# Patient Record
Sex: Male | Born: 1935 | Race: White | Hispanic: No | Marital: Married | Smoking: Former smoker
Health system: Southern US, Community
[De-identification: ages and names within clinical notes are randomized; demographics above are authoritative.]

## PROBLEM LIST (undated history)

## (undated) DIAGNOSIS — I272 Pulmonary hypertension, unspecified: Secondary | ICD-10-CM

## (undated) DIAGNOSIS — J449 Chronic obstructive pulmonary disease, unspecified: Secondary | ICD-10-CM

## (undated) DIAGNOSIS — K219 Gastro-esophageal reflux disease without esophagitis: Secondary | ICD-10-CM

## (undated) DIAGNOSIS — IMO0002 Reserved for concepts with insufficient information to code with codable children: Secondary | ICD-10-CM

## (undated) DIAGNOSIS — I4891 Unspecified atrial fibrillation: Secondary | ICD-10-CM

## (undated) DIAGNOSIS — I071 Rheumatic tricuspid insufficiency: Secondary | ICD-10-CM

## (undated) DIAGNOSIS — N401 Enlarged prostate with lower urinary tract symptoms: Secondary | ICD-10-CM

## (undated) DIAGNOSIS — I2510 Atherosclerotic heart disease of native coronary artery without angina pectoris: Secondary | ICD-10-CM

## (undated) DIAGNOSIS — N138 Other obstructive and reflux uropathy: Secondary | ICD-10-CM

## (undated) DIAGNOSIS — C439 Malignant melanoma of skin, unspecified: Secondary | ICD-10-CM

## (undated) DIAGNOSIS — I4510 Unspecified right bundle-branch block: Secondary | ICD-10-CM

## (undated) DIAGNOSIS — J189 Pneumonia, unspecified organism: Secondary | ICD-10-CM

## (undated) DIAGNOSIS — M1990 Unspecified osteoarthritis, unspecified site: Secondary | ICD-10-CM

## (undated) DIAGNOSIS — E785 Hyperlipidemia, unspecified: Secondary | ICD-10-CM

## (undated) DIAGNOSIS — R9430 Abnormal result of cardiovascular function study, unspecified: Secondary | ICD-10-CM

## (undated) DIAGNOSIS — R972 Elevated prostate specific antigen [PSA]: Secondary | ICD-10-CM

## (undated) DIAGNOSIS — F5104 Psychophysiologic insomnia: Secondary | ICD-10-CM

## (undated) DIAGNOSIS — I872 Venous insufficiency (chronic) (peripheral): Secondary | ICD-10-CM

## (undated) DIAGNOSIS — J209 Acute bronchitis, unspecified: Secondary | ICD-10-CM

## (undated) DIAGNOSIS — J986 Disorders of diaphragm: Secondary | ICD-10-CM

## (undated) DIAGNOSIS — I050 Rheumatic mitral stenosis: Secondary | ICD-10-CM

## (undated) DIAGNOSIS — I350 Nonrheumatic aortic (valve) stenosis: Secondary | ICD-10-CM

## (undated) DIAGNOSIS — K5730 Diverticulosis of large intestine without perforation or abscess without bleeding: Secondary | ICD-10-CM

## (undated) DIAGNOSIS — I10 Essential (primary) hypertension: Secondary | ICD-10-CM

## (undated) DIAGNOSIS — R609 Edema, unspecified: Secondary | ICD-10-CM

## (undated) DIAGNOSIS — I4892 Unspecified atrial flutter: Secondary | ICD-10-CM

## (undated) HISTORY — DX: Disorders of diaphragm: J98.6

## (undated) HISTORY — DX: Diverticulosis of large intestine without perforation or abscess without bleeding: K57.30

## (undated) HISTORY — DX: Unspecified atrial flutter: I48.92

## (undated) HISTORY — DX: Rheumatic tricuspid insufficiency: I07.1

## (undated) HISTORY — DX: Other obstructive and reflux uropathy: N13.8

## (undated) HISTORY — DX: Benign prostatic hyperplasia with lower urinary tract symptoms: N40.1

## (undated) HISTORY — DX: Venous insufficiency (chronic) (peripheral): I87.2

## (undated) HISTORY — DX: Unspecified osteoarthritis, unspecified site: M19.90

## (undated) HISTORY — DX: Abnormal result of cardiovascular function study, unspecified: R94.30

## (undated) HISTORY — DX: Malignant melanoma of skin, unspecified: C43.9

## (undated) HISTORY — DX: Psychophysiologic insomnia: F51.04

## (undated) HISTORY — DX: Chronic obstructive pulmonary disease, unspecified: J44.9

## (undated) HISTORY — DX: Unspecified right bundle-branch block: I45.10

## (undated) HISTORY — DX: Hyperlipidemia, unspecified: E78.5

## (undated) HISTORY — DX: Rheumatic mitral stenosis: I05.0

## (undated) HISTORY — DX: Acute bronchitis, unspecified: J20.9

## (undated) HISTORY — DX: Reserved for concepts with insufficient information to code with codable children: IMO0002

## (undated) HISTORY — DX: Pulmonary hypertension, unspecified: I27.20

## (undated) HISTORY — DX: Edema, unspecified: R60.9

## (undated) HISTORY — DX: Elevated prostate specific antigen (PSA): R97.20

## (undated) HISTORY — DX: Nonrheumatic aortic (valve) stenosis: I35.0

## (undated) HISTORY — PX: OTHER SURGICAL HISTORY: SHX169

## (undated) HISTORY — PX: UMBILICAL HERNIA REPAIR: SHX196

## (undated) HISTORY — DX: Essential (primary) hypertension: I10

## (undated) HISTORY — DX: Gastro-esophageal reflux disease without esophagitis: K21.9

## (undated) HISTORY — DX: Atherosclerotic heart disease of native coronary artery without angina pectoris: I25.10

---

## 1999-05-02 ENCOUNTER — Other Ambulatory Visit: Admission: RE | Admit: 1999-05-02 | Discharge: 1999-05-02 | Payer: Self-pay | Admitting: Urology

## 2001-07-16 ENCOUNTER — Encounter: Payer: Self-pay | Admitting: Emergency Medicine

## 2001-07-16 ENCOUNTER — Emergency Department (HOSPITAL_COMMUNITY): Admission: EM | Admit: 2001-07-16 | Discharge: 2001-07-16 | Payer: Self-pay | Admitting: Emergency Medicine

## 2001-08-20 ENCOUNTER — Emergency Department (HOSPITAL_COMMUNITY): Admission: EM | Admit: 2001-08-20 | Discharge: 2001-08-20 | Payer: Self-pay | Admitting: Emergency Medicine

## 2001-08-31 ENCOUNTER — Ambulatory Visit (HOSPITAL_BASED_OUTPATIENT_CLINIC_OR_DEPARTMENT_OTHER): Admission: RE | Admit: 2001-08-31 | Discharge: 2001-09-01 | Payer: Self-pay | Admitting: *Deleted

## 2002-01-03 ENCOUNTER — Encounter: Payer: Self-pay | Admitting: Pulmonary Disease

## 2002-01-03 ENCOUNTER — Ambulatory Visit (HOSPITAL_COMMUNITY): Admission: RE | Admit: 2002-01-03 | Discharge: 2002-01-03 | Payer: Self-pay | Admitting: Pulmonary Disease

## 2002-02-08 ENCOUNTER — Ambulatory Visit (HOSPITAL_COMMUNITY): Admission: RE | Admit: 2002-02-08 | Discharge: 2002-02-08 | Payer: Self-pay | Admitting: *Deleted

## 2002-02-08 ENCOUNTER — Encounter: Payer: Self-pay | Admitting: *Deleted

## 2002-03-06 ENCOUNTER — Ambulatory Visit (HOSPITAL_COMMUNITY): Admission: RE | Admit: 2002-03-06 | Discharge: 2002-03-08 | Payer: Self-pay | Admitting: Cardiology

## 2002-03-07 ENCOUNTER — Encounter: Payer: Self-pay | Admitting: Cardiology

## 2002-03-07 ENCOUNTER — Encounter: Payer: Self-pay | Admitting: Internal Medicine

## 2002-04-12 ENCOUNTER — Encounter: Payer: Self-pay | Admitting: Pulmonary Disease

## 2002-04-12 ENCOUNTER — Ambulatory Visit (HOSPITAL_COMMUNITY): Admission: RE | Admit: 2002-04-12 | Discharge: 2002-04-12 | Payer: Self-pay | Admitting: Pulmonary Disease

## 2004-06-09 ENCOUNTER — Ambulatory Visit: Payer: Self-pay | Admitting: Pulmonary Disease

## 2004-06-23 ENCOUNTER — Ambulatory Visit: Payer: Self-pay | Admitting: Pulmonary Disease

## 2004-07-07 ENCOUNTER — Ambulatory Visit: Payer: Self-pay | Admitting: Pulmonary Disease

## 2004-08-18 ENCOUNTER — Ambulatory Visit: Payer: Self-pay | Admitting: Pulmonary Disease

## 2004-09-01 ENCOUNTER — Ambulatory Visit: Payer: Self-pay | Admitting: Cardiology

## 2004-09-23 ENCOUNTER — Ambulatory Visit: Payer: Self-pay | Admitting: Cardiology

## 2004-09-30 ENCOUNTER — Ambulatory Visit: Payer: Self-pay | Admitting: Cardiology

## 2004-10-13 ENCOUNTER — Ambulatory Visit: Payer: Self-pay | Admitting: Pulmonary Disease

## 2005-01-08 ENCOUNTER — Ambulatory Visit: Payer: Self-pay | Admitting: Cardiology

## 2005-01-22 ENCOUNTER — Ambulatory Visit: Payer: Self-pay | Admitting: Pulmonary Disease

## 2005-04-27 ENCOUNTER — Ambulatory Visit: Payer: Self-pay | Admitting: Pulmonary Disease

## 2005-07-21 ENCOUNTER — Ambulatory Visit: Payer: Self-pay | Admitting: Pulmonary Disease

## 2005-07-29 ENCOUNTER — Ambulatory Visit: Payer: Self-pay | Admitting: Pulmonary Disease

## 2006-01-11 ENCOUNTER — Ambulatory Visit: Payer: Self-pay | Admitting: Cardiology

## 2006-01-18 ENCOUNTER — Ambulatory Visit: Payer: Self-pay | Admitting: Pulmonary Disease

## 2006-05-19 ENCOUNTER — Ambulatory Visit: Payer: Self-pay | Admitting: Pulmonary Disease

## 2006-07-16 ENCOUNTER — Ambulatory Visit: Payer: Self-pay | Admitting: Pulmonary Disease

## 2006-07-20 ENCOUNTER — Ambulatory Visit: Payer: Self-pay | Admitting: Pulmonary Disease

## 2006-07-20 LAB — CONVERTED CEMR LAB
ALT: 20 units/L (ref 0–40)
AST: 22 units/L (ref 0–37)
Albumin: 3.4 g/dL — ABNORMAL LOW (ref 3.5–5.2)
Alkaline Phosphatase: 57 units/L (ref 39–117)
BUN: 20 mg/dL (ref 6–23)
Basophils Absolute: 0 10*3/uL (ref 0.0–0.1)
Basophils Relative: 0.1 % (ref 0.0–1.0)
CO2: 25 meq/L (ref 19–32)
Calcium: 8.6 mg/dL (ref 8.4–10.5)
Chloride: 104 meq/L (ref 96–112)
Chol/HDL Ratio, serum: 3.7
Cholesterol: 154 mg/dL (ref 0–200)
Creatinine, Ser: 0.9 mg/dL (ref 0.4–1.5)
Eosinophil percent: 4.9 % (ref 0.0–5.0)
GFR calc non Af Amer: 88 mL/min
Glomerular Filtration Rate, Af Am: 107 mL/min/{1.73_m2}
Glucose, Bld: 91 mg/dL (ref 70–99)
HCT: 46.8 % (ref 39.0–52.0)
HDL: 42.1 mg/dL (ref >39.0)
Hemoglobin: 15.7 g/dL (ref 13.0–17.0)
LDL Cholesterol: 105 mg/dL — ABNORMAL HIGH (ref 0–99)
Lymphocytes Relative: 17.2 % (ref 12.0–46.0)
MCHC: 33.6 g/dL (ref 30.0–36.0)
MCV: 88.9 fL (ref 78.0–100.0)
Monocytes Absolute: 0.7 10*3/uL (ref 0.2–0.7)
Monocytes Relative: 13.1 % — ABNORMAL HIGH (ref 3.0–11.0)
Neutro Abs: 3.2 10*3/uL (ref 1.4–7.7)
Neutrophils Relative %: 64.7 % (ref 43.0–77.0)
PSA: 7.59 ng/mL — ABNORMAL HIGH (ref 0.10–4.00)
Platelets: 189 10*3/uL (ref 150–400)
Potassium: 3.9 meq/L (ref 3.5–5.1)
RBC: 5.27 M/uL (ref 4.22–5.81)
RDW: 12.5 % (ref 11.5–14.6)
Sodium: 136 meq/L (ref 135–145)
TSH: 4.47 microintl units/mL (ref 0.35–5.50)
Total Bilirubin: 0.9 mg/dL (ref 0.3–1.2)
Total Protein: 5.7 g/dL — ABNORMAL LOW (ref 6.0–8.3)
Triglyceride fasting, serum: 36 mg/dL (ref 0–149)
VLDL: 7 mg/dL (ref 0–40)
WBC: 5 10*3/uL (ref 4.5–10.5)

## 2006-09-09 ENCOUNTER — Ambulatory Visit: Payer: Self-pay | Admitting: Pulmonary Disease

## 2006-09-20 ENCOUNTER — Ambulatory Visit: Payer: Self-pay | Admitting: Pulmonary Disease

## 2006-10-05 ENCOUNTER — Ambulatory Visit: Payer: Self-pay | Admitting: Pulmonary Disease

## 2007-01-11 ENCOUNTER — Ambulatory Visit: Payer: Self-pay | Admitting: Cardiology

## 2007-01-17 ENCOUNTER — Ambulatory Visit: Payer: Self-pay | Admitting: Pulmonary Disease

## 2007-01-18 ENCOUNTER — Ambulatory Visit: Payer: Self-pay | Admitting: Pulmonary Disease

## 2007-01-18 LAB — CONVERTED CEMR LAB
ALT: 22 units/L (ref 0–40)
AST: 22 units/L (ref 0–37)
Albumin: 3.6 g/dL (ref 3.5–5.2)
Alkaline Phosphatase: 51 units/L (ref 39–117)
BUN: 14 mg/dL (ref 6–23)
Basophils Absolute: 0 10*3/uL (ref 0.0–0.1)
Basophils Relative: 0.1 % (ref 0.0–1.0)
Bilirubin, Direct: 0.2 mg/dL (ref 0.0–0.3)
CO2: 29 meq/L (ref 19–32)
Calcium: 9 mg/dL (ref 8.4–10.5)
Chloride: 105 meq/L (ref 96–112)
Cholesterol: 180 mg/dL (ref 0–200)
Creatinine, Ser: 0.7 mg/dL (ref 0.4–1.5)
Eosinophils Absolute: 0.2 10*3/uL (ref 0.0–0.6)
Eosinophils Relative: 3.7 % (ref 0.0–5.0)
GFR calc Af Amer: 143 mL/min
GFR calc non Af Amer: 118 mL/min
Glucose, Bld: 93 mg/dL (ref 70–99)
HCT: 43.7 % (ref 39.0–52.0)
HDL: 52 mg/dL (ref >39.0)
Hemoglobin: 15 g/dL (ref 13.0–17.0)
LDL Cholesterol: 121 mg/dL — ABNORMAL HIGH (ref 0–99)
Lymphocytes Relative: 11.2 % — ABNORMAL LOW (ref 12.0–46.0)
MCHC: 34.3 g/dL (ref 30.0–36.0)
MCV: 88 fL (ref 78.0–100.0)
Monocytes Absolute: 0.6 10*3/uL (ref 0.2–0.7)
Monocytes Relative: 9.7 % (ref 3.0–11.0)
Neutro Abs: 5 10*3/uL (ref 1.4–7.7)
Neutrophils Relative %: 75.3 % (ref 43.0–77.0)
Platelets: 191 10*3/uL (ref 150–400)
Potassium: 4.4 meq/L (ref 3.5–5.1)
Pro B Natriuretic peptide (BNP): 114 pg/mL — ABNORMAL HIGH (ref 0.0–100.0)
RBC: 4.97 M/uL (ref 4.22–5.81)
RDW: 12 % (ref 11.5–14.6)
Sodium: 141 meq/L (ref 135–145)
TSH: 2.72 microintl units/mL (ref 0.35–5.50)
Total Bilirubin: 0.9 mg/dL (ref 0.3–1.2)
Total CHOL/HDL Ratio: 3.5
Total Protein: 5.6 g/dL — ABNORMAL LOW (ref 6.0–8.3)
Triglycerides: 37 mg/dL (ref 0–149)
VLDL: 7 mg/dL (ref 0–40)
WBC: 6.5 10*3/uL (ref 4.5–10.5)

## 2007-04-06 ENCOUNTER — Ambulatory Visit: Payer: Self-pay | Admitting: Pulmonary Disease

## 2007-05-12 ENCOUNTER — Ambulatory Visit: Payer: Self-pay | Admitting: Pulmonary Disease

## 2007-06-24 ENCOUNTER — Encounter: Payer: Self-pay | Admitting: Pulmonary Disease

## 2007-07-18 ENCOUNTER — Ambulatory Visit: Payer: Self-pay | Admitting: Pulmonary Disease

## 2007-07-24 DIAGNOSIS — M1990 Unspecified osteoarthritis, unspecified site: Secondary | ICD-10-CM | POA: Insufficient documentation

## 2007-07-24 DIAGNOSIS — J209 Acute bronchitis, unspecified: Secondary | ICD-10-CM | POA: Insufficient documentation

## 2007-07-24 DIAGNOSIS — R972 Elevated prostate specific antigen [PSA]: Secondary | ICD-10-CM | POA: Insufficient documentation

## 2007-07-24 DIAGNOSIS — G4700 Insomnia, unspecified: Secondary | ICD-10-CM | POA: Insufficient documentation

## 2007-07-24 DIAGNOSIS — K5730 Diverticulosis of large intestine without perforation or abscess without bleeding: Secondary | ICD-10-CM | POA: Insufficient documentation

## 2007-07-24 DIAGNOSIS — K219 Gastro-esophageal reflux disease without esophagitis: Secondary | ICD-10-CM

## 2007-07-24 DIAGNOSIS — N401 Enlarged prostate with lower urinary tract symptoms: Secondary | ICD-10-CM

## 2007-07-24 DIAGNOSIS — J309 Allergic rhinitis, unspecified: Secondary | ICD-10-CM | POA: Insufficient documentation

## 2007-07-24 DIAGNOSIS — R609 Edema, unspecified: Secondary | ICD-10-CM | POA: Insufficient documentation

## 2007-11-01 ENCOUNTER — Ambulatory Visit: Payer: Self-pay | Admitting: Pulmonary Disease

## 2008-01-03 ENCOUNTER — Ambulatory Visit: Payer: Self-pay | Admitting: Cardiology

## 2008-01-09 ENCOUNTER — Encounter: Payer: Self-pay | Admitting: Pulmonary Disease

## 2008-01-09 ENCOUNTER — Ambulatory Visit: Payer: Self-pay

## 2008-01-12 ENCOUNTER — Ambulatory Visit: Payer: Self-pay | Admitting: Cardiology

## 2008-01-12 LAB — CONVERTED CEMR LAB
BUN: 21 mg/dL (ref 6–23)
Basophils Absolute: 0 10*3/uL (ref 0.0–0.1)
Basophils Relative: 0.1 % (ref 0.0–1.0)
CO2: 29 meq/L (ref 19–32)
Calcium: 9.2 mg/dL (ref 8.4–10.5)
Chloride: 104 meq/L (ref 96–112)
Creatinine, Ser: 0.9 mg/dL (ref 0.4–1.5)
Eosinophils Absolute: 0.2 10*3/uL (ref 0.0–0.7)
Eosinophils Relative: 2.6 % (ref 0.0–5.0)
GFR calc Af Amer: 106 mL/min
GFR calc non Af Amer: 88 mL/min
Glucose, Bld: 112 mg/dL — ABNORMAL HIGH (ref 70–99)
HCT: 42.1 % (ref 39.0–52.0)
Hemoglobin: 14.7 g/dL (ref 13.0–17.0)
INR: 1 (ref 0.8–1.0)
Lymphocytes Relative: 9.4 % — ABNORMAL LOW (ref 12.0–46.0)
MCHC: 35 g/dL (ref 30.0–36.0)
MCV: 89.6 fL (ref 78.0–100.0)
Monocytes Absolute: 0.5 10*3/uL (ref 0.1–1.0)
Monocytes Relative: 8.5 % (ref 3.0–12.0)
Neutro Abs: 5.1 10*3/uL (ref 1.4–7.7)
Neutrophils Relative %: 79.4 % — ABNORMAL HIGH (ref 43.0–77.0)
Platelets: 157 10*3/uL (ref 150–400)
Potassium: 4.3 meq/L (ref 3.5–5.1)
Prothrombin Time: 11.9 s (ref 10.9–13.3)
RBC: 4.7 M/uL (ref 4.22–5.81)
RDW: 12.2 % (ref 11.5–14.6)
Sodium: 139 meq/L (ref 135–145)
WBC: 6.4 10*3/uL (ref 4.5–10.5)
aPTT: 34.7 s — ABNORMAL HIGH (ref 21.7–29.8)

## 2008-01-17 ENCOUNTER — Ambulatory Visit: Payer: Self-pay

## 2008-01-17 ENCOUNTER — Ambulatory Visit: Payer: Self-pay | Admitting: Pulmonary Disease

## 2008-01-17 ENCOUNTER — Encounter: Payer: Self-pay | Admitting: Cardiology

## 2008-01-17 LAB — CONVERTED CEMR LAB
ALT: 22 units/L (ref 0–53)
Basophils Relative: 0.1 % (ref 0.0–1.0)
CO2: 27 meq/L (ref 19–32)
Calcium: 9.5 mg/dL (ref 8.4–10.5)
Cholesterol: 179 mg/dL (ref 0–200)
Creatinine, Ser: 0.9 mg/dL (ref 0.4–1.5)
Glucose, Bld: 100 mg/dL — ABNORMAL HIGH (ref 70–99)
Hemoglobin: 14.9 g/dL (ref 13.0–17.0)
LDL Cholesterol: 123 mg/dL — ABNORMAL HIGH (ref 0–99)
Lymphocytes Relative: 9.3 % — ABNORMAL LOW (ref 12.0–46.0)
MCHC: 34.1 g/dL (ref 30.0–36.0)
Monocytes Relative: 9.2 % (ref 3.0–12.0)
Neutro Abs: 5.7 10*3/uL (ref 1.4–7.7)
RBC: 4.9 M/uL (ref 4.22–5.81)
Sed Rate: 5 mm/hr (ref 0–16)
TSH: 1.46 microintl units/mL (ref 0.35–5.50)
Total Protein: 6.3 g/dL (ref 6.0–8.3)

## 2008-01-18 ENCOUNTER — Ambulatory Visit: Payer: Self-pay | Admitting: Cardiology

## 2008-01-18 ENCOUNTER — Inpatient Hospital Stay (HOSPITAL_BASED_OUTPATIENT_CLINIC_OR_DEPARTMENT_OTHER): Admission: RE | Admit: 2008-01-18 | Discharge: 2008-01-18 | Payer: Self-pay | Admitting: Cardiology

## 2008-01-30 ENCOUNTER — Ambulatory Visit: Payer: Self-pay | Admitting: Cardiology

## 2008-03-13 ENCOUNTER — Ambulatory Visit: Payer: Self-pay | Admitting: Pulmonary Disease

## 2008-03-13 DIAGNOSIS — J986 Disorders of diaphragm: Secondary | ICD-10-CM | POA: Insufficient documentation

## 2008-04-11 ENCOUNTER — Telehealth: Payer: Self-pay | Admitting: Pulmonary Disease

## 2008-04-11 ENCOUNTER — Ambulatory Visit: Payer: Self-pay | Admitting: Pulmonary Disease

## 2008-04-11 DIAGNOSIS — S8010XA Contusion of unspecified lower leg, initial encounter: Secondary | ICD-10-CM

## 2008-04-17 ENCOUNTER — Ambulatory Visit: Payer: Self-pay | Admitting: Cardiology

## 2008-04-17 ENCOUNTER — Encounter: Payer: Self-pay | Admitting: Pulmonary Disease

## 2008-04-17 ENCOUNTER — Ambulatory Visit: Payer: Self-pay

## 2008-04-25 ENCOUNTER — Ambulatory Visit: Payer: Self-pay | Admitting: Pulmonary Disease

## 2008-07-17 ENCOUNTER — Ambulatory Visit: Payer: Self-pay | Admitting: Internal Medicine

## 2008-07-25 ENCOUNTER — Telehealth (INDEPENDENT_AMBULATORY_CARE_PROVIDER_SITE_OTHER): Payer: Self-pay | Admitting: *Deleted

## 2008-09-04 ENCOUNTER — Ambulatory Visit: Payer: Self-pay | Admitting: Pulmonary Disease

## 2008-11-15 ENCOUNTER — Ambulatory Visit: Payer: Self-pay | Admitting: Pulmonary Disease

## 2008-11-18 LAB — CONVERTED CEMR LAB
AST: 27 units/L (ref 0–37)
Albumin: 3.8 g/dL (ref 3.5–5.2)
Basophils Absolute: 0 10*3/uL (ref 0.0–0.1)
Basophils Relative: 0.4 % (ref 0.0–3.0)
CO2: 29 meq/L (ref 19–32)
GFR calc non Af Amer: 87.53 mL/min (ref >60)
Glucose, Bld: 94 mg/dL (ref 70–99)
HCT: 41.7 % (ref 39.0–52.0)
Hemoglobin: 14.2 g/dL (ref 13.0–17.0)
Lymphs Abs: 0.5 10*3/uL — ABNORMAL LOW (ref 0.7–4.0)
MCHC: 34.1 g/dL (ref 30.0–36.0)
Monocytes Relative: 8 % (ref 3.0–12.0)
Neutro Abs: 5.7 10*3/uL (ref 1.4–7.7)
Potassium: 4 meq/L (ref 3.5–5.1)
RDW: 12.6 % (ref 11.5–14.6)
Sodium: 142 meq/L (ref 135–145)
TSH: 1.96 microintl units/mL (ref 0.35–5.50)
Total Protein: 6 g/dL (ref 6.0–8.3)

## 2009-01-31 ENCOUNTER — Telehealth: Payer: Self-pay | Admitting: Pulmonary Disease

## 2009-01-31 ENCOUNTER — Ambulatory Visit: Payer: Self-pay | Admitting: Pulmonary Disease

## 2009-02-01 ENCOUNTER — Encounter: Payer: Self-pay | Admitting: Adult Health

## 2009-02-01 ENCOUNTER — Encounter: Payer: Self-pay | Admitting: Pulmonary Disease

## 2009-02-01 ENCOUNTER — Inpatient Hospital Stay (HOSPITAL_COMMUNITY): Admission: AD | Admit: 2009-02-01 | Discharge: 2009-02-03 | Payer: Self-pay | Admitting: Orthopedic Surgery

## 2009-02-13 ENCOUNTER — Encounter: Payer: Self-pay | Admitting: Pulmonary Disease

## 2009-03-07 ENCOUNTER — Ambulatory Visit: Payer: Self-pay | Admitting: Pulmonary Disease

## 2009-04-16 ENCOUNTER — Encounter: Payer: Self-pay | Admitting: Cardiology

## 2009-04-17 ENCOUNTER — Ambulatory Visit: Payer: Self-pay | Admitting: Cardiology

## 2009-05-01 ENCOUNTER — Ambulatory Visit: Payer: Self-pay | Admitting: Pulmonary Disease

## 2009-05-09 ENCOUNTER — Telehealth: Payer: Self-pay | Admitting: Pulmonary Disease

## 2009-05-14 ENCOUNTER — Telehealth: Payer: Self-pay | Admitting: Cardiology

## 2009-08-19 ENCOUNTER — Ambulatory Visit: Payer: Self-pay | Admitting: Pulmonary Disease

## 2009-08-25 LAB — CONVERTED CEMR LAB
ALT: 24 units/L (ref 0–53)
AST: 27 units/L (ref 0–37)
Alkaline Phosphatase: 49 units/L (ref 39–117)
Basophils Absolute: 0.1 10*3/uL (ref 0.0–0.1)
Bilirubin, Direct: 0.3 mg/dL (ref 0.0–0.3)
CO2: 26 meq/L (ref 19–32)
Chloride: 107 meq/L (ref 96–112)
Eosinophils Absolute: 0.2 10*3/uL (ref 0.0–0.7)
Lymphocytes Relative: 9.8 % — ABNORMAL LOW (ref 12.0–46.0)
MCHC: 33.8 g/dL (ref 30.0–36.0)
MCV: 91.2 fL (ref 78.0–100.0)
Monocytes Absolute: 0.6 10*3/uL (ref 0.1–1.0)
Neutrophils Relative %: 78 % — ABNORMAL HIGH (ref 43.0–77.0)
Potassium: 4.3 meq/L (ref 3.5–5.1)
RDW: 12.6 % (ref 11.5–14.6)
Sodium: 144 meq/L (ref 135–145)
Total Bilirubin: 1.5 mg/dL — ABNORMAL HIGH (ref 0.3–1.2)
Total CHOL/HDL Ratio: 4
Total Protein: 6.4 g/dL (ref 6.0–8.3)
Triglycerides: 38 mg/dL (ref 0.0–149.0)

## 2009-10-09 ENCOUNTER — Ambulatory Visit: Payer: Self-pay | Admitting: Pulmonary Disease

## 2009-10-09 DIAGNOSIS — I10 Essential (primary) hypertension: Secondary | ICD-10-CM | POA: Insufficient documentation

## 2009-10-14 LAB — CONVERTED CEMR LAB
BUN: 17 mg/dL (ref 6–23)
Calcium: 9.1 mg/dL (ref 8.4–10.5)
GFR calc non Af Amer: 100.03 mL/min (ref >60)
Glucose, Bld: 93 mg/dL (ref 70–99)
Potassium: 4.2 meq/L (ref 3.5–5.1)
Sodium: 143 meq/L (ref 135–145)

## 2009-11-12 ENCOUNTER — Ambulatory Visit: Payer: Self-pay | Admitting: Internal Medicine

## 2009-12-02 ENCOUNTER — Ambulatory Visit: Payer: Self-pay | Admitting: Pulmonary Disease

## 2009-12-02 DIAGNOSIS — K5900 Constipation, unspecified: Secondary | ICD-10-CM | POA: Insufficient documentation

## 2010-01-30 ENCOUNTER — Ambulatory Visit: Payer: Self-pay | Admitting: Pulmonary Disease

## 2010-02-24 ENCOUNTER — Telehealth: Payer: Self-pay | Admitting: Pulmonary Disease

## 2010-04-03 ENCOUNTER — Ambulatory Visit: Payer: Self-pay | Admitting: Cardiology

## 2010-05-12 ENCOUNTER — Ambulatory Visit: Payer: Self-pay | Admitting: Pulmonary Disease

## 2010-05-19 ENCOUNTER — Encounter: Payer: Self-pay | Admitting: Adult Health

## 2010-07-31 ENCOUNTER — Ambulatory Visit: Payer: Self-pay | Admitting: Pulmonary Disease

## 2010-08-04 LAB — CONVERTED CEMR LAB
ALT: 29 units/L (ref 0–53)
Albumin: 3.9 g/dL (ref 3.5–5.2)
Basophils Absolute: 0 10*3/uL (ref 0.0–0.1)
Bilirubin, Direct: 0.3 mg/dL (ref 0.0–0.3)
CO2: 28 meq/L (ref 19–32)
Calcium: 8.9 mg/dL (ref 8.4–10.5)
Chloride: 100 meq/L (ref 96–112)
Creatinine, Ser: 0.8 mg/dL (ref 0.4–1.5)
Eosinophils Absolute: 0.2 10*3/uL (ref 0.0–0.7)
Glucose, Bld: 70 mg/dL (ref 70–99)
HDL: 55.7 mg/dL (ref >39.00)
Hemoglobin: 14.5 g/dL (ref 13.0–17.0)
Lymphocytes Relative: 9.1 % — ABNORMAL LOW (ref 12.0–46.0)
MCHC: 34 g/dL (ref 30.0–36.0)
Monocytes Relative: 8.3 % (ref 3.0–12.0)
Neutrophils Relative %: 79.8 % — ABNORMAL HIGH (ref 43.0–77.0)
RBC: 4.67 M/uL (ref 4.22–5.81)
RDW: 13.4 % (ref 11.5–14.6)
Total Protein: 6.5 g/dL (ref 6.0–8.3)
Triglycerides: 35 mg/dL (ref 0.0–149.0)
VLDL: 7 mg/dL (ref 0.0–40.0)

## 2010-09-02 NOTE — Assessment & Plan Note (Signed)
Summary: ROV/MHH   Primary Care Provider:  Kriste Basque, MD  CC:  5-6 month ROV & review of mult medical problems....  History of Present Illness: 77 y/o WM here for an add-on visit... he has mult medical problems as noted below...    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 he has been eval by [facility] & Myoview showed decreased BP w/ exercise, ? of slight ischemia, EF= 64%... 2DEcho 6/16 showed mod calcif AoV w/ reduced leaflet excursion c/w mild AS, mod mitral annular calcif w/ mild MS, dil LA & RV, mild incr PA sys ~ 40, norm LVF w/ EF=55-60%... cath 6/17 showed 30-40% LAD, 40-50% RCA, & 20% CIRC = non-obstructive CAD... ATENOLOL increased to 50mg /d...   ~  he completed his f/u pulm eval in 2009 (see below)... 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 he has a known elevated left hemidiaphragm  and scar tissue left base w/ PFT's showing combined restrictive & obstructive defects... old CT Chest in 1997 reports "scarring in the left hemithorax and elevated left hemidiaphragm without change from old films back to 1983"...   ~  March 07, 2009:  routine f/u visit doing reasonably well without new complaints or concerns, but he describes an episode of choking several weeks ago while eating fruit salad at [employer]- "coughed up 3 wads of thick phlegm & a tiny piece of appleskin"...  he also had a cat bite w/ cellulitis right hand req 3d in hosp for IV antibiotics by DrSypher...   ~  August 19, 2009:  generally stable- he had a good Christmas- no new complaints or concerns...  saw DrKatz 9/10- known nonobstructive CAD, mild AS & min MS, RBBB, hx AFlutter ablation> no change & f/u 79yr planned...     Current Problem List:  ALLERGIC RHINITIS (ICD-477.9) - uses OTC antihist Prn & Flonase spray...  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 stable on ADVAIR 500 Prn, ALBUTEROL Prn, MUCINEX 2Bid Prn... otherwise doing well, mows yard etc... still says he needs the TUSSIONEX for cough...   ~  SCANS:  9/03= elev L hemidaiph w/ scarring at base, & RLL infiltrate resolved...  ~  baseline CXR 2/08 - s/p median sternotomy (mediastinal melanoma surg 1980's), elev L hemidiaph & chr changes, NAD.Marland Kitchen.  ~  f/u CXR 03/13/08 = similar chr changes on the left...  ~  PFT 1/06 - FVC 2.46 (54%), FEV1= 1.64 (46%), %1sec=67, mid-flows=20%. FEV1 in 1998 was 1.89.  ~  PFT 8/09 showed FVC= 2.61 (58%), FEV1= 1.73 (49%), %1sec=66, mid-flows= 29%...  ~  CXR 1/11 showed stable COPD, NAD...  CORONARY ARTERY DISEASE (ICD-414.00) - takes ATENOLOL 50mg /d, and ASA 81mg /d...   ~   min non-obstructive CAD on cath 7/03 w/ luminal irregs to 30% in Circ...  ~  cath 6/09 showed 30-40% LAD, 40-50% RCA, & 20% CIRC = non-obstructive CAD  ~  last OV DrKatz yearly-  seen 9/10 & stable.  RIGHT BUNDLE BRANCH BLOCK (ICD-426.4) - baseline EKG is NSR, RBBB...  VALVULAR HEART DISEASE (ICD-424.90) & Hx of FLUTTER, ATRIAL (ICD-427.32) - s/p cath ablation in 2003 by DrTaylor...  ~  2DEcho 2/06 showed conc LVH, sclerotic AoV w/o AS, low-norm LVF...  ~  2DEcho 6/09 showed mod calcif AoV  w/ reduced leaflet excursion c/w mild AS, mod mitral annular calcif w/ mild MS, dil LA & RV, mild incr PA sys ~ 40, norm LVF w/ EF=55-60%  VENOUS INSUFFICIENCY (ICD-459.81) w/ EDEMA (ICD-782.3) - treats w/ low sodium diet, not currently on diuretic Rx... BNP= 114 in Jun08... exam shows superfic VV, no signif edema, etc...  HYPERCHOLESTEROLEMIA (ICD-272.0) - on diet + FISH OIL...  ~  FLP 6/08 showed TChol 180, TG 37, HDL 52, LDL 121... he was not interested in Statin therapy.  ~  FLP 4/10 showed TChol 189, TG 34, HDL 53, LDL 130... not at goal- needs meds/ he prefers diet.  ~  FLP 1/11 showed TChol 195, TG 38, HDL 54, LDL 133... needs  better diet.  GERD (ICD-530.81) - on OMEPRAZOLE 20mg /d, and ZANTAC 150mg Qhs...  DIVERTICULOSIS OF COLON (ICD-562.10) -   ~  last colonoscopy 2/05 by DrPatterson showed divertics only...   HYPERTROPHY PROSTATE W/UR OBST & OTH LUTS (ICD-600.01) - treated by DrPeterson w/ AVODART 0.5mg /d, FLOMAX 0.4mg Bid, & SANCTURA 20mg Bid...  PSA, INCREASED (ICD-790.93) - pt states that PSA was as high as 9 and improved to 5.6.Marland KitchenMarland Kitchen pt reports recent eval for hematuria- note pending... his PSA 10/10 from DrPeterson was 5.3 by his hx.  DEGENERATIVE JOINT DISEASE (ICD-715.90) - on MOBIC 7.5mg /d...   INSOMNIA, CHRONIC (ICD-307.42) - uses AMBIEN 10mg  Qhs...  MALIGNANT MELANOMA, HX OF (ICD-V10.82) - s/p surgery of mediastinal melanoma 1980's... no known recurrence...    Allergies: 1)  ! * Ivp Dye  Comments:  Nurse/Medical Assistant: The patient's medications and allergies were reviewed with the patient and were updated in the Medication and Allergy Lists.  Past History:  Past Medical History: ALLERGIC RHINITIS (ICD-477.9) COPD (ICD-496) DISORDERS OF DIAPHRAGM (ICD-519.4) Hx of BRONCHITIS, ACUTE (ICD-466.0) CORONARY ARTERY DISEASE (ICD-414.00).... mild.. by catheterization in June, 2009. .. this catheterization followed a nuclear scan with question of mild lateral ischemia and a hypotensive response to exercise. Ejection fraction 55-60%... echo. june, 2009 Aortic stenosis.. mild.Marland Kitchen echo and catheterization June, 2009 Mitral stenosis...?????very mild by echo related to mitral annular calcification RIGHT BUNDLE BRANCH BLOCK (ICD-426.4) VALVULAR HEART DISEASE (ICD-424.90) Hx of FLUTTER, ATRIAL (ICD-427.32)....ablated in the past with no recurrence VENOUS INSUFFICIENCY (ICD-459.81) EDEMA (ICD-782.3) HYPERCHOLESTEROLEMIA (ICD-272.0)...patient hesitant to use statin. GERD (ICD-530.81) DIVERTICULOSIS OF COLON (ICD-562.10) HYPERTROPHY PROSTATE W/UR OBST & OTH LUTS (ICD-600.01) PSA, INCREASED  (ICD-790.93) DEGENERATIVE JOINT DISEASE (ICD-715.90) INSOMNIA, CHRONIC (ICD-307.42) MALIGNANT MELANOMA, HX OF (ICD-V10.82)..mediastinal removed via thoracotomy td booster January 31, 2009   Past Surgical History: S/P bialt inguinal hernia repairs S/P umbilical hernia repair  Family History: Reviewed history from 03/07/2009 and no changes required. Mother - arthritis Sister - lung cancer Brother - dec'd at age 26 - firefighter  Social History: Reviewed history from 03/07/2009 and no changes required. Married, wife= Evelyn 6 children- 2 biological, 4 adopted ex-smoker, quit 40 yrs social alcohol retired [employer], [employer]...  Review of Systems      See HPI  Vital Signs:  Patient profile:   77 year old male Height:      71 inches Weight:      193 pounds BMI:     27.02 O2 Sat:      92 % on Room air Temp:     97.4 degrees F oral Pulse rate:   85 / minute BP sitting:   152 / 88  (right arm) Cuff size:   regular  Vitals Entered By: Randell Loop CMA (August 19, 2009 8:49 AM)  O2 Sat  at Rest %:  92 O2 Flow:  Room air CC: 5-6 month ROV & review of mult medical problems... Is Patient Diabetic? No Pain Assessment Patient in pain? no      Comments meds updated today---pt brought all meds in today   Physical Exam  Additional Exam:  WD, WN, 77 y/o WM in NAD... GENERAL:  Alert & oriented; pleasant & cooperative... HEENT:  [place]/AT, EOM-wnl, PERRLA, EACs-clear, TMs-wnl, NOSE-clear, THROAT-clear & wnl. NECK:  Supple w/ fairROM; no JVD; normal carotid impulses w/o bruits; no thyromegaly or nodules palpated; no lymphadenopathy. CHEST:  Decr BS in bases , clear without wheezes/ rales/ or rhonchi, s/p median sternotomy. HEART:  regular rhythm; gr 2/6 sys murmur, without rubs of gallops appreciated... ABDOMEN:  Soft & nontender; normal bowel sounds; no organomegaly or masses detected. EXT: without deformities, mild arthritic changes, +ven insuffic w/ superfic VV- no  c/c/edema... right hand wound healing nicely... NEURO:  CN's intact; motor testing normal; sensory testing normal; gait normal & balance OK. DERM:  no rash, no skin lesions noted...     CXR  Procedure date:  08/19/2009  Findings:      CHEST - 2 VIEW   Comparison: 03/13/2008   Findings: Cardiomediastinal silhouette is stable.  Chronic elevation of the left hemidiaphragm left basilar atelectasis or scarring again noted.  Status post median sternotomy.  Stable chronic mild interstitial prominence.  No acute infiltrate or edema.   IMPRESSION: Stable COPD.  No acute disease.  No significant change.   Read By:  Kennieth Francois,  M.D.       MISC. Report  Procedure date:  08/19/2009  Findings:      Lipid Panel (LIPID)   Cholesterol               195 mg/dL                   6-606   Triglycerides             38.0 mg/dL                  3.0-160.1   HDL                       09.32 mg/dL                 >35.57   LDL Cholesterol      [H]  322 mg/dL                   0-25  BMP (METABOL)   Sodium                    144 mEq/L                   135-145   Potassium                 4.3 mEq/L                   3.5-5.1   Chloride                  107 mEq/L                   96-112   Carbon Dioxide            26 mEq/L  19-32   Glucose                   93 mg/dL                    60-45   BUN                       18 mg/dL                    4-09   Creatinine                0.8 mg/dL                   8.1-1.9   Calcium                   9.3 mg/dL                   1.4-78.2   GFR                       100.07 mL/min               >60  Hepatic/Liver Function Panel (HEPATIC)   Total Bilirubin      [H]  1.5 mg/dL                   9.5-6.2   Direct Bilirubin          0.3 mg/dL                   1.3-0.8   Alkaline Phosphatase      49 U/L                      39-117   AST                       27 U/L                      0-37   ALT                       24 U/L                       0-53   Total Protein             6.4 g/dL                    6.5-7.8   Albumin                   4.1 g/dL                    4.6-9.6  Comments:      CBC Platelet w/Diff (CBCD)   White Cell Count          6.7 K/uL                    4.5-10.5   Red Cell Count            4.77 Mil/uL                 4.22-5.81   Hemoglobin                14.7 g/dL  13.0-17.0   Hematocrit                43.5 %                      39.0-52.0   MCV                       91.2 fl                     78.0-100.0   Platelet Count            156.0 K/uL                  150.0-400.0   Neutrophil %         [H]  78.0 %                      43.0-77.0   Lymphocyte %         [L]  9.8 %                       12.0-46.0   Monocyte %                8.9 %                       3.0-12.0   Eosinophils%              2.3 %                       0.0-5.0   Basophils %               1.0 %     TSH (TSH)   FastTSH                   2.22 uIU/mL                 0.35-5.50   Impression & Recommendations:  Problem # 1:  COPD (ICD-496) Stable-  refill meds. His updated medication list for this problem includes:    Advair Diskus 500-50 Mcg/dose Misc (Fluticasone-salmeterol) .Marland Kitchen... Take one puff twice daily    Albuterol 90 Mcg/act Aers (Albuterol) .Marland Kitchen... Take two puffs every four to six hours as needed  Orders: T-2 View CXR (71020TC)  Problem # 2:  CORONARY ARTERY DISEASE (ICD-414.00) Stable-  followed by drKatz... His updated medication list for this problem includes:    Bayer Low Strength 81 Mg Tbec (Aspirin) .Marland Kitchen... Take one pill by mouth once daily    Atenolol 25 Mg Tabs (Atenolol) .Marland Kitchen... Take 1 tablet by mouth two times a day  Problem # 3:  HYPERCHOLESTEROLEMIA (ICD-272.0) His LDL is still elevated... needs better diet OR start low dose statin... Orders: Venipuncture (62694) TLB-Lipid Panel (80061-LIPID) TLB-BMP (Basic Metabolic Panel-BMET) (80048-METABOL) TLB-Hepatic/Liver Function Pnl (80076-HEPATIC) TLB-CBC  Platelet - w/Differential (85025-CBCD) TLB-TSH (Thyroid Stimulating Hormone) (84443-TSH)  Problem # 4:  GERD (ICD-530.81) GI is stable-  same Rx. His updated medication list for this problem includes:    Omeprazole 20 Mg Tbec (Omeprazole) .Marland Kitchen... Take one pill by mouth once daily    Ranitidine Hcl 150 Mg Caps (Ranitidine hcl) .Marland Kitchen... Take 1 tablet by mouth once a day  Problem # 5:  HYPERTROPHY PROSTATE W/UR OBST & OTH LUTS (ICD-600.01) GU per DrPeterson... His updated medication list for this  problem includes:    Avodart 0.5 Mg Caps (Dutasteride) .Marland Kitchen... Take one pill by mouth once daily    Flomax 0.4 Mg Cp24 (Tamsulosin hcl) .Marland Kitchen... Take two pills by mouth once daily  Problem # 6:  OTHER MEDICAL PROBLEMS AS NOTED>>>   Complete Medication List: 1)  Fluticasone Propionate 50 Mcg/act Susp (Fluticasone propionate) .Marland Kitchen.. 1-2 sp in each nostril at bedtime.Marland KitchenMarland Kitchen 2)  Magic Mouthwash  .... 1 tsp gargle and swallow as needed... 3)  Tussionex Pennkinetic Er 8-10 Mg/35ml Lqcr (Chlorpheniramine-hydrocodone) .Marland Kitchen.. 1 tsp two times a day as needed cough 4)  Advair Diskus 500-50 Mcg/dose Misc (Fluticasone-salmeterol) .... Take one puff twice daily 5)  Albuterol 90 Mcg/act Aers (Albuterol) .... Take two puffs every four to six hours as needed 6)  Mucinex 600 Mg Xr12h-tab (Guaifenesin) .... Take 1 tablet by mouth two times a day 7)  Bayer Low Strength 81 Mg Tbec (Aspirin) .... Take one pill by mouth once daily 8)  Atenolol 25 Mg Tabs (Atenolol) .... Take 1 tablet by mouth two times a day 9)  Fish Oil 500 Mg Caps (Omega-3 fatty acids) .... Take 1 capsule by mouth once a day 10)  Omeprazole 20 Mg Tbec (Omeprazole) .... Take one pill by mouth once daily 11)  Ranitidine Hcl 150 Mg Caps (Ranitidine hcl) .... Take 1 tablet by mouth once a day 12)  Avodart 0.5 Mg Caps (Dutasteride) .... Take one pill by mouth once daily 13)  Flomax 0.4 Mg Cp24 (Tamsulosin hcl) .... Take two pills by mouth once daily 14)  Sanctura 20 Mg Tabs  (Trospium chloride) .... Take one pill by mouth twice daily 15)  Meloxicam 7.5 Mg Tabs (Meloxicam) .... Take 1 tablet by mouth every morning 16)  Ambien 10 Mg Tabs (Zolpidem tartrate) .... Take 1 tab by mouth at bedtime....  Other Orders: Prescription Created Electronically [phone]) Tdap => 18yrs IM (23557) Admin 1st Vaccine (32202) Pneumococcal Vaccine (54270) Admin of Any Addtl Vaccine (62376)  Patient Instructions: 1)  Today we updated your med list- see below.... 2)  We refilled your MMW & Flonase as requested... 3)  We gave you the PNEUMONIA vaccine, and the 56yr TDAP vaccine today.Marland Kitchen 4)  Today we did your f/u CXR & Fasting blood work... please call the "phone tree" in a few days for your lab results.Marland KitchenMarland Kitchen 5)  Stay as active as poss, and NO SALT!!! 6)  Call for any problems.Marland KitchenMarland Kitchen 7)  Please schedule a follow-up appointment in 6 months. Prescriptions: FLUTICASONE PROPIONATE 50 MCG/ACT SUSP (FLUTICASONE PROPIONATE) 1-2 sp in each nostril at bedtime...  #1 x prn   Entered and Authorized by:   Michele Mcalpine MD   Signed by:   Michele Mcalpine MD on 08/19/2009   Method used:   Print then Give to Patient   RxID:   [id number] MAGIC MOUTHWASH 1 tsp gargle and swallow as needed...  #8 oz x prn   Entered and Authorized by:   Michele Mcalpine MD   Signed by:   Michele Mcalpine MD on 08/19/2009   Method used:   Print then Give to Patient   RxID:   [id number]    Immunizations Administered:  Tetanus Vaccine:    Vaccine Type: Tdap    Site: left deltoid    Mfr: boostrix    Dose: 0.5 ml    Route: IM    Given by: Randell Loop CMA    Exp. Date: 02/01/2011    Lot #: [id number]  VIS given: 06/21/07 version given August 19, 2009.  Pneumonia Vaccine:    Vaccine Type: Pneumovax    Site: right deltoid    Mfr: Merck    Dose: 0.5 ml    Route: IM    Given by: Randell Loop CMA    Exp. Date: 08/29/2010    Lot #: [id number]    VIS given: 02/29/96 version given August 19, 2009.

## 2010-09-02 NOTE — Assessment & Plan Note (Signed)
Summary: 6 month follow up/rsc 7-12--pt here at 2pm/la   Primary Care Provider:  Kriste Basque, MD  CC:  6 month ROV & review....  History of Present Illness: 77 y/o WM here for an add-on visit... he has mult medical problems as noted below...  Followed for general medical purposes w/ hx COPD, ex-smoker, elev right hemidiaph, CAD & mild AS followed by Delton See, Hx AFlutter s/p ablation in 2003 by DrTaylor, Hypercholesterolemia on diet alone, BPH on 3 meds per DrPeterson, and hx MM in mediastinum 1980's (no recurrence)...   ~  August 19, 2009:  generally stable- he had a good Christmas- no new complaints or concerns...  saw DrKatz 9/10- known nonobstructive CAD, mild AS & min MS, RBBB, hx AFlutter ablation> no change & f/u 61yr planned...    ~  January 30, 2010:  he notes some dizziness but on careful questioning he is describing dizzinees after bending/ straining/ w/ valsalva maneuver> we discussed this physiology & he understands...  breathing has been stable;  no CP/ palpit/ ch in dyspnea/ etc;  GI remians stable on meds;  GU followed by DrPeterson on 3 meds- stable;  no new complaints or concerns...    Current Problem List:  ALLERGIC RHINITIS (ICD-477.9) - uses OTC antihist Prn & Flonase spray...  COPD (ICD-496) & DISORDERS OF DIAPHRAGM (ICD-519.4) - he is an ex-smoker- having smoked <27yrs, and quit >43yrs ago... stable on ADVAIR 500 Prn, PROAIR Prn, MUCINEX 2Bid Prn... otherwise doing well, mows yard etc... still says he needs the TUSSIONEX for cough...   ~  SCANS:  9/03= elev L hemidaiph w/ scarring at base, & RLL infiltrate resolved...  ~  baseline CXR 2/08 - s/p median sternotomy (mediastinal melanoma surg 1980's), elev L hemidiaph & chr changes, NAD.Marland Kitchen.  ~  f/u CXR 03/13/08 = similar chr changes on the left...  ~  PFT 1/06 - FVC 2.46 (54%), FEV1= 1.64 (46%), %1sec=67, mid-flows=20%. FEV1 in 1998 was 1.89.  ~  PFT 8/09 showed FVC= 2.61 (58%), FEV1= 1.73 (49%), %1sec=66, mid-flows= 29%...  ~  CXR  1/11 showed stable COPD, NAD...  ESSENTIAL HYPERTENSION (ICD-401.9) - he's been on ATENOLOL 50mg /d, & BP noted to be mildly elevated 4/11 and started on DIOVAN/ HCT 160-12.5 w/ improvement & he was able to decr to 1/2 tab daily...  ~  6/11:  BP= 110/60 & similar at home... offered to ch Diovan but he prefers to continue 1/2 tab daily.  CORONARY ARTERY DISEASE (ICD-414.00) - takes ATENOLOL 50mg /d, and ASA 81mg /d...   ~   min non-obstructive CAD on cath 7/03 w/ luminal irregs to 30% in Circ...  ~  cath 6/09 showed 30-40% LAD, 40-50% RCA, & 20% CIRC = non-obstructive CAD  ~  last OV DrKatz yearly-  seen 9/10 & stable.  RIGHT BUNDLE BRANCH BLOCK (ICD-426.4) - baseline EKG is NSR, RBBB...  VALVULAR HEART DISEASE (ICD-424.90) & Hx of FLUTTER, ATRIAL (ICD-427.32) - s/p cath ablation in 2003 by DrTaylor...  ~  2DEcho 2/06 showed conc LVH, sclerotic AoV w/o AS, low-norm LVF...  ~  2DEcho 6/09 showed mod calcif AoV w/ reduced leaflet excursion c/w mild AS, mod mitral annular calcif w/ mild MS, dil LA & RV, mild incr PA sys ~ 40, norm LVF w/ EF=55-60%  VENOUS INSUFFICIENCY (ICD-459.81) w/ EDEMA (ICD-782.3) - treats w/ low sodium diet... BNP= 114 in Jun08... exam shows superfic VV, no signif edema, etc...  HYPERCHOLESTEROLEMIA (ICD-272.0) - on diet + FISH OIL...  ~  FLP 6/08 showed TChol  180, TG 37, HDL 52, LDL 121... he was not interested in Statin therapy.  ~  FLP 4/10 showed TChol 189, TG 34, HDL 53, LDL 130... not at goal- needs meds/ he prefers diet.  ~  FLP 1/11 showed TChol 195, TG 38, HDL 54, LDL 133... needs better diet.  GERD (ICD-530.81) - on OMEPRAZOLE 20mg /d, and ZANTAC 150mg Qhs...  DIVERTICULOSIS OF COLON (ICD-562.10) - notes some constipation Rx w/ MIRALAX...  ~  last colonoscopy 2/05 by DrPatterson showed divertics only...   HYPERTROPHY PROSTATE W/UR OBST & OTH LUTS (ICD-600.01) - treated by DrPeterson w/ AVODART 0.5mg /d, FLOMAX 0.4mg Bid, & SANCTURA 20mg Bid...  PSA, INCREASED  (ICD-790.93) - pt states that PSA was as high as 9 and improved to 5.6.Marland KitchenMarland Kitchen pt reports recent eval for hematuria- note pending...   ~  10/10: he reports his PSA 10/10 DrPeterson was 5.3  ~  6/11: he reports that his PSA recently = 5.1  DEGENERATIVE JOINT DISEASE (ICD-715.90) - on MOBIC 7.5mg /d...   INSOMNIA, CHRONIC (ICD-307.42) - uses AMBIEN 10mg  Qhs...  MALIGNANT MELANOMA, HX OF (ICD-V10.82) - s/p surgery of mediastinal melanoma 1980's... no known recurrence...   Allergies: 1)  ! * Ivp Dye  Comments:  Nurse/Medical Assistant: The patient's medications and allergies were reviewed with the patient and were updated in the Medication and Allergy Lists.  Past History:  Past Medical History: ALLERGIC RHINITIS (ICD-477.9) COPD (ICD-496) DISORDERS OF DIAPHRAGM (ICD-519.4) Hx of BRONCHITIS, ACUTE (ICD-466.0) ESSENTIAL HYPERTENSION (ICD-401.9) CORONARY ARTERY DISEASE (ICD-414.00).... mild.. by catheterization in June, 2009. .. this catheterization followed a nuclear scan with question of mild lateral ischemia and a hypotensive response to exercise. Ejection fraction 55-60%... echo. june, 2009 Aortic stenosis.. mild.Marland Kitchen echo and catheterization June, 2009 Mitral stenosis...?????very mild by echo related to mitral annular calcification RIGHT BUNDLE BRANCH BLOCK (ICD-426.4) VALVULAR HEART DISEASE (ICD-424.90) Hx of FLUTTER, ATRIAL (ICD-427.32)....ablated in the past with no recurrence VENOUS INSUFFICIENCY (ICD-459.81) EDEMA (ICD-782.3) HYPERCHOLESTEROLEMIA (ICD-272.0)...patient hesitant to use statin. GERD (ICD-530.81) DIVERTICULOSIS OF COLON (ICD-562.10) HYPERTROPHY PROSTATE W/UR OBST & OTH LUTS (ICD-600.01) PSA, INCREASED (ICD-790.93) DEGENERATIVE JOINT DISEASE (ICD-715.90) INSOMNIA, CHRONIC (ICD-307.42) MALIGNANT MELANOMA, HX OF (ICD-V10.82)..mediastinal removed via thoracotomy td booster January 31, 2009   Past Surgical History: S/P bialt inguinal hernia repairs S/P umbilical hernia  repair  Family History: Reviewed history from 03/07/2009 and no changes required. Mother - arthritis Sister - lung cancer Brother - dec'd at age 103 - firefighter  Social History: Reviewed history from 03/07/2009 and no changes required. Married, wife= Evelyn 6 children- 2 biological, 4 adopted ex-smoker, quit 40 yrs social alcohol retired [employer], [employer]...  Review of Systems      See HPI       The patient complains of dyspnea on exertion.  The patient denies anorexia, fever, weight loss, weight gain, vision loss, decreased hearing, hoarseness, chest pain, syncope, peripheral edema, prolonged cough, headaches, hemoptysis, abdominal pain, melena, hematochezia, severe indigestion/heartburn, hematuria, incontinence, muscle weakness, suspicious skin lesions, transient blindness, difficulty walking, depression, unusual weight change, abnormal bleeding, enlarged lymph nodes, and angioedema.    Vital Signs:  Patient profile:   77 year old male Height:      72 inches Weight:      189 pounds BMI:     25.73 O2 Sat:      95 % on Room air Temp:     99.1 degrees F oral Pulse rate:   93 / minute BP sitting:   110 / 60  (right arm) Cuff size:   regular  Vitals Entered By: Randell Loop CMA (January 30, 2010 2:09 PM)  O2 Sat at Rest %:  95 O2 Flow:  Room air CC: 6 month ROV & review... Is Patient Diabetic? No Pain Assessment Patient in pain? no      Comments meds updated today with pt   Physical Exam  Additional Exam:  WD, WN, 77 y/o WM in NAD... GENERAL:  Alert & oriented; pleasant & cooperative... HEENT:  [place]/AT, EOM-wnl, PERRLA, EACs-clear, TMs-wnl, NOSE-clear, THROAT-clear & wnl. NECK:  Supple w/ fairROM; no JVD; normal carotid impulses w/o bruits; no thyromegaly or nodules palpated; no lymphadenopathy. CHEST:  Decr BS in bases , clear without wheezes/ rales/ or rhonchi, s/p median sternotomy. HEART:  regular rhythm; gr 2/6 sys murmur, without rubs of gallops  appreciated... ABDOMEN:  Soft & nontender; normal bowel sounds; no organomegaly or masses detected. EXT: without deformities, mild arthritic changes, +ven insuffic w/ superfic VV- no c/c/edema... right hand wound healing nicely... NEURO:  CN's intact; motor testing normal; sensory testing normal; gait normal & balance OK. DERM:  no rash, no skin lesions noted...    Impression & Recommendations:  Problem # 1:  COPD (ICD-496) Stable>  continue Rx. His updated medication list for this problem includes:    Advair Diskus 500-50 Mcg/dose Misc (Fluticasone-salmeterol) .Marland Kitchen... Take one puff twice daily    Proair Hfa 108 (90 Base) Mcg/act Aers (Albuterol sulfate) ..... Inhale 2 puffs every four hours as needed  Problem # 2:  ESSENTIAL HYPERTENSION (ICD-401.9) BP controlled>  continue present meds... His updated medication list for this problem includes:    Atenolol 25 Mg Tabs (Atenolol) .Marland Kitchen... Take 1 tablet by mouth two times a day    Diovan Hct 160-12.5 Mg Tabs (Valsartan-hydrochlorothiazide) .Marland Kitchen... 1/2 by  mouth once daily  Problem # 3:  CORONARY ARTERY DISEASE (ICD-414.00) Hx CAD, and mild AS> Followed by Delton See & stable... His updated medication list for this problem includes:    Bayer Low Strength 81 Mg Tbec (Aspirin) .Marland Kitchen... Take one pill by mouth once daily    Atenolol 25 Mg Tabs (Atenolol) .Marland Kitchen... Take 1 tablet by mouth two times a day    Diovan Hct 160-12.5 Mg Tabs (Valsartan-hydrochlorothiazide) .Marland Kitchen... 1/2 by  mouth once daily  Problem # 4:  VENOUS INSUFFICIENCY (ICD-459.81) Stable w/ low sodium + sm dose of HCT in the Diovan/ HCT...  Problem # 5:  HYPERCHOLESTEROLEMIA (ICD-272.0) We discussed diet + exercise...  Problem # 6:  GERD (ICD-530.81) GI is stable>  same meds. His updated medication list for this problem includes:    Omeprazole 20 Mg Tbec (Omeprazole) .Marland Kitchen... Take one pill by mouth once daily    Ranitidine Hcl 150 Mg Caps (Ranitidine hcl) .Marland Kitchen... Take 1 tablet by mouth once a  day  Problem # 7:  HYPERTROPHY PROSTATE W/UR OBST & OTH LUTS (ICD-600.01) GU is stable & followed by 'DrPeterson... His updated medication list for this problem includes:    Avodart 0.5 Mg Caps (Dutasteride) .Marland Kitchen... Take one pill by mouth once daily    Flomax 0.4 Mg Cp24 (Tamsulosin hcl) .Marland Kitchen... Take two pills by mouth once daily  Problem # 8:  DEGENERATIVE JOINT DISEASE (ICD-715.90) Stable... The following medications were removed from the medication list:    Meloxicam 15 Mg Tabs (Meloxicam) .Marland Kitchen... Take 1 tab by mouth at bedtime His updated medication list for this problem includes:    Bayer Low Strength 81 Mg Tbec (Aspirin) .Marland Kitchen... Take one pill by mouth once daily    Meloxicam  7.5 Mg Tabs (Meloxicam) .Marland Kitchen... Take 1 tablet by mouth every morning  Complete Medication List: 1)  Fluticasone Propionate 50 Mcg/act Susp (Fluticasone propionate) .Marland Kitchen.. 1-2 sp in each nostril at bedtime.Marland KitchenMarland Kitchen 2)  Magic Mouthwash  .... 1 tsp gargle and swallow as needed... 3)  Tussionex Pennkinetic Er 8-10 Mg/67ml Lqcr (Chlorpheniramine-hydrocodone) .Marland Kitchen.. 1 tsp two times a day as needed cough 4)  Advair Diskus 500-50 Mcg/dose Misc (Fluticasone-salmeterol) .... Take one puff twice daily 5)  Proair Hfa 108 (90 Base) Mcg/act Aers (Albuterol sulfate) .... Inhale 2 puffs every four hours as needed 6)  Mucinex Dm 30-600 Mg Xr12h-tab (Dextromethorphan-guaifenesin) .... Take 1-2 tablets every 12 hours as needed 7)  Bayer Low Strength 81 Mg Tbec (Aspirin) .... Take one pill by mouth once daily 8)  Atenolol 25 Mg Tabs (Atenolol) .... Take 1 tablet by mouth two times a day 9)  Diovan Hct 160-12.5 Mg Tabs (Valsartan-hydrochlorothiazide) .... 1/2 by  mouth once daily 10)  Fish Oil 1000 Mg Caps (Omega-3 fatty acids) .... Take 1 capsule by mouth once a day 11)  Omeprazole 20 Mg Tbec (Omeprazole) .... Take one pill by mouth once daily 12)  Ranitidine Hcl 150 Mg Caps (Ranitidine hcl) .... Take 1 tablet by mouth once a day 13)  Miralax Powd  (Polyethylene glycol 3350) .... Once daily 14)  Avodart 0.5 Mg Caps (Dutasteride) .... Take one pill by mouth once daily 15)  Flomax 0.4 Mg Cp24 (Tamsulosin hcl) .... Take two pills by mouth once daily 16)  Sanctura 20 Mg Tabs (Trospium chloride) .... Take one pill by mouth twice daily 17)  Meloxicam 7.5 Mg Tabs (Meloxicam) .... Take 1 tablet by mouth every morning 18)  Ambien 10 Mg Tabs (Zolpidem tartrate) .... Take 1 tab by mouth at bedtime.... 19)  Cranberry Concentrate 500 Mg Caps (Cranberry) .... Take 1 capsule by mouth once a day  Patient Instructions: 1)  Today we updated your med list- see below.... 2)  Continue your current meds the same... 3)  Call for any problems.Marland KitchenMarland Kitchen 4)  Please schedule a follow-up appointment in 6 months, with FASTING blood work at that time.Marland KitchenMarland Kitchen

## 2010-09-02 NOTE — Assessment & Plan Note (Signed)
Summary: Acute NP office visit - stomach pain   Primary Provider/Referring Provider:  Kriste Basque, MD  CC:  small, hard BMs x2 with straining x1week, and states took 2 laxatives last night which reuslted in stomach cramping but still had a hard BM.  History of Present Illness: 77  y/o Christian [name] with known hx of COPD,    January 31, 2009 --Presents for an acute office visit. bitten by cat last PM about 9 on right, wrist, now red, swollen, hot to touch, Fever today. Swelling overnight. Last Td booster >5 yrs. Cat got mad at him and bit him when he picked it up. Personal pet, shots up to date.      ~  March 07, 2009:  routine f/u visit doing reasonably well without new complaints or concerns, but he describes an episode of choking several weeks ago while eating fruit salad at [employer]- "coughed up 3 wads of thick phlegm & a tiny piece of appleskin"...  he also had a cat bite w/ cellulitis right hand req 3d in hosp for IV antibiotics by DrSypher...   ~  August 19, 2009:  generally stable- he had a good Christmas- no new complaints or concerns...  saw DrKatz 9/10- known nonobstructive CAD  October 09, 2009--Presents for work in visit. elevated BP x3 days w/ Headache.  pt has brought BP log with him today. b/p running 150-180/90. Feels okay, but had intermittent frontal headache. Denies visual /speech changes, ext. weakness, chest pain, dyspnea. No OTC or new meds. Review shows b/p trending up at last visit 2 months ago.    November 12, 2009 --Presents for follow up of b/p. Last visit, b/p trending up , Diovan 160/12.5mg  added. He has tolerated except b/p has  been on lower end of nml, does feel mild lightheadness when he stands. b/p avg  ~90-100.   Dec 02, 2009--Presents for work in visit. Complains of small, hard BMs x2 with straining x1week, states took 2 laxatives last night which reuslted in stomach cramping but still had a hard BM. Last good BM was 1 week ago, then small hard stools every other day. Over last month,  mild constipation getting worse.  Denies chest pain, dyspnea, orthopnea, hemoptysis, fever, n/v/d, edema, headache, bloody stools, urinary symtpoms.   Medications Prior to Update: 1)  Fluticasone Propionate 50 Mcg/act Susp (Fluticasone Propionate) .Marland Kitchen.. 1-2 Sp in Each Nostril At Bedtime.Marland KitchenMarland Kitchen 2)  Magic Mouthwash .... 1 Tsp Gargle and Swallow As Needed... 3)  Tussionex Pennkinetic Er 8-10 Mg/36ml Lqcr (Chlorpheniramine-Hydrocodone) .Marland Kitchen.. 1 Tsp Two Times A Day As Needed Cough 4)  Advair Diskus 500-50 Mcg/dose  Misc (Fluticasone-Salmeterol) .... Take One Puff Twice Daily 5)  Proair Hfa 108 (90 Base) Mcg/act Aers (Albuterol Sulfate) .... Inhale 2 Puffs Every Four Hours As Needed 6)  Mucinex Dm 30-600 Mg Xr12h-Tab (Dextromethorphan-Guaifenesin) .... Take 1-2 Tablets Every 12 Hours As Needed 7)  Bayer Low Strength 81 Mg  Tbec (Aspirin) .... Take One Pill By Mouth Once Daily 8)  Atenolol 25 Mg Tabs (Atenolol) .... Take 1 Tablet By Mouth Two Times A Day 9)  Fish Oil 1000 Mg Caps (Omega-3 Fatty Acids) .... Take 1 Capsule By Mouth Once A Day 10)  Omeprazole 20 Mg  Tbec (Omeprazole) .... Take One Pill By Mouth Once Daily 11)  Ranitidine Hcl 150 Mg Caps (Ranitidine Hcl) .... Take 1 Tablet By Mouth Once A Day 12)  Avodart 0.5 Mg  Caps (Dutasteride) .... Take One Pill By Mouth Once Daily  13)  Flomax 0.4 Mg  Cp24 (Tamsulosin Hcl) .... Take Two Pills By Mouth Once Daily 14)  Sanctura 20 Mg  Tabs (Trospium Chloride) .... Take One Pill By Mouth Twice Daily 15)  Meloxicam 7.5 Mg  Tabs (Meloxicam) .... Take 1 Tablet By Mouth Every Morning 16)  Ambien 10 Mg  Tabs (Zolpidem Tartrate) .... Take 1 Tab By Mouth At Bedtime.... 17)  Meloxicam 15 Mg Tabs (Meloxicam) .... Take 1 Tab By Mouth At Bedtime 18)  Cranberry Concentrate 500 Mg Caps (Cranberry) .... Take 1 Capsule By Mouth Once A Day 19)  Urea 40 % Gel (Urea) .... Apply To Toenail Two Times A Day 20)  Diovan Hct 160-12.5 Mg Tabs (Valsartan-Hydrochlorothiazide) .... 1/2  By  Mouth Once Daily  Current Medications (verified): 1)  Fluticasone Propionate 50 Mcg/act Susp (Fluticasone Propionate) .Marland Kitchen.. 1-2 Sp in Each Nostril At Bedtime.Marland KitchenMarland Kitchen 2)  Magic Mouthwash .... 1 Tsp Gargle and Swallow As Needed... 3)  Tussionex Pennkinetic Er 8-10 Mg/23ml Lqcr (Chlorpheniramine-Hydrocodone) .Marland Kitchen.. 1 Tsp Two Times A Day As Needed Cough 4)  Advair Diskus 500-50 Mcg/dose  Misc (Fluticasone-Salmeterol) .... Take One Puff Twice Daily 5)  Proair Hfa 108 (90 Base) Mcg/act Aers (Albuterol Sulfate) .... Inhale 2 Puffs Every Four Hours As Needed 6)  Mucinex Dm 30-600 Mg Xr12h-Tab (Dextromethorphan-Guaifenesin) .... Take 1-2 Tablets Every 12 Hours As Needed 7)  Bayer Low Strength 81 Mg  Tbec (Aspirin) .... Take One Pill By Mouth Once Daily 8)  Atenolol 25 Mg Tabs (Atenolol) .... Take 1 Tablet By Mouth Two Times A Day 9)  Fish Oil 1000 Mg Caps (Omega-3 Fatty Acids) .... Take 1 Capsule By Mouth Once A Day 10)  Omeprazole 20 Mg  Tbec (Omeprazole) .... Take One Pill By Mouth Once Daily 11)  Ranitidine Hcl 150 Mg Caps (Ranitidine Hcl) .... Take 1 Tablet By Mouth Once A Day 12)  Avodart 0.5 Mg  Caps (Dutasteride) .... Take One Pill By Mouth Once Daily 13)  Flomax 0.4 Mg  Cp24 (Tamsulosin Hcl) .... Take Two Pills By Mouth Once Daily 14)  Sanctura 20 Mg  Tabs (Trospium Chloride) .... Take One Pill By Mouth Twice Daily 15)  Meloxicam 7.5 Mg  Tabs (Meloxicam) .... Take 1 Tablet By Mouth Every Morning 16)  Ambien 10 Mg  Tabs (Zolpidem Tartrate) .... Take 1 Tab By Mouth At Bedtime.... 17)  Meloxicam 15 Mg Tabs (Meloxicam) .... Take 1 Tab By Mouth At Bedtime 18)  Cranberry Concentrate 500 Mg Caps (Cranberry) .... Take 1 Capsule By Mouth Once A Day 19)  Diovan Hct 160-12.5 Mg Tabs (Valsartan-Hydrochlorothiazide) .... 1/2 By  Mouth Once Daily  Allergies (verified): 1)  ! * Ivp Dye  Past History:  Past Medical History: Last updated: 08/19/2009 ALLERGIC RHINITIS (ICD-477.9) COPD (ICD-496) DISORDERS OF  DIAPHRAGM (ICD-519.4) Hx of BRONCHITIS, ACUTE (ICD-466.0) CORONARY ARTERY DISEASE (ICD-414.00).... mild.. by catheterization in June, 2009. .. this catheterization followed a nuclear scan with question of mild lateral ischemia and a hypotensive response to exercise. Ejection fraction 55-60%... echo. june, 2009 Aortic stenosis.. mild.Marland Kitchen echo and catheterization June, 2009 Mitral stenosis...?????very mild by echo related to mitral annular calcification RIGHT BUNDLE BRANCH BLOCK (ICD-426.4) VALVULAR HEART DISEASE (ICD-424.90) Hx of FLUTTER, ATRIAL (ICD-427.32)....ablated in the past with no recurrence VENOUS INSUFFICIENCY (ICD-459.81) EDEMA (ICD-782.3) HYPERCHOLESTEROLEMIA (ICD-272.0)...patient hesitant to use statin. GERD (ICD-530.81) DIVERTICULOSIS OF COLON (ICD-562.10) HYPERTROPHY PROSTATE W/UR OBST & OTH LUTS (ICD-600.01) PSA, INCREASED (ICD-790.93) DEGENERATIVE JOINT DISEASE (ICD-715.90) INSOMNIA, CHRONIC (ICD-307.42) MALIGNANT MELANOMA, HX OF (ICD-V10.82)..mediastinal removed  via thoracotomy td booster January 31, 2009   Past Surgical History: Last updated: 08/19/2009 S/P bialt inguinal hernia repairs S/P umbilical hernia repair  Family History: Last updated: 03/07/2009 Mother - arthritis Sister - lung cancer Brother - dec'd at age 104 - firefighter  Social History: Last updated: 03/07/2009 Married, wife= Evelyn 6 children- 2 biological, 4 adopted ex-smoker, quit 40 yrs social alcohol retired [employer], [employer]...  Risk Factors: Smoking Status: quit (05/14/2007)  Past Pulmonary History:  Pulmonary History: PULMONARY HX  ALLERGIC RHINITIS (ICD-477.9) - uses OTC antihist Prn & Flonase spray...  COPD (ICD-496) & DISORDERS OF DIAPHRAGM (ICD-519.4) - he is an ex-smoker- having smoked <65yrs, and quit >79yrs ago.Marland KitchenMarland KitchenADVAIR 500 Prn, ALBUTEROL Prn, MUCINEX 2Bid Prn... otherwise doing well, mows yard etc... still says he needs the TUSSIONEX for cough...   ~  SCANS:  9/03= elev  L hemidaiph w/ scarring at base, & RLL infiltrate resolved...  ~  baseline CXR 2/08 - s/p median sternotomy (mediastinal melanoma surg 1980's), elev L hemidiaph & chr changes, NAD.Marland Kitchen.  ~  f/u CXR 03/13/08 = similar chr changes on the left...  ~  PFT 1/06 - FVC 2.46 (54%), FEV1= 1.64 (46%), %1sec=67, mid-flows=20%. FEV1 in 1998 was 1.89.  ~  PFT 8/09 showed FVC= 2.61 (58%), FEV1= 1.73 (49%), %1sec=66, mid-flows= 29%...  ~  CXR 1/11 showed stable COPD, NAD...  CORONARY ARTERY DISEASE (ICD-414.00) - takes ATENOLOL 50mg /d, and ASA 81mg /d...   ~   min non-obstructive CAD on cath 7/03 w/ luminal irregs to 30% in Circ...  ~  cath 6/09 showed 30-40% LAD, 40-50% RCA, & 20% CIRC = non-obstructive CAD  ~  last OV DrKatz yearly-  seen 9/10 & stable.  RIGHT BUNDLE BRANCH BLOCK (ICD-426.4) - baseline EKG is NSR, RBBB...  VALVULAR HEART DISEASE (ICD-424.90) & Hx of FLUTTER, ATRIAL (ICD-427.32) - s/p cath ablation in 2003 by DrTaylor...  ~  2DEcho 2/06 showed conc LVH, sclerotic AoV w/o AS, low-norm LVF...  ~  2DEcho 6/09 showed mod calcif AoV w/ reduced leaflet excursion c/w mild AS, mod mitral annular calcif w/ mild MS, dil LA & RV, mild incr PA sys ~ 40, norm LVF w/ EF=55-60%  VENOUS INSUFFICIENCY (ICD-459.81) w/ EDEMA (ICD-782.3) - treats w/ low sodium diet, not currently on diuretic Rx... BNP= 114 in Jun08... exam shows superfic VV, no signif edema, etc...  HYPERCHOLESTEROLEMIA (ICD-272.0) - on diet + FISH OIL...  ~  FLP 6/08 showed TChol 180, TG 37, HDL 52, LDL 121... he was not interested in Statin therapy.  ~  FLP 4/10 showed TChol 189, TG 34, HDL 53, LDL 130... not at goal- needs meds/ he pre  Review of Systems      See HPI  Vital Signs:  Patient profile:   77 year old male Height:      72 inches Weight:      190.19 pounds BMI:     25.89 O2 Sat:      99 % on Room air Temp:     97.2 degrees F oral Pulse rate:   86 / minute BP sitting:   122 / 84  (right arm) Cuff size:   regular  Vitals  Entered By: Boone Master CNA (Dec 02, 2009 10:36 AM)  O2 Flow:  Room air CC: small, hard BMs x2 with straining x1week, states took 2 laxatives last night which reuslted in stomach cramping but still had a hard BM Is Patient Diabetic? No Comments Medications reviewed with patient Daytime  contact number verified with patient. Boone Master CNA  Dec 02, 2009 10:30 AM    Physical Exam  Additional Exam:  WD, WN, 77y/o Christian [name] in NAD... GENERAL:  Alert & oriented; pleasant & cooperative... HEENT:  [place]/AT, EACs-clear, TMs-wnl, NOSE-clear, THROAT-clear & wnl. NECK:  Supple w/ full ROM; no JVD; normal carotid impulses w/o bruits; no thyromegaly or nodules palpated; no lymphadenopathy. CHEST:  Decr BS in bases , clear without wheezes/ rales/ or rhonchi, s/p median sternotomy. HEART:  regular rhythm; gr 2/6 sys murmur, without rubs of gallops appreciated... ABDOMEN:  Soft & nontender; normal bowel sounds; no organomegaly or masses detected, no guarding or rebound.  EXT: without deformities, mild arthritic changes, +ven insuffic w/ superfic VV- NEURO:  CN's intact; motor testing normal; sensory testing normal; gait normal & balance OK.     Impression & Recommendations:  Problem # 1:  CONSTIPATION (ICD-564.00) Persistent constipation despite OTC meds.  REC:   Begin Stool softner 2 by mouth at bedtime for 1 week then 1 by mouth at bedtime  Begin Miralax 1 capful once daily  Increase fiber in diet, fruits, cereal, metamucil/fiber cond daily, increase water and juice.  Begin Activa yogurt daily  If still no results can use bottle of Mag Citrate for severe constipation  Please contact office for sooner follow up if symptoms do not improve or worsen   Orders: Est. Patient Level III (14782)  Complete Medication List: 1)  Fluticasone Propionate 50 Mcg/act Susp (Fluticasone propionate) .Marland Kitchen.. 1-2 sp in each nostril at bedtime.Marland KitchenMarland Kitchen 2)  Magic Mouthwash  .... 1 tsp gargle and swallow as needed... 3)   Tussionex Pennkinetic Er 8-10 Mg/75ml Lqcr (Chlorpheniramine-hydrocodone) .Marland Kitchen.. 1 tsp two times a day as needed cough 4)  Advair Diskus 500-50 Mcg/dose Misc (Fluticasone-salmeterol) .... Take one puff twice daily 5)  Proair Hfa 108 (90 Base) Mcg/act Aers (Albuterol sulfate) .... Inhale 2 puffs every four hours as needed 6)  Mucinex Dm 30-600 Mg Xr12h-tab (Dextromethorphan-guaifenesin) .... Take 1-2 tablets every 12 hours as needed 7)  Bayer Low Strength 81 Mg Tbec (Aspirin) .... Take one pill by mouth once daily 8)  Atenolol 25 Mg Tabs (Atenolol) .... Take 1 tablet by mouth two times a day 9)  Fish Oil 1000 Mg Caps (Omega-3 fatty acids) .... Take 1 capsule by mouth once a day 10)  Omeprazole 20 Mg Tbec (Omeprazole) .... Take one pill by mouth once daily 11)  Ranitidine Hcl 150 Mg Caps (Ranitidine hcl) .... Take 1 tablet by mouth once a day 12)  Avodart 0.5 Mg Caps (Dutasteride) .... Take one pill by mouth once daily 13)  Flomax 0.4 Mg Cp24 (Tamsulosin hcl) .... Take two pills by mouth once daily 14)  Sanctura 20 Mg Tabs (Trospium chloride) .... Take one pill by mouth twice daily 15)  Meloxicam 7.5 Mg Tabs (Meloxicam) .... Take 1 tablet by mouth every morning 16)  Ambien 10 Mg Tabs (Zolpidem tartrate) .... Take 1 tab by mouth at bedtime.... 17)  Meloxicam 15 Mg Tabs (Meloxicam) .... Take 1 tab by mouth at bedtime 18)  Cranberry Concentrate 500 Mg Caps (Cranberry) .... Take 1 capsule by mouth once a day 19)  Diovan Hct 160-12.5 Mg Tabs (Valsartan-hydrochlorothiazide) .... 1/2 by  mouth once daily  Patient Instructions: 1)  Begin Stool softner 2 by mouth at bedtime for 1 week then 1 by mouth at bedtime  2)  Begin Miralax 1 capful once daily  3)  Increase fiber in diet, fruits, cereal, metamucil/fiber  cond daily, increase water and juice.  4)  Begin Activa yogurt daily  5)  If still no results can use bottle of Mag Citrate for severe constipation  6)  Please contact office for sooner follow up if  symptoms do not improve or worsen

## 2010-09-02 NOTE — Assessment & Plan Note (Signed)
Summary: Acute NP office visit - HTN   Primary Provider/Referring Provider:  Kriste Basque, MD  CC:  elevated BP x3 days w/ HA.  History of Present Illness: 77  y/o WM with known hx of COPD,   ~  today he walked into the office w/ concern for a blood clot in his left leg... he noticed a sm knot on the medial aspect of his lower left leg this AM w/ assoc bruising below & is worried that he has a blood clot... he denies any known trauma to the leg but he mows several yards weekly and may have hit it... he has assoc ven insuffic/ VV, but no signif edema, swelling, pain, erythema, tenderness, etc... exam shows a sm knot c/w a small hematoma w/ bruising under the skin below it...   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 he has been eval by [facility] & Myoview showed decreased BP w/ exercise, ? of slight ischemia, EF= 64%... 2DEcho 6/16 showed mod calcif AoV w/ reduced leaflet excursion c/w mild AS, mod mitral annular calcif w/ mild MS, dil LA & RV, mild incr PA sys ~ 40, norm LVF w/ EF=55-60%... cath 6/17 showed 30-40% LAD, 40-50% RCA, & 20% CIRC = non-obstructive CAD... ATENOLOL increased to 50mg /d...   ~  he completed his f/u pulm eval this summer (see below)... 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 he has a known elevated left hemidiaphragm  and scar tissue left base w/ PFT's showing combined restrictive & obstructive defects... old CT Chest in 1997 reports "scarring in the left hemithorax and elevated left hemidiaphragm without change from old films back to 1983"...  July 17, 2008 -- follow up. Doing well, dyspnea at baseline. Got flu and H1N1 last month.   Active with lawn care work and light exercise.   September 04, 2008--Complains of 2 weeks of cough, congestion, stuffy nose, brown green mucous, drainage. cough keeping up at night.    Wife has similar symptoms, seen in office today. Keeping grandkids.    January 31, 2009 --Presents for an acute office visit. bitten by cat last PM about 9 on right, wrist, now red, swollen, hot to touch, Fever today. Swelling overnight. Last Td booster >5 yrs. Cat got mad at him and bit him when he picked it up. Personal pet, shots up to date. Denies chest pain, dyspnea, orthopnea, hemoptysis, fever, n/v/d, edema, headache,drainage, bleeding. Cleaned w/ soap/water and applied neosporin.     ~  March 07, 2009:  routine f/u visit doing reasonably well without new complaints or concerns, but he describes an episode of choking several weeks ago while eating fruit salad at [employer]- "coughed up 3 wads of thick phlegm & a tiny piece of appleskin"...  he also had a cat bite w/ cellulitis right hand req 3d in hosp for IV antibiotics by DrSypher...   ~  August 19, 2009:  generally stable- he had a good Christmas- no new complaints or concerns...  saw DrKatz 9/10- known nonobstructive CAD  October 09, 2009--Presents for work in visit. elevated BP x3 days w/ Headache.  pt has brought BP log with him today. b/p running 150-180/90. Feels okay, but had intermittent frontal headache. Denies visual /speech changes, ext. weakness, chest pain, dyspnea. No OTC or new meds. Review shows b/p trending up  at last visit 2 months ago. Denies chest pain, dyspnea, orthopnea, hemoptysis, fever, n/v/d, edema, headache,recent travel.    Medications Prior to Update: 1)  Fluticasone Propionate 50 Mcg/act Susp (Fluticasone Propionate) .Marland Kitchen.. 1-2 Sp in Each Nostril At Bedtime.Marland KitchenMarland Kitchen 2)  Magic Mouthwash .... 1 Tsp Gargle and Swallow As Needed... 3)  Tussionex Pennkinetic Er 8-10 Mg/73ml Lqcr (Chlorpheniramine-Hydrocodone) .Marland Kitchen.. 1 Tsp Two Times A Day As Needed Cough 4)  Advair Diskus 500-50 Mcg/dose  Misc (Fluticasone-Salmeterol) .... Take One Puff Twice Daily 5)  Albuterol 90 Mcg/act  Aers (Albuterol) .... Take Two Puffs Every Four To Six Hours As  Needed 6)  Mucinex 600 Mg Xr12h-Tab (Guaifenesin) .... Take 1 Tablet By Mouth Two Times A Day 7)  Bayer Low Strength 81 Mg  Tbec (Aspirin) .... Take One Pill By Mouth Once Daily 8)  Atenolol 25 Mg Tabs (Atenolol) .... Take 1 Tablet By Mouth Two Times A Day 9)  Fish Oil 500 Mg Caps (Omega-3 Fatty Acids) .... Take 1 Capsule By Mouth Once A Day 10)  Omeprazole 20 Mg  Tbec (Omeprazole) .... Take One Pill By Mouth Once Daily 11)  Ranitidine Hcl 150 Mg Caps (Ranitidine Hcl) .... Take 1 Tablet By Mouth Once A Day 12)  Avodart 0.5 Mg  Caps (Dutasteride) .... Take One Pill By Mouth Once Daily 13)  Flomax 0.4 Mg  Cp24 (Tamsulosin Hcl) .... Take Two Pills By Mouth Once Daily 14)  Sanctura 20 Mg  Tabs (Trospium Chloride) .... Take One Pill By Mouth Twice Daily 15)  Meloxicam 7.5 Mg  Tabs (Meloxicam) .... Take 1 Tablet By Mouth Every Morning 16)  Ambien 10 Mg  Tabs (Zolpidem Tartrate) .... Take 1 Tab By Mouth At Bedtime....  Current Medications (verified): 1)  Fluticasone Propionate 50 Mcg/act Susp (Fluticasone Propionate) .Marland Kitchen.. 1-2 Sp in Each Nostril At Bedtime.Marland KitchenMarland Kitchen 2)  Magic Mouthwash .... 1 Tsp Gargle and Swallow As Needed... 3)  Tussionex Pennkinetic Er 8-10 Mg/9ml Lqcr (Chlorpheniramine-Hydrocodone) .Marland Kitchen.. 1 Tsp Two Times A Day As Needed Cough 4)  Advair Diskus 500-50 Mcg/dose  Misc (Fluticasone-Salmeterol) .... Take One Puff Twice Daily 5)  Proair Hfa 108 (90 Base) Mcg/act Aers (Albuterol Sulfate) .... Inhale 2 Puffs Every Four Hours As Needed 6)  Mucinex Dm 30-600 Mg Xr12h-Tab (Dextromethorphan-Guaifenesin) .... Take 1-2 Tablets Every 12 Hours As Needed 7)  Bayer Low Strength 81 Mg  Tbec (Aspirin) .... Take One Pill By Mouth Once Daily 8)  Atenolol 25 Mg Tabs (Atenolol) .... Take 1 Tablet By Mouth Two Times A Day 9)  Fish Oil 1000 Mg Caps (Omega-3 Fatty Acids) .... Take 1 Capsule By Mouth Once A Day 10)  Omeprazole 20 Mg  Tbec (Omeprazole) .... Take One Pill By Mouth Once Daily 11)  Ranitidine Hcl 150  Mg Caps (Ranitidine Hcl) .... Take 1 Tablet By Mouth Once A Day 12)  Avodart 0.5 Mg  Caps (Dutasteride) .... Take One Pill By Mouth Once Daily 13)  Flomax 0.4 Mg  Cp24 (Tamsulosin Hcl) .... Take Two Pills By Mouth Once Daily 14)  Sanctura 20 Mg  Tabs (Trospium Chloride) .... Take One Pill By Mouth Twice Daily 15)  Meloxicam 7.5 Mg  Tabs (Meloxicam) .... Take 1 Tablet By Mouth Every Morning 16)  Ambien 10 Mg  Tabs (Zolpidem Tartrate) .... Take 1 Tab By Mouth At Bedtime.... 17)  Meloxicam 15 Mg Tabs (Meloxicam) .... Take 1 Tab By Mouth At Bedtime 18)  Cranberry Concentrate 500 Mg Caps (Cranberry) .... Take 1 Capsule By Mouth  Once A Day 19)  Urea 40 % Gel (Urea) .... Apply To Toenail Two Times A Day  Allergies (verified): 1)  ! * Ivp Dye  Past History:  Past Medical History: Last updated: 08/19/2009 ALLERGIC RHINITIS (ICD-477.9) COPD (ICD-496) DISORDERS OF DIAPHRAGM (ICD-519.4) Hx of BRONCHITIS, ACUTE (ICD-466.0) CORONARY ARTERY DISEASE (ICD-414.00).... mild.. by catheterization in June, 2009. .. this catheterization followed a nuclear scan with question of mild lateral ischemia and a hypotensive response to exercise. Ejection fraction 55-60%... echo. june, 2009 Aortic stenosis.. mild.Marland Kitchen echo and catheterization June, 2009 Mitral stenosis...?????very mild by echo related to mitral annular calcification RIGHT BUNDLE BRANCH BLOCK (ICD-426.4) VALVULAR HEART DISEASE (ICD-424.90) Hx of FLUTTER, ATRIAL (ICD-427.32)....ablated in the past with no recurrence VENOUS INSUFFICIENCY (ICD-459.81) EDEMA (ICD-782.3) HYPERCHOLESTEROLEMIA (ICD-272.0)...patient hesitant to use statin. GERD (ICD-530.81) DIVERTICULOSIS OF COLON (ICD-562.10) HYPERTROPHY PROSTATE W/UR OBST & OTH LUTS (ICD-600.01) PSA, INCREASED (ICD-790.93) DEGENERATIVE JOINT DISEASE (ICD-715.90) INSOMNIA, CHRONIC (ICD-307.42) MALIGNANT MELANOMA, HX OF (ICD-V10.82)..mediastinal removed via thoracotomy td booster January 31, 2009   Past  Surgical History: Last updated: 08/19/2009 S/P bialt inguinal hernia repairs S/P umbilical hernia repair  Family History: Last updated: 03/07/2009 Mother - arthritis Sister - lung cancer Brother - dec'd at age 61 - firefighter  Social History: Last updated: 03/07/2009 Married, wife= Evelyn 6 children- 2 biological, 4 adopted ex-smoker, quit 40 yrs social alcohol retired [employer], [employer]...  Risk Factors: Smoking Status: quit (05/14/2007)  Past Pulmonary History:  Pulmonary History: PULMONARY HX  ALLERGIC RHINITIS (ICD-477.9) - uses OTC antihist Prn & Flonase spray...  COPD (ICD-496) & DISORDERS OF DIAPHRAGM (ICD-519.4) - he is an ex-smoker- having smoked <78yrs, and quit >10yrs ago.Marland KitchenMarland KitchenADVAIR 500 Prn, ALBUTEROL Prn, MUCINEX 2Bid Prn... otherwise doing well, mows yard etc... still says he needs the TUSSIONEX for cough...   ~  SCANS:  9/03= elev L hemidaiph w/ scarring at base, & RLL infiltrate resolved...  ~  baseline CXR 2/08 - s/p median sternotomy (mediastinal melanoma surg 1980's), elev L hemidiaph & chr changes, NAD.Marland Kitchen.  ~  f/u CXR 03/13/08 = similar chr changes on the left...  ~  PFT 1/06 - FVC 2.46 (54%), FEV1= 1.64 (46%), %1sec=67, mid-flows=20%. FEV1 in 1998 was 1.89.  ~  PFT 8/09 showed FVC= 2.61 (58%), FEV1= 1.73 (49%), %1sec=66, mid-flows= 29%...  ~  CXR 1/11 showed stable COPD, NAD...  CORONARY ARTERY DISEASE (ICD-414.00) - takes ATENOLOL 50mg /d, and ASA 81mg /d...   ~   min non-obstructive CAD on cath 7/03 w/ luminal irregs to 30% in Circ...  ~  cath 6/09 showed 30-40% LAD, 40-50% RCA, & 20% CIRC = non-obstructive CAD  ~  last OV DrKatz yearly-  seen 9/10 & stable.  RIGHT BUNDLE BRANCH BLOCK (ICD-426.4) - baseline EKG is NSR, RBBB...  VALVULAR HEART DISEASE (ICD-424.90) & Hx of FLUTTER, ATRIAL (ICD-427.32) - s/p cath ablation in 2003 by DrTaylor...  ~  2DEcho 2/06 showed conc LVH, sclerotic AoV w/o AS, low-norm LVF...  ~  2DEcho 6/09 showed mod calcif AoV w/  reduced leaflet excursion c/w mild AS, mod mitral annular calcif w/ mild MS, dil LA & RV, mild incr PA sys ~ 40, norm LVF w/ EF=55-60%  VENOUS INSUFFICIENCY (ICD-459.81) w/ EDEMA (ICD-782.3) - treats w/ low sodium diet, not currently on diuretic Rx... BNP= 114 in Jun08... exam shows superfic VV, no signif edema, etc...  HYPERCHOLESTEROLEMIA (ICD-272.0) - on diet + FISH OIL...  ~  FLP 6/08 showed TChol 180, TG 37, HDL 52, LDL 121... he was not interested in  Statin therapy.  ~  FLP 4/10 showed TChol 189, TG 34, HDL 53, LDL 130... not at goal- needs meds/ he pre  Review of Systems      See HPI  Vital Signs:  Patient profile:   77 year old male Height:      71 inches Weight:      193 pounds BMI:     27.02 O2 Sat:      97 % on Room air Temp:     96.9 degrees F oral Pulse rate:   88 / minute BP sitting:   154 / 96  (right arm) Cuff size:   regular  Vitals Entered By: Boone Master CNA (October 09, 2009 3:08 PM)  O2 Flow:  Room air CC: elevated BP x3 days w/ HA Is Patient Diabetic? No Comments Medications reviewed with patient Daytime contact number verified with patient. Boone Master CNA  October 09, 2009 3:09 PM     Impression & Recommendations:  Problem # 1:  ESSENTIAL HYPERTENSION (ICD-401.9)  Not optimally controlled  REC: check labs w/ bmet Begin Diovan HCT 160/12.5mg  1 by mouth once daily  Low salt diet.  Avoid over the counter meds -NSAIDS-motrin/ibuprofen/aleve, etc and decongestants.  Keep blood pressure log and bring to each visit. call if b/p <90 or >160.  Saline nasal rinses as needed  Claritin 10mg  once daily as needed nasal drainage Please contact office for sooner follow up if symptoms do not improve or worsen  follow up 1 month His updated medication list for this problem includes:    Atenolol 25 Mg Tabs (Atenolol) .Marland Kitchen... Take 1 tablet by mouth two times a day    Diovan Hct 160-12.5 Mg Tabs (Valsartan-hydrochlorothiazide) .Marland Kitchen... 1 by mouth once  daily  Orders: TLB-BMP (Basic Metabolic Panel-BMET) (80048-METABOL) Est. Patient Level IV (91478)  Medications Added to Medication List This Visit: 1)  Proair Hfa 108 (90 Base) Mcg/act Aers (Albuterol sulfate) .... Inhale 2 puffs every four hours as needed 2)  Mucinex Dm 30-600 Mg Xr12h-tab (Dextromethorphan-guaifenesin) .... Take 1-2 tablets every 12 hours as needed 3)  Fish Oil 1000 Mg Caps (Omega-3 fatty acids) .... Take 1 capsule by mouth once a day 4)  Meloxicam 7.5 Mg Tabs (Meloxicam) .... Take 1 tablet by mouth every morning 5)  Meloxicam 15 Mg Tabs (Meloxicam) .... Take 1 tab by mouth at bedtime 6)  Cranberry Concentrate 500 Mg Caps (Cranberry) .... Take 1 capsule by mouth once a day 7)  Urea 40 % Gel (Urea) .... Apply to toenail two times a day 8)  Diovan Hct 160-12.5 Mg Tabs (Valsartan-hydrochlorothiazide) .Marland Kitchen.. 1 by mouth once daily  Complete Medication List: 1)  Fluticasone Propionate 50 Mcg/act Susp (Fluticasone propionate) .Marland Kitchen.. 1-2 sp in each nostril at bedtime.Marland KitchenMarland Kitchen 2)  Magic Mouthwash  .... 1 tsp gargle and swallow as needed... 3)  Tussionex Pennkinetic Er 8-10 Mg/63ml Lqcr (Chlorpheniramine-hydrocodone) .Marland Kitchen.. 1 tsp two times a day as needed cough 4)  Advair Diskus 500-50 Mcg/dose Misc (Fluticasone-salmeterol) .... Take one puff twice daily 5)  Proair Hfa 108 (90 Base) Mcg/act Aers (Albuterol sulfate) .... Inhale 2 puffs every four hours as needed 6)  Mucinex Dm 30-600 Mg Xr12h-tab (Dextromethorphan-guaifenesin) .... Take 1-2 tablets every 12 hours as needed 7)  Bayer Low Strength 81 Mg Tbec (Aspirin) .... Take one pill by mouth once daily 8)  Atenolol 25 Mg Tabs (Atenolol) .... Take 1 tablet by mouth two times a day 9)  Fish Oil 1000 Mg Caps (Omega-3  fatty acids) .... Take 1 capsule by mouth once a day 10)  Omeprazole 20 Mg Tbec (Omeprazole) .... Take one pill by mouth once daily 11)  Ranitidine Hcl 150 Mg Caps (Ranitidine hcl) .... Take 1 tablet by mouth once a day 12)   Avodart 0.5 Mg Caps (Dutasteride) .... Take one pill by mouth once daily 13)  Flomax 0.4 Mg Cp24 (Tamsulosin hcl) .... Take two pills by mouth once daily 14)  Sanctura 20 Mg Tabs (Trospium chloride) .... Take one pill by mouth twice daily 15)  Meloxicam 7.5 Mg Tabs (Meloxicam) .... Take 1 tablet by mouth every morning 16)  Ambien 10 Mg Tabs (Zolpidem tartrate) .... Take 1 tab by mouth at bedtime.... 17)  Meloxicam 15 Mg Tabs (Meloxicam) .... Take 1 tab by mouth at bedtime 18)  Cranberry Concentrate 500 Mg Caps (Cranberry) .... Take 1 capsule by mouth once a day 19)  Urea 40 % Gel (Urea) .... Apply to toenail two times a day 20)  Diovan Hct 160-12.5 Mg Tabs (Valsartan-hydrochlorothiazide) .Marland Kitchen.. 1 by mouth once daily  Patient Instructions: 1)  Begin Diovan HCT 160/12.5mg  1 by mouth once daily  2)  Low salt diet.  3)  Avoid over the counter meds -NSAIDS-motrin/ibuprofen/aleve, etc and decongestants.  4)  Keep blood pressure log and bring to each visit. call if b/p <90 or >160.  5)  Saline nasal rinses as needed  6)  Claritin 10mg  once daily as needed nasal drainage 7)  Please contact office for sooner follow up if symptoms do not improve or worsen  8)  follow up 1 month

## 2010-09-02 NOTE — Assessment & Plan Note (Signed)
Summary: rov//mbw   Primary Provider/Referring Provider:  Kriste Basque, MD  CC:  F/U on BP - Denies HA's or dizziness.  History of Present Illness: 77  y/o WM with known hx of COPD,    January 31, 2009 --Presents for an acute office visit. bitten by cat last PM about 9 on right, wrist, now red, swollen, hot to touch, Fever today. Swelling overnight. Last Td booster >5 yrs. Cat got mad at him and bit him when he picked it up. Personal pet, shots up to date. Denies chest pain, dyspnea, orthopnea, hemoptysis, fever, n/v/d, edema, headache,drainage, bleeding. Cleaned w/ soap/water and applied neosporin.     ~  March 07, 2009:  routine f/u visit doing reasonably well without new complaints or concerns, but he describes an episode of choking several weeks ago while eating fruit salad at [employer]- "coughed up 3 wads of thick phlegm & a tiny piece of appleskin"...  he also had a cat bite w/ cellulitis right hand req 3d in hosp for IV antibiotics by DrSypher...   ~  August 19, 2009:  generally stable- he had a good Christmas- no new complaints or concerns...  saw DrKatz 9/10- known nonobstructive CAD  October 09, 2009--Presents for work in visit. elevated BP x3 days w/ Headache.  pt has brought BP log with him today. b/p running 150-180/90. Feels okay, but had intermittent frontal headache. Denies visual /speech changes, ext. weakness, chest pain, dyspnea. No OTC or new meds. Review shows b/p trending up at last visit 2 months ago.    November 12, 2009 --Presents for follow up of b/p. Last visit, b/p trending up , Diovan 160/12.5mg  added. He has tolerated except b/p has  been on lower end of nml, does feel mild lightheadness when he stands. b/p avg  ~90-100. Denies chest pain, dyspnea, orthopnea, hemoptysis, fever, n/v/d, edema, headache.      Current Medications (verified): 1)  Fluticasone Propionate 50 Mcg/act Susp (Fluticasone Propionate) .Marland Kitchen.. 1-2 Sp in Each Nostril At Bedtime.Marland KitchenMarland Kitchen 2)  Magic Mouthwash .... 1 Tsp  Gargle and Swallow As Needed... 3)  Tussionex Pennkinetic Er 8-10 Mg/38ml Lqcr (Chlorpheniramine-Hydrocodone) .Marland Kitchen.. 1 Tsp Two Times A Day As Needed Cough 4)  Advair Diskus 500-50 Mcg/dose  Misc (Fluticasone-Salmeterol) .... Take One Puff Twice Daily 5)  Proair Hfa 108 (90 Base) Mcg/act Aers (Albuterol Sulfate) .... Inhale 2 Puffs Every Four Hours As Needed 6)  Mucinex Dm 30-600 Mg Xr12h-Tab (Dextromethorphan-Guaifenesin) .... Take 1-2 Tablets Every 12 Hours As Needed 7)  Bayer Low Strength 81 Mg  Tbec (Aspirin) .... Take One Pill By Mouth Once Daily 8)  Atenolol 25 Mg Tabs (Atenolol) .... Take 1 Tablet By Mouth Two Times A Day 9)  Fish Oil 1000 Mg Caps (Omega-3 Fatty Acids) .... Take 1 Capsule By Mouth Once A Day 10)  Omeprazole 20 Mg  Tbec (Omeprazole) .... Take One Pill By Mouth Once Daily 11)  Ranitidine Hcl 150 Mg Caps (Ranitidine Hcl) .... Take 1 Tablet By Mouth Once A Day 12)  Avodart 0.5 Mg  Caps (Dutasteride) .... Take One Pill By Mouth Once Daily 13)  Flomax 0.4 Mg  Cp24 (Tamsulosin Hcl) .... Take Two Pills By Mouth Once Daily 14)  Sanctura 20 Mg  Tabs (Trospium Chloride) .... Take One Pill By Mouth Twice Daily 15)  Meloxicam 7.5 Mg  Tabs (Meloxicam) .... Take 1 Tablet By Mouth Every Morning 16)  Ambien 10 Mg  Tabs (Zolpidem Tartrate) .... Take 1 Tab By Mouth At Bedtime.Marland KitchenMarland KitchenMarland Kitchen  17)  Meloxicam 15 Mg Tabs (Meloxicam) .... Take 1 Tab By Mouth At Bedtime 18)  Cranberry Concentrate 500 Mg Caps (Cranberry) .... Take 1 Capsule By Mouth Once A Day 19)  Urea 40 % Gel (Urea) .... Apply To Toenail Two Times A Day 20)  Diovan Hct 160-12.5 Mg Tabs (Valsartan-Hydrochlorothiazide) .Marland Kitchen.. 1 By Mouth Once Daily  Allergies (verified): 1)  ! * Ivp Dye  Past History:  Past Medical History: Last updated: 08/19/2009 ALLERGIC RHINITIS (ICD-477.9) COPD (ICD-496) DISORDERS OF DIAPHRAGM (ICD-519.4) Hx of BRONCHITIS, ACUTE (ICD-466.0) CORONARY ARTERY DISEASE (ICD-414.00).... mild.. by catheterization in June,  2009. .. this catheterization followed a nuclear scan with question of mild lateral ischemia and a hypotensive response to exercise. Ejection fraction 55-60%... echo. june, 2009 Aortic stenosis.. mild.Marland Kitchen echo and catheterization June, 2009 Mitral stenosis...?????very mild by echo related to mitral annular calcification RIGHT BUNDLE BRANCH BLOCK (ICD-426.4) VALVULAR HEART DISEASE (ICD-424.90) Hx of FLUTTER, ATRIAL (ICD-427.32)....ablated in the past with no recurrence VENOUS INSUFFICIENCY (ICD-459.81) EDEMA (ICD-782.3) HYPERCHOLESTEROLEMIA (ICD-272.0)...patient hesitant to use statin. GERD (ICD-530.81) DIVERTICULOSIS OF COLON (ICD-562.10) HYPERTROPHY PROSTATE W/UR OBST & OTH LUTS (ICD-600.01) PSA, INCREASED (ICD-790.93) DEGENERATIVE JOINT DISEASE (ICD-715.90) INSOMNIA, CHRONIC (ICD-307.42) MALIGNANT MELANOMA, HX OF (ICD-V10.82)..mediastinal removed via thoracotomy td booster January 31, 2009   Past Surgical History: Last updated: 08/19/2009 S/P bialt inguinal hernia repairs S/P umbilical hernia repair  Family History: Last updated: 03/07/2009 Mother - arthritis Sister - lung cancer Brother - dec'd at age 82 - firefighter  Social History: Last updated: 03/07/2009 Married, wife= Evelyn 6 children- 2 biological, 4 adopted ex-smoker, quit 40 yrs social alcohol retired [employer], [employer]...  Risk Factors: Smoking Status: quit (05/14/2007)  Past Pulmonary History:  Pulmonary History: PULMONARY HX  ALLERGIC RHINITIS (ICD-477.9) - uses OTC antihist Prn & Flonase spray...  COPD (ICD-496) & DISORDERS OF DIAPHRAGM (ICD-519.4) - he is an ex-smoker- having smoked <27yrs, and quit >27yrs ago.Marland KitchenMarland KitchenADVAIR 500 Prn, ALBUTEROL Prn, MUCINEX 2Bid Prn... otherwise doing well, mows yard etc... still says he needs the TUSSIONEX for cough...   ~  SCANS:  9/03= elev L hemidaiph w/ scarring at base, & RLL infiltrate resolved...  ~  baseline CXR 2/08 - s/p median sternotomy (mediastinal melanoma surg  1980's), elev L hemidiaph & chr changes, NAD.Marland Kitchen.  ~  f/u CXR 03/13/08 = similar chr changes on the left...  ~  PFT 1/06 - FVC 2.46 (54%), FEV1= 1.64 (46%), %1sec=67, mid-flows=20%. FEV1 in 1998 was 1.89.  ~  PFT 8/09 showed FVC= 2.61 (58%), FEV1= 1.73 (49%), %1sec=66, mid-flows= 29%...  ~  CXR 1/11 showed stable COPD, NAD...  CORONARY ARTERY DISEASE (ICD-414.00) - takes ATENOLOL 50mg /d, and ASA 81mg /d...   ~   min non-obstructive CAD on cath 7/03 w/ luminal irregs to 30% in Circ...  ~  cath 6/09 showed 30-40% LAD, 40-50% RCA, & 20% CIRC = non-obstructive CAD  ~  last OV DrKatz yearly-  seen 9/10 & stable.  RIGHT BUNDLE BRANCH BLOCK (ICD-426.4) - baseline EKG is NSR, RBBB...  VALVULAR HEART DISEASE (ICD-424.90) & Hx of FLUTTER, ATRIAL (ICD-427.32) - s/p cath ablation in 2003 by DrTaylor...  ~  2DEcho 2/06 showed conc LVH, sclerotic AoV w/o AS, low-norm LVF...  ~  2DEcho 6/09 showed mod calcif AoV w/ reduced leaflet excursion c/w mild AS, mod mitral annular calcif w/ mild MS, dil LA & RV, mild incr PA sys ~ 40, norm LVF w/ EF=55-60%  VENOUS INSUFFICIENCY (ICD-459.81) w/ EDEMA (ICD-782.3) - treats w/ low sodium diet, not currently  on diuretic Rx... BNP= 114 in Jun08... exam shows superfic VV, no signif edema, etc...  HYPERCHOLESTEROLEMIA (ICD-272.0) - on diet + FISH OIL...  ~  FLP 6/08 showed TChol 180, TG 37, HDL 52, LDL 121... he was not interested in Statin therapy.  ~  FLP 4/10 showed TChol 189, TG 34, HDL 53, LDL 130... not at goal- needs meds/ he pre  Vital Signs:  Patient profile:   77 year old male Height:      72 inches Weight:      190.13 pounds O2 Sat:      95 % on Room air Temp:     97.1 degrees F oral Pulse rate:   99 / minute BP sitting:   108 / 62  (left arm) Cuff size:   regular  Vitals Entered By: Abigail Miyamoto RN (November 12, 2009 9:45 AM)  O2 Flow:  Room air  Physical Exam  Additional Exam:  WD, WN, 77y/o WM in NAD... GENERAL:  Alert & oriented; pleasant &  cooperative... HEENT:  [place]/AT, EACs-clear, TMs-wnl, NOSE-clear, THROAT-clear & wnl. NECK:  Supple w/ full ROM; no JVD; normal carotid impulses w/o bruits; no thyromegaly or nodules palpated; no lymphadenopathy. CHEST:  Decr BS in bases , clear without wheezes/ rales/ or rhonchi, s/p median sternotomy. HEART:  regular rhythm; gr 2/6 sys murmur, without rubs of gallops appreciated... ABDOMEN:  Soft & nontender; normal bowel sounds; no organomegaly or masses detected. EXT: without deformities, mild arthritic changes, +ven insuffic w/ superfic VV- NEURO:  CN's intact; motor testing normal; sensory testing normal; gait normal & balance OK.     Impression & Recommendations:  Problem # 1:  ESSENTIAL HYPERTENSION (ICD-401.9) Improved control on diovan, however may be overcompensated w/ orthostatic changes.  REC:  Decrease Diovan HCT 160/12.5mg  1/2 once daily   Low salt diet.  Avoid over the counter meds -NSAIDS-motrin/ibuprofen/aleve, etc and decongestants.  follow up Dr. Kriste Basque in 3 months and as needed    His updated medication list for this problem includes:    Atenolol 25 Mg Tabs (Atenolol) .Marland Kitchen... Take 1 tablet by mouth two times a day    Diovan Hct 160-12.5 Mg Tabs (Valsartan-hydrochlorothiazide) .Marland Kitchen... 1/2 by  mouth once daily  Orders: Est. Patient Level III (32440) Prescription Created Electronically [phone])  Medications Added to Medication List This Visit: 1)  Diovan Hct 160-12.5 Mg Tabs (Valsartan-hydrochlorothiazide) .... 1/2 by  mouth once daily  Complete Medication List: 1)  Fluticasone Propionate 50 Mcg/act Susp (Fluticasone propionate) .Marland Kitchen.. 1-2 sp in each nostril at bedtime.Marland KitchenMarland Kitchen 2)  Magic Mouthwash  .... 1 tsp gargle and swallow as needed... 3)  Tussionex Pennkinetic Er 8-10 Mg/27ml Lqcr (Chlorpheniramine-hydrocodone) .Marland Kitchen.. 1 tsp two times a day as needed cough 4)  Advair Diskus 500-50 Mcg/dose Misc (Fluticasone-salmeterol) .... Take one puff twice daily 5)  Proair Hfa 108 (90  Base) Mcg/act Aers (Albuterol sulfate) .... Inhale 2 puffs every four hours as needed 6)  Mucinex Dm 30-600 Mg Xr12h-tab (Dextromethorphan-guaifenesin) .... Take 1-2 tablets every 12 hours as needed 7)  Bayer Low Strength 81 Mg Tbec (Aspirin) .... Take one pill by mouth once daily 8)  Atenolol 25 Mg Tabs (Atenolol) .... Take 1 tablet by mouth two times a day 9)  Fish Oil 1000 Mg Caps (Omega-3 fatty acids) .... Take 1 capsule by mouth once a day 10)  Omeprazole 20 Mg Tbec (Omeprazole) .... Take one pill by mouth once daily 11)  Ranitidine Hcl 150 Mg Caps (Ranitidine hcl) .... Take 1  tablet by mouth once a day 12)  Avodart 0.5 Mg Caps (Dutasteride) .... Take one pill by mouth once daily 13)  Flomax 0.4 Mg Cp24 (Tamsulosin hcl) .... Take two pills by mouth once daily 14)  Sanctura 20 Mg Tabs (Trospium chloride) .... Take one pill by mouth twice daily 15)  Meloxicam 7.5 Mg Tabs (Meloxicam) .... Take 1 tablet by mouth every morning 16)  Ambien 10 Mg Tabs (Zolpidem tartrate) .... Take 1 tab by mouth at bedtime.... 17)  Meloxicam 15 Mg Tabs (Meloxicam) .... Take 1 tab by mouth at bedtime 18)  Cranberry Concentrate 500 Mg Caps (Cranberry) .... Take 1 capsule by mouth once a day 19)  Urea 40 % Gel (Urea) .... Apply to toenail two times a day 20)  Diovan Hct 160-12.5 Mg Tabs (Valsartan-hydrochlorothiazide) .... 1/2 by  mouth once daily  Patient Instructions: 1)  Decrease Diovan HCT 160/12.5mg  1/2 once daily  2)   Low salt diet.  3)  Avoid over the counter meds -NSAIDS-motrin/ibuprofen/aleve, etc and decongestants.  4)  follow up Dr. Kriste Basque in 3 months and as needed  5)    Prescriptions: DIOVAN HCT 160-12.5 MG TABS (VALSARTAN-HYDROCHLOROTHIAZIDE) 1/2 by  mouth once daily  #30 x 6   Entered and Authorized by:   Rubye Oaks NP   Signed by:   Donnice Nielsen NP on 11/12/2009   Method used:   Electronically to        [facility]  [address] [id number] (retail)       [address]       [place], [place]  [address]       Ph: [phone] or [phone]       Fax: [phone]   RxID:   [id number]

## 2010-09-02 NOTE — Assessment & Plan Note (Signed)
Summary: flu shot/apc   Nurse Visit    Prior Medications: ADVAIR DISKUS 500-50 MCG/DOSE  MISC (FLUTICASONE-SALMETEROL) take one puff twice daily ALBUTEROL 90 MCG/ACT  AERS (ALBUTEROL) take two puffs every four to six hours as needed MUCINEX D 60-600 MG  TB12 (PSEUDOEPHEDRINE-GUAIFENESIN) Take 1 tablet by mouth once a day TUSSIONEX PENNKINETIC ER 8-10 MG/5ML  LQCR (CHLORPHENIRAMINE-HYDROCODONE) take one tsp every twelve hours as needed BAYER LOW STRENGTH 81 MG  TBEC (ASPIRIN) take one pill by mouth once daily ATENOLOL 50 MG  TABS (ATENOLOL) Take 1/2  tablet by mouth once a day OMEPRAZOLE 20 MG  TBEC (OMEPRAZOLE) take one pill by mouth once daily ZANTAC 150 MG  TABS (RANITIDINE HCL) take one pill by mouth once daily AVODART 0.5 MG  CAPS (DUTASTERIDE) take one pill by mouth once daily FLOMAX 0.4 MG  CP24 (TAMSULOSIN HCL) take two pills by mouth once daily SANCTURA 20 MG  TABS (TROSPIUM CHLORIDE) take one pill by mouth twice daily MELOXICAM 7.5 MG  TABS (MELOXICAM) take one pill by mouth once daily AMBIEN 10 MG  TABS (ZOLPIDEM TARTRATE) take 1 tab by mouth at bedtime.... MAGIC MOUTHWASH () 1 tsp gargle and swallow as needed... Current Allergies: ! * IVP DYE    Orders Added: 1)  Admin 1st Vaccine [90471] 2)  Flu Vaccine 17yrs + [03474]   Flu Vaccine Consent Questions     Do you have a history of severe allergic reactions to this vaccine? no    Any prior history of allergic reactions to egg and/or gelatin? no    Do you have a sensitivity to the preservative Thimersol? no    Do you have a past history of Guillan-Barre Syndrome? no    Do you currently have an acute febrile illness? no    Have you ever had a severe reaction to latex? no    Vaccine information given and explained to patient? yes    Are you currently pregnant? no    Lot Number:[id number]   Site Given  Left Deltoid IM.opcflu Vernie Murders  April 25, 2008 5:05 PM

## 2010-09-02 NOTE — Assessment & Plan Note (Signed)
Summary: Z6X      Allergies Added:   Visit Type:  Follow-up Primary Provider:  Kriste Basque, MD  CC:  CAD.  History of Present Illness: The patient is seen for followup of coronary artery disease.  Stable.  He is not having chest pain or shortness of breath.  He had an abnormal nuclear study in 2009 followed by cardiac catheterization very this showed only mild disease.  He had a mild hypotensive response to exercise in the past but he has been quite stable.  Ejection fraction is 60%.  Current Medications (verified): 1)  Fluticasone Propionate 50 Mcg/act Susp (Fluticasone Propionate) .Marland Kitchen.. 1-2 Sp in Each Nostril At Bedtime.Marland KitchenMarland Kitchen 2)  Magic Mouthwash .... 1 Tsp Gargle and Swallow As Needed... 3)  Tussionex Pennkinetic Er 8-10 Mg/28ml Lqcr (Chlorpheniramine-Hydrocodone) .Marland Kitchen.. 1 Tsp Two Times A Day As Needed Cough 4)  Advair Diskus 500-50 Mcg/dose  Misc (Fluticasone-Salmeterol) .... Take One Puff Twice Daily 5)  Proair Hfa 108 (90 Base) Mcg/act Aers (Albuterol Sulfate) .... Inhale 2 Puffs Every Four Hours As Needed 6)  Mucinex Dm 30-600 Mg Xr12h-Tab (Dextromethorphan-Guaifenesin) .... Take 1-2 Tablets Every 12 Hours As Needed 7)  Bayer Low Strength 81 Mg  Tbec (Aspirin) .... Take One Pill By Mouth Once Daily 8)  Atenolol 25 Mg Tabs (Atenolol) .... Take 1 Tablet By Mouth Two Times A Day 9)  Diovan Hct 160-12.5 Mg Tabs (Valsartan-Hydrochlorothiazide) .... 1/2 By  Mouth Once Daily 10)  Fish Oil 1000 Mg Caps (Omega-3 Fatty Acids) .... Take 1 Capsule By Mouth Once A Day 11)  Omeprazole 20 Mg  Tbec (Omeprazole) .... Take One Pill By Mouth Once Daily 12)  Ranitidine Hcl 150 Mg Caps (Ranitidine Hcl) .... Take 1 Tablet By Mouth Once A Day 13)  Miralax  Powd (Polyethylene Glycol 3350) .... Once Daily 14)  Avodart 0.5 Mg  Caps (Dutasteride) .... Take One Pill By Mouth Once Daily 15)  Flomax 0.4 Mg  Cp24 (Tamsulosin Hcl) .... Take Two Pills By Mouth Once Daily 16)  Sanctura 20 Mg  Tabs (Trospium Chloride) .... Take  One Pill By Mouth Twice Daily 17)  Meloxicam 7.5 Mg  Tabs (Meloxicam) .... Take 1 Tablet By Mouth Every Morning 18)  Ambien 10 Mg  Tabs (Zolpidem Tartrate) .... Take 1 Tab By Mouth At Bedtime.... 19)  Cranberry Concentrate 500 Mg Caps (Cranberry) .... Take 1 Capsule By Mouth Once A Day  Allergies (verified): 1)  ! * Ivp Dye  Past History:  Past Medical History: ALLERGIC RHINITIS (ICD-477.9) COPD (ICD-496) DISORDERS OF DIAPHRAGM (ICD-519.4) Hx of BRONCHITIS, ACUTE (ICD-466.0) ESSENTIAL HYPERTENSION (ICD-401.9) CORONARY ARTERY DISEASE (ICD-414.00).... mild.. by catheterization in June, 2009. .. this catheterization followed a nuclear scan with question of mild lateral ischemia and a hypotensive response to exercise. Ejection fraction 55-60%... echo. june, 2009 Aortic stenosis.. mild.Marland Kitchen echo and catheterization June, 2009 Mitral stenosis...?????very mild by echo related to mitral annular calcification RIGHT BUNDLE BRANCH BLOCK (ICD-426.4) VALVULAR HEART DISEASE (ICD-424.90) Hx of FLUTTER, ATRIAL (ICD-427.32)....ablated in the past with no recurrence VENOUS INSUFFICIENCY (ICD-459.81) EDEMA (ICD-782.3) HYPERCHOLESTEROLEMIA (ICD-272.0)...patient hesitant to use statin. GERD (ICD-530.81) DIVERTICULOSIS OF COLON (ICD-562.10) HYPERTROPHY PROSTATE W/UR OBST & OTH LUTS (ICD-600.01) PSA, INCREASED (ICD-790.93) DEGENERATIVE JOINT DISEASE (ICD-715.90) INSOMNIA, CHRONIC (ICD-307.42) MALIGNANT MELANOMA, HX OF (ICD-V10.82)..mediastinal removed via thoracotomy td booster January 31, 2009   Review of Systems       The patient denies fever, chills, headache, sweats, rash, change in vision, change in hearing, chest pain, cough, nausea vomiting, urinary symptoms.all  other systems are reviewed and are negative.  Vital Signs:  Patient profile:   77 year old male Height:      72 inches Weight:      186 pounds BMI:     25.32 Pulse rate:   92 / minute BP sitting:   114 / 68  (left arm) Cuff size:    regular  Vitals Entered By: Hardin Negus, RMA (April 03, 2010 8:55 AM)  Physical Exam  General:  patient looks good. Eyes:  no xanthelasma. Neck:  no jugular venous distention. Lungs:  lungs are clear. respiratory effort is not labored. Heart:  cardiac exam reveals S1 and S2.  There is a soft systolic murmur. Abdomen:  abdomen is soft. Extremities:  no peripheral edema. Psych:  patient is oriented to person time and place.  Affect is normal.   Impression & Recommendations:  Problem # 1:  ESSENTIAL HYPERTENSION (ICD-401.9)  His updated medication list for this problem includes:    Bayer Low Strength 81 Mg Tbec (Aspirin) .Marland Kitchen... Take one pill by mouth once daily    Atenolol 25 Mg Tabs (Atenolol) .Marland Kitchen... Take 1 tablet by mouth two times a day    Diovan Hct 160-12.5 Mg Tabs (Valsartan-hydrochlorothiazide) .Marland Kitchen... 1/2 by  mouth once daily Blood pressure control.  No change in therapy.  Problem # 2:  MITRAL STENOSIS (ICD-394.0) The patient has mild valvular disease by echo.  He does not need a followup echo at this time.  Most likely we will want one in one year.  Problem # 3:  CORONARY ARTERY DISEASE (ICD-414.00)  His updated medication list for this problem includes:    Bayer Low Strength 81 Mg Tbec (Aspirin) .Marland Kitchen... Take one pill by mouth once daily    Atenolol 25 Mg Tabs (Atenolol) .Marland Kitchen... Take 1 tablet by mouth two times a day   Coronary disease is mild and stable.  EKG is done today and reviewed by me.  There is no right bundle branch block and no change.  Followup in one year.  Orders: EKG w/ Interpretation (93000)  Patient Instructions: 1)  Your physician recommends that you continue on your current medications as directed. Please refer to the Current Medication list given to you today. 2)  Your physician wants you to follow-up in: 1 YEAR.  You will receive a reminder letter in the mail two months in advance. If you don't receive a letter, please call our office to schedule  the follow-up appointment.

## 2010-09-02 NOTE — Assessment & Plan Note (Signed)
Summary: flu shot/jd   Nurse Visit   Allergies: 1)  ! * Ivp Dye  Orders Added: 1)  Flu Vaccine 29yrs + MEDICARE PATIENTS [Q2039] 2)  Administration Flu vaccine - MCR [G0008] Flu Vaccine Consent Questions     Do you have a history of severe allergic reactions to this vaccine? no    Any prior history of allergic reactions to egg and/or gelatin? no    Do you have a sensitivity to the preservative Thimersol? no    Do you have a past history of Guillan-Barre Syndrome? no    Do you currently have an acute febrile illness? no    Have you ever had a severe reaction to latex? no    Vaccine information given and explained to patient? yes    Are you currently pregnant? no    Lot Number:[id number]   Exp Date:01/31/2011   Site Given  Right Deltoid IMmedflu   Tammy Scott  May 12, 2010 10:12 AM

## 2010-09-02 NOTE — Progress Notes (Signed)
Summary: CMN  Phone Note Call from Patient   Caller: Son Call For: Delyla Sandeen Summary of Call: pt's son dropped off CMN for lumbar orthosis. call son mark Ripberger when he can pick this up (also left one for his mom-pt's spouse evelyn as well- msg in emr). i have given this to leigh. mark Shibuya [phone] Initial call taken by: Tivis Ringer, CNA,  February 24, 2010 1:00 PM  Follow-up for Phone Call        forwading msg to [facility]. Boone Master CNA/MA  February 24, 2010 1:02 PM    called and spoke with pts son mark and he is aware that form is up front and ready to be picked up. Randell Loop CMA  March 03, 2010 9:54 AM

## 2010-09-04 NOTE — Assessment & Plan Note (Signed)
Summary: 6 months/fasting/apc   Primary Care Kesley Mullens:  Kriste Basque, MD  CC:  49mo ROV & review of mult medical problems....  History of Present Illness: 77 y/o WM here for an add-on visit... he has mult medical problems as noted below...  Followed for general medical purposes w/ hx COPD, ex-smoker, elev right hemidiaph, CAD & mild AS followed by Delton See, Hx AFlutter s/p ablation in 2003 by DrTaylor, Hypercholesterolemia on diet alone, BPH on 3 meds per DrPeterson, and hx MM in mediastinum 1980's (no recurrence)...   ~  August 19, 2009:  generally stable- he had a good Christmas- no new complaints or concerns...  saw DrKatz 9/10- known nonobstructive CAD, mild AS & min MS, RBBB, hx AFlutter ablation> no change & f/u 19yr planned...    ~  January 30, 2010:  he notes some dizziness but on careful questioning he is describing dizzinees after bending/ straining/ w/ valsalva maneuver> we discussed this physiology & he understands...  breathing has been stable;  no CP/ palpit/ ch in dyspnea/ etc;  GI remians stable on meds;  GU followed by DrPeterson on 3 meds- stable;  no new complaints or concerns...   ~  July 31, 2010:  49mo ROV- he continues to do well w/o new complaints or concerns...he saw DrKatz 9/11- HBP, CAD, RBBB, MS> all stable & no changes made... he had f/u DrPeterson Urology 10/11 w/ PSA reported at 5.28 (19% free) & they are continuing to follow... wants ZPak & Etodolac refilled today.    Current Problem List:  ALLERGIC RHINITIS (ICD-477.9) - uses OTC antihist Prn & Flonase spray...  COPD (ICD-496) & DISORDERS OF DIAPHRAGM (ICD-519.4) - he is an ex-smoker- having smoked <83yrs, and quit >67yrs ago... stable on ADVAIR 500 Prn, PROAIR Prn, MUCINEX 2Bid Prn... otherwise doing well, mows yard etc... still says he needs the TUSSIONEX for cough...   ~  SCANS:  9/03= elev L hemidaiph w/ scarring at base, & RLL infiltrate resolved...  ~  baseline CXR 2/08 - s/p median sternotomy (mediastinal  melanoma surg 1980's), elev L hemidiaph & chr changes, NAD.Marland Kitchen.  ~  f/u CXR 03/13/08 = similar chr changes on the left...  ~  PFT 1/06 - FVC 2.46 (54%), FEV1= 1.64 (46%), %1sec=67, mid-flows=20%. FEV1 in 1998 was 1.89.  ~  PFT 8/09 showed FVC= 2.61 (58%), FEV1= 1.73 (49%), %1sec=66, mid-flows= 29%...  ~  CXR 1/11 showed stable COPD, NAD...  ESSENTIAL HYPERTENSION (ICD-401.9) - he's been on ATENOLOL 50mg /d, & BP noted to be mildly elevated 4/11 and started on DIOVAN/ HCT 160-12.5 (taking 1/2 daily) w/ improvement.  ~  6/11:  BP= 110/60 & similar at home... offered to ch Diovan but he prefers to continue 1/2 tab daily.  CORONARY ARTERY DISEASE (ICD-414.00) - takes ATENOLOL 50mg /d, & ASA 81mg /d... followed by Delton See & his notes are reviewed.  ~   min non-obstructive CAD on cath 7/03 w/ luminal irregs to 30% in Circ...  ~  cath 6/09 showed 30-40% LAD, 40-50% RCA, & 20% CIRC = non-obstructive CAD  RIGHT BUNDLE BRANCH BLOCK (ICD-426.4) - baseline EKG is NSR, RBBB...  VALVULAR HEART DISEASE (ICD-424.90) & Hx of FLUTTER, ATRIAL (ICD-427.32) - s/p cath ablation in 2003 by DrTaylor...  ~  2DEcho 2/06 showed conc LVH, sclerotic AoV w/o AS, low-norm LVF...  ~  2DEcho 6/09 showed mod calcif AoV w/ reduced leaflet excursion c/w mild AS, mod mitral annular calcif w/ mild MS, dil LA & RV, mild incr PA sys ~ 40, norm LVF  w/ EF=55-60%  VENOUS INSUFFICIENCY (ICD-459.81) w/ EDEMA (ICD-782.3) - treats w/ low sodium diet... BNP= 114 in Jun08... exam shows superfic VV, no signif edema, etc...  HYPERCHOLESTEROLEMIA (ICD-272.0) - on diet + FISH OIL...  ~  FLP 6/08 showed TChol 180, TG 37, HDL 52, LDL 121... he was not interested in Statin therapy.  ~  FLP 4/10 showed TChol 189, TG 34, HDL 53, LDL 130... not at goal- needs meds/ he prefers diet.  ~  FLP 1/11 showed TChol 195, TG 38, HDL 54, LDL 133... needs better diet.  ~  FLP 12/11 showed TChol 187, TG 35, HDL 56, LDL 124  GERD (ICD-530.81) - on OMEPRAZOLE 20mg /d,  and ZANTAC 150mg Qhs...  DIVERTICULOSIS OF COLON (ICD-562.10) - notes some constipation Rx w/ MIRALAX...  ~  last colonoscopy 2/05 by DrPatterson showed divertics only...   HYPERTROPHY PROSTATE W/UR OBST & OTH LUTS (ICD-600.01) - treated by DrPeterson w/ AVODART 0.5mg /d, FLOMAX 0.4mg Bid, & SANCTURA 20mg Bid... Urology follows his PSAs.   PSA, INCREASED (ICD-790.93) - pt states that PSA was as high as 9 and improved to 5.6.Marland KitchenMarland Kitchen pt reports recent eval for hematuria- note pending...   ~  10/10: he reports his PSA 10/10 DrPeterson was 5.3  ~  6/11: he reports that his PSA recently = 5.1  ~  12/11: PSA per DrPeterson recently = 5.28 & they continue to follow.  DEGENERATIVE JOINT DISEASE (ICD-715.90) - on Mobic but he prefers ETODOLAC 400mg  Bid Prn.  INSOMNIA, CHRONIC (ICD-307.42) - uses AMBIEN 10mg  Qhs...  MALIGNANT MELANOMA, HX OF (ICD-V10.82) - s/p surgery of mediastinal melanoma 1980's... no known recurrence...   Current Medications (verified): 1)  Fluticasone Propionate 50 Mcg/act Susp (Fluticasone Propionate) .Marland Kitchen.. 1-2 Sp in Each Nostril At Bedtime.Marland KitchenMarland Kitchen 2)  Magic Mouthwash .... 1 Tsp Gargle and Swallow As Needed... 3)  Tussionex Pennkinetic Er 8-10 Mg/50ml Lqcr (Chlorpheniramine-Hydrocodone) .Marland Kitchen.. 1 Tsp Two Times A Day As Needed Cough 4)  Advair Diskus 500-50 Mcg/dose  Misc (Fluticasone-Salmeterol) .... Take One Puff Twice Daily 5)  Proair Hfa 108 (90 Base) Mcg/act Aers (Albuterol Sulfate) .... Inhale 2 Puffs Every Four Hours As Needed 6)  Mucinex Dm 30-600 Mg Xr12h-Tab (Dextromethorphan-Guaifenesin) .... Take 1-2 Tablets Every 12 Hours As Needed 7)  Bayer Low Strength 81 Mg  Tbec (Aspirin) .... Take One Pill By Mouth Once Daily 8)  Atenolol 25 Mg Tabs (Atenolol) .... Take 1 Tablet By Mouth Two Times A Day 9)  Diovan Hct 160-12.5 Mg Tabs (Valsartan-Hydrochlorothiazide) .... 1/2 By  Mouth Once Daily 10)  Fish Oil 1000 Mg Caps (Omega-3 Fatty Acids) .... Take 1 Capsule By Mouth Once A Day 11)   Omeprazole 20 Mg  Tbec (Omeprazole) .... Take One Pill By Mouth Once Daily 12)  Ranitidine Hcl 150 Mg Caps (Ranitidine Hcl) .... Take 1 Tablet By Mouth Once A Day 13)  Miralax  Powd (Polyethylene Glycol 3350) .... Once Daily 14)  Avodart 0.5 Mg  Caps (Dutasteride) .... Take One Pill By Mouth Once Daily 15)  Flomax 0.4 Mg  Cp24 (Tamsulosin Hcl) .... Take Two Pills By Mouth Once Daily 16)  Sanctura 20 Mg  Tabs (Trospium Chloride) .... Take One Pill By Mouth Twice Daily 17)  Meloxicam 7.5 Mg  Tabs (Meloxicam) .... Take 1 Tablet By Mouth Every Morning 18)  Ambien 10 Mg  Tabs (Zolpidem Tartrate) .... Take 1 Tab By Mouth At Bedtime.... 19)  Cranberry Concentrate 500 Mg Caps (Cranberry) .... Take 1 Capsule By Mouth Once A Day 20)  Etodolac  400 Mg Tabs (Etodolac) .... Once Daily  Allergies (verified): 1)  ! * Ivp Dye  Comments:  Nurse/Medical Assistant: The patient's medications and allergies were reviewed with the patient and were updated in the Medication and Allergy Lists. Carver Fila  July 31, 2010 9:05 AM   Past History:  Past Medical History: ALLERGIC RHINITIS (ICD-477.9) COPD (ICD-496) DISORDERS OF DIAPHRAGM (ICD-519.4) Hx of BRONCHITIS, ACUTE (ICD-466.0) ESSENTIAL HYPERTENSION (ICD-401.9) CORONARY ARTERY DISEASE (ICD-414.00).... mild.. by catheterization in June, 2009. .. this catheterization followed a nuclear scan with question of mild lateral ischemia and a hypotensive response to exercise. Ejection fraction 55-60%... echo. june, 2009 Aortic stenosis.. mild.Marland Kitchen echo and catheterization June, 2009 Mitral stenosis...?????very mild by echo related to mitral annular calcification RIGHT BUNDLE BRANCH BLOCK (ICD-426.4) VALVULAR HEART DISEASE (ICD-424.90) Hx of FLUTTER, ATRIAL (ICD-427.32)....ablated in the past with no recurrence VENOUS INSUFFICIENCY (ICD-459.81) EDEMA (ICD-782.3) HYPERCHOLESTEROLEMIA (ICD-272.0)...patient hesitant to use statin. GERD (ICD-530.81) DIVERTICULOSIS  OF COLON (ICD-562.10) HYPERTROPHY PROSTATE W/UR OBST & OTH LUTS (ICD-600.01) PSA, INCREASED (ICD-790.93) DEGENERATIVE JOINT DISEASE (ICD-715.90) INSOMNIA, CHRONIC (ICD-307.42) MALIGNANT MELANOMA, HX OF (ICD-V10.82)..mediastinal removed via thoracotomy td booster January 31, 2009  Past Surgical History: S/P bialt inguinal hernia repairs S/P umbilical hernia repair  Family History: Reviewed history from 03/07/2009 and no changes required. Mother - arthritis Sister - lung cancer Brother - dec'd at age 31 - firefighter  Social History: Reviewed history from 03/07/2009 and no changes required. Married, wife= Evelyn 6 children- 2 biological, 4 adopted ex-smoker, quit 40 yrs social alcohol retired [employer], [employer]...  Review of Systems      See HPI       The patient complains of dyspnea on exertion.  The patient denies anorexia, fever, weight loss, weight gain, vision loss, decreased hearing, hoarseness, chest pain, syncope, peripheral edema, prolonged cough, headaches, hemoptysis, abdominal pain, melena, hematochezia, severe indigestion/heartburn, hematuria, incontinence, muscle weakness, suspicious skin lesions, transient blindness, difficulty walking, depression, unusual weight change, abnormal bleeding, enlarged lymph nodes, and angioedema.    Vital Signs:  Patient profile:   77 year old male Height:      72 inches Weight:      186.13 pounds O2 Sat:      98 % on Room air Temp:     97.5 degrees F oral Pulse rate:   94 / minute BP sitting:   122 / 68  (left arm) Cuff size:   regular  Vitals Entered By: Carver Fila (July 31, 2010 9:03 AM)  O2 Flow:  Room air CC: 36mo ROV & review of mult medical problems... Comments meds and allergies updated Phone number updated Carver Fila  July 31, 2010 9:04 AM      Physical Exam  Additional Exam:  WD, WN, 77 y/o WM in NAD... GENERAL:  Alert & oriented; pleasant & cooperative... HEENT:  [place]/AT, EOM-wnl, PERRLA, EACs-clear,  TMs-wnl, NOSE-clear, THROAT-clear & wnl. NECK:  Supple w/ fairROM; no JVD; normal carotid impulses w/o bruits; no thyromegaly or nodules palpated; no lymphadenopathy. CHEST:  Decr BS in bases , clear without wheezes/ rales/ or rhonchi, s/p median sternotomy. HEART:  regular rhythm; gr 2/6 sys murmur, without rubs of gallops appreciated... ABDOMEN:  Soft & nontender; normal bowel sounds; no organomegaly or masses detected. EXT: without deformities, mild arthritic changes, +ven insuffic w/ superfic VV- no c/c/edema... right hand wound healing nicely... NEURO:  CN's intact; motor testing normal; sensory testing normal; gait normal & balance OK. DERM:  no rash, no skin lesions noted...    MISC.  Report  Procedure date:  07/31/2010  Findings:      BMP (METABOL)   Sodium                    137 mEq/L                   135-145   Potassium                 3.7 mEq/L                   3.5-5.1   Chloride                  100 mEq/L                   96-112   Carbon Dioxide            28 mEq/L                    19-32   Glucose                   70 mg/dL                    16-10   BUN                       18 mg/dL                    9-60   Creatinine                0.8 mg/dL                   4.5-4.0   Calcium                   8.9 mg/dL                   9.8-11.9   GFR                       99.81 mL/min                >60.00  Hepatic/Liver Function Panel (HEPATIC)   Total Bilirubin           1.1 mg/dL                   1.4-7.8   Direct Bilirubin          0.3 mg/dL                   2.9-5.6   Alkaline Phosphatase      55 U/L                      39-117   AST                       32 U/L                      0-37   ALT                       29 U/L                      0-53   Total Protein  6.5 g/dL                    5.4-0.9   Albumin                   3.9 g/dL                    8.1-1.9  CBC Platelet w/Diff (CBCD)   White Cell Count          6.8 K/uL                    4.5-10.5    Red Cell Count            4.67 Mil/uL                 4.22-5.81   Hemoglobin                14.5 g/dL                   14.7-82.9   Hematocrit                42.6 %                      39.0-52.0   MCV                       91.1 fl                     78.0-100.0   Platelet Count            171.0 K/uL                  150.0-400.0   Neutrophil %         [H]  79.8 %                      43.0-77.0   Lymphocyte %         [L]  9.1 %                       12.0-46.0   Monocyte %                8.3 %                       3.0-12.0   Eosinophils%              2.3 %                       0.0-5.0   Basophils %               0.5 %                       0.0-3.0  Comments:      Lipid Panel (LIPID)   Cholesterol               187 mg/dL                   5-621   Triglycerides             35.0 mg/dL                  3.0-865.7   HDL  55.70 mg/dL                 >62.95   LDL Cholesterol      [H]  284 mg/dL                   1-32  TSH (TSH)   FastTSH                   2.99 uIU/mL                 0.35-5.50   Impression & Recommendations:  Problem # 1:  COPD (ICD-496) Stable w/o intercurrent exac>  continue inhalers. His updated medication list for this problem includes:    Advair Diskus 500-50 Mcg/dose Misc (Fluticasone-salmeterol) .Marland Kitchen... Take one puff twice daily    Proair Hfa 108 (90 Base) Mcg/act Aers (Albuterol sulfate) ..... Inhale 2 puffs every four hours as needed  Problem # 2:  ESSENTIAL HYPERTENSION (ICD-401.9) Stable>  continue meds. His updated medication list for this problem includes:    Atenolol 25 Mg Tabs (Atenolol) .Marland Kitchen... Take 1 tablet by mouth two times a day    Diovan Hct 160-12.5 Mg Tabs (Valsartan-hydrochlorothiazide) .Marland Kitchen... 1/2 by  mouth once daily  Orders: TLB-BMP (Basic Metabolic Panel-BMET) (80048-METABOL) TLB-Hepatic/Liver Function Pnl (80076-HEPATIC) TLB-CBC Platelet - w/Differential (85025-CBCD) TLB-Lipid Panel (80061-LIPID) TLB-TSH (Thyroid Stimulating  Hormone) (84443-TSH)  Problem # 3:  CORONARY ARTERY DISEASE (ICD-414.00) Stable w/o angina... continue same Rx. His updated medication list for this problem includes:    Bayer Low Strength 81 Mg Tbec (Aspirin) .Marland Kitchen... Take one pill by mouth once daily    Atenolol 25 Mg Tabs (Atenolol) .Marland Kitchen... Take 1 tablet by mouth two times a day    Diovan Hct 160-12.5 Mg Tabs (Valsartan-hydrochlorothiazide) .Marland Kitchen... 1/2 by  mouth once daily  Problem # 4:  VALVULAR HEART DISEASE (ICD-424.90) Followed by DrKatz>  stable. His updated medication list for this problem includes:    Bayer Low Strength 81 Mg Tbec (Aspirin) .Marland Kitchen... Take one pill by mouth once daily    Atenolol 25 Mg Tabs (Atenolol) .Marland Kitchen... Take 1 tablet by mouth two times a day  Problem # 5:  HYPERCHOLESTEROLEMIA (ICD-272.0) FLP looks satis on Fish Oil + diet...  Problem # 6:  DIVERTICULOSIS OF COLON (ICD-562.10) GI is stable & up to date...  Problem # 7:  HYPERTROPHY PROSTATE W/UR OBST & OTH LUTS (ICD-600.01) GU & PSAs followed by DrPeterson for Urology... His updated medication list for this problem includes:    Avodart 0.5 Mg Caps (Dutasteride) .Marland Kitchen... Take one pill by mouth once daily    Flomax 0.4 Mg Cp24 (Tamsulosin hcl) .Marland Kitchen... Take two pills by mouth once daily  Problem # 8:  DEGENERATIVE JOINT DISEASE (ICD-715.90) He prefers the Etod over the Mobic Rx... His updated medication list for this problem includes:    Bayer Low Strength 81 Mg Tbec (Aspirin) .Marland Kitchen... Take one pill by mouth once daily    Etodolac 400 Mg Tabs (Etodolac) .Marland Kitchen... Take 1 tab by mouth two times a day as needed for arthritis pain...  Problem # 9:  OTHER PROBLEMS AS NOTED>>>  Complete Medication List: 1)  Magic Mouthwash  .... 1 tsp gargle and swallow as needed... 2)  Fluticasone Propionate 50 Mcg/act Susp (Fluticasone propionate) .Marland Kitchen.. 1-2 sp in each nostril at bedtime.Marland KitchenMarland Kitchen 3)  Tussionex Pennkinetic Er 8-10 Mg/51ml Lqcr (Chlorpheniramine-hydrocodone) .Marland Kitchen.. 1 tsp two times a day as  needed cough 4)  Advair Diskus 500-50 Mcg/dose Misc (Fluticasone-salmeterol) .Marland KitchenMarland KitchenMarland Kitchen  Take one puff twice daily 5)  Proair Hfa 108 (90 Base) Mcg/act Aers (Albuterol sulfate) .... Inhale 2 puffs every four hours as needed 6)  Mucinex Dm 30-600 Mg Xr12h-tab (Dextromethorphan-guaifenesin) .... Take 1-2 tablets every 12 hours as needed 7)  Bayer Low Strength 81 Mg Tbec (Aspirin) .... Take one pill by mouth once daily 8)  Atenolol 25 Mg Tabs (Atenolol) .... Take 1 tablet by mouth two times a day 9)  Diovan Hct 160-12.5 Mg Tabs (Valsartan-hydrochlorothiazide) .... 1/2 by  mouth once daily 10)  Fish Oil 1000 Mg Caps (Omega-3 fatty acids) .... Take 1 capsule by mouth once a day 11)  Omeprazole 20 Mg Tbec (Omeprazole) .... Take one pill by mouth once daily 12)  Ranitidine Hcl 150 Mg Caps (Ranitidine hcl) .... Take 1 tablet by mouth once a day 13)  Miralax Powd (Polyethylene glycol 3350) .... Once daily 14)  Avodart 0.5 Mg Caps (Dutasteride) .... Take one pill by mouth once daily 15)  Flomax 0.4 Mg Cp24 (Tamsulosin hcl) .... Take two pills by mouth once daily 16)  Sanctura 20 Mg Tabs (Trospium chloride) .... Take one pill by mouth twice daily 17)  Etodolac 400 Mg Tabs (Etodolac) .... Take 1 tab by mouth two times a day as needed for arthritis pain... 18)  Ambien 10 Mg Tabs (Zolpidem tartrate) .... Take 1 tab by mouth at bedtime.... 19)  Cranberry Concentrate 500 Mg Caps (Cranberry) .... Take 1 capsule by mouth once a day 20)  Zithromax Z-pak 250 Mg Tabs (Azithromycin) .... Take as directed for infection...  Patient Instructions: 1)  Today we updated your med list- see below.... 2)  Continue your current meds the same; but we changed the Mobic (Meloxicam) to ETODOLAC for arthritis pain.Marland Kitchen 3)  We also wrote for a ZPak to use as needed for infection this winter... 4)  Today we did your follow up FASTING blood work... please call the "phone tree" in a few days for your lab results.Marland KitchenMarland Kitchen 5)  Call for any  problems.Marland KitchenMarland Kitchen 6)  Please schedule a follow-up appointment in 6 months. Prescriptions: ZITHROMAX Z-PAK 250 MG TABS (AZITHROMYCIN) take as directed for infection...  #1 pack x 2   Entered and Authorized by:   Michele Mcalpine MD   Signed by:   Michele Mcalpine MD on 07/31/2010   Method used:   Print then Give to Patient   RxID:   [id number] ETODOLAC 400 MG TABS (ETODOLAC) take 1 tab by mouth two times a day as needed for arthritis pain...  #60 x 6   Entered and Authorized by:   Michele Mcalpine MD   Signed by:   Michele Mcalpine MD on 07/31/2010   Method used:   Print then Give to Patient   RxID:   [id number]

## 2010-10-07 ENCOUNTER — Other Ambulatory Visit: Payer: Self-pay | Admitting: Dermatology

## 2010-10-14 ENCOUNTER — Telehealth (INDEPENDENT_AMBULATORY_CARE_PROVIDER_SITE_OTHER): Payer: Self-pay | Admitting: *Deleted

## 2010-10-16 ENCOUNTER — Ambulatory Visit: Payer: Self-pay | Admitting: Adult Health

## 2010-10-21 NOTE — Progress Notes (Signed)
Summary: FATIGUE/ LOW BP  Phone Note Call from Patient Call back at [facility] Phone [phone]   Caller: Patient Call For: NADEL Summary of Call: PT C/O LOW BP- TAKEN 15 MINS AGO IT WAS 107 / 70.Marland Kitchen HEART RATE 80. PT FEELS VERY FATIGUED.  Initial call taken by: Tivis Ringer, CNA,  October 14, 2010 3:34 PM  Follow-up for Phone Call        Spoke with pt.  He is c/o fatigue for the past few days, checked his BP and it was 107/70.  I advised that although he feels fatigued, this is a normal BP reading.  He states that his is normally higher and is concerned.  I advised needs ov for eval then.  Appt with TP sched for 10/16/10 at 4 pm.  Follow-up by: Vernie Murders,  October 14, 2010 4:34 PM

## 2010-10-30 ENCOUNTER — Other Ambulatory Visit: Payer: Self-pay | Admitting: Pulmonary Disease

## 2010-11-06 ENCOUNTER — Telehealth: Payer: Self-pay | Admitting: Pulmonary Disease

## 2010-11-06 MED ORDER — ZOLPIDEM TARTRATE 10 MG PO TABS: 10.0000 mg | ORAL_TABLET | Freq: Every evening | ORAL | Status: DC | PRN

## 2010-11-06 NOTE — Telephone Encounter (Signed)
Pt requesting refill for ambien 10 mg.  Pt last seen 12/11 and was advised f/u in 6 months. No pending appt's.  Please advise if okay to refill.  Thanks!

## 2010-11-06 NOTE — Telephone Encounter (Signed)
Called and spoke with pts spouse and she is aware of Remus Loffler called to the pharmacy

## 2010-11-09 LAB — COMPREHENSIVE METABOLIC PANEL
ALT: 18 U/L (ref 0–53)
AST: 24 U/L (ref 0–37)
Albumin: 3.4 g/dL — ABNORMAL LOW (ref 3.5–5.2)
Alkaline Phosphatase: 45 U/L (ref 39–117)
BUN: 11 mg/dL (ref 6–23)
Creatinine, Ser: 0.82 mg/dL (ref 0.4–1.5)
GFR calc non Af Amer: >60 mL/min (ref >60)
Glucose, Bld: 113 mg/dL — ABNORMAL HIGH (ref 70–99)
Total Bilirubin: 1.8 mg/dL — ABNORMAL HIGH (ref 0.3–1.2)

## 2010-11-09 LAB — CBC
HCT: 40.1 % (ref 39.0–52.0)
Hemoglobin: 13.6 g/dL (ref 13.0–17.0)
MCHC: 33.9 g/dL (ref 30.0–36.0)
MCV: 90.4 fL (ref 78.0–100.0)
Platelets: 135 10*3/uL — ABNORMAL LOW (ref 150–400)
RBC: 4.44 MIL/uL (ref 4.22–5.81)
RDW: 13.1 % (ref 11.5–15.5)
WBC: 9.6 10*3/uL (ref 4.0–10.5)

## 2010-11-09 LAB — URINALYSIS, MICROSCOPIC ONLY
Bilirubin Urine: NEGATIVE
Glucose, UA: NEGATIVE mg/dL
Hgb urine dipstick: NEGATIVE
Ketones, ur: NEGATIVE mg/dL
Specific Gravity, Urine: 1.006 (ref 1.005–1.030)
pH: 6 (ref 5.0–8.0)

## 2010-11-09 LAB — DIFFERENTIAL
Basophils Absolute: 0 10*3/uL (ref 0.0–0.1)
Eosinophils Absolute: 0.1 10*3/uL (ref 0.0–0.7)
Eosinophils Relative: 1 % (ref 0–5)
Lymphocytes Relative: 5 % — ABNORMAL LOW (ref 12–46)
Monocytes Absolute: 1 10*3/uL (ref 0.1–1.0)

## 2010-11-15 ENCOUNTER — Other Ambulatory Visit: Payer: Self-pay | Admitting: Pulmonary Disease

## 2010-11-20 ENCOUNTER — Other Ambulatory Visit: Payer: Self-pay | Admitting: Adult Health

## 2010-11-21 NOTE — Telephone Encounter (Signed)
Pt last seen in office by SN 11.29.11, told to follow up in 6 months.  No pending appts.

## 2010-11-26 ENCOUNTER — Other Ambulatory Visit: Payer: Self-pay | Admitting: Pulmonary Disease

## 2010-12-16 NOTE — Assessment & Plan Note (Signed)
[facility]                            CARDIOLOGY OFFICE NOTE   Christian [name], Christian [name]                       MRN:          [id number]  DATE:01/18/2008                            DOB:          September 23, 1931    The patient underwent cardiac catheterization by Dr. Diona Browner today, who  called me.  Coronaries look quite good.  Etiology of dropping his blood  pressure with exercise is not clear.  He does have an increased resting  heart rate.  Dr. Diona Browner and I agreed to push his beta-blocker dose up.  A right heart was done.  His wedge is mildly elevated.  He had had an  echo before the cath, and I understand that there is some mitral and  aortic valvular disease, but nothing severe enough to be causing  significant problems.  He also has small pleural effusion, and we will  have to look into further assessment of this.  The patient reminded Dr.  Diona Browner of a procedure in the past concerning tumor in his chest, and  we will think through this.   When I see him back, we will look further into the possibility of  evaluating shortness of breath and question of chest findings and  question of increased heart rate going forward.     Christian Abed, MD, [facility]  Electronically Signed    JDK/MedQ  DD: 01/18/2008  DT: 01/18/2008  Job #: [id number]

## 2010-12-16 NOTE — Assessment & Plan Note (Signed)
[name]                            CARDIOLOGY OFFICE NOTE   [name], [name]                       MRN:          [id number]  DATE:04/17/2008                            DOB:          1931-08-12    Mr. Christian [name] is here for Cardiology followup.  I had seen him last on January 30, 2008.  After that, he had a complete pulmonary followup by Dr.  Kriste Basque.  This was done on March 13, 2008.  Dr. Kriste Basque felt that the  patient has combined restrictive and obstructive disease.  He has a  chronically elevated left hemidiaphragm and scarring.  His PFTs have  been stable.  Dr. Kriste Basque felt that this is what we can hope for that is  stability without much deterioration.  He was encouraged to continue his  exercise program.  His medications were adjusted further, and he is now  on max medications per the patient.  He has not had any major difficulty  since that time.  He has had a problem in one of his legs and he is  having a Doppler for this today scheduled by Dr. Kriste Basque.  There has been  no significant chest pain.   PAST MEDICAL HISTORY:   ALLERGIES:  IV DYE, IVP DYE.   MEDICATIONS:  See all of the EMR and flow sheet.   OTHER MEDICAL PROBLEMS:  See my complete list of January 30, 2008, and the  EMR sheet from March 13, 2008.   REVIEW OF SYSTEMS:  He is feeling relatively well today other than the  area in his leg that needs further evaluation.  I suspect this area is  improving.  He may have had some type bruise in this area.  Otherwise,  review of systems is negative.   PHYSICAL EXAMINATION:  VITAL SIGNS:  Weight is stable at 193 pounds.  Blood pressure is 119/84 with a pulse of 100.  GENERAL:  The patient is oriented to person, time, and place.  Affect is  normal.  HEENT:  Reveals no xanthelasma.  He has normal extraocular motion.  NECK:  There are no carotid bruits.  There is no jugular venous  distention.  LUNGS:  Clear.  Respiratory effort is not labored.  CARDIAC:  Reveals S1-S2.  He does have a systolic murmur.  Upon  examination of his heart, the patient appeared to have a trigeminal  rhythm.  He was stable with this.  ABDOMEN:  Soft.  EXTREMITIES:  He has no significant peripheral edema.   Problems are listed on my note of January 30, 2008.  #2.  Coronary disease.  He underwent cardiac cath and he has mild  coronary disease.  This is to be treated with the current medications.  #10.  Mild aortic stenosis by echocardiogram follow.  #11.  Mild mitral stenosis by echocardiogram follow.  #12.  Episode of decreasing blood pressure with a Myoview scan.  Cath  showed no marked ischemia.   Overall, cardiac status is stable.  I will see him back in 1 year for  followup.  Luis Abed, MD, [facility]  Electronically Signed    JDK/MedQ  DD: 04/17/2008  DT: 04/17/2008  Job #: [id number]

## 2010-12-16 NOTE — Assessment & Plan Note (Signed)
[facility]                            CARDIOLOGY OFFICE NOTE   JANTHONY, HOLLEMAN                       MRN:          [id number]  DATE:01/11/2007                            DOB:          October 26, 1931    Mr. Amsden is doing well.  He remains active.  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  He had a flutter ablation  in the past and he has done well.  Echo in 2006 showed good LV function.  He has aortic valve sclerosis but no stenosis.  He has slight peripheral  edema that has been mentioned before.  This is intermittent and does not  appear to be a significant problem.  He has lung disease, it is followed  carefully be Dr. Kriste Basque, and he will be seeing Dr. Kriste Basque for followup in  the near future.  Historically, his heart rate runs in the 90s.  Today  it was 100 at the time that I am seeing him.  He is on Toprol 50.  I  have decided not to change the dose.  With his lung disease, I am  hesitant to push his beta blocker any higher.  I have not checked his  thyroid.  I will leave this up to Dr. Kriste Basque.  The patient feels well.  Consideration could be given to higher dose beta blocker, but I have  decided not to do that at this point.   PAST MEDICAL HISTORY:   ALLERGIES:  IVP DYE.   MEDICATIONS:  1. Toprol XL 50.  2. Aspirin 81.  3. Ranitidine 150.  4. Advair.  5. Omeprazole.  6. Mobic.  7. Mucinex DM.  8. Avodart.  9. Flomax.  10.Sanctura.  11.Ambien p.r.n.  12.Albuterol p.r.n.  13.Tussionex p.r.n.   OTHER MEDICAL PROBLEMS:  See the list below.   REVIEW OF SYSTEMS:  He is doing well and review of systems is negative  other than the HPI.   PHYSICAL EXAMINATION:  Weight is 192 pounds which is down 3 pounds since  February of 2008.  Blood pressure is 116/66 with a pulse of 98.  The patient is oriented to person, time, and place.  Affect is normal.  He has no xanthelasma.  There is normal extraocular motion.  Conjunctivae are normal.  There are no carotid bruits.  There is no jugular venous distension.  Lungs reveal distant breath sounds, but no significant abnormalities  otherwise noted.  CARDIAC EXAM:  Reveals and S1 with an S2.  There is a 2/6 systolic  murmur.  ABDOMEN:  Reveals normal bowel sounds.  He has trace peripheral edema.   EKG reveals right bundle branch block.  He has had an incomplete right  bundle branch block in the past.  This does not need any further  evaluation.   PROBLEMS:  1. History of atrial flutter ablation in the past.  2. Minimal coronary disease in the past.  He needs careful followup of      his lipid status, and I will leave this up to Dr.  Nadel.  3. History of good left ventricular function.  4. 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 Kriste Basque.  5. History of tobacco in the past.  6. Significant chronic obstructive pulmonary disease.  7. Trace edema.  This appears not to be a significant problem.  8. Mild resting increased heart rate.  See the discussion above.   I will see him for followup in 1 year.     Luis Abed, MD, [facility]  Electronically Signed    JDK/MedQ  DD: 01/11/2007  DT: 01/11/2007  Job #: [id number]   cc:   Lonzo Cloud. Kriste Basque, MD

## 2010-12-16 NOTE — Assessment & Plan Note (Signed)
[facility]                            CARDIOLOGY OFFICE NOTE   REGIS, HINTON                       MRN:          [id number]  DATE:01/03/2008                            DOB:          Oct 01, 1931    I saw Christian [name] on January 03, 2008.  He underwent a stress Myoview scan on  January 09, 2008.  During that study, he had progressive decrease in blood  pressure.  He did not have chest pain.  There were no significant ST  changes, but his overall exercise capacity was relatively poor.  There  was question of very mild lateral ischemia.   I called Christian [name] today.  I offered to see him in the office this coming  Thursday and he is busy.  I made it clear to him that I wanted to be  very careful about this finding of the decreased blood pressure with  stress and that we may want to proceed with catheterization.  I will get  back in touch with him, and we will arrange for followup visit next  week.     Luis Abed, MD, [facility]  Electronically Signed    JDK/MedQ  DD: 01/10/2008  DT: 01/10/2008  Job #: [id number]

## 2010-12-16 NOTE — Assessment & Plan Note (Signed)
[facility]                            CARDIOLOGY OFFICE NOTE   EINO, WHITNER                       MRN:          [id number]  DATE:01/30/2008                            DOB:          September 04, 1931    Mr. Trefz is seen for followup.  I had seen him last on January 12, 2008.  I  made a decision to proceed with 2-D echo and a heart cath.  The 2-D  revealed that the patient has ejection fraction 55%-60%.  There was very  mild aortic stenosis.  There was also question of mild mitral stenosis.  There was mild pulmonary hypertension.  The mitral stenosis may have  been related to mitral annular calcification.  LV function was good.  The patient then underwent cardiac catheterization.  I spoke with Dr.  Diona Browner on that day.  He felt the patient had mild coronary disease  with no flow-limiting lesions.  Ejection fraction was good.  Pulmonary  artery systolic pressure was 37 mmHg.  The patient's resting heart rate  was mildly increased.  We increased his beta blocker.  He is now back  for followup.  The patient has not had any recurrent spells.  He is  going about full activity.   ALLERGIES:  IVP DYE.   MEDICATIONS:  Advair, aspirin, ranitidine, omeprazole, meloxicam,  Mucinex, Avodart, Flomax, Sanctura, and atenolol.   OTHER MEDICAL PROBLEMS:  See the list below.   REVIEW OF SYSTEMS:  He is actually feeling well and doing well.   Review of systems is negative.   PHYSICAL EXAM:  Blood pressure is 124/83 with a pulse of 90.  Weight is  192 pounds.  The patient is oriented to person, time and place.  Affect  is normal.  HEENT:  Reveals no xanthelasma.  He has normal extraocular motion.  There are no carotid bruits.  There is no jugular venous distention.  LUNGS:  Clear.  Respiratory effort is not labored.  CARDIAC:  Exam reveals an S1 with an S2.  There is a 2-3/6 systolic  crescendo decrescendo murmur.  ABDOMEN:  Soft.  He has no significant peripheral  edema.   No labs were done today.  See the echo report and the cath report.   PROBLEMS:  Include:  1. History of allergic rhinitis.  2. History of mild coronary disease and his coronary disease remains      quite mild.  It would be optimal to have him on a statin.  He is      hesitant at this point.  I am not pushing for it yet until we have      a better understanding of his other problems.  3. History of atrial flutter that was ablated in the past.  We have      not re-documented any supraventricular arrhythmias.  4. History of gastroesophageal reflux disease.  5. History of prostate abnormalities.  6. Good left ventricular function.  7. History of mediastinal melanoma that was removed with a thoracotomy      through the mediastinum in the past.  8. History  of tobacco use.  9. History of chronic obstructive pulmonary disease.  10.Mild aortic stenosis by echo.  11.Mild mitral stenosis by echo that may be related to mitral annular      calcification.  12.Episode of decreasing his blood pressure while exercising with a      Myoview scan.  The cath showed no ischemia.   The patient's cardiac status appears to be stable.  I am hesitant to  push his beta-blocker any higher.  Certainly we need change and a  selective beta blocker if this is done.  I have asked the patient to  return to see Dr. Kriste Basque for one followup visit to specifically re-  address the issue as to whether or not any further pulmonary testing is  needed at this time to see if the patient becomes more hypoxic with  stress as expected, or if there are other issues that could be causing  his blood pressure to drop with exercise that could be related to his  pulmonary status.  I will see him back in cardiology followup in 3  months.    There is a chest X-Ray from 01/12/08 that questions a pleural effusion.  I will be sure that Dr. Kriste Basque is aware of this study.     Luis Abed, MD, [facility]  Electronically Signed     JDK/MedQ  DD: 01/30/2008  DT: 01/30/2008  Job #: [id number]   cc:   Lonzo Cloud. Kriste Basque, MD

## 2010-12-16 NOTE — Cardiovascular Report (Signed)
NAMESAVIR, BLANKE NO.:  [id number]   MEDICAL RECORD NO.:  [id number]          PATIENT TYPE:  OIB   LOCATION:  1961                         FACILITY:  [facility]   PHYSICIAN:  Jonelle Sidle, MD DATE OF BIRTH:  1931-09-26   DATE OF PROCEDURE:  DATE OF DISCHARGE:  01/18/2008                            CARDIAC CATHETERIZATION   REQUESTING CARDIOLOGIST:  Luis Abed, MD, [facility]   INDICATIONS:  Mr. Christian [name] is a 77 year old gentleman with a history of  previously documented mild coronary atherosclerosis in 2003, atrial  flutter status post radiofrequency ablation, previous diagnosis of  mediastinal melanoma status post thoracotomy back in the 1980s, chronic  obstructive pulmonary disease, and recently documented mild mitral  stenosis with moderate mitral annular calcification and very mild aortic  stenosis by echocardiography.  Mr. Senger has experienced dyspnea on  exertion and was referred for an exercise Myoview.  This study indicated  exercise-associated hypotension, although without diagnostic  electrocardiographic changes.  Perfusion imaging suggested slight  lateral ischemia.  Dr. Myrtis Ser reviewed this with the patient and scheduled  him for a diagnostic cardiac catheterization for further assessment.  Preprocedure labs were reviewed as well as chest x-ray report indicating  a small loculated pleural effusion on the left with left lower lobe  atelectasis.  The patient was also noted to have a contrast dye allergy  and was appropriately premedicated.  The potential risks and benefits  were explained to him in advance and informed consent was obtained.   PROCEDURES PERFORMED:  1. Left heart catheterization.  2. Right heart catheterization.  3. Selective coronary angiography.  4. Left ventriculography.   ACCESS AND EQUIPMENT:  The area about the right femoral artery and vein  was anesthetized with 1% lidocaine.  A 5-French sheath was placed in the  right femoral  artery via the modified Seldinger technique as well as a 7-  French sheath in the right femoral vein via the modified Seldinger  technique.  Preformed 5-French AL I and 3DRC catheters were used for  selective coronary angiography and an angled pigtail catheter was used  for left heart catheterization, and left ventriculography.  A balloon-  tipped flow-directed catheter was used for right heart catheterization  and hemodynamic assessment.  All exchanges were made over the wire.  Of  note, the left main had a fairly anterior/superior takeoff from the left  coronary cusp requiring use of the AL I catheter.  A total of 125 mL  Omnipaque was used.  There were noted to be no immediate complications.   HEMODYNAMICS:  Right atrium mean of 12, right ventricle 41/7, pulmonary  artery 37/20 with a mean of 29, pulmonary capillary wedge pressure mean  of 19, cardiac output 4.6, cardiac index 2.2, left ventricle 126/12  mmHg, aorta 124/77 mmHg, arterial saturation 96% on room air, and  pulmonary artery saturation 71% on room air.   ANGIOGRAPHIC FINDINGS:  1. Left main coronary artery gives rise to left anterior descending      and circumflex vessels and has a superior takeoff from the left      coronary  cusp.  No significant flow-limiting coronary      atherosclerosis is noted.  2. Left anterior descending is medium in caliber with 2 diagonal      branches and a large septal perforator.  There are luminal      irregularities noted throughout the vessel including 20% at the      ostium and also more focal 30%-40% stenosis within the midvessel      segment.  No clear flow-limiting stenosis are noted, however.  3. The circumflex coronary artery is relatively large and codominant      with a smaller right coronary artery.  There are minor luminal      irregularities of 20% scattered throughout the circumflex.  4. The right coronary artery is relatively small and codominant.      There is 40%-50%  stenosis involving the ostium of the right      ventricular marginal branch as well as a 20% stenosis within the      right coronary artery proper.   Left ventriculography was performed in the RAO projection.  There is  evidence of moderate mitral annular calcification without significant  mitral regurgitation.  Ejection fraction is approximately 65% without  focal wall motion abnormality.   DIAGNOSES:  1. Mild coronary atherosclerosis as outlined above without clearly      flow-limiting stenoses.  2. Left ventricular ejection fraction approximately 65% with moderate      mitral annular calcification and no significant mitral      regurgitation.  The trans aortic valve gradient was not      significant.  3. Pulmonary artery systolic pressure of 37 mmHg with a mean pulmonary      capillary wedge pressure of 19 mmHg, cardiac index 2.2, and      approximately a 5-mm gradient in comparing the left ventricular      pressures to the pulmonary capillary wedge pressure.  This would be      suggestive of mild mitral stenosis as already indicated      echocardiographically.  No clear equalization of diastolic      pressures.   DISCUSSION:  I reviewed the results with the patient, his family, and  with Dr. Myrtis Ser by phone.  We will plan to increase atenolol from 25 mg to  50 mg daily and have the patient follow up in the office with Dr. Myrtis Ser  for further assessment.      Jonelle Sidle, MD  Electronically Signed     SGM/MEDQ  D:  01/18/2008  T:  01/19/2008  Job:  [id number]   cc:   Luis Abed, MD, [facility]

## 2010-12-16 NOTE — Assessment & Plan Note (Signed)
[facility]                            CARDIOLOGY OFFICE NOTE   AMORE, GRATER                       MRN:          [id number]  DATE:01/03/2008                            DOB:          [birth year]    Mr. Gossard is doing very well.  He historically had only minimal coronary  disease.  He had an atrial flutter ablation in the past.  He has a  history of good LV function.  He has a mediastinal scar; however, it is  not from coronary disease, it is from a melanoma that was removed via  thoracotomy from this position.  He has a history of tobacco use in the  past.  There is COPD.  There is a history of right bundle-branch block.  Overall, he has done well.  He has not had any chest pain.  He does have  some exertional shortness of breath related to his COPD.  He remains  active.   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  He did not have true  syncope.  There was no definite chest pain.  Also, he says when he  coughs he has a discomfort that goes down his left arm.  This does not  sound like angina.  It may will be a nerve root compression.  It is a  chronic problem.   ALLERGIES:  IVP DYE.   MEDICATIONS:  Advair, aspirin, ranitidine, omeprazole, meloxicam,  Mucinex, Avodart, Flomax, Sanctura, and atenolol 50 mg.   OTHER MEDICAL PROBLEMS:  See the list below.   REVIEW OF SYSTEMS:  He has some trace edema from time to time.  Otherwise, his review of systems is negative.   PHYSICAL EXAMINATION:  Weight is 195 pounds.  Blood pressure is 118/72  with pulse of 84.  The patient is oriented to person, time, and place.  Affect is normal.  HEENT:  No xanthelasma.  He has normal extraocular motion.  NECK:  There are no carotid bruits.  There is no jugular venous  distention.  LUNGS:  Clear.  Respiratory effort is not  labored.  CARDIAC:  S1 with an S2.  There is a 2/6 systolic murmur.  ABDOMEN:  Soft.  He has no significant peripheral edema.   EKG reveals old right bundle-branch block.   PROBLEMS:  1. History of allergic rhinitis.  2. Mild coronary disease.  He is not on a statin at this point.  I      will talk with him more about that at the time of the next visit as      I believe we do need for him to be on a statin.  3. History of atrial flutter, treated with ablation in the past.  4. History of gastroesophageal reflux disease.  5. History of prostate abnormalities.  6. History of good left ventricular function.  7. History of mediastinal melanoma, removed with a thoracotomy through  the mediastinum.  8. History of tobacco in the past.  9. Significant chronic obstructive pulmonary disease.  10.Soft systolic murmur with aortic valve sclerosis by history.  His      last echo was done in 2006.   With known coronary artery disease and recent presyncopal episode, we  need to assess further.  We will proceed with a stress Myoview scan and  then I will see him back, and we will discuss the other issues.      Luis Abed, MD, [facility]  Electronically Signed    JDK/MedQ  DD: 01/03/2008  DT: 01/04/2008  Job #: [id number]   cc:   Lonzo Cloud. Kriste Basque, MD

## 2010-12-16 NOTE — Assessment & Plan Note (Signed)
[facility]                            CARDIOLOGY OFFICE NOTE   MOOSA, BUECHE                       MRN:          [id number]  DATE:01/12/2008                            DOB:          19-Aug-1931    Christian [name] is here for follow-up.  I saw him on January 03, 2008.  At that  time I was quite concerned about the spell that he had had of feeling  markedly short of breath after walking.  We proceeded with a stress  Myoview scan.  The patient's exercise tolerance was actually quite  limited.  He walked only 4 minutes and 17 seconds.  He had a decrease in  blood pressure.  He did have a fairly rapid increase in heart rate.  His  nuclear images raised only the slightest suggestion of some mild lateral  ischemia.  However, he had a definite hypotensive response to stress.  I  was aware of that, talked with him and I am seeing him now back in the  office.  His ejection fraction was 64%.  I have carefully reviewed the  nuclear images.  As mentioned, there is question of slight ischemia.  I  do not see any marked dilatation of the ventricle with stress.  The TID  is 0.97 which is normal.   Today, I have reviewed the issue with Christian [name] in person and told him  that I want to be sure that this does not represent significant ischemia  and the possibility that he has equal ischemia in various distributions  that could give [place] a false negative on the study.   PAST MEDICAL HISTORY:   ALLERGIES:  There is question of IVP DYE.  We will review his last cath  done in 2003 to see if he required prophylaxis at that time.   MEDICATIONS:  1. Advair 550 b.i.d.  2. Aspirin 81.  3. Ranitidine 150.  4. Omeprazole 20.  5. Meloxicam.  6. Mucinex.  7. Avodart.  8. Flomax.  9. Sanctura.  10.Atenolol 25 daily.   OTHER MEDICAL PROBLEMS:  See the list below.   REVIEW OF SYSTEMS:  Today he feels fine.  He has a good understanding of  the issue as I have described it.  He is  not having any chest pain or  shortness of breath today.   PHYSICAL EXAMINATION:  VITAL SIGNS:  Weight is 192 pounds.  Blood  pressure is 124/66 and his resting pulse today is 95.  GENERAL APPEARANCE:  The patient is oriented to person, time and place.  Affect is normal.  HEENT:  No xanthelasma.  He has normal extraocular motion.  NECK:  There are no carotid bruits.  There is no jugular venous  distention.  LUNGS:  Clear.  Respiratory effort is not labored.  CARDIAC:  Exam reveals an S1 with an S2.  There is a soft systolic  murmur.  ABDOMEN:  Soft.  EXTREMITIES:  He has no peripheral edema.   See the discussion above concerning the stress Myoview scan.   PROBLEMS:  1. History of allergic rhinitis.  2. History of mild coronary disease in 2003.  3. History of atrial flutter that was treated with ablation in the      past.  It is of note that while exercising, he had a rapid increase      in heart rate and some of the rhythm is irregular.  I cannot be      sure yet if he is having supraventricular tachycardia as part of      his symptomatology.  4. History of gastroesophageal reflux disease.  5. Prostate abnormalities.  6. History of good left ventricular function.  7. History of a mediastinal melanoma removed with a thoracotomy      through the mediastinum in the past.  8. History of tobacco in the past.  9. Significant chronic obstructive pulmonary disease.  10.Soft systolic murmur by history.  His last echo was done in 2006.      He needs a follow-up echo at this time.  11.Significant decrease in blood pressure while exercising on his      Myoview scan with question of some slight lateral ischemia.   PLAN:  1. A 2-D echo to reassess LV function in his aortic valve.  2. Proceed with heart catheterization as an outpatient to be sure that      he is not having significant mixed diffuse ischemia.  I will then      see him back.  After that time, if his coronaries are not the       problem, we will look further into whether or not his heart rate      and supraventricular arrhythmias is a problem or whether or not his      COPD is so significant that he in fact decompensates on this basis.      He understands the plan.     Luis Abed, MD, [facility]  Electronically Signed    JDK/MedQ  DD: 01/12/2008  DT: 01/12/2008  Job #: [id number]   cc:   Lonzo Cloud. Kriste Basque, MD

## 2010-12-19 NOTE — Discharge Summary (Signed)
Christian [name], Christian [name]                          ACCOUNT NO.:  [id number]   MEDICAL RECORD NO.:  [id number]                   PATIENT TYPE:  OIB   LOCATION:  2025                                 FACILITY:  [facility]   PHYSICIAN:  Doylene Canning. Ladona Ridgel, M.D. [facility]           DATE OF BIRTH:  1932/05/31   DATE OF ADMISSION:  DATE OF DISCHARGE:  03/08/2002                                 DISCHARGE SUMMARY   PRIMARY DIAGNOSIS:  Atrial flutter.   SECONDARY DIAGNOSES:  COPD, atrial flutter, status post resection of a  mediastinal melanoma in the 1980s and prior tonsillectomy with prior skin  cancer removals.   HOSPITAL COURSE:  The patient was admitted and underwent an atrial flutter  ablation of typical atrial flutter.  He tolerated the procedure well  and  was transferred to 09/1998.  At that time the patient was noted to have some  normal sinus rhythm with a first degree block and occasional junctional  escape rhythms.  He was continued on telemetry.  The following day the  patient was noted to be a sinus tachycardia at a rate of 120 and he was  restarted on beta blockers.  The patient was also noted to have a productive  cough of blood tinged sputum and also a tannish tint with a temperature  spike of 101.  He then had cultures obtained.  Blood cultures times 1 day  showed no growth.  A urine and sputum culture were both obtained.  Sputum  culture showed moderate WBCs predominately PMN.  A few squamous epithelial  cells present, gram positive cocci in pairs, it was a preliminary report.  Culture status was too young to read.  Urine culture is still pending.  The  patient was started on Augmentin 500 mg b.i.d. for 7 days.  The patient also  had a 2D echo performed which showed overall EF to be 55-65 percent.  The  aortic valve was moderately calcified with mildly reduced aortic valve  leaflet excursion, mean transaortic valve gradient was 7.5 mm of hemoglobin,  mild to moderate mitral annular  calcification consistent with mild mitral  stenosis.  Left atrium was mildly dilated.  Right ventricle was mildly  dilated.  Estimated peak artery systolic pressures were moderately  increased.  Right atrium mildly dilated. Trace anterior pericardial  effusion.   The patient's INR upon discharge was 1.4.  He was discharged on the  following medications:  Albuterol, Flovent, and Advair as before.  Flomax  0.4 mg nightly, Zantac 150 daily, Toprol XL 50 daily, Coumadin 5 daily on  Monday, Wednesday, and Friday 7-1/2.  Augmentin 500 mg twice a day for 7  days.  Vitamins as before.  Coated aspirin 81 daily for 6 weeks.  Antibiotics prior to any dental.  Urine bowel procedures for the next 3  months.  He was not to do any strenuous activity for the next 4 days and  no  driving for 1 more day.  Low-fat, low-cholesterol, low-salt diet.  Is was to  call if he developed a lump or any drainage in his groin or right neck.  An  appointment was scheduled at the Coumadin at [facility] on Friday 08/08 at  12:30 p.m.  He was to schedule an appointment with Dr. Kriste Basque for next week  to be seen for bronchitis, and to follow with Dr. Lind Guest 10/01 at 10:15  a.m.     Diane Sauro, CRNP [facility]                     Doylene Canning. Ladona Ridgel, M.D. [facility]   DS/MEDQ  D:  03/08/2002  T:  03/13/2002  Job:  [id number]   cc:   Luis Abed, M.D. [facility]   Doylene Canning. Ladona Ridgel, M.D. [facility]   Scott M. Kriste Basque, M.D. [facility]

## 2010-12-19 NOTE — Assessment & Plan Note (Signed)
[facility]                             PULMONARY OFFICE NOTE   ZEBULAN, HINSHAW                       MRN:          [id number]  DATE:07/16/2006                            DOB:          1932-06-29    HISTORY OF PRESENT ILLNESS:  The patient is a very pleasant 77 year old  male patient of Dr. Kriste Basque.  He has a known history of asthmatic  bronchitis and COPD in an ex-smoker, who presents for an acute office  visit.  The patient complains of a 2 day history of nasal congestion,  post-nasal drip, and a cough with clear sputum.  He denies any  hemoptysis, orthopnea, PND, leg swelling, or recent antibiotic therapy.   PAST MEDICAL HISTORY:  Reviewed.   CURRENT MEDICATIONS:  Reviewed.   PHYSICAL EXAM:  The patient is a pleasant male in no acute distress.  He is afebrile.  O2 saturation is 96% on room air.  HEENT:  Nasal mucosa is pale with some turbinate edema.  Nontender  sinuses.  NECK:  Supple without adenopathy.  LUNGS:  Sounds reveal coarse breath sounds without any wheezing or  crackles.  CARDIAC:  Regular rhythm.  ABDOMEN:  Soft.  EXTREMITIES:  Warm without any edema.   IMPRESSION AND PLAN:  Acute upper respiratory infection suspected viral  in nature.  The patient is to add in Mucinex DM twice daily and may use  Omnicef x5 days if symptoms worsen over the next 5 to 7 days with  purulent sputum.  The patient will follow back up in the office with Dr.  Kriste Basque or sooner if needed.      Rubye Oaks, NP  Electronically Signed      Lonzo Cloud. Kriste Basque, MD  Electronically Signed   TP/MedQ  DD: 07/18/2006  DT: 07/18/2006  Job #: [id number]

## 2010-12-19 NOTE — Assessment & Plan Note (Signed)
[facility]                             PULMONARY OFFICE NOTE   Christian [name], Christian [name]                         MRN:          [id number]  DATE:09/20/2006                            DOB:          16-Mar-1932    HISTORY OF PRESENT ILLNESS:  The patient is a 77 year old white male  patient of Dr. Kriste [name], who has a known history of asthmatic bronchitis  and COPD and presents for an acute office visit.  The patient complains  of a one-week history of productive cough with thick, green-yellow  sputum.  The patient has noted that, over the last two days, he has had  some intermittent episodes of blood-tinged sputum and complains of a  harsh cough with wheezing.  The patient denies any chest pain, increased  shortness of breath, orthopnea, PND or leg-swelling.   PAST MEDICAL HISTORY:  Reviewed.   CURRENT MEDICATIONS:  Reviewed.   PHYSICAL EXAM:  The patient is a pleasant male, in no acute distress.  He is afebrile with stable vital signs.  O2 saturation is 96% on room  air.  HEENT:  Unremarkable.  NECK:  Supple without adenopathy.  No JVD.  LUNGS:  Lung sounds revealed coarse breath sounds bilaterally with some  scattered rhonchi.  CARDIAC:  Regular rate.  ABDOMEN:  Soft and nontender.  EXTREMITIES:  Warm without any edema.   IMPRESSION AND PLAN:  Acute asthmatic bronchitic exacerbation.  The  patient is to begin Levaquin 750 times seven days.  Adding Mucinex-DM  twice daily.  May use Tussionex as needed for cough.  Chest x-ray is  pending at time of dictation.  The patient will follow back up with Dr.  Kriste [name] in two weeks or sooner, if needed.      Rubye Oaks, NP  Electronically Signed      Christian Cloud. Kriste Basque, MD  Electronically Signed   TP/MedQ  DD: 09/22/2006  DT: 09/22/2006  Job #: [id number]

## 2010-12-19 NOTE — Cardiovascular Report (Signed)
[facility]  Patient:    Christian [name], Christian [name] Visit Number: [id number] MRN: [id number]          Service Type: CAT Location: [facility] 2876 [id number] Attending Physician:  Veneda Melter Dictated by:   Veneda Melter, M.D. Proc. Date: 02/08/02 Admit Date:  02/08/2002 Discharge Date: 02/08/2002   CC:         Lorin Picket M. Kriste Basque, M.D. [facility]  Luis Abed, M.D. [facility]   Cardiac Catheterization  PROCEDURES PERFORMED: 1. Left heart catheterization. 2. Left ventriculogram. 3. Selective coronary angiography. 4. Perclose, right femoral artery.  DIAGNOSES: 1. Mild coronary artery disease by angiogram. 2. Normal left ventricular systolic function. 3. Atrial flutter.  HISTORY: The patient is a 77 year old gentleman who presents with substernal chest discomfort and new onset atrial flutter. The patient had been treated medically for his flutter with plans for cardioversion, but he did not spontaneously convert. He is being anticoagulated with Coumadin. Due to recent episodes of chest discomfort he is referred for further assessment of his coronary arteries.  TECHNIQUE: After informed consent was obtained, the patient was brought to the cardiac catheterization lab where a 6 French sheath was placed in the right femoral artery. An initial attempt was made to use a JL4 catheter to engage the left coronary artery. However, due to acute superior takeoff, an AL-1 catheter was used to engage the left coronary artery. A JR4 catheter was used to engage the right coronary artery. Selective angiography was performed in various projections using manual injections of contrast. A 6 French pigtail catheter was then advanced to the left ventricle and a left ventriculogram performed using power injections of contrast. At the termination of the case, the catheters and sheath were removed and a Perclose suture closure device deployed to the right femoral artery until adequate hemostasis was  achieved. The patient tolerated the procedure well and was transferred to the floor in stable condition.  FINDINGS: Findings are as follows: 1. Left main trunk: The left main trunk is a large caliber vessel with no    significant stenoses. 2. LAD: This is a medium caliber vessel that provides two diagonal branches    in the mid section. The LAD has luminal irregularities not greater than    10-20%. 3. Left circumflex artery: This is a large caliber vessel that provides two    medium sized marginal branches in the mid section and a distal marginal    branch that help supply the inferior septum. The AV circumflex has mild    irregularities of 30% and the circumflex artery is codominant. 4. Right coronary artery is codominant. This is a small caliber vessel that    provides two RV marginal branches. The inferior RV marginal branch extends    to the inferoseptal wall and provides some septal perforators. The    right coronary artery has luminal irregularities.  LEFT VENTRICULOGRAM: Normal end-systolic and end-diastolic dimensions. Overall left ventricular function is well preserved, ejection fraction of greater than 55%.  No mitral regurgitation. LV pressure 110/5, aortic is 110/60, LVEDP equals 15.  ASSESSMENT AND PLAN: The patient is a 77 year old gentleman with new onset atrial flutter and chest discomfort. He has mild coronary artery disease by angiogram that can be medically managed. He will continue on coagulation rate control for his atrial flutter and cardioverted if necessary. Dictated by:   Veneda Melter, M.D. Attending Physician:  Veneda Melter DD:  02/08/02 TD:  02/11/02 Job: [id number]

## 2010-12-19 NOTE — Assessment & Plan Note (Signed)
[facility]                             PULMONARY OFFICE NOTE   Christian [name], Christian [name]                       MRN:          [id number]  DATE:09/09/2006                            DOB:          1932-03-18    HISTORY OF PRESENT ILLNESS:  The patient is a 77 year old white male  patient of Dr. Jodelle Green who has a known history of asthmatic bronchitis  and COPD, presents for a 1-week history of nasal congestion, productive  cough and wheezing.  The patient denies any hemoptysis, chest pain,  orthopnea, PND or leg swelling.   PAST MEDICAL HISTORY:  Reviewed.   CURRENT MEDICATIONS:  Reviewed.   PHYSICAL EXAMINATION:  The patient is a pulse male in no acute distress.  He is afebrile, stable vital signs.  O2 saturation is 96% on room air.  HEENT:  Nasal mucosa is slightly swollen.  Nontender sinuses, the  posterior pharynx is clear.  NECK:  Supple without adenopathy.  No JVD.  LUNGS:  Lung sounds reveal coarse breath sounds bilaterally with a few  expiratory wheezes.  CARDIAC:  Regular rate and rhythm.  ABDOMEN:  Soft and benign.  EXTREMITIES:  Warm without any calf tenderness, cyanosis, clubbing or  edema.   IMPRESSION AND PLAN:  Acute asthmatic bronchitic exacerbation.  The  patient was given doxycycline x7 days, Mucinex DM twice daily,  prednisone taper over the next week, Tussionex as needed for cough #4  ounces one teaspoon every 12 hours as needed, no refill.  The patient  was given a Xopenex nebulizer treatment in the office.  The patient will  return back with Dr. Kriste Basque as scheduled, or sooner if needed.      Rubye Oaks, NP  Electronically Signed      Lonzo Cloud. Kriste Basque, MD  Electronically Signed   TP/MedQ  DD: 09/09/2006  DT: 09/09/2006  Job #: [id number]

## 2010-12-19 NOTE — Op Note (Signed)
NAMEGARRUS, Christian [name]                          ACCOUNT NO.:  [id number]   MEDICAL RECORD NO.:  [id number]                   PATIENT TYPE:  OIB   LOCATION:  2025                                 FACILITY:  [facility]   PHYSICIAN:  Doylene Canning. Ladona Ridgel, M.D. [facility]           DATE OF BIRTH:  [birth year]   DATE OF PROCEDURE:  03/06/2002  DATE OF DISCHARGE:  03/08/2002                                 OPERATIVE REPORT   PROCEDURE PERFORMED:  Invasive electrophysiologic study with radiofrequency  catheter ablation of atrial flutter.   INTRODUCTION:  The patient is a very pleasant 77 year old man who presented  with tachy palpitations and shortness of breath and was subsequently found  to be in atrial flutter.  He subsequently underwent catheterization  demonstrating nonocclusive coronary artery disease.  His ejection fraction  was greater than 55%.  He was begun on Coumadin and after approximately  three weeks of therapeutic INRs, he is referred for catheter ablation of his  atrial flutter.   PROCEDURE:  After informed consent was obtained, the patient was taken to  the diagnostic EP lab in a fasting state.  After the usual preparation and  draping, intravenous fentanyl and midazolam was given for sedation.  A 6  French hexapolar catheter was inserted percutaneously in the right jugular  vein and advanced to the coronary sinus.  A 7 French 20 Halo catheter was  inserted percutaneously in the right femoral vein and advanced to the right  atrium.  A 5 French quadripolar catheter was inserted percutaneously in the  right femoral vein and advanced to the His bundle region.  After measurement  basic intervals, mapping was carried out demonstrating typical  counterclockwise tricuspid annual reentrant atrial flutter.  In the RAO  projection, mapping demonstrated a very long atrial flutter isthmus.  The  ablation catheter was subsequently maneuvered into the unusual atrial  flutter isthmus and six RF energy  applications were delivered resulting in  termination of atrial flutter and creation of bidirectional block in the  atrial flutter isthmus.  Six bonus RF energy applications were also  delivered and the patient was observed for 40 minutes.  During this time,  there was no evidence of any residual isthmus conduction.  At this point,  the catheters were removed, hemostasis was assured, and the patient returned  to his room in satisfactory condition.   COMPLICATIONS:  There were no immediate complications.   RESULTS:  A.  Baseline ECG:  Baseline ECG demonstrates atrial flutter with  variable AV conduction and left axis deviation with incomplete right bundle  branch block.  B.  Baseline intervals:  Atrial flutter cycle length was 260 msc.  The HP  interval was 41 msc.  C.  Rapid atrial pacing:  Following catheter ablation, rapid atrial pacing  was carried out from the coronary sinus demonstrating an AV Wenckebach cycle  length of 470 msc.  During rapid atrial pacing  the PR interval remained less  than the R interval.  D.  Programmed atrial stimulation:  Programmed atrial stimulation was  carried out from the coronary sinus at a pacing cycle length of 600 msc.  The S1 and S2 intervals decreased from 540 msc down to 390 msc where the AV  node ERP was observed.  On programmed atrial stimulation there were no H  jumps, no echo beats.  E.  Arrhythmia observed:  1. Atrial flutter initiation present at the time of VP study, direction     sustained, termination catheter ablation.     A. Mapping:  Mapping of the patient's atrial flutter demonstrated typical        counterclockwise tricuspid annular re-entry.     B. RF energy application.  A total of 12 RF energy  applications        including 6 bonus RF energy applications were delivered.  During the        6th RF energy application there was termination of atrial flutter and        restoration of sinus rhythm with the creation of bidirectional  block        and atrial flutter isthmus.  Six bonus RF energy applications were        delivered and the patient observed 40 minutes with no isthmus        conduction.   CONCLUSION:  This study demonstrates typical atrial flutter status post  successful electrophysiologic study and RF catheter ablation of atrial  flutter resulting in termination of atrial flutter, restoration of sinus  rhythm, and creation of bidirectional block and atrial flutter isthmus.                                               Doylene Canning. Ladona Ridgel, M.D. [facility]    GWT/MEDQ  D:  03/06/2002  T:  03/09/2002  Job:  [id number]   cc:   Lonzo Cloud. Kriste Basque, M.D. [facility]

## 2011-01-17 ENCOUNTER — Other Ambulatory Visit: Payer: Self-pay | Admitting: Pulmonary Disease

## 2011-01-28 ENCOUNTER — Other Ambulatory Visit: Payer: Self-pay | Admitting: Pulmonary Disease

## 2011-03-31 ENCOUNTER — Telehealth: Payer: Self-pay | Admitting: Pulmonary Disease

## 2011-03-31 MED ORDER — ETODOLAC 400 MG PO TB24: 400.0000 mg | ORAL_TABLET | Freq: Every day | ORAL | Status: DC

## 2011-03-31 NOTE — Telephone Encounter (Signed)
Spoke with pt. He is requesting refill on etodolac. I advised okay to refill, but needs to sched appt since he is overdue. Appt sched with SN for 04-13-11 at noon and rx refill sent x 1 only.

## 2011-04-03 ENCOUNTER — Other Ambulatory Visit: Payer: Self-pay | Admitting: *Deleted

## 2011-04-03 MED ORDER — ETODOLAC 400 MG PO TB24: 400.0000 mg | ORAL_TABLET | Freq: Every day | ORAL | Status: DC

## 2011-04-05 ENCOUNTER — Other Ambulatory Visit: Payer: Self-pay | Admitting: Pulmonary Disease

## 2011-04-13 ENCOUNTER — Ambulatory Visit (INDEPENDENT_AMBULATORY_CARE_PROVIDER_SITE_OTHER): Payer: Medicare Other | Admitting: Pulmonary Disease

## 2011-04-13 ENCOUNTER — Encounter: Payer: Self-pay | Admitting: Pulmonary Disease

## 2011-04-13 ENCOUNTER — Ambulatory Visit (INDEPENDENT_AMBULATORY_CARE_PROVIDER_SITE_OTHER)
Admission: RE | Admit: 2011-04-13 | Discharge: 2011-04-13 | Disposition: A | Discharge status: Home / Self Care | Payer: Medicare Other | Source: Ambulatory Visit | Attending: Pulmonary Disease | Admitting: Pulmonary Disease

## 2011-04-13 DIAGNOSIS — I050 Rheumatic mitral stenosis: Secondary | ICD-10-CM

## 2011-04-13 DIAGNOSIS — M1990 Unspecified osteoarthritis, unspecified site: Secondary | ICD-10-CM

## 2011-04-13 DIAGNOSIS — J449 Chronic obstructive pulmonary disease, unspecified: Secondary | ICD-10-CM

## 2011-04-13 DIAGNOSIS — I2510 Atherosclerotic heart disease of native coronary artery without angina pectoris: Secondary | ICD-10-CM

## 2011-04-13 DIAGNOSIS — K5900 Constipation, unspecified: Secondary | ICD-10-CM

## 2011-04-13 DIAGNOSIS — I38 Endocarditis, valve unspecified: Secondary | ICD-10-CM

## 2011-04-13 DIAGNOSIS — Z23 Encounter for immunization: Secondary | ICD-10-CM

## 2011-04-13 DIAGNOSIS — I359 Nonrheumatic aortic valve disorder, unspecified: Secondary | ICD-10-CM

## 2011-04-13 DIAGNOSIS — I10 Essential (primary) hypertension: Secondary | ICD-10-CM

## 2011-04-13 DIAGNOSIS — N401 Enlarged prostate with lower urinary tract symptoms: Secondary | ICD-10-CM

## 2011-04-13 DIAGNOSIS — E7800 Pure hypercholesterolemia, unspecified: Secondary | ICD-10-CM

## 2011-04-13 DIAGNOSIS — K5730 Diverticulosis of large intestine without perforation or abscess without bleeding: Secondary | ICD-10-CM

## 2011-04-13 DIAGNOSIS — J209 Acute bronchitis, unspecified: Secondary | ICD-10-CM

## 2011-04-13 DIAGNOSIS — I4510 Unspecified right bundle-branch block: Secondary | ICD-10-CM

## 2011-04-13 DIAGNOSIS — J4489 Other specified chronic obstructive pulmonary disease: Secondary | ICD-10-CM

## 2011-04-13 DIAGNOSIS — K219 Gastro-esophageal reflux disease without esophagitis: Secondary | ICD-10-CM

## 2011-04-13 DIAGNOSIS — R972 Elevated prostate specific antigen [PSA]: Secondary | ICD-10-CM

## 2011-04-13 MED ORDER — RANITIDINE HCL 150 MG PO CAPS: 150.0000 mg | ORAL_CAPSULE | Freq: Every day | ORAL | Status: DC

## 2011-04-13 MED ORDER — ESOMEPRAZOLE MAGNESIUM 40 MG PO CPDR: 40.0000 mg | DELAYED_RELEASE_CAPSULE | Freq: Every day | ORAL | Status: DC

## 2011-04-13 MED ORDER — NYSTATIN 100000 UNIT/ML MT SUSP: OROMUCOSAL | Status: DC

## 2011-04-13 NOTE — Patient Instructions (Signed)
Today we updated your med list in EPIC...    We decided to change the Omeprazole to NEXIUM 40mg  take 1 cap 30 min before the 1st meal of the day...  Today we did your follow up CXR...    Please call the [facility]REE in a few days for your results...    Dial [id number] & when prompted enter your patient number followed by the # symbol...    Your patient number is:  [id number]#  Your Cholesterol is NOT at the goal of <70... (last labs had LDL=124)    Diet & Fish Oil alone won't get you to goal; you need a low dose statin medication to get you there...    Call me if you want to start on CRESTOR 5mg /d (lowest dose, most effective statin out there)...    Alternatively you may want to discuss this w/ drKatz at your next appt...  Call for any problems...  Let's plan a follow up visit in 6 months.Marland KitchenMarland Kitchen

## 2011-04-13 NOTE — Progress Notes (Signed)
Subjective:    Patient ID: Christian [name], male    DOB: 02-Oct-1931, 77 y.o.   MRN: [id number]  HPI 77 y/o WM here for a follow up visit... he has mult medical problems as noted below...  Followed for general medical purposes w/ hx COPD, ex-smoker, elev right hemidiaph, CAD & mild AS followed by Delton See, Hx AFlutter s/p ablation in 2003 by DrTaylor, Hypercholesterolemia on diet alone, BPH on 3 meds per DrPeterson, and hx MM in mediastinum 1980's (no recurrence)...  ~  January 30, 2010:  he notes some dizziness but on careful questioning he is describing dizziness after bending/ straining/ w/ valsalva maneuver> we discussed this physiology & he understands...  breathing has been stable;  no CP/ palpit/ ch in dyspnea/ etc;  GI remains stable on meds;  GU followed by DrPeterson on 3 meds- stable;  no new complaints or concerns...  ~  July 31, 2010:  58mo ROV- he continues to do well w/o new complaints or concerns...he saw DrKatz 9/11- HBP, CAD, RBBB, MS> all stable & no changes made... he had f/u DrPeterson Urology 10/11 w/ PSA reported at 5.28 (19% free) & they are continuing to follow... wants ZPak & Etodolac refilled today.  ~  April 13, 2011:  8-53mo ROV & he's noted some DOE & some intermittent heartburn; we discussed a regular exercise program & taking his PPI 30 min before a meal for max benefit; he wants oral Nystatin suspension for mouth symptoms...    COPD> remote smoking hx, stable on AdvairBid & ProairPrn, he insists on Tussionex for cough & ZPak to keep on hand; has elev left hemidiaph, prev median sternotomy for mediastinal melanoma surg in the 1980s; prev CXR & PFTs below==> f/u CXR today chr changes left base, NAD...    HBP> controlled on Atenolol & BP= 120/66 today w/o CP, palpit, ch in SOB/ edema, etc; NOTE: he was prev on DiovanHCT & he stopped this on his own $$...    CAD, Valv heart dis> followed by Delton See on ASA & Aten, last seen 9/11 w/ f/u due soon...    RBBB, Hx AFlutter>  stable & denies CP, palpit, dizzy, ch in DOE/ edema/ etc...    VI, Edema> he knows to elim sodium, elev legs, wear support hose, etc...    Hyperchol> on Fish Oil + Diet; he was rec to start Crestor5mg  but he declined statin therapy & prefers diet alone...    GI> GERD, Divertics> on Prilosec 20mg  + Zantac Qhs; he wants stronger acid suppressor & we will switch to Nexium...    BPH, Elev PSA> on Flomax, Avodart & Sanctura per DrPeterson etal...    DJD> on Etodolac prn...    Chronic persistant insomnia> on Ambien Qhs...    Hx Malig Melanoma> he has surg in the 1980's (mediastinal melanoma s/p median sternotomy) & no know recurrence; he is followed for Derm by DrDJones & had a squamous cell skin cancer removed 3/12...          Problem List:  ALLERGIC RHINITIS (ICD-477.9) - uses OTC antihist Prn & Flonase spray...  COPD (ICD-496) & DISORDERS OF DIAPHRAGM (ICD-519.4) - he is an ex-smoker- having smoked <39yrs, and quit >2yrs ago... stable on ADVAIR 500 Prn, PROAIR Prn, MUCINEX 2Bid Prn... otherwise doing well, mows yard etc... still says he needs the TUSSIONEX for cough...  ~  SCANS:  9/03= elev L hemidaiph w/ scarring at base, & RLL infiltrate resolved... ~  baseline CXR 2/08 - s/p median sternotomy (mediastinal  melanoma surg 1980's), elev L hemidiaph & chr changes, NAD.Marland Kitchen. ~  f/u CXR 03/13/08 = similar chr changes on the left... ~  PFT 1/06 - FVC 2.46 (54%), FEV1= 1.64 (46%), %1sec=67, mid-flows=20%. FEV1 in 1998 was 1.89. ~  PFT 8/09 showed FVC= 2.61 (58%), FEV1= 1.73 (49%), %1sec=66, mid-flows= 29%... ~  CXR 1/11 showed stable COPD, NAD.Marland Kitchen. ~  CXR 9/12 showed chr changes at the left base, NAD...  ESSENTIAL HYPERTENSION (ICD-401.9) - he's been on ATENOLOL 50mg /d, & BP noted to be mildly elevated 4/11 and started on DiovanHCT but he subsequently stopped this on his own in 2012... ~  6/11:  BP= 110/60 & similar at home... offered to ch Diovan but he prefers to continue 1/2 tab daily. ~  12/11:  BP=  122/68 & encouraged to continue both meds (Aten & DiovanHCT). ~  9/12:  BP= 120/66 on Aten alone (he stopped the Diovan on his own)...  CORONARY ARTERY DISEASE (ICD-414.00) - takes ATENOLOL 50mg /d, & ASA 81mg /d... followed by Delton See & his notes are reviewed. ~   min non-obstructive CAD on cath 7/03 w/ luminal irregs to 30% in Circ... ~  cath 6/09 showed 30-40% LAD, 40-50% RCA, & 20% CIRC = non-obstructive CAD  RIGHT BUNDLE BRANCH BLOCK (ICD-426.4) - baseline EKG is NSR, RBBB...  VALVULAR HEART DISEASE (ICD-424.90) & Hx of FLUTTER, ATRIAL (ICD-427.32) - s/p cath ablation in 2003 by DrTaylor... ~  2DEcho 2/06 showed conc LVH, sclerotic AoV w/o AS, low-norm LVF... ~  2DEcho 6/09 showed mod calcif AoV w/ reduced leaflet excursion c/w mild AS, mod mitral annular calcif w/ mild MS, dil LA & RV, mild incr PA sys~ 40, norm LVF w/ EF=55-60%  VENOUS INSUFFICIENCY (ICD-459.81) w/ EDEMA (ICD-782.3) - treats w/ low sodium diet;  exam shows superfic VV, no signif edema, etc...  HYPERCHOLESTEROLEMIA (ICD-272.0) - on diet + FISH OIL... ~  FLP 6/08 showed TChol 180, TG 37, HDL 52, LDL 121... he was not interested in Statin therapy. ~  FLP 4/10 showed TChol 189, TG 34, HDL 53, LDL 130... not at goal- needs meds/ he prefers diet. ~  FLP 1/11 showed TChol 195, TG 38, HDL 54, LDL 133... needs better diet. ~  FLP 12/11 showed TChol 187, TG 35, HDL 56, LDL 124... He declines low dose statin therapy.  GERD (ICD-530.81) - on OMEPRAZOLE 20mg /d, and ZANTAC 150mg Qhs... ~  9/12: presents c/o incr reflux/ heartburn> wants stronger PPI & switched to NEXIUM 40mg /d...  DIVERTICULOSIS OF COLON (ICD-562.10) - notes some constipation Rx w/ MIRALAX... ~  last colonoscopy 2/05 by DrPatterson showed divertics only...   HYPERTROPHY PROSTATE W/UR OBST & OTH LUTS (ICD-600.01) - treated by DrPeterson w/ AVODART 0.5mg /d, FLOMAX 0.4mg Bid, & SANCTURA 20mg Bid... Urology follows his PSAs.   PSA, INCREASED (ICD-790.93) - pt states that  PSA was as high as 9 and improved to 5.6.Marland KitchenMarland Kitchen pt reports recent eval for hematuria- note pending...  ~  10/10: he reports his PSA 10/10 DrPeterson was 5.3 ~  6/11: he reports that his PSA recently = 5.1 ~  12/11: PSA per DrPeterson recently = 5.28 & they continue to follow...  DEGENERATIVE JOINT DISEASE (ICD-715.90) - on Mobic but he prefers ETODOLAC 400mg  Bid Prn.  INSOMNIA, CHRONIC (ICD-307.42) - uses AMBIEN 10mg  Qhs...  MALIGNANT MELANOMA, HX OF (ICD-V10.82) - s/p surgery of mediastinal melanoma 1980's... no known recurrence... ~  3/12: he tells me that DrDJones removed a squamous cell skin cancer...   Past Surgical History  Procedure Date  .  Bilateral inguinal hernia repair   . Umbilical hernia repair   . Median sternotomy for mediastinal melanoma 1980s    Outpatient Encounter Prescriptions as of 04/13/2011  Medication Sig Dispense Refill  . ADVAIR DISKUS 500-50 MCG/DOSE AEPB INHALE 1 PUFF TWICE A DAY  1 each  4  . albuterol (PROVENTIL HFA;VENTOLIN HFA) 108 (90 BASE) MCG/ACT inhaler Inhale 2 puffs into the lungs every 6 (six) hours as needed.        Marland Kitchen aspirin 81 MG tablet Take 81 mg by mouth daily.        Marland Kitchen atenolol (TENORMIN) 25 MG tablet Take 25 mg by mouth 2 (two) times daily.        . chlorpheniramine-HYDROcodone (TUSSIONEX PENNKINETIC ER) 10-8 MG/5ML LQCR Take 5 mLs by mouth every 12 (twelve) hours.        . Cranberry (CRANBERRY CONCENTRATE) 500 MG CAPS Take 1 capsule by mouth daily.        Marland Kitchen dextromethorphan-guaiFENesin (MUCINEX DM) 30-600 MG per 12 hr tablet Take 1 tablet by mouth every 12 (twelve) hours.        . dutasteride (AVODART) 0.5 MG capsule Take 0.5 mg by mouth daily.        Marland Kitchen etodolac (ETODOLAC) 400 MG 24 hr tablet Take 1 tablet (400 mg total) by mouth daily.  60 tablet  6  . fish oil-omega-3 fatty acids 1000 MG capsule Take 2 g by mouth daily.        . fluticasone (FLONASE) 50 MCG/ACT nasal spray Place 2 sprays into the nose daily.        Marland Kitchen nystatin (MYCOSTATIN)  100000 UNIT/ML suspension GARGLE WITH 1 TEASPOONFUL AND SWALLOW AS DIRECTED AS NEEDED. (MAY CAUSE DROWSINESS)  120 mL  5  . omeprazole (PRILOSEC) 20 MG capsule TAKE 1 CAPSULE EVERY DAY  30 capsule  11  . polyethylene glycol (MIRALAX / GLYCOLAX) packet Take 17 g by mouth daily.        . ranitidine (ZANTAC) 150 MG capsule TAKE 1 CAPSULE EVERY DAY  30 capsule  10  . Tamsulosin HCl (FLOMAX) 0.4 MG CAPS Take 0.4 mg by mouth daily after supper.        . trospium (SANCTURA) 20 MG tablet Take 20 mg by mouth 2 (two) times daily.        Marland Kitchen zolpidem (AMBIEN) 10 MG tablet Take 10 mg by mouth at bedtime as needed.        Marland Kitchen DIOVAN HCT 160-12.5 MG per tablet TAKE 1/2 BY MOUTH ONCE DAILY  30 tablet  5    No Known Allergies   Current Medications, Allergies, Past Medical History, Past Surgical History, Family History, and Social History were reviewed in [employer] record.    Review of Systems         See HPI - all other systems neg except as noted...  The patient complains of dyspnea on exertion.  The patient denies anorexia, fever, weight loss, weight gain, vision loss, decreased hearing, hoarseness, chest pain, syncope, peripheral edema, prolonged cough, headaches, hemoptysis, abdominal pain, melena, hematochezia, severe indigestion/heartburn, hematuria, incontinence, muscle weakness, suspicious skin lesions, transient blindness, difficulty walking, depression, unusual weight change, abnormal bleeding, enlarged lymph nodes, and angioedema.   Objective:   Physical Exam     WD, WN, 77 y/o WM in NAD... GENERAL:  Alert & oriented; pleasant & cooperative... HEENT:  [place]/AT, EOM-wnl, PERRLA, EACs-clear, TMs-wnl, NOSE-clear, THROAT-clear & wnl. NECK:  Supple w/ fairROM; no JVD; normal carotid impulses  w/o bruits; no thyromegaly or nodules palpated; no lymphadenopathy. CHEST:  Decr BS in bases , clear without wheezes/ rales/ or rhonchi, s/p median sternotomy. HEART:  regular rhythm; gr 2/6  sys murmur, without rubs of gallops appreciated... ABDOMEN:  Soft & nontender; normal bowel sounds; no organomegaly or masses detected. EXT: without deformities, mild arthritic changes, +ven insuffic w/ superfic VV- no c/c/edema... NEURO:  CN's intact; motor testing normal; sensory testing normal; gait normal & balance OK. DERM:  no rash, no skin lesions noted...   Assessment & Plan:   COPD> remote smoking hx, stable on AdvairBid & ProairPrn, he insists on Tussionex for cough & ZPak to keep on hand; has elev left hemidiaph, prev median sternotomy for mediastinal melanoma surg in the 1980s; prev CXR & PFTs below==> f/u CXR today chr changes left base, NAD...     HBP> controlled on Atenolol;  BP= 120/66 today w/o CP, palpit, ch in SOB/ edema, etc; NOTE: he was prev on DiovanHCT & he stopped this on his own $$...     CAD, Valv heart dis> followed by Delton See on ASA & Aten, last seen 9/11 w/ f/u due soon...     RBBB, Hx AFlutter> stable & denies CP, palpit, dizzy, ch in DOE/ edema/ etc...     VI, Edema> he knows to elim sodium, elev legs, wear support hose, etc...     Hyperchol> on Fish Oil + Diet; he was rec to start Crestor5mg  but he declined statin therapy & prefers diet alone...     GI> GERD, Divertics> on Prilosec 20mg  + Zantac Qhs; he wants stronger acid suppressor & we will switch to Nexium...    BPH, Elev PSA> on Flomax, Avodart & Sanctura per [employer]; continue same & they follow his PSAs...     DJD> on Etodolac prn...     Chronic persistant insomnia> on Ambien Qhs...     Hx Malig Melanoma> he has surg in the 1980's (mediastinal melanoma s/p median sternotomy) & no know recurrence; he is followed for Derm by DrDJones & had a squamous cell skin cancer removed 3/12.Marland KitchenMarland Kitchen

## 2011-04-16 ENCOUNTER — Other Ambulatory Visit: Payer: Self-pay | Admitting: Cardiology

## 2011-04-25 ENCOUNTER — Encounter: Payer: Self-pay | Admitting: Pulmonary Disease

## 2011-04-25 ENCOUNTER — Other Ambulatory Visit: Payer: Self-pay | Admitting: Pulmonary Disease

## 2011-04-30 LAB — POCT I-STAT 3, ART BLOOD GAS (G3+)
O2 Saturation: 96
Operator id: 141321
pO2, Arterial: 81

## 2011-04-30 LAB — POCT I-STAT 3, VENOUS BLOOD GAS (G3P V)
Acid-base deficit: 1
O2 Saturation: 71
Operator id: 141321
pCO2, Ven: 41.3 — ABNORMAL LOW

## 2011-05-12 ENCOUNTER — Other Ambulatory Visit: Payer: Self-pay | Admitting: Dermatology

## 2011-05-15 ENCOUNTER — Telehealth: Payer: Self-pay | Admitting: Pulmonary Disease

## 2011-05-15 MED ORDER — ZOLPIDEM TARTRATE 10 MG PO TABS: 10.0000 mg | ORAL_TABLET | Freq: Every evening | ORAL | Status: DC | PRN

## 2011-05-15 NOTE — Telephone Encounter (Signed)
Pt last seen by SN 9.10.12.  ambien 10mg  on med list.  Called spoke with patient, verified medication name/strength and pharmacy.  rx telephoned to [facility] [address].

## 2011-05-18 ENCOUNTER — Telehealth: Payer: Self-pay | Admitting: Pulmonary Disease

## 2011-05-18 DIAGNOSIS — E7800 Pure hypercholesterolemia, unspecified: Secondary | ICD-10-CM

## 2011-05-18 DIAGNOSIS — I10 Essential (primary) hypertension: Secondary | ICD-10-CM

## 2011-05-18 DIAGNOSIS — R0600 Dyspnea, unspecified: Secondary | ICD-10-CM

## 2011-05-18 DIAGNOSIS — R51 Headache: Secondary | ICD-10-CM

## 2011-05-18 NOTE — Telephone Encounter (Signed)
Per SN---we will not be able to see pt until Friday---pt can come by tomorrow for fasting labs and we can schedule ct scan of brain---this will be part of the workup.  Its up to the pt.  thanks

## 2011-05-18 NOTE — Telephone Encounter (Signed)
Pt will need cbcd,hepat, lipid, tsh and psa.  Will need ct brain with contrast.  thanks

## 2011-05-18 NOTE — Telephone Encounter (Signed)
Per lori pt is aware of what is needed and pt is coming tomorrow for his labs to be drawn. Order has been placed for labs and ct of head. Nothing further was needed

## 2011-05-18 NOTE — Telephone Encounter (Signed)
The patient c/o pain behind his right eye since Thurs., 10/11 and says he is unsteady when walking and has a persistent throbbing headache. He also says he has blurred vision in both eyes. He last saw his opthalmologist one month ago. Pls advise.No Known Allergies

## 2011-05-18 NOTE — Telephone Encounter (Signed)
Pt is scheduled for appt with Sn on Fri., 10/19 @ 2:45pm. Pls advise which labs are needed (pt will come by to have blood drawn in the morning, fasting) is ct brain w/ or w/o contrast? Pls advise.

## 2011-05-19 ENCOUNTER — Telehealth: Payer: Self-pay | Admitting: *Deleted

## 2011-05-19 ENCOUNTER — Other Ambulatory Visit: Payer: Self-pay | Admitting: Pulmonary Disease

## 2011-05-19 ENCOUNTER — Ambulatory Visit (INDEPENDENT_AMBULATORY_CARE_PROVIDER_SITE_OTHER)
Admission: RE | Admit: 2011-05-19 | Discharge: 2011-05-19 | Disposition: A | Discharge status: Home / Self Care | Payer: Medicare Other | Source: Ambulatory Visit | Attending: Pulmonary Disease | Admitting: Pulmonary Disease

## 2011-05-19 ENCOUNTER — Other Ambulatory Visit (INDEPENDENT_AMBULATORY_CARE_PROVIDER_SITE_OTHER): Payer: Medicare Other

## 2011-05-19 DIAGNOSIS — R51 Headache: Secondary | ICD-10-CM

## 2011-05-19 DIAGNOSIS — I10 Essential (primary) hypertension: Secondary | ICD-10-CM

## 2011-05-19 DIAGNOSIS — R0600 Dyspnea, unspecified: Secondary | ICD-10-CM

## 2011-05-19 DIAGNOSIS — E7800 Pure hypercholesterolemia, unspecified: Secondary | ICD-10-CM

## 2011-05-19 DIAGNOSIS — R0609 Other forms of dyspnea: Secondary | ICD-10-CM

## 2011-05-19 DIAGNOSIS — Z125 Encounter for screening for malignant neoplasm of prostate: Secondary | ICD-10-CM

## 2011-05-19 LAB — CBC WITH DIFFERENTIAL/PLATELET
Basophils Relative: 0.3 % (ref 0.0–3.0)
Eosinophils Relative: 2.7 % (ref 0.0–5.0)
HCT: 42.4 % (ref 39.0–52.0)
Monocytes Relative: 9.6 % (ref 3.0–12.0)
Neutrophils Relative %: 77.5 % — ABNORMAL HIGH (ref 43.0–77.0)
Platelets: 188 10*3/uL (ref 150.0–400.0)
RBC: 4.68 Mil/uL (ref 4.22–5.81)
WBC: 6.9 10*3/uL (ref 4.5–10.5)

## 2011-05-19 LAB — LIPID PANEL
LDL Cholesterol: 123 mg/dL — ABNORMAL HIGH (ref 0–99)
Total CHOL/HDL Ratio: 4

## 2011-05-19 LAB — HEPATIC FUNCTION PANEL
ALT: 20 U/L (ref 0–53)
AST: 21 U/L (ref 0–37)
Bilirubin, Direct: 0.2 mg/dL (ref 0.0–0.3)
Total Bilirubin: 1.1 mg/dL (ref 0.3–1.2)

## 2011-05-19 LAB — BASIC METABOLIC PANEL
Calcium: 9 mg/dL (ref 8.4–10.5)
GFR: 99.6 mL/min (ref >60.00)
Glucose, Bld: 83 mg/dL (ref 70–99)
Potassium: 4.5 mEq/L (ref 3.5–5.1)
Sodium: 141 mEq/L (ref 135–145)

## 2011-05-19 LAB — PSA: PSA: 6.11 ng/mL — ABNORMAL HIGH (ref 0.10–4.00)

## 2011-05-19 MED ORDER — IOHEXOL 300 MG/ML  SOLN
80.0000 mL | Freq: Once | INTRAMUSCULAR | Status: AC | PRN
  Administered 2011-05-19: 80 mL via INTRAVENOUS

## 2011-05-19 NOTE — Telephone Encounter (Signed)
Rose states that our lab is aware of ADD ON order(Karen in lab) and will process once ADD ON request is faxed. I have faxed the request to lab and Clydie Braun is aware-no order needed in [facility] as it will create a duplicate/mix up. Results will still be routed to SN as usual.

## 2011-05-22 ENCOUNTER — Encounter: Payer: Self-pay | Admitting: Neurology

## 2011-05-22 ENCOUNTER — Encounter: Payer: Self-pay | Admitting: Pulmonary Disease

## 2011-05-22 ENCOUNTER — Ambulatory Visit (INDEPENDENT_AMBULATORY_CARE_PROVIDER_SITE_OTHER): Payer: Medicare Other | Admitting: Pulmonary Disease

## 2011-05-22 VITALS — BP 110/74 | HR 86 | Temp 98.6°F | Resp 16 | Ht 72.0 in | Wt 183.6 lb

## 2011-05-22 DIAGNOSIS — E7800 Pure hypercholesterolemia, unspecified: Secondary | ICD-10-CM

## 2011-05-22 DIAGNOSIS — R972 Elevated prostate specific antigen [PSA]: Secondary | ICD-10-CM

## 2011-05-22 DIAGNOSIS — I4510 Unspecified right bundle-branch block: Secondary | ICD-10-CM

## 2011-05-22 DIAGNOSIS — J449 Chronic obstructive pulmonary disease, unspecified: Secondary | ICD-10-CM

## 2011-05-22 DIAGNOSIS — G459 Transient cerebral ischemic attack, unspecified: Secondary | ICD-10-CM

## 2011-05-22 DIAGNOSIS — I2510 Atherosclerotic heart disease of native coronary artery without angina pectoris: Secondary | ICD-10-CM

## 2011-05-22 DIAGNOSIS — I050 Rheumatic mitral stenosis: Secondary | ICD-10-CM

## 2011-05-22 DIAGNOSIS — M1990 Unspecified osteoarthritis, unspecified site: Secondary | ICD-10-CM

## 2011-05-22 DIAGNOSIS — Z85820 Personal history of malignant melanoma of skin: Secondary | ICD-10-CM

## 2011-05-22 DIAGNOSIS — N401 Enlarged prostate with lower urinary tract symptoms: Secondary | ICD-10-CM

## 2011-05-22 DIAGNOSIS — I359 Nonrheumatic aortic valve disorder, unspecified: Secondary | ICD-10-CM

## 2011-05-22 DIAGNOSIS — I10 Essential (primary) hypertension: Secondary | ICD-10-CM

## 2011-05-22 NOTE — Progress Notes (Signed)
Subjective:    Patient ID: Christian [name], male    DOB: [birth year], 77 y.o.   MRN: [id number]  HPI 77 y/o WM here for a follow up visit... he has mult medical problems as noted below...  Followed for general medical purposes w/ hx COPD, ex-smoker, elev right hemidiaph, CAD & mild AS followed by Delton See, Hx AFlutter s/p ablation in 2003 by DrTaylor, Hypercholesterolemia on diet alone, BPH on 3 meds per DrPeterson, and hx MM in mediastinum 1980's (no recurrence)...  ~  January 30, 2010:  he notes some dizziness but on careful questioning he is describing dizziness after bending/ straining/ w/ valsalva maneuver> we discussed this physiology & he understands...  breathing has been stable;  no CP/ palpit/ ch in dyspnea/ etc;  GI remains stable on meds;  GU followed by DrPeterson on 3 meds- stable;  no new complaints or concerns...  ~  July 31, 2010:  56mo ROV- he continues to do well w/o new complaints or concerns...he saw DrKatz 9/11- HBP, CAD, RBBB, MS> all stable & no changes made... he had f/u DrPeterson Urology 10/11 w/ PSA reported at 5.28 (19% free) & they are continuing to follow... wants ZPak & Etodolac refilled today.  ~  April 13, 2011:  8-41mo ROV & he's noted some DOE & some intermittent heartburn; we discussed a regular exercise program & taking his PPI 30 min before a meal for max benefit; he wants oral Nystatin suspension for mouth symptoms...    COPD> remote smoking hx, stable on AdvairBid & ProairPrn, he insists on Tussionex for cough & ZPak to keep on hand; has elev left hemidiaph, prev median sternotomy for mediastinal melanoma surg in the 1980s; prev CXR & PFTs below==> f/u CXR today chr changes left base, NAD...    HBP> controlled on Atenolol & BP= 120/66 today w/o CP, palpit, ch in SOB/ edema, etc; NOTE: he was prev on DiovanHCT & he stopped this on his own $$...    CAD, Valv heart dis> followed by Delton See on ASA & Aten, last seen 9/11 w/ f/u due soon...    RBBB, Hx AFlutter>  stable & denies CP, palpit, dizzy, ch in DOE/ edema/ etc...    VI, Edema> he knows to elim sodium, elev legs, wear support hose, etc...    Hyperchol> on Fish Oil + Diet; he was rec to start Crestor5mg  but he declined statin therapy & prefers diet alone...    GI> GERD, Divertics> on Prilosec 20mg  + Zantac Qhs; he wants stronger acid suppressor & we will switch to Nexium...    BPH, Elev PSA> on Flomax, Avodart & Sanctura per DrPeterson etal...    DJD> on Etodolac prn...    Chronic persistant insomnia> on Ambien Qhs...    Hx Malig Melanoma> he has surg in the 1980's (mediastinal melanoma s/p median sternotomy) & no know recurrence; he is followed for Derm by DrDJones & had a squamous cell skin cancer removed 3/12...  ~  May 22, 2011:  6 week ROV and add on for neurologic symptoms; he states that on 05/12/11 he saw DrDJones for several skin lesions, and was given Xylocaine on his face and arm (the lesions were basal cell carcinomas); later that day he developed pain over his right eye, headache, dizziness, confusion, and irritability; he states that his vision was blurry and that he was unsteady on his feet and perhaps slightly weak in the left leg; there was no CP, palpit, slurring of speech, etc; he also noted what  he calls "migraine aurora" which he says he gets from time to time but has increased in frequency lately (he denies migraine pain "I just get the aurora"); he did not go to the emergency room with these symptoms, but rather called our office for evaluation; since I could not see him for several days we decided to proceed with lab work and CT brain; the lab work was all essentially within normal limits (except the PSA of 6.11); the CT brain showed essentially within normal limits, without mass, lesions intracranial hemorrhage, or acute infarct noted; as he still has some symptoms at present we felt it was best to refer him to neurology, and suspect that they will want to get an MRI for further  evaluation; in the meanwhile it is recommended that he increase his 81 mg aspirin from a once a day to twice a day.          Problem List:     He did not bring his med bottles or list of meds to the office today>>  ALLERGIC RHINITIS (ICD-477.9) - uses OTC antihist Prn & Flonase spray...  COPD (ICD-496) & DISORDERS OF DIAPHRAGM (ICD-519.4) - he is an ex-smoker- having smoked <17yrs, and quit >41yrs ago... stable on ADVAIR 500 Prn, PROAIR Prn, MUCINEX 2Bid Prn... otherwise doing well, mows yard etc... still says he needs the TUSSIONEX for cough...  ~  SCANS:  9/03= elev L hemidaiph w/ scarring at base, & RLL infiltrate resolved... ~  baseline CXR 2/08 - s/p median sternotomy (mediastinal melanoma surg 1980's), elev L hemidiaph & chr changes, NAD.Marland Kitchen. ~  f/u CXR 03/13/08 = similar chr changes on the left... ~  PFT 1/06 - FVC 2.46 (54%), FEV1= 1.64 (46%), %1sec=67, mid-flows=20%. FEV1 in 1998 was 1.89. ~  PFT 8/09 showed FVC= 2.61 (58%), FEV1= 1.73 (49%), %1sec=66, mid-flows= 29%... ~  CXR 1/11 showed stable COPD, NAD.Marland Kitchen. ~  CXR 9/12 showed chr changes at the left base, NAD...  ESSENTIAL HYPERTENSION (ICD-401.9) - he's been on ATENOLOL 50mg /d, & BP noted to be mildly elevated 4/11 and started on DiovanHCT but he subsequently stopped this on his own in 2012... ~  6/11:  BP= 110/60 & similar at home... offered to ch Diovan but he prefers to continue 1/2 tab daily. ~  12/11:  BP= 122/68 & encouraged to continue both meds (Aten & DiovanHCT). ~  9/12:  BP= 120/66 on Aten alone (he stopped the Diovan on his own)... ~  10/12:  BP= 110/74 & their is some confusion that he might have restarted the DiovanHCT, & we decided to stop this med.  CORONARY ARTERY DISEASE (ICD-414.00) - takes ATENOLOL 50mg /d, & ASA 81mg /d... followed by Delton See & his notes are reviewed. ~   min non-obstructive CAD on cath 7/03 w/ luminal irregs to 30% in Circ... ~  cath 6/09 showed 30-40% LAD, 40-50% RCA, & 20% CIRC = non-obstructive  CAD  RIGHT BUNDLE BRANCH BLOCK (ICD-426.4) - baseline EKG is NSR, RBBB...  VALVULAR HEART DISEASE (ICD-424.90) & Hx of FLUTTER, ATRIAL (ICD-427.32) - s/p cath ablation in 2003 by DrTaylor... ~  2DEcho 2/06 showed conc LVH, sclerotic AoV w/o AS, low-norm LVF... ~  2DEcho 6/09 showed mod calcif AoV w/ reduced leaflet excursion c/w mild AS, mod mitral annular calcif w/ mild MS, dil LA & RV, mild incr PA sys~ 40, norm LVF w/ EF=55-60%  VENOUS INSUFFICIENCY (ICD-459.81) w/ EDEMA (ICD-782.3) - treats w/ low sodium diet;  exam shows superfic VV, no signif edema, etc..Marland Kitchen  HYPERCHOLESTEROLEMIA (ICD-272.0) - on diet + FISH OIL... ~  FLP 6/08 showed TChol 180, TG 37, HDL 52, LDL 121... he was not interested in Statin therapy. ~  FLP 4/10 showed TChol 189, TG 34, HDL 53, LDL 130... not at goal- needs meds/ he prefers diet. ~  FLP 1/11 showed TChol 195, TG 38, HDL 54, LDL 133... needs better diet. ~  FLP 12/11 showed TChol 187, TG 35, HDL 56, LDL 124... He declines low dose statin therapy. ~  FLP 10/12 showed TChol 180, TG 34, HDL 50, LDL 123  GERD (ICD-530.81) - on OMEPRAZOLE 20mg /d, and ZANTAC 150mg Qhs... ~  9/12: presents c/o incr reflux/ heartburn> wants stronger PPI & switched to NEXIUM 40mg /d...  DIVERTICULOSIS OF COLON (ICD-562.10) - notes some constipation Rx w/ MIRALAX... ~  last colonoscopy 2/05 by DrPatterson showed divertics only...   HYPERTROPHY PROSTATE W/UR OBST & OTH LUTS (ICD-600.01) - treated by DrPeterson w/ AVODART 0.5mg /d, FLOMAX 0.4mg Bid, & SANCTURA 20mg Bid... Urology follows his PSAs.   PSA, INCREASED (ICD-790.93) - pt states that PSA was as high as 9 and improved to 5.6.Marland KitchenMarland Kitchen pt reports recent eval for hematuria- note pending...  ~  10/10: he reports his PSA 10/10 DrPeterson was 5.3 ~  6/11: he reports that his PSA recently = 5.1 ~  12/11: PSA per DrPeterson recently = 5.28 & they continue to follow... ~  10/12: PSA here= 6.11 and we will send copy of lab to the Urology  team...  DEGENERATIVE JOINT DISEASE (ICD-715.90) - on Mobic but he prefers ETODOLAC 400mg  Bid Prn.  INSOMNIA, CHRONIC (ICD-307.42) - uses AMBIEN 10mg  Qhs...  MALIGNANT MELANOMA, HX OF (ICD-V10.82) - s/p surgery of mediastinal melanoma 1980's... no known recurrence... ~  3/12: he tells me that DrDJones removed a squamous cell skin cancer... ~  10/12: DrJones removed several skin cancers- one right cheek, one right forearm & referred pt for Moh's...   Past Surgical History  Procedure Date  . Bilateral inguinal hernia repair   . Umbilical hernia repair   . Median sternotomy for mediastinal melanoma 1980s    Outpatient Encounter Prescriptions as of 05/22/2011  Medication Sig Dispense Refill  . ADVAIR DISKUS 500-50 MCG/DOSE AEPB INHALE 1 PUFF TWICE A DAY  1 each  4  . albuterol (PROVENTIL HFA;VENTOLIN HFA) 108 (90 BASE) MCG/ACT inhaler Inhale 2 puffs into the lungs every 6 (six) hours as needed.        Marland Kitchen aspirin 81 MG tablet Take 81 mg by mouth daily.        Marland Kitchen atenolol (TENORMIN) 25 MG tablet TAKE 1 TABLET BY MOUTH TWO TIMES A DAY  60 tablet  5  . cephALEXin (KEFLEX) 500 MG capsule Once a day 3 days before surgery      . chlorpheniramine-HYDROcodone (TUSSIONEX PENNKINETIC ER) 10-8 MG/5ML LQCR Take 5 mLs by mouth every 12 (twelve) hours as needed.       . Cranberry (CRANBERRY CONCENTRATE) 500 MG CAPS Take 1 capsule by mouth daily.        Marland Kitchen dextromethorphan-guaiFENesin (MUCINEX DM) 30-600 MG per 12 hr tablet Take 1 tablet by mouth every 12 (twelve) hours.        . dutasteride (AVODART) 0.5 MG capsule Take 0.5 mg by mouth daily.        Marland Kitchen esomeprazole (NEXIUM) 40 MG capsule Take 1 capsule (40 mg total) by mouth daily. 30 minutes before 1st meal of the day  30 capsule  11  . etodolac (ETODOLAC) 400 MG 24 hr  tablet Take 1 tablet (400 mg total) by mouth daily.  60 tablet  6  . fluticasone (FLONASE) 50 MCG/ACT nasal spray Place 2 sprays into the nose daily as needed.       . nystatin (MYCOSTATIN)  100000 UNIT/ML suspension Gargle and swallow 1 tsp as directed, as needed  120 mL  5  . polyethylene glycol (MIRALAX / GLYCOLAX) packet Take 17 g by mouth daily.        . ranitidine (ZANTAC) 150 MG capsule Take 1 capsule (150 mg total) by mouth at bedtime.  30 capsule  11  . Tamsulosin HCl (FLOMAX) 0.4 MG CAPS Take 0.4 mg by mouth daily after supper.        . trospium (SANCTURA) 20 MG tablet Take 20 mg by mouth 2 (two) times daily.        Marland Kitchen zolpidem (AMBIEN) 10 MG tablet Take 1 tablet (10 mg total) by mouth at bedtime as needed.  30 tablet  5  . DISCONTD: DIOVAN HCT 160-12.5 MG per tablet TAKE 1/2 BY MOUTH ONCE DAILY  30 tablet  5  . DISCONTD: etodolac (LODINE) 400 MG tablet TAKE 1 TABLET (400 MG TOTAL) BY MOUTH DAILY.  60 tablet  0  . DISCONTD: fish oil-omega-3 fatty acids 1000 MG capsule Take 2 g by mouth daily.          No Known Allergies   Current Medications, Allergies, Past Medical History, Past Surgical History, Family History, and Social History were reviewed in [employer] record.    Review of Systems         See HPI - all other systems neg except as noted...  The patient complains of visual symptoms, headache, sl dizzy, intermittent confusion, some weakness, sl gait abnormality, etc.  The patient denies anorexia, fever, weight loss, weight gain, vision loss, decreased hearing, hoarseness, chest pain, syncope, peripheral edema, prolonged cough, hemoptysis, abdominal pain, melena, hematochezia, severe indigestion/heartburn, hematuria, incontinence, transient blindness, depression, unusual weight change, abnormal bleeding, enlarged lymph nodes, and angioedema.   Objective:   Physical Exam     WD, WN, 77 y/o WM in NAD... GENERAL:  Alert & oriented; pleasant & cooperative... HEENT:  [place]/AT, EOM-wnl, PERRLA, EACs-clear, TMs-wnl, NOSE-clear, THROAT-clear & wnl. NECK:  Supple w/ fairROM; no JVD; normal carotid impulses w/o bruits; no thyromegaly or nodules  palpated; no lymphadenopathy. CHEST:  Decr BS in bases , clear without wheezes/ rales/ or rhonchi, s/p median sternotomy. HEART:  regular rhythm; gr 2/6 sys murmur, without rubs of gallops appreciated... ABDOMEN:  Soft & nontender; normal bowel sounds; no organomegaly or masses detected. EXT: without deformities, mild arthritic changes, +ven insuffic w/ superfic VV- no c/c/edema... NEURO:  CN's intact; motor testing normal; sensory testing normal; gait normal & balance OK. No focal neuro deficit found... DERM:  no rash, no skin lesions noted...   Assessment & Plan:   R/O TIA/ Stroke>  Prelim work up is neg for acute stroke but I suspect he will need MRI & further eval by Neurology; he does not appear to have any demonstrable deficits at present but he is concerned & would like to see the Neurologist; in the interim we will rec increase the ASA to 81mg  Bid...  ?Migraine Headaches> this is the first he has ever mentioned migraine headaches to me; he describes visual symptoms which he says are migraine auras but are never followed by headache pain; he developed visual symptoms during this episode and it was followed by right-sided headache  and the other symptoms noted above; this can also be addressed by the neurologist during the consultation.   COPD> remote smoking hx, stable on AdvairBid & ProairPrn, he insists on Tussionex for cough & ZPak to keep on hand; has elev left hemidiaph, prev median sternotomy for mediastinal melanoma surg in the 1980s; prev CXRs & PFTs noted above...     HBP> controlled on Atenolol;  BP= 110/74 & NOTE: he was prev on DiovanHCT which he stopped, then restarted, ??why; told to STOP the Diovan for now...     CAD, Valv heart dis> followed by Delton See on ASA & Aten, last seen 9/11 w/ f/u due soon...     RBBB, Hx AFlutter> stable & denies CP, palpit, dizzy, ch in DOE/ edema/ etc...     VI, Edema> he knows to elim sodium, elev legs, wear support hose, etc...      Hyperchol> on Fish Oil + Diet; LDL in the 120 range, he was rec to start Crestor5mg  but he declined statin therapy & prefers diet alone...     GI> GERD, Divertics> on Prilosec 20mg  + Zantac Qhs; he wants stronger acid suppressor & we will switch to Nexium...    BPH, Elev PSA> on Flomax, Avodart & Sanctura per [employer]; continue same & they follow his PSAs...     DJD> on Etodolac prn...     Chronic persistant insomnia> on Ambien Qhs...     Hx Malig Melanoma> he has surg in the 1980's (mediastinal melanoma s/p median sternotomy) & no know recurrence; he is followed for Derm by DrDJones & had a squamous cell skin cancer removed 3/12 & several basal cells discovered 10/12 7 referred for Moh's.Marland KitchenMarland Kitchen

## 2011-05-22 NOTE — Patient Instructions (Signed)
Today we updated your med list in EPIC...    We decided to increase the ASA to 81mg  twice daily for now, & we will arrange for a Neurology evaluation...    We also decided to STOP the DiovanHCT (ValsartanHCT), and monitor your BP on Atenolol alone...    Remember to bring all your med bottles to every office visit...  We reviewed your recent lab work and CT Brain...    We decided to refer you to a Neurologist for further stroke evaluation...  Call for any questions.Marland KitchenMarland Kitchen

## 2011-06-03 ENCOUNTER — Ambulatory Visit (INDEPENDENT_AMBULATORY_CARE_PROVIDER_SITE_OTHER): Payer: Medicare Other | Admitting: Neurology

## 2011-06-03 ENCOUNTER — Encounter: Payer: Self-pay | Admitting: Neurology

## 2011-06-03 DIAGNOSIS — R269 Unspecified abnormalities of gait and mobility: Secondary | ICD-10-CM

## 2011-06-03 DIAGNOSIS — R51 Headache: Secondary | ICD-10-CM

## 2011-06-03 DIAGNOSIS — R2681 Unsteadiness on feet: Secondary | ICD-10-CM

## 2011-06-03 DIAGNOSIS — R410 Disorientation, unspecified: Secondary | ICD-10-CM

## 2011-06-03 DIAGNOSIS — H539 Unspecified visual disturbance: Secondary | ICD-10-CM

## 2011-06-03 DIAGNOSIS — F29 Unspecified psychosis not due to a substance or known physiological condition: Secondary | ICD-10-CM

## 2011-06-03 NOTE — Patient Instructions (Signed)
Your EEG is scheduled for Thursday, November 8th 11:30am at [facility], please arrive by 11:15am to First floor admitting to check in. [phone]  Your MRI and MRA's are also scheduled for Thursday, November 8th at 1:00pm in Radiology.  [phone]

## 2011-06-03 NOTE — Progress Notes (Signed)
Dear Dr. Kriste Basque,  Thank you for having me see Christian [name] in consultation today at [facility] for his problem with headache and visual changes.  As you may recall, he is a 77 y.o. year old male with a history of atrial flutter and malignant melanoma who approximately 3 weeks ago had an event of onset of right sided periocular pain that lasted 30 minutes followed by a checkerboard design that lasted several hours in  both eyes in the periphery.  He felt slightly off balance as well for a day afterwards.  He has continue to have the visual phenomenon off and on for since then typically lasting hours.  He denies any other neurologic problems.  He also has a history of what sound like ophthalmic migraine with fortification spectra typically lasting 30-40 minutes 2-3 times per month.  These had been going on 20 years.  No history of significant headaches.  You got a CT head that was unremarkable.  You recommended he increase his aspirin to 81mg  bid, although he has not done this given he has to get two squamous cell skin cancers removed soon.  He had a eye exam 2 weeks ago that reportedly did not explain the visual phenomenon.  Past Medical History  Diagnosis Date  . Allergic rhinitis   . COPD (chronic obstructive pulmonary disease)   . Disorders of diaphragm   . Acute bronchitis   . Hypertension   . CAD (coronary artery disease)   . Right bundle branch block   . Valvular heart disease   . Atrial flutter   . Venous insufficiency   . Edema   . Hypercholesteremia   . GERD (gastroesophageal reflux disease)   . Diverticulosis of colon   . Hypertrophy of prostate with urinary obstruction and other lower urinary tract symptoms (LUTS)   . Increased prostate specific antigen (PSA) velocity   . DJD (degenerative joint disease)   . Chronic insomnia   . Malignant melanoma     Past Surgical History  Procedure Date  . Bilateral inguinal hernia repair   . Umbilical hernia repair   . Median  sternotomy for mediastinal melanoma 1980s    History   Social History  . Marital Status: Married    Spouse Name: evelyn Pieratt    Number of Children: 6  . Years of Education: N/A   Occupational History  . 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 Smoking status: Former Smoker    Quit date: 08/03/1960  . Smokeless tobacco: Never Used  . Alcohol Use: Yes     social use  . Drug Use: No  . Sexually Active: None   Other Topics Concern  . None   Social History Narrative   2 biological children and 4  adopted children    FamHx: stroke in father, no history of migraine headaches.   ROS:  13 systems were reviewed and are notable for arthritic pain, balance problems and sinus problems.  All other review of systems are unremarkable.   Examination:  Filed Vitals:   06/03/11 0922  BP: 124/80  Pulse: 92  Height: 5\' 8"  (1.727 m)  Weight: 183 lb (83.008 kg)     In general, very well appearing older man.  Cardiovascular: The patient has a regular rate and rhythm and no carotid bruits.  Fundoscopy:  Disks are flat. Vessel caliber within normal limits.  White dots are noted on the retina.  Mental status:  The patient is oriented to person, place and time. Recent and remote memory are intact. Attention span and concentration are normal. Language including repetition, naming, following commands are intact. Fund of knowledge of current and historical events, as well as vocabulary are normal.  Cranial Nerves: Pupils are equally round and reactive to light. Visual fields full to confrontation. Extraocular movements are intact without nystagmus. Facial sensation and muscles of mastication are intact. Muscles of facial expression are symmetric. Hearing intact to bilateral finger rub. Tongue protrusion, uvula, palate midline.  Shoulder shrug intact  Motor:  The patient has normal bulk and tone, no pronator drift.  He has a mild postural tremor. 5/5  bilaterally.  Reflexes:   Biceps  Triceps Brachioradialis Knee Ankle  Right 1+  2+  1+   2+ 0  Left  1+  2+  1+   2+ 0  Toes down  Coordination:  Normal finger to nose.    Sensation is decreased to temperature and vibration in the feet.  Decreased temperature in the left hand in the 5th digit.  Gait and Station are normal.   Romberg is negative   Impression/Recs:  I am uncertain as to the etiology of this new event.  It sounds very atypical for an ischemic stroke given the positive visual phenomenon.  It is possible that this represents a variation in his ophthalmic migraine but it is unusual how it the visual aura continues.    I am going to proceed with a an MRI Brain and MRA head and neck.  I will also get an EEG as it is possible this represents a seizure(although unlikely).  I will see him back in about 1 week to discuss results.  He is only taking 81mg  of aspirin daily right now, but if imaging increases our suspicious this was a TIA or stroke I would advocate changing him to Plavix.   Thank you for having [place] see Christian [name] in consultation.  Feel free to contact me with any questions.  Lupita Raider Modesto Charon, MD [facility], [place] [address] [place], [place] [address] Phone: [phone] Fax: [phone].

## 2011-06-11 ENCOUNTER — Ambulatory Visit (HOSPITAL_COMMUNITY)
Admission: RE | Admit: 2011-06-11 | Discharge: 2011-06-11 | Disposition: A | Discharge status: Home / Self Care | Payer: Medicare Other | Source: Ambulatory Visit | Attending: Neurology | Admitting: Neurology

## 2011-06-11 DIAGNOSIS — F29 Unspecified psychosis not due to a substance or known physiological condition: Secondary | ICD-10-CM | POA: Insufficient documentation

## 2011-06-11 DIAGNOSIS — H539 Unspecified visual disturbance: Secondary | ICD-10-CM

## 2011-06-11 DIAGNOSIS — R2681 Unsteadiness on feet: Secondary | ICD-10-CM

## 2011-06-11 DIAGNOSIS — R410 Disorientation, unspecified: Secondary | ICD-10-CM

## 2011-06-11 DIAGNOSIS — R51 Headache: Secondary | ICD-10-CM | POA: Insufficient documentation

## 2011-06-11 DIAGNOSIS — R4182 Altered mental status, unspecified: Secondary | ICD-10-CM

## 2011-06-11 DIAGNOSIS — I771 Stricture of artery: Secondary | ICD-10-CM | POA: Insufficient documentation

## 2011-06-11 DIAGNOSIS — R269 Unspecified abnormalities of gait and mobility: Secondary | ICD-10-CM | POA: Insufficient documentation

## 2011-06-11 MED ORDER — GADOBENATE DIMEGLUMINE 529 MG/ML IV SOLN
15.0000 mL | Freq: Once | INTRAVENOUS | Status: AC
  Administered 2011-06-11: 15 mL via INTRAVENOUS

## 2011-06-12 NOTE — Procedures (Signed)
EEG NUMBER:  [id number].  This routine EEG was requested in a 77 year old man who has had an episode of stabbing pain behind his right eye with unsteady gait and blurred vision.  He has apparently had positive visual phenomena in his left eye.  MEDICATIONS:  Ambien.   The EEG was done with the patient awake, drowsy, and asleep.  During periods of maximal wakefulness he had an 8-1/2 cycle per second posterior dominant rhythm that attenuated with eye opening was symmetric.  Background activities are composed mainly of low to medium amplitude alpha and beta activities that were mildly disorganized but symmetric.  Note was made of intermittent left temporal delta and theta range slowing, and that appeared to phase reverse in the anterior to mid temporal region.  Photic stimulation did not produce a driving response.  Hyperventilation was not performed.  The patient did fall asleep as evidenced by the appearance of slower symmetric activities, vertex sharp waves, K complexes and sleep spindle.  CLINICAL INTERPRETATION:  This routine EEG done with the patient awake, drowsy, and asleep is abnormal.  The appearance of intermittent left temporal delta and theta activities suggests an underlying area of neuronal dysfunction.          ______________________________ Denton Meek, MD    [id number] D:  06/11/2011 17:13:00  T:  06/11/2011 17:32:07  Job #:  [id number]

## 2011-06-15 ENCOUNTER — Ambulatory Visit (INDEPENDENT_AMBULATORY_CARE_PROVIDER_SITE_OTHER): Payer: Medicare Other | Admitting: Neurology

## 2011-06-15 ENCOUNTER — Other Ambulatory Visit (INDEPENDENT_AMBULATORY_CARE_PROVIDER_SITE_OTHER): Payer: Medicare Other

## 2011-06-15 ENCOUNTER — Encounter: Payer: Self-pay | Admitting: Neurology

## 2011-06-15 DIAGNOSIS — R269 Unspecified abnormalities of gait and mobility: Secondary | ICD-10-CM

## 2011-06-15 DIAGNOSIS — R51 Headache: Secondary | ICD-10-CM

## 2011-06-15 DIAGNOSIS — R410 Disorientation, unspecified: Secondary | ICD-10-CM

## 2011-06-15 DIAGNOSIS — R2681 Unsteadiness on feet: Secondary | ICD-10-CM

## 2011-06-15 DIAGNOSIS — F29 Unspecified psychosis not due to a substance or known physiological condition: Secondary | ICD-10-CM

## 2011-06-15 NOTE — Patient Instructions (Signed)
Go to the basement to have your labs drawn today.  We will contact you with results.  Have a great Thanksgiving!!

## 2011-06-15 NOTE — Progress Notes (Signed)
Dear Dr. Kriste Basque,  I saw  LORNE WINKELS back in [facility] for his problem with a headache accompanied by confusion and a checkerboard visual phenomenon.  As you may recall, he is a 77 y.o. year old male with a history of atrial flutter and ophthalmic migraines who had one periocular headache lasting 30 minutes followed by a degree of confusion with a checkerboard pattern in his vision.  At the time I thought this was unlikely to be a stroke, but order a MRA of his head and neck which was not remarkable.  The MRI brain while showing a possible white matter lesion in his right occipital lobe was otherwise unremarkable(and that finding was subtle, if there at all.)  An EEG revealed left temporal delta and theta range slowing but no IEDs.  He has not had any further headaches, but intermittently, almost every day gets these checkerboard patterns in the periphery of his vision.  It does not bother him and does not get in the way of his activities.  Medical history, social history, family history, medications and allergies were reviewed and have not changed since the last clinic vist.  ROS:  13 systems were reviewed and are notable for cramping of the legs at night.  All other review of systems are unremarkable.  Exam: . Filed Vitals:   06/15/11 1045  BP: 122/70  Pulse: 84  Weight: 185 lb (83.915 kg)    In general, well appearing older man.  H&N:  good temporal pulses, no asymmetry, non-tender.  Fundoscopy: reveals multiple white spots on the retina.  CN:  VF full;    Impression/Recs:  I think it is very unlikely that the event in question was an ischemic stroke and it would be unusual for a seizure as well.  Given this is a new headache in an elderly man I am going to check his ESR, CRP and fibrinogen for TA although I think this is unlikely as well.  I have recommended he increase his aspirin to 325mg  daily which is the standard for stroke prevention.  If he continues to  have the aura and they are bothersome then we can consider a preventative for migraine such as Elavil.  We will see the patient back in 3 months.  Lupita Raider Modesto Charon, MD [facility], [place]

## 2011-06-16 LAB — SEDIMENTATION RATE: Sed Rate: 7 mm/hr (ref 0–22)

## 2011-06-16 LAB — C-REACTIVE PROTEIN: CRP: 0.22 mg/dL (ref <0.60)

## 2011-07-13 ENCOUNTER — Other Ambulatory Visit: Payer: Self-pay | Admitting: Pulmonary Disease

## 2011-07-15 ENCOUNTER — Other Ambulatory Visit: Payer: Self-pay | Admitting: Pulmonary Disease

## 2011-07-17 ENCOUNTER — Other Ambulatory Visit: Payer: Self-pay | Admitting: Pulmonary Disease

## 2011-07-31 ENCOUNTER — Encounter (HOSPITAL_COMMUNITY): Payer: Self-pay | Admitting: Emergency Medicine

## 2011-07-31 ENCOUNTER — Emergency Department (HOSPITAL_COMMUNITY)
Admission: EM | Admit: 2011-07-31 | Discharge: 2011-07-31 | Disposition: A | Discharge status: Home / Self Care | Payer: Medicare Other | Attending: Emergency Medicine | Admitting: Emergency Medicine

## 2011-07-31 DIAGNOSIS — Y92009 Unspecified place in unspecified non-institutional (private) residence as the place of occurrence of the external cause: Secondary | ICD-10-CM | POA: Insufficient documentation

## 2011-07-31 DIAGNOSIS — I2510 Atherosclerotic heart disease of native coronary artery without angina pectoris: Secondary | ICD-10-CM | POA: Insufficient documentation

## 2011-07-31 DIAGNOSIS — W19XXXA Unspecified fall, initial encounter: Secondary | ICD-10-CM

## 2011-07-31 DIAGNOSIS — J4489 Other specified chronic obstructive pulmonary disease: Secondary | ICD-10-CM | POA: Insufficient documentation

## 2011-07-31 DIAGNOSIS — IMO0002 Reserved for concepts with insufficient information to code with codable children: Secondary | ICD-10-CM

## 2011-07-31 DIAGNOSIS — J449 Chronic obstructive pulmonary disease, unspecified: Secondary | ICD-10-CM | POA: Insufficient documentation

## 2011-07-31 DIAGNOSIS — W010XXA Fall on same level from slipping, tripping and stumbling without subsequent striking against object, initial encounter: Secondary | ICD-10-CM | POA: Insufficient documentation

## 2011-07-31 DIAGNOSIS — S51009A Unspecified open wound of unspecified elbow, initial encounter: Secondary | ICD-10-CM | POA: Insufficient documentation

## 2011-07-31 DIAGNOSIS — I10 Essential (primary) hypertension: Secondary | ICD-10-CM | POA: Insufficient documentation

## 2011-07-31 MED ORDER — BACITRACIN ZINC 500 UNIT/GM EX OINT
TOPICAL_OINTMENT | CUTANEOUS | Status: AC
  Filled 2011-07-31: qty 0.9

## 2011-07-31 MED ORDER — BACITRACIN 500 UNIT/GM EX OINT
1.0000 "application " | TOPICAL_OINTMENT | Freq: Two times a day (BID) | CUTANEOUS | Status: DC
  Administered 2011-07-31: 1 via TOPICAL
  Filled 2011-07-31: qty 0.9

## 2011-07-31 MED ORDER — BACITRACIN 500 UNIT/GM EX OINT: 1.0000 "application " | TOPICAL_OINTMENT | Freq: Two times a day (BID) | CUTANEOUS | Status: DC

## 2011-07-31 MED ORDER — BACITRACIN ZINC 500 UNIT/GM EX OINT: TOPICAL_OINTMENT | Freq: Two times a day (BID) | CUTANEOUS | Status: DC

## 2011-07-31 NOTE — ED Notes (Signed)
Pt reports tripping over the christmas lights yesterday.  Landing on his L elbow and L knee. Pt presents with a skin tear on his L elbow.  Pt denies hitting his head or LOC a this time.  Pt is A&Ox 4.

## 2011-07-31 NOTE — ED Notes (Signed)
Pt tripped this am while taking down christmas lights and tripped over a strain of lights denies any pain states that he has a skin tear to lt elbow area and hit his lt knee, alert x4, bleeding controlled

## 2011-07-31 NOTE — ED Provider Notes (Signed)
History     CSN: [id number]  Arrival date & time 07/31/11  4098   First MD Initiated Contact with Patient 07/31/11 [id number]      Chief Complaint  Patient presents with  . Fall    (Consider location/radiation/quality/duration/timing/severity/associated sxs/prior treatment) HPI Comments: Tripped over christmas lights yesterday, fell , and injured the left elbow.  There is a skin tear.  No other injuries or complaints.    Patient is a 77 y.o. male presenting with fall. The history is provided by the patient.  Fall    Past Medical History  Diagnosis Date  . Allergic rhinitis   . COPD (chronic obstructive pulmonary disease)   . Disorders of diaphragm   . Acute bronchitis   . Hypertension   . CAD (coronary artery disease)   . Right bundle branch block   . Valvular heart disease   . Atrial flutter   . Venous insufficiency   . Edema   . Hypercholesteremia   . GERD (gastroesophageal reflux disease)   . Diverticulosis of colon   . Hypertrophy of prostate with urinary obstruction and other lower urinary tract symptoms (LUTS)   . Increased prostate specific antigen (PSA) velocity   . DJD (degenerative joint disease)   . Chronic insomnia   . Malignant melanoma     Past Surgical History  Procedure Date  . Bilateral inguinal hernia repair   . Umbilical hernia repair   . 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  History  Substance Use Topics  . Smoking status: Former Smoker    Quit date: 08/03/1960  . Smokeless tobacco: Never Used  . Alcohol Use: Yes     social use      Review of Systems  All other systems reviewed and are negative.    Allergies  Review of patient's allergies indicates no known allergies.  Home Medications   Current Outpatient Rx  Name Route Sig Dispense Refill  . ADVAIR DISKUS 500-50 MCG/DOSE IN AEPB  INHALE 1 PUFF TWICE A DAY 1 each 11  . ALBUTEROL SULFATE HFA 108 (90 BASE) MCG/ACT IN AERS Inhalation  Inhale 2 puffs into the lungs every 6 (six) hours as needed.      . ASPIRIN 325 MG PO TBEC Oral Take 325 mg by mouth daily.      . ATENOLOL 25 MG PO TABS  TAKE 1 TABLET BY MOUTH TWO TIMES A DAY 60 tablet 5  . AVODART 0.5 MG PO CAPS  TAKE 1 CAPSULE EVERY DAY 30 capsule 10  . CRANBERRY 500 MG PO CAPS Oral Take 1 capsule by mouth daily.      Marland Kitchen DM-GUAIFENESIN ER 30-600 MG PO TB12 Oral Take 1 tablet by mouth every 12 (twelve) hours.      Marland Kitchen ESOMEPRAZOLE MAGNESIUM 40 MG PO CPDR Oral Take 1 capsule (40 mg total) by mouth daily. 30 minutes before 1st meal of the day 30 capsule 11  . ETODOLAC 400 MG PO TB24 Oral Take 1 tablet (400 mg total) by mouth daily. 60 tablet 6    KEEP APPT FOR REFILLS  . FLUTICASONE PROPIONATE 50 MCG/ACT NA SUSP Nasal Place 2 sprays into the nose daily as needed.     . NYSTATIN 100000 UNIT/ML MT SUSP  GARGLE AND SWALLOW 1 TSP AS DIRECTED, AS NEEDED 120 mL 5  . POLYETHYLENE GLYCOL 3350 PO PACK Oral Take 17 g by mouth daily.      Marland Kitchen RANITIDINE HCL 150  MG PO CAPS Oral Take 1 capsule (150 mg total) by mouth at bedtime. 30 capsule 11  . TAMSULOSIN HCL 0.4 MG PO CAPS Oral Take 0.4 mg by mouth 2 (two) times daily.     . TROSPIUM CHLORIDE 20 MG PO TABS Oral Take 20 mg by mouth 2 (two) times daily.      Marland Kitchen ZOLPIDEM TARTRATE 10 MG PO TABS Oral Take 1 tablet (10 mg total) by mouth at bedtime as needed. 30 tablet 5  . HYDROCOD POLST-CHLORPHEN POLST 10-8 MG/5ML PO LQCR Oral Take 5 mLs by mouth every 12 (twelve) hours as needed.       There were no vitals taken for this visit.  Physical Exam  Constitutional: He is oriented to person, place, and time. He appears well-developed and well-nourished.  HENT:  Head: Normocephalic and atraumatic.  Neck: Normal range of motion. Neck supple.  Musculoskeletal:       There is a 50-cent sized skin tear to the left elbow.  There is free range of motion of this and is neurovascularly intact.    Neurological: He is alert and oriented to person, place, and  time.  Skin: Skin is warm and dry.    ED Course  Procedures (including critical care time)  Labs Reviewed - No data to display No results found.   No diagnosis found.    MDM  Flap of skin removed with scissors, dressing applied with bacitracin and ace bandage.        Geoffery Lyons, MD 07/31/11 0900

## 2011-08-05 ENCOUNTER — Telehealth: Payer: Self-pay | Admitting: Pulmonary Disease

## 2011-08-05 MED ORDER — LEVOFLOXACIN 250 MG PO TABS: 250.0000 mg | ORAL_TABLET | Freq: Every day | ORAL | Status: AC

## 2011-08-05 NOTE — Telephone Encounter (Signed)
Called and spoke with pt and he stated that he is still couging with green sputum and is requesting a refill of abx to be sent in to his pharmacy.  Pt is also aware that he can use delsym cough meds which is otc due to the rx cough meds makes him hyper.  SN please advise. thanks

## 2011-08-05 NOTE — Telephone Encounter (Signed)
Called and spoke with pts wife and she is aware of rx that has been sent to the pharmacy for the pt.

## 2011-08-07 ENCOUNTER — Telehealth: Payer: Self-pay | Admitting: Pulmonary Disease

## 2011-08-07 MED ORDER — PREDNISONE (PAK) 5 MG PO TABS: ORAL_TABLET | ORAL | Status: DC

## 2011-08-07 NOTE — Telephone Encounter (Signed)
Per SN--pt will need to finish the levaquin that was given.  Ok to have pred dosepak  5mg    6 day pack.  He will need ov next week sometime either with SN or TP.  Use the mucinex with plenty of fluids.  If worse over the weekend needs to seek medical advise.  thanks

## 2011-08-07 NOTE — Telephone Encounter (Signed)
Called and spoke with pt and he is aware of SN recs at this time.  He will finish the levaquin and is aware that the dose pak of pred has been sent to the pharmacy.  Pt voiced his understanding about following up with UC or Er over the weekend if worse and follow up next week in the office if not better.

## 2011-08-31 ENCOUNTER — Telehealth: Payer: Self-pay | Admitting: Pulmonary Disease

## 2011-08-31 NOTE — Telephone Encounter (Signed)
Called, spoke with pt. Pt was calling regarding this for his wife -- not him.  This phone message was created in wrong chart by error.

## 2011-09-04 ENCOUNTER — Encounter: Payer: Self-pay | Admitting: Cardiology

## 2011-09-04 DIAGNOSIS — I4510 Unspecified right bundle-branch block: Secondary | ICD-10-CM | POA: Insufficient documentation

## 2011-09-04 DIAGNOSIS — I4892 Unspecified atrial flutter: Secondary | ICD-10-CM | POA: Insufficient documentation

## 2011-09-04 DIAGNOSIS — I050 Rheumatic mitral stenosis: Secondary | ICD-10-CM | POA: Insufficient documentation

## 2011-09-04 DIAGNOSIS — E785 Hyperlipidemia, unspecified: Secondary | ICD-10-CM | POA: Insufficient documentation

## 2011-09-04 DIAGNOSIS — C439 Malignant melanoma of skin, unspecified: Secondary | ICD-10-CM | POA: Insufficient documentation

## 2011-09-04 DIAGNOSIS — I872 Venous insufficiency (chronic) (peripheral): Secondary | ICD-10-CM | POA: Insufficient documentation

## 2011-09-04 DIAGNOSIS — J449 Chronic obstructive pulmonary disease, unspecified: Secondary | ICD-10-CM | POA: Insufficient documentation

## 2011-09-04 DIAGNOSIS — I2510 Atherosclerotic heart disease of native coronary artery without angina pectoris: Secondary | ICD-10-CM | POA: Insufficient documentation

## 2011-09-07 ENCOUNTER — Ambulatory Visit (INDEPENDENT_AMBULATORY_CARE_PROVIDER_SITE_OTHER): Payer: Medicare Other | Admitting: Cardiology

## 2011-09-07 ENCOUNTER — Encounter: Payer: Self-pay | Admitting: Cardiology

## 2011-09-07 VITALS — BP 142/70 | HR 89 | Ht 72.0 in | Wt 184.0 lb

## 2011-09-07 DIAGNOSIS — I359 Nonrheumatic aortic valve disorder, unspecified: Secondary | ICD-10-CM

## 2011-09-07 DIAGNOSIS — I050 Rheumatic mitral stenosis: Secondary | ICD-10-CM

## 2011-09-07 DIAGNOSIS — I350 Nonrheumatic aortic (valve) stenosis: Secondary | ICD-10-CM

## 2011-09-07 DIAGNOSIS — I4510 Unspecified right bundle-branch block: Secondary | ICD-10-CM

## 2011-09-07 DIAGNOSIS — R0602 Shortness of breath: Secondary | ICD-10-CM

## 2011-09-07 DIAGNOSIS — I4892 Unspecified atrial flutter: Secondary | ICD-10-CM

## 2011-09-07 NOTE — Assessment & Plan Note (Signed)
The patient had atrial flutter in the past. We have no evidence of any significant recurrences. It is possible some of his spells could be related to burst of supraventricular tachycardia but he certainly does not feel palpitations.

## 2011-09-07 NOTE — Progress Notes (Signed)
HPI Patient is seen todayTo followup his overall cardiac status. There is a history of hypertension. There is also very mild coronary disease by catheterization in 200 . At that time he had a nuclear scan that questioned mild lateral ischemia. At the time of that study when he walked on the treadmill he had a hypotensive response to stress. The cath revealed no marked abnormalities. He was stable and further evaluation was not recommended. His ejection fraction was 60%. He did have mild aortic stenosis by cath and echo. Also there was question of very mild mitral stenosis by echo related to his mitral annular calcification. There is a history of atrial flutter in the past that was ablated with no recurrence.  The patient has not had any significant chest pain. He did have some headaches and was worked up by neurology carefully. By report MRA showed no marked abnormality.  He's not had carotid Dopplers but I am assuming that the MRA of the neck assess this area.  The patient says that he's had episodes that have occurred over several years of feeling poorly when he's working in his yard. He says that usually with exertion he can begin to feel short of breath with a mild dizzy sensation. He does not have true syncope. He rests and and this feels better. He does not have PND or orthopnea. No Known Allergies  Current Outpatient Prescriptions  Medication Sig Dispense Refill  . ADVAIR DISKUS 500-50 MCG/DOSE AEPB INHALE 1 PUFF TWICE A DAY  1 each  11  . albuterol (PROVENTIL HFA;VENTOLIN HFA) 108 (90 BASE) MCG/ACT inhaler Inhale 2 puffs into the lungs every 6 (six) hours as needed.        Marland Kitchen aspirin 325 MG EC tablet Take 325 mg by mouth daily.        Marland Kitchen atenolol (TENORMIN) 25 MG tablet TAKE 1 TABLET BY MOUTH TWO TIMES A DAY  60 tablet  5  . AVODART 0.5 MG capsule TAKE 1 CAPSULE EVERY DAY  30 capsule  10  . chlorpheniramine-HYDROcodone (TUSSIONEX PENNKINETIC ER) 10-8 MG/5ML LQCR Take 5 mLs by mouth every 12  (twelve) hours as needed.       . Cranberry (CRANBERRY CONCENTRATE) 500 MG CAPS Take 1 capsule by mouth daily.        Marland Kitchen dextromethorphan-guaiFENesin (MUCINEX DM) 30-600 MG per 12 hr tablet Take 1 tablet by mouth every 12 (twelve) hours.        Marland Kitchen esomeprazole (NEXIUM) 40 MG capsule Take 1 capsule (40 mg total) by mouth daily. 30 minutes before 1st meal of the day  30 capsule  11  . etodolac (ETODOLAC) 400 MG 24 hr tablet Take 1 tablet (400 mg total) by mouth daily.  60 tablet  6  . fluticasone (FLONASE) 50 MCG/ACT nasal spray Place 2 sprays into the nose daily as needed.       . nystatin (MYCOSTATIN) 100000 UNIT/ML suspension GARGLE AND SWALLOW 1 TSP AS DIRECTED, AS NEEDED  120 mL  5  . polyethylene glycol (MIRALAX / GLYCOLAX) packet Take 17 g by mouth daily.        . ranitidine (ZANTAC) 150 MG capsule Take 1 capsule (150 mg total) by mouth at bedtime.  30 capsule  11  . Tamsulosin HCl (FLOMAX) 0.4 MG CAPS Take 0.4 mg by mouth 2 (two) times daily.       . trospium (SANCTURA) 20 MG tablet Take 20 mg by mouth 2 (two) times daily.        Marland Kitchen  zolpidem (AMBIEN) 10 MG tablet Take 1 tablet (10 mg total) by mouth at bedtime as needed.  30 tablet  5    History   Social History  . Marital Status: Married    Spouse Name: evelyn Warbington    Number of Children: 6  . Years of Education: N/A   Occupational History  . 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 Smoking status: Former Smoker    Quit date: 08/03/1960  . Smokeless tobacco: Never Used  . Alcohol Use: Yes     social use  . Drug Use: No  . Sexually Active: Not on file   Other Topics Concern  . Not on file   Social History Narrative   2 biological children and 4  adopted children    No family history on file.  Past Medical History  Diagnosis Date  . Allergic rhinitis   . COPD (chronic obstructive pulmonary disease)   . Disorders of diaphragm   . Acute bronchitis   . Hypertension   . CAD (coronary artery  disease)     Catheterization 2009, mild coronary disease (after abnormal nuclear study,With hypotensive response to exercise, 2009)  . RBBB   . Atrial flutter     Ablated in the past, no recurrence  . Venous insufficiency   . Edema   . Dyslipidemia     Patient has an and to use statin  . GERD (gastroesophageal reflux disease)   . Diverticulosis of colon   . Hypertrophy of prostate with urinary obstruction and other lower urinary tract symptoms (LUTS)   . Increased prostate specific antigen (PSA) velocity   . DJD (degenerative joint disease)   . Chronic insomnia   . Malignant melanoma     Removed from mediastinum via thoracotomy July, 2010  . Ejection fraction     EF 60%,Echo, 2009  . Aortic stenosis     Mild, echo and catheterization, 2009  . Mitral stenosis     Very mild, echo, from mitral annular calcification    Past Surgical History  Procedure Date  . Bilateral inguinal hernia repair   . Umbilical hernia repair   . Median sternotomy for mediastinal melanoma 1980s    ROS  Patient denies fever, chills, headache, sweats, rash, change in vision, change in hearing, chest pain, cough, nausea vomiting, urinary symptoms. All other systems are reviewed and are negative.  PHYSICAL EXAM Patient is stable. He is oriented to person time and place. Affect is normal. There is no jugulovenous distention. Lungs are clear. Respiratory effort is nonlabored. Cardiac exam reveals S1 and S2. There no clicks or significant murmurs. Abdomen is soft. There is no peripheral edema. There no musculoskeletal deformities. There are no skin rashes. Filed Vitals:   09/07/11 1523  BP: 142/70  Pulse: 89  Height: 6' (1.829 m)  Weight: 184 lb (83.462 kg)    EKG EKG is done today and reviewed by me. There is sinus rhythm with first degree AV block. He also has right bundle branch block. EKG has not changed significantly from the past. ASSESSMENT & PLAN

## 2011-09-07 NOTE — Patient Instructions (Signed)
Your physician recommends that you schedule a follow-up appointment in:  4-5 weeks.   Your physician has requested that you have an echocardiogram. Echocardiography is a painless test that uses sound waves to create images of your heart. It provides your doctor with information about the size and shape of your heart and how well your heart's chambers and valves are working. This procedure takes approximately one hour. There are no restrictions for this procedure.    

## 2011-09-07 NOTE — Assessment & Plan Note (Signed)
In 2009 he appeared to have some functional mitral stenosis. It seems unlikely that this could have progressed markedly but echo will help [place] with this.

## 2011-09-07 NOTE — Assessment & Plan Note (Signed)
There was mild aortic stenosis by echo in 2009. I doubt this has progressed to severe AS. His physical exam does not seem compatible with this. Echo will provide more information.

## 2011-09-07 NOTE — Assessment & Plan Note (Signed)
Patient has bifascicular block and first degree AV block. It is possible he could have high grade conduction disease. I will consider followup event recorder.

## 2011-09-07 NOTE — Assessment & Plan Note (Signed)
Etiology of these spells is not clear. He is not having true syncope or syncope. It does not sound like classic ischemic symptoms. I will proceed with further workup. I will not start with an exercise test. In the past she became hypotensive with exercise. However he had no marked coronary disease at that time. There is a history of some valvular disease and I will followup an echo first. He had an MRA of the head and neck. This was done recently and did not show marked abnormalities.

## 2011-09-15 ENCOUNTER — Encounter: Payer: Self-pay | Admitting: Neurology

## 2011-09-15 ENCOUNTER — Ambulatory Visit (INDEPENDENT_AMBULATORY_CARE_PROVIDER_SITE_OTHER): Payer: Medicare Other | Admitting: Neurology

## 2011-09-15 VITALS — BP 118/70 | HR 80 | Ht 68.0 in | Wt 186.0 lb

## 2011-09-15 DIAGNOSIS — R51 Headache: Secondary | ICD-10-CM

## 2011-09-15 NOTE — Progress Notes (Signed)
Dear Dr. Kriste Basque,   I saw Christian [name] back in [facility] for his problem with a headache accompanied by confusion and a checkerboard visual phenomenon. As you may recall, he is a 78 y.o. year old male with a history of atrial flutter and ophthalmic migraines who had one periocular headache lasting 30 minutes followed by a degree of confusion with a checkerboard pattern in his vision.   At the time I thought this was unlikely to be a stroke, but order a MRA of his head and neck which was not remarkable. The MRI brain while showing a possible white matter lesion in his right occipital lobe was otherwise unremarkable(and that finding was subtle, if there at all.)   An EEG revealed left temporal delta and theta range slowing but no IEDs.   When I last saw him he had not had any further headaches, but intermittently, almost every day had these checkerboard patterns in the periphery of his vision. It did not bother him and did not get in the way of his activities.  Since then his checkerboard pattern in his vision has diminished.  He only had a short "twinge" in his head but no further head pains.    Medical history, social history, and family history were reviewed and have not changed since the last clinic visit.  Current Outpatient Prescriptions on File Prior to Visit  Medication Sig Dispense Refill  . ADVAIR DISKUS 500-50 MCG/DOSE AEPB INHALE 1 PUFF TWICE A DAY  1 each  11  . albuterol (PROVENTIL HFA;VENTOLIN HFA) 108 (90 BASE) MCG/ACT inhaler Inhale 2 puffs into the lungs every 6 (six) hours as needed.        Marland Kitchen aspirin 325 MG EC tablet Take 325 mg by mouth daily.        Marland Kitchen atenolol (TENORMIN) 25 MG tablet TAKE 1 TABLET BY MOUTH TWO TIMES A DAY  60 tablet  5  . AVODART 0.5 MG capsule TAKE 1 CAPSULE EVERY DAY  30 capsule  10  . chlorpheniramine-HYDROcodone (TUSSIONEX PENNKINETIC ER) 10-8 MG/5ML LQCR Take 5 mLs by mouth every 12 (twelve) hours as needed.       . Cranberry (CRANBERRY  CONCENTRATE) 500 MG CAPS Take 1 capsule by mouth daily.        Marland Kitchen dextromethorphan-guaiFENesin (MUCINEX DM) 30-600 MG per 12 hr tablet Take 1 tablet by mouth every 12 (twelve) hours.        Marland Kitchen esomeprazole (NEXIUM) 40 MG capsule Take 1 capsule (40 mg total) by mouth daily. 30 minutes before 1st meal of the day  30 capsule  11  . etodolac (ETODOLAC) 400 MG 24 hr tablet Take 1 tablet (400 mg total) by mouth daily.  60 tablet  6  . fluticasone (FLONASE) 50 MCG/ACT nasal spray Place 2 sprays into the nose daily as needed.       . nystatin (MYCOSTATIN) 100000 UNIT/ML suspension GARGLE AND SWALLOW 1 TSP AS DIRECTED, AS NEEDED  120 mL  5  . polyethylene glycol (MIRALAX / GLYCOLAX) packet Take 17 g by mouth daily.        . ranitidine (ZANTAC) 150 MG capsule Take 1 capsule (150 mg total) by mouth at bedtime.  30 capsule  11  . Tamsulosin HCl (FLOMAX) 0.4 MG CAPS Take 0.4 mg by mouth 2 (two) times daily.       . trospium (SANCTURA) 20 MG tablet Take 20 mg by mouth 2 (two) times daily.        Marland Kitchen  zolpidem (AMBIEN) 10 MG tablet Take 1 tablet (10 mg total) by mouth at bedtime as needed.  30 tablet  5    No Known Allergies  ROS:  13 systems were reviewed  and are unremarkable.  Exam: . Filed Vitals:   09/15/11 1024  BP: 118/70  Pulse: 80  Height: 5\' 8"  (1.727 m)  Weight: 186 lb (84.369 kg)    In general, well appearing older man.   Impression/Recommendations: Likely migrainous phenomenon, less likely to be ischemic.  Will continue 325mg  aspirin daily.  If he gets significant recurrence  of checkerboard pattern in his vision or head pains then a trial of a tricyclic may be in order.  I will see the patient back on a PRN basis.  Lupita Raider Modesto Charon, MD [facility], [place]

## 2011-09-22 ENCOUNTER — Ambulatory Visit (HOSPITAL_COMMUNITY): Payer: Medicare Other | Attending: Cardiology

## 2011-09-22 ENCOUNTER — Other Ambulatory Visit: Payer: Self-pay

## 2011-09-22 DIAGNOSIS — I350 Nonrheumatic aortic (valve) stenosis: Secondary | ICD-10-CM

## 2011-09-22 DIAGNOSIS — R0602 Shortness of breath: Secondary | ICD-10-CM

## 2011-09-22 DIAGNOSIS — I050 Rheumatic mitral stenosis: Secondary | ICD-10-CM

## 2011-09-22 DIAGNOSIS — I080 Rheumatic disorders of both mitral and aortic valves: Secondary | ICD-10-CM | POA: Insufficient documentation

## 2011-09-22 DIAGNOSIS — I059 Rheumatic mitral valve disease, unspecified: Secondary | ICD-10-CM

## 2011-09-22 DIAGNOSIS — I379 Nonrheumatic pulmonary valve disorder, unspecified: Secondary | ICD-10-CM | POA: Insufficient documentation

## 2011-09-22 DIAGNOSIS — I4892 Unspecified atrial flutter: Secondary | ICD-10-CM

## 2011-09-22 DIAGNOSIS — I079 Rheumatic tricuspid valve disease, unspecified: Secondary | ICD-10-CM | POA: Insufficient documentation

## 2011-09-22 DIAGNOSIS — I359 Nonrheumatic aortic valve disorder, unspecified: Secondary | ICD-10-CM

## 2011-10-09 ENCOUNTER — Encounter: Payer: Self-pay | Admitting: Cardiology

## 2011-10-09 DIAGNOSIS — I350 Nonrheumatic aortic (valve) stenosis: Secondary | ICD-10-CM | POA: Insufficient documentation

## 2011-10-09 DIAGNOSIS — R9430 Abnormal result of cardiovascular function study, unspecified: Secondary | ICD-10-CM | POA: Insufficient documentation

## 2011-10-09 DIAGNOSIS — I272 Pulmonary hypertension, unspecified: Secondary | ICD-10-CM | POA: Insufficient documentation

## 2011-10-09 DIAGNOSIS — I071 Rheumatic tricuspid insufficiency: Secondary | ICD-10-CM | POA: Insufficient documentation

## 2011-10-12 ENCOUNTER — Ambulatory Visit (INDEPENDENT_AMBULATORY_CARE_PROVIDER_SITE_OTHER): Payer: Medicare Other | Admitting: Pulmonary Disease

## 2011-10-12 ENCOUNTER — Encounter: Payer: Self-pay | Admitting: Pulmonary Disease

## 2011-10-12 DIAGNOSIS — M1990 Unspecified osteoarthritis, unspecified site: Secondary | ICD-10-CM

## 2011-10-12 DIAGNOSIS — I872 Venous insufficiency (chronic) (peripheral): Secondary | ICD-10-CM

## 2011-10-12 DIAGNOSIS — C439 Malignant melanoma of skin, unspecified: Secondary | ICD-10-CM

## 2011-10-12 DIAGNOSIS — I050 Rheumatic mitral stenosis: Secondary | ICD-10-CM

## 2011-10-12 DIAGNOSIS — N401 Enlarged prostate with lower urinary tract symptoms: Secondary | ICD-10-CM

## 2011-10-12 DIAGNOSIS — I359 Nonrheumatic aortic valve disorder, unspecified: Secondary | ICD-10-CM

## 2011-10-12 DIAGNOSIS — E785 Hyperlipidemia, unspecified: Secondary | ICD-10-CM

## 2011-10-12 DIAGNOSIS — J449 Chronic obstructive pulmonary disease, unspecified: Secondary | ICD-10-CM

## 2011-10-12 DIAGNOSIS — I10 Essential (primary) hypertension: Secondary | ICD-10-CM

## 2011-10-12 DIAGNOSIS — G4700 Insomnia, unspecified: Secondary | ICD-10-CM

## 2011-10-12 DIAGNOSIS — I2510 Atherosclerotic heart disease of native coronary artery without angina pectoris: Secondary | ICD-10-CM

## 2011-10-12 DIAGNOSIS — K219 Gastro-esophageal reflux disease without esophagitis: Secondary | ICD-10-CM

## 2011-10-12 DIAGNOSIS — I350 Nonrheumatic aortic (valve) stenosis: Secondary | ICD-10-CM

## 2011-10-12 MED ORDER — FLUTICASONE PROPIONATE 50 MCG/ACT NA SUSP: 2.0000 | Freq: Every day | NASAL | Status: DC | PRN

## 2011-10-12 MED ORDER — HYDROCODONE-HOMATROPINE 5-1.5 MG/5ML PO SYRP: 5.0000 mL | ORAL_SOLUTION | Freq: Four times a day (QID) | ORAL | Status: AC | PRN

## 2011-10-12 NOTE — Patient Instructions (Addendum)
Today we updated your med list in our EPIC system...    Continue your current medications the same...    We refilled your FLONASE per request, and changed the Hydromet cough syrup to TUSSIONEX which should work better at bedtime...  Call for any questions...  Let's plan a foloow up visit in the 4-6 month range.Marland KitchenMarland Kitchen

## 2011-10-12 NOTE — Progress Notes (Signed)
Subjective:    Patient ID: Christian [name], male    DOB: 09/17/1931, 78 y.o.   MRN: [id number]  HPI 78 y/o WM here for a follow up visit... he has mult medical problems as noted below...  Followed for general medical purposes w/ hx COPD, ex-smoker, elev right hemidiaph, CAD & mild AS followed by Delton See, Hx AFlutter s/p ablation in 2003 by DrTaylor, Hypercholesterolemia on diet alone, BPH on 3 meds per Urology, and hx MM in mediastinum 1980's (no recurrence)...  ~  April 13, 2011:  8-56mo ROV & he's noted some DOE & some intermittent heartburn; we discussed a regular exercise program & taking his PPI 30 min before a meal for max benefit; he wants oral Nystatin suspension for mouth symptoms...    COPD> remote smoking hx, stable on AdvairBid & ProairPrn, he insists on Tussionex for cough & ZPak to keep on hand; has elev left hemidiaph, prev median sternotomy for mediastinal melanoma surg in the 1980s; prev CXR & PFTs below==> f/u CXR today chr changes left base, NAD...    HBP> controlled on Atenolol & BP= 120/66 today w/o CP, palpit, ch in SOB/ edema, etc; NOTE: he was prev on DiovanHCT & he stopped this on his own $$...    CAD, Valv heart dis> followed by Delton See on ASA & Aten, last seen 9/11 w/ f/u due soon...    RBBB, Hx AFlutter> stable & denies CP, palpit, dizzy, ch in DOE/ edema/ etc...    VI, Edema> he knows to elim sodium, elev legs, wear support hose, etc...    Hyperchol> on Fish Oil + Diet; he was rec to start Crestor5mg  but he declined statin therapy & prefers diet alone...    GI> GERD, Divertics> on Prilosec 20mg  + Zantac Qhs; he wants stronger acid suppressor & we will switch to Nexium...    BPH, Elev PSA> on Flomax, Avodart & Sanctura per DrPeterson etal...    DJD> on Etodolac prn...    Chronic persistant insomnia> on Ambien Qhs...    Hx Malig Melanoma> he has surg in the 1980's (mediastinal melanoma s/p median sternotomy) & no know recurrence; he is followed for Derm by DrDJones & had  a squamous cell skin cancer removed 3/12...  ~  May 22, 2011:  6 week ROV and add on for neurologic symptoms; he states that on 05/12/11 he saw DrDJones for several skin lesions, and was given Xylocaine on his face and arm (the lesions were basal cell carcinomas); later that day he developed pain over his right eye, headache, dizziness, confusion, and irritability; he states that his vision was blurry and that he was unsteady on his feet and perhaps slightly weak in the left leg; there was no CP, palpit, slurring of speech, etc; he also noted what he calls "migraine aurora" which he says he gets from time to time but has increased in frequency lately (he denies migraine pain "I just get the aurora"); he did not go to the emergency room with these symptoms, but rather called our office for evaluation; since I could not see him for several days we decided to proceed with lab work and CT brain; the lab work was all essentially within normal limits (except the PSA of 6.11); the CT brain showed essentially within normal limits, without mass, lesions intracranial hemorrhage, or acute infarct noted; as he still has some symptoms at present we felt it was best to refer him to Neurology, and suspect that they will want to get an  MRI for further evaluation; in the meanwhile it is recommended that he increase his 81 mg aspirin from a once a day to twice a day.  ~  March 11,2013:  8mo ROV & he reports a thorough Neuro eval from DrWong: 1) MRI/ MRA of head & neck> tortuous vessels w/ diminutive left vertebral vs chr occlusion; ectasia of basilar art & both carotid siphons, no stenoses or major branch occlusions.Marland KitchenMarland Kitchen 2) EEG suggested an area of neuronal dysfunction in left temporal region w/ delta & theta activity... DrWong speculated that he might have had a migraine HA & rec incr ASA to 325mg /d; he is considering Tricyclic antidepressants if needed...    Pt also saw DrKatz 2/13 for f/u of his HBP, nonobstructive CAD, hx  AFlutter w/ prev ablation; Mild AS & MR> f/u 2DEcho showed normal LV size & function w/ EF=55-60%, normal wall motion, mildAS, modMR, atria mildly dil, moderate pulmHTN w/ PAsys=56...    He notes Urology f/u w/ DrEskridge now, on Triple therapy & voiding satis, they have decided NOT to do further PSA screening "we'll just do rectal exams from here on out" & notes last PSA was "four-something"...    He reports generally stable w/ only minor somatic complaints & states his SOB/ DOE is stable; advised exercise program; he still teaches "concealed carry" classes; he requests refill Flonase & cough syrup...          Problem List:   ALLERGIC RHINITIS (ICD-477.9) - uses OTC antihist Prn & Flonase spray...  COPD (ICD-496) & DISORDERS OF DIAPHRAGM (ICD-519.4) - he is an ex-smoker- having smoked <105yrs, and quit >8yrs ago... stable on ADVAIR 500 Prn, PROAIR Prn, MUCINEX 2Bid Prn... otherwise doing well, mows yard etc... still says he needs the TUSSIONEX vs Hydromet for cough... ~  SCANS:  9/03= elev L hemidaiph w/ scarring at base, & RLL infiltrate resolved... ~  baseline CXR 2/08 - s/p median sternotomy (mediastinal melanoma surg 1980's), elev L hemidiaph & chr changes, NAD.Marland Kitchen. ~  f/u CXR 03/13/08 = similar chr changes on the left... ~  PFT 1/06 - FVC 2.46 (54%), FEV1= 1.64 (46%), %1sec=67, mid-flows=20%. FEV1 in 1998 was 1.89. ~  PFT 8/09 showed FVC= 2.61 (58%), FEV1= 1.73 (49%), %1sec=66, mid-flows= 29%... ~  CXR 1/11 showed stable COPD, NAD.Marland Kitchen. ~  CXR 9/12 showed chr changes at the left base, NAD...  ESSENTIAL HYPERTENSION (ICD-401.9) - he's been on ATENOLOL 50mg /d, & BP noted to be mildly elevated 4/11 and started on DiovanHCT but he subsequently stopped this on his own in 2012... ~  6/11:  BP= 110/60 & similar at home... offered to ch Diovan but he prefers to continue 1/2 tab daily. ~  12/11:  BP= 122/68 & encouraged to continue both meds (Aten & DiovanHCT). ~  9/12:  BP= 120/66 on Aten alone (he  stopped the Diovan on his own)... ~  10/12:  BP= 110/74 & their is some confusion that he might have restarted the DiovanHCT, & we decided to stop this med. ~  3/13:  BP= 110/60 on Atenolol monotherapy...  CORONARY ARTERY DISEASE (ICD-414.00) - takes ATENOLOL 50mg /d, & ASA 81mg /d... followed by Delton See & his notes are reviewed. ~  EKG w/ RBBB, sinus rhythm w/ 1st degree AVB, LAD, NAD.Marland KitchenMarland Kitchen ~   min non-obstructive CAD on cath 7/03 w/ luminal irregs to 30% in Circ... ~  cath 6/09 showed 30-40% LAD, 40-50% RCA, & 20% CIRC = non-obstructive CAD  RIGHT BUNDLE BRANCH BLOCK (ICD-426.4) - baseline EKG is NSR,  RBBB +first degree AVB/ LAD.Marland KitchenMarland Kitchen  VALVULAR HEART DISEASE (ICD-424.90) & Hx of FLUTTER, ATRIAL (ICD-427.32) - s/p cath ablation in 2003 by DrTaylor... ~  2DEcho 2/06 showed conc LVH, sclerotic AoV w/o AS, low-norm LVF... ~  2DEcho 6/09 showed mod calcif AoV w/ reduced leaflet excursion c/w mild AS, mod mitral annular calcif w/ mild MS, dil LA & RV, mild incr PA sys~ 40, norm LVF w/ EF=55-60% ~  2DEcho 2/13 showed normal LV size & function w/ EF=55-60%, normal wall motion, mildAS, modMR, atria mildly dil, moderate pulmHTN w/ PAsys=56...  VENOUS INSUFFICIENCY (ICD-459.81) w/ EDEMA (ICD-782.3) - treats w/ low sodium diet;  exam shows superfic VV, no signif edema, etc...  HYPERCHOLESTEROLEMIA (ICD-272.0) - on diet + FISH OIL... ~  FLP 6/08 showed TChol 180, TG 37, HDL 52, LDL 121... he was not interested in Statin therapy. ~  FLP 4/10 showed TChol 189, TG 34, HDL 53, LDL 130... not at goal- needs meds/ he prefers diet. ~  FLP 1/11 showed TChol 195, TG 38, HDL 54, LDL 133... needs better diet. ~  FLP 12/11 showed TChol 187, TG 35, HDL 56, LDL 124... He declines low dose statin therapy. ~  FLP 10/12 showed TChol 180, TG 34, HDL 50, LDL 123  GERD (ICD-530.81) - on OMEPRAZOLE 20mg /d, and ZANTAC 150mg Qhs... ~  9/12: presents c/o incr reflux/ heartburn> wants stronger PPI & switched to NEXIUM  40mg /d...  DIVERTICULOSIS OF COLON (ICD-562.10) - notes some constipation Rx w/ MIRALAX... ~  last colonoscopy 2/05 by DrPatterson showed divertics only...   HYPERTROPHY PROSTATE W/UR OBST & OTH LUTS (ICD-600.01) - treated by DrPeterson w/ AVODART 0.5mg /d, FLOMAX 0.4mg Bid, & SANCTURA 20mg Bid...  ~  He has hx BPH, LUTS, & elev PSA on ttriple therapy per Urology... ~  Urology was following his PSAs (last 4.95 w/ 21%Free 2/13)> DrEskridge & pt decided NOT to continue the PSA screening at his age & he is happy w/ this decision.  PSA, INCREASED (ICD-790.93) - pt states that PSA was as high as 9 and improved to 5.6.Marland KitchenMarland Kitchen pt reports recent eval for hematuria- note pending...  ~  10/10: he reports his PSA 10/10 DrPeterson was 5.3 ~  6/11: he reports that his PSA recently = 5.1 ~  12/11: PSA per DrPeterson recently = 5.28 & they continue to follow... ~  10/12: PSA here= 6.11 and we will send copy of lab to the Urology team... ~  Urology was following his PSAs (last 4.95 w/ 21%Free 2/13)> DrEskridge & pt decided NOT to continue the PSA screening at his age & he is happy w/ this decision.  DEGENERATIVE JOINT DISEASE (ICD-715.90) - on Mobic but he prefers ETODOLAC 400mg  Bid Prn.  INSOMNIA, CHRONIC (ICD-307.42) - uses AMBIEN 10mg  Qhs...  MALIGNANT MELANOMA, HX OF (ICD-V10.82) - s/p surgery of mediastinal melanoma 1980's... no known recurrence... ~  3/12: he tells me that DrDJones removed a squamous cell skin cancer... ~  10/12: DrJones removed several skin cancers- one right cheek, one right forearm & referred pt for Moh's...   Past Surgical History  Procedure Date  . Bilateral inguinal hernia repair   . Umbilical hernia repair   . Median sternotomy for mediastinal melanoma 1980s    Outpatient Encounter Prescriptions as of 10/12/2011  Medication Sig Dispense Refill  . ADVAIR DISKUS 500-50 MCG/DOSE AEPB INHALE 1 PUFF TWICE A DAY  1 each  11  . albuterol (PROVENTIL HFA;VENTOLIN HFA) 108 (90 BASE)  MCG/ACT inhaler Inhale 2 puffs into the lungs every  6 (six) hours as needed.        Marland Kitchen aspirin 325 MG EC tablet Take 325 mg by mouth daily.        Marland Kitchen atenolol (TENORMIN) 25 MG tablet TAKE 1 TABLET BY MOUTH TWO TIMES A DAY  60 tablet  5  . AVODART 0.5 MG capsule TAKE 1 CAPSULE EVERY DAY  30 capsule  10  . Cranberry (CRANBERRY CONCENTRATE) 500 MG CAPS Take 1 capsule by mouth daily.        Marland Kitchen dextromethorphan-guaiFENesin (MUCINEX DM) 30-600 MG per 12 hr tablet Take 1 tablet by mouth every 12 (twelve) hours.        Marland Kitchen esomeprazole (NEXIUM) 40 MG capsule Take 1 capsule (40 mg total) by mouth daily. 30 minutes before 1st meal of the day  30 capsule  11  . etodolac (ETODOLAC) 400 MG 24 hr tablet Take 1 tablet (400 mg total) by mouth daily.  60 tablet  6  . fluticasone (FLONASE) 50 MCG/ACT nasal spray Place 2 sprays into the nose daily as needed.       . nystatin (MYCOSTATIN) 100000 UNIT/ML suspension GARGLE AND SWALLOW 1 TSP AS DIRECTED, AS NEEDED  120 mL  5  . polyethylene glycol (MIRALAX / GLYCOLAX) packet Take 17 g by mouth daily.        . ranitidine (ZANTAC) 150 MG capsule Take 1 capsule (150 mg total) by mouth at bedtime.  30 capsule  11  . Tamsulosin HCl (FLOMAX) 0.4 MG CAPS Take 0.4 mg by mouth 2 (two) times daily.       . trospium (SANCTURA) 20 MG tablet Take 20 mg by mouth 2 (two) times daily.        Marland Kitchen zolpidem (AMBIEN) 10 MG tablet Take 1 tablet (10 mg total) by mouth at bedtime as needed.  30 tablet  5  . chlorpheniramine-HYDROcodone (TUSSIONEX PENNKINETIC ER) 10-8 MG/5ML LQCR Take 5 mLs by mouth every 12 (twelve) hours as needed.         No Known Allergies   Current Medications, Allergies, Past Medical History, Past Surgical History, Family History, and Social History were reviewed in [employer] record.    Review of Systems         See HPI - all other systems neg except as noted...  The patient notes mult somatic complaints including visual symptoms, headache, sl  dizzy, intermittent confusion, some weakness, sl gait abnormality, etc.  The patient denies anorexia, fever, weight loss, weight gain, vision loss, decreased hearing, hoarseness, chest pain, syncope, peripheral edema, prolonged cough, hemoptysis, abdominal pain, melena, hematochezia, severe indigestion/heartburn, hematuria, incontinence, transient blindness, depression, unusual weight change, abnormal bleeding, enlarged lymph nodes, and angioedema.   Objective:   Physical Exam     WD, WN, 78 y/o WM in NAD... GENERAL:  Alert & oriented; pleasant & cooperative... HEENT:  [place]/AT, EOM-wnl, PERRLA, EACs-clear, TMs-wnl, NOSE-clear, THROAT-clear & wnl. NECK:  Supple w/ fairROM; no JVD; normal carotid impulses w/o bruits; no thyromegaly or nodules palpated; no lymphadenopathy. CHEST:  Decr BS in bases , clear without wheezes/ rales/ or rhonchi, s/p median sternotomy. HEART:  regular rhythm; gr 2/6 sys murmur, without rubs of gallops appreciated... ABDOMEN:  Soft & nontender; normal bowel sounds; no organomegaly or masses detected. EXT: without deformities, mild arthritic changes, +ven insuffic w/ superfic VV- no c/c/edema... NEURO:  CN's intact; motor testing normal; sensory testing normal; gait normal & balance OK. No focal neuro deficit found... DERM:  no rash, no skin  lesions noted...  RADIOLOGY DATA:  Reviewed in the EPIC EMR & discussed w/ the patient...  LABORATORY DATA:  Reviewed in the EPIC EMR & discussed w/ the patient...   Assessment & Plan:   Neuro eval by DrWong>> ?Migraine Headaches> this is the first he has ever mentioned migraine headaches to me; he describes visual symptoms which he says are migraine auras but are never followed by headache pain; he developed visual symptoms during this episode and it was followed by right-sided headache and the other symptoms noted above; this was addressed by the neurologist during the consultation; pt on ASA daily & will f/u w/ DrWong.   COPD>  remote smoking hx, stable on AdvairBid & ProairPrn, he insists on Tussionex vs Hydromet for cough & ZPak to keep on hand; has elev left hemidiaph, prev median sternotomy for mediastinal melanoma surg in the 1980s; prev CXRs & PFTs noted above...     HBP> controlled on Atenolol;  BP= 110/74 & NOTE: he was prev on DiovanHCT which he stopped, then restarted, ??why; told to STOP the Diovan for now...     CAD, Valv heart dis> followed by Delton See on ASA & Aten, last seen 9/11 w/ f/u due soon...     RBBB, Hx AFlutter> stable & denies CP, palpit, dizzy, ch in DOE/ edema/ etc...     VI, Edema> he knows to elim sodium, elev legs, wear support hose, etc...     Hyperchol> on Fish Oil + Diet; LDL in the 120 range, he was rec to start Crestor5mg  but he declined statin therapy & prefers diet alone...     GI> GERD, Divertics> on Prilosec 20mg  + Zantac Qhs; he wants stronger acid suppressor & we will switch to Nexium...    BPH, Elev PSA> on Flomax, Avodart & Sanctura per DrEskridge Antony Haste; continue same & they have decided to stop PSA testing...     DJD> on Etodolac prn...     Chronic persistant insomnia> on Ambien Qhs...     Hx Malig Melanoma> he has surg in the 1980's (mediastinal melanoma s/p median sternotomy) & no know recurrence; he is followed for Derm by DrDJones & had a squamous cell skin cancer removed 3/12 & several basal cells discovered 10/12 7 referred for Moh's...    Patient's Medications  New Prescriptions   HYDROCODONE-HOMATROPINE (HYDROMET) 5-1.5 MG/5ML SYRUP    Take 5 mLs by mouth every 6 (six) hours as needed for cough.  Previous Medications   ADVAIR DISKUS 500-50 MCG/DOSE AEPB    INHALE 1 PUFF TWICE A DAY   ALBUTEROL (PROVENTIL HFA;VENTOLIN HFA) 108 (90 BASE) MCG/ACT INHALER    Inhale 2 puffs into the lungs every 6 (six) hours as needed.     ASPIRIN 325 MG EC TABLET    Take 325 mg by mouth daily.     ATENOLOL (TENORMIN) 25 MG TABLET    TAKE 1 TABLET BY MOUTH TWO TIMES A DAY   AVODART  0.5 MG CAPSULE    TAKE 1 CAPSULE EVERY DAY   CRANBERRY (CRANBERRY CONCENTRATE) 500 MG CAPS    Take 1 capsule by mouth daily.     DEXTROMETHORPHAN-GUAIFENESIN (MUCINEX DM) 30-600 MG PER 12 HR TABLET    Take 1 tablet by mouth every 12 (twelve) hours.     ESOMEPRAZOLE (NEXIUM) 40 MG CAPSULE    Take 1 capsule (40 mg total) by mouth daily. 30 minutes before 1st meal of the day   ETODOLAC (ETODOLAC) 400 MG 24 HR TABLET  Take 1 tablet (400 mg total) by mouth daily.   NYSTATIN (MYCOSTATIN) 100000 UNIT/ML SUSPENSION    GARGLE AND SWALLOW 1 TSP AS DIRECTED, AS NEEDED   POLYETHYLENE GLYCOL (MIRALAX / GLYCOLAX) PACKET    Take 17 g by mouth daily.     RANITIDINE (ZANTAC) 150 MG CAPSULE    Take 1 capsule (150 mg total) by mouth at bedtime.   TAMSULOSIN HCL (FLOMAX) 0.4 MG CAPS    Take 0.4 mg by mouth 2 (two) times daily.    TROSPIUM (SANCTURA) 20 MG TABLET    Take 20 mg by mouth 2 (two) times daily.     ZOLPIDEM (AMBIEN) 10 MG TABLET    Take 1 tablet (10 mg total) by mouth at bedtime as needed.  Modified Medications   Modified Medication Previous Medication   FLUTICASONE (FLONASE) 50 MCG/ACT NASAL SPRAY fluticasone (FLONASE) 50 MCG/ACT nasal spray      Place 2 sprays into the nose daily as needed.    Place 2 sprays into the nose daily as needed.   Discontinued Medications   CHLORPHENIRAMINE-HYDROCODONE (TUSSIONEX PENNKINETIC ER) 10-8 MG/5ML LQCR    Take 5 mLs by mouth every 12 (twelve) hours as needed.

## 2011-10-13 ENCOUNTER — Ambulatory Visit: Payer: Medicare Other | Admitting: Cardiology

## 2011-10-15 ENCOUNTER — Telehealth: Payer: Self-pay | Admitting: Pulmonary Disease

## 2011-10-15 ENCOUNTER — Other Ambulatory Visit: Payer: Self-pay | Admitting: Cardiology

## 2011-10-15 NOTE — Telephone Encounter (Signed)
Error.Christian [name] ° °

## 2011-10-16 ENCOUNTER — Ambulatory Visit: Payer: Medicare Other | Admitting: Cardiology

## 2011-10-21 ENCOUNTER — Other Ambulatory Visit: Payer: Self-pay | Admitting: Pulmonary Disease

## 2011-11-02 ENCOUNTER — Encounter: Payer: Self-pay | Admitting: Cardiology

## 2011-11-03 ENCOUNTER — Ambulatory Visit (INDEPENDENT_AMBULATORY_CARE_PROVIDER_SITE_OTHER): Payer: Medicare Other | Admitting: Cardiology

## 2011-11-03 ENCOUNTER — Encounter: Payer: Self-pay | Admitting: Cardiology

## 2011-11-03 VITALS — BP 134/78 | HR 80 | Ht 72.0 in | Wt 187.0 lb

## 2011-11-03 DIAGNOSIS — I4892 Unspecified atrial flutter: Secondary | ICD-10-CM

## 2011-11-03 DIAGNOSIS — R0602 Shortness of breath: Secondary | ICD-10-CM

## 2011-11-03 DIAGNOSIS — I2510 Atherosclerotic heart disease of native coronary artery without angina pectoris: Secondary | ICD-10-CM

## 2011-11-03 DIAGNOSIS — I050 Rheumatic mitral stenosis: Secondary | ICD-10-CM

## 2011-11-03 DIAGNOSIS — I272 Pulmonary hypertension, unspecified: Secondary | ICD-10-CM

## 2011-11-03 DIAGNOSIS — I2789 Other specified pulmonary heart diseases: Secondary | ICD-10-CM

## 2011-11-03 NOTE — Patient Instructions (Signed)
Your physician wants you to follow-up in:  6 months. You will receive a reminder letter in the mail two months in advance. If you don't receive a letter, please call our office to schedule the follow-up appointment.   

## 2011-11-03 NOTE — Assessment & Plan Note (Signed)
His pulmonary hypertension is a combination of his lung disease and valvular disease. No change in therapy.

## 2011-11-03 NOTE — Assessment & Plan Note (Signed)
I believe that his shortness of breath is multifactorial. He does have lung disease. He has mild aortic stenosis and mild-to-moderate mitral stenosis. I have considered whether we should increase the beta blocker to slow his heart rate further. However he does have some reactive airway disease. He is on a small dose of a beta blocker. I've chosen not to change his medicine.

## 2011-11-03 NOTE — Progress Notes (Signed)
HPI Patient returns for followup shortness of breath.I saw him last in the office September 07, 2011. At that time decision was made to do a followup echo. This was done in September 22, 2011. Ejection fraction is 55-60%. There is mild aortic stenosis. He does have mitral stenosis. The gradient data suggests that it may be moderate. Evaluation by pressure half-time suggests mild. I've explained this completed the patient and this will be followed over time. He does have mild-to-moderate pulmonary hypertension.  No Known Allergies  Current Outpatient Prescriptions  Medication Sig Dispense Refill  . ADVAIR DISKUS 500-50 MCG/DOSE AEPB INHALE 1 PUFF TWICE A DAY  1 each  11  . albuterol (PROVENTIL HFA;VENTOLIN HFA) 108 (90 BASE) MCG/ACT inhaler Inhale 2 puffs into the lungs every 6 (six) hours as needed.        Marland Kitchen aspirin 325 MG EC tablet Take 325 mg by mouth daily.        Marland Kitchen atenolol (TENORMIN) 25 MG tablet TAKE 1 TABLET BY MOUTH TWO TIMES A DAY  60 tablet  5  . AVODART 0.5 MG capsule TAKE 1 CAPSULE EVERY DAY  30 capsule  10  . Cranberry (CRANBERRY CONCENTRATE) 500 MG CAPS Take 1 capsule by mouth daily.        Marland Kitchen dextromethorphan-guaiFENesin (MUCINEX DM) 30-600 MG per 12 hr tablet Take 1 tablet by mouth every 12 (twelve) hours.        Marland Kitchen esomeprazole (NEXIUM) 40 MG capsule Take 1 capsule (40 mg total) by mouth daily. 30 minutes before 1st meal of the day  30 capsule  11  . etodolac (ETODOLAC) 400 MG 24 hr tablet Take 1 tablet (400 mg total) by mouth daily.  60 tablet  6  . fluticasone (FLONASE) 50 MCG/ACT nasal spray Place 2 sprays into the nose daily as needed.  16 g  6  . nystatin (MYCOSTATIN) 100000 UNIT/ML suspension GARGLE AND SWALLOW 1 TSP AS DIRECTED, AS NEEDED  120 mL  5  . polyethylene glycol (MIRALAX / GLYCOLAX) packet Take 17 g by mouth daily.        . ranitidine (ZANTAC) 150 MG capsule Take 1 capsule (150 mg total) by mouth at bedtime.  30 capsule  11  . Tamsulosin HCl (FLOMAX) 0.4 MG CAPS  Take 0.4 mg by mouth 2 (two) times daily.       . trospium (SANCTURA) 20 MG tablet Take 20 mg by mouth 2 (two) times daily.        Marland Kitchen zolpidem (AMBIEN) 10 MG tablet Take 1 tablet (10 mg total) by mouth at bedtime as needed.  30 tablet  5  . zolpidem (AMBIEN) 10 MG tablet Take 1 tablet (10 mg total) by mouth at bedtime as needed for sleep.  30 tablet  5    History   Social History  . Marital Status: Married    Spouse Name: evelyn Guimond    Number of Children: 6  . Years of Education: N/A   Occupational History  . 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 Smoking status: Former Smoker    Quit date: 08/03/1960  . Smokeless tobacco: Never Used  . Alcohol Use: Yes     social use  . Drug Use: No  . Sexually Active: Not on file   Other Topics Concern  . Not on file   Social History Narrative   2 biological children and 4  adopted children    No family  history on file.  Past Medical History  Diagnosis Date  . Allergic rhinitis   . COPD (chronic obstructive pulmonary disease)   . Disorders of diaphragm   . Acute bronchitis   . Hypertension   . CAD (coronary artery disease)     Catheterization 2009, mild coronary disease (after abnormal nuclear study,With hypotensive response to exercise, 2009)  . RBBB   . Atrial flutter     Ablated in the past, no recurrence  . Venous insufficiency   . Edema   . Dyslipidemia     Patient has an and to use statin  . GERD (gastroesophageal reflux disease)   . Diverticulosis of colon   . Hypertrophy of prostate with urinary obstruction and other lower urinary tract symptoms (LUTS)   . Increased prostate specific antigen (PSA) velocity   . DJD (degenerative joint disease)   . Chronic insomnia   . Malignant melanoma     Removed from mediastinum via thoracotomy July, 2010  . Ejection fraction     EF 60%,Echo, 2009,  /  EF 55-60%, echo, February, 2013  . Aortic stenosis     Mild, echo and catheterization, 2009 /  Mild,  echo, February, 2013  . Mitral stenosis     Very mild, echo, from mitral annular calcification  . Shortness of breath     Episodes of feeling shortness of breath with some mild dizziness with mild exertion., February, 2013  . Tricuspid regurgitation     Moderate, echo, February, 2013, 56 mmHg  . Pulmonary hypertension     56 mmHg, echo, February, 2013    Past Surgical History  Procedure Date  . Bilateral inguinal hernia repair   . Umbilical hernia repair   . Median sternotomy for mediastinal melanoma 1980s    ROS  Patient denies fever, chills, headache, sweats, rash, change in vision, change in hearing, chest pain, cough, nausea vomiting, urinary symptoms. All other systems are reviewed and are negative.  PHYSICAL EXAM Patient stable. Head is atraumatic. There is no jugular venous distention. Lungs are clear. Respiratory effort is nonlabored. Cardiac exam reveals S1 and S2. There is a 2/6 systolic murmur. The abdomen is soft. Is no peripheral edema.  Filed Vitals:   11/03/11 0940  BP: 134/78  Pulse: 80  Height: 6' (1.829 m)  Weight: 187 lb (84.823 kg)    ASSESSMENT & PLAN

## 2011-11-03 NOTE — Assessment & Plan Note (Signed)
The echo data does suggest mild to moderate mitral stenosis. This will be followed over time. I explained this to him.

## 2011-11-03 NOTE — Assessment & Plan Note (Signed)
He's not having any significant palpitations. No further workup. 

## 2011-11-03 NOTE — Assessment & Plan Note (Signed)
Coronary disease is stable. I've chosen not to pursue any further workup at this time.

## 2011-12-02 ENCOUNTER — Telehealth: Payer: Self-pay | Admitting: Pulmonary Disease

## 2011-12-03 NOTE — Telephone Encounter (Signed)
Called and spoke with Christian [name], pts son and he is aware that rx and form has been completed by SN and is ready to be picked up.

## 2011-12-15 ENCOUNTER — Telehealth: Payer: Self-pay | Admitting: Pulmonary Disease

## 2011-12-15 MED ORDER — ZOLPIDEM TARTRATE 10 MG PO TABS: 10.0000 mg | ORAL_TABLET | Freq: Every evening | ORAL | Status: DC | PRN

## 2011-12-15 NOTE — Telephone Encounter (Signed)
rx has been called to the pharmacy for the pt.

## 2011-12-15 NOTE — Telephone Encounter (Signed)
Pt is requesting a refill on ambien 19 mg. Last OV 10/12/11 pending OV 02/11/12. Last refilled 05/15/11 #30 x 5 refills. Please advise SN thanks

## 2011-12-29 ENCOUNTER — Other Ambulatory Visit: Payer: Self-pay | Admitting: Dermatology

## 2012-01-19 ENCOUNTER — Other Ambulatory Visit: Payer: Self-pay | Admitting: Pulmonary Disease

## 2012-01-19 ENCOUNTER — Telehealth: Payer: Self-pay | Admitting: Neurology

## 2012-01-19 NOTE — Telephone Encounter (Signed)
Pt would like to speak to Dr. Modesto Charon about "the problem he came in for previously."

## 2012-01-19 NOTE — Telephone Encounter (Signed)
Returned call to pt and his wife answered, pt was not there.  She said he was still having a 'touch' of headache and wanted a follow up appt.  Made appt for next available on 8/1.  She will have him contact us if that is not a good time or to see if someone cancels.

## 2012-02-11 ENCOUNTER — Ambulatory Visit (INDEPENDENT_AMBULATORY_CARE_PROVIDER_SITE_OTHER): Payer: Medicare Other | Admitting: Pulmonary Disease

## 2012-02-11 ENCOUNTER — Encounter: Payer: Self-pay | Admitting: Pulmonary Disease

## 2012-02-11 VITALS — BP 126/70 | HR 79 | Temp 96.8°F | Ht 72.0 in | Wt 190.0 lb

## 2012-02-11 DIAGNOSIS — I050 Rheumatic mitral stenosis: Secondary | ICD-10-CM

## 2012-02-11 DIAGNOSIS — E785 Hyperlipidemia, unspecified: Secondary | ICD-10-CM

## 2012-02-11 DIAGNOSIS — C439 Malignant melanoma of skin, unspecified: Secondary | ICD-10-CM

## 2012-02-11 DIAGNOSIS — I4510 Unspecified right bundle-branch block: Secondary | ICD-10-CM

## 2012-02-11 DIAGNOSIS — N401 Enlarged prostate with lower urinary tract symptoms: Secondary | ICD-10-CM

## 2012-02-11 DIAGNOSIS — M1990 Unspecified osteoarthritis, unspecified site: Secondary | ICD-10-CM

## 2012-02-11 DIAGNOSIS — J986 Disorders of diaphragm: Secondary | ICD-10-CM

## 2012-02-11 DIAGNOSIS — I2510 Atherosclerotic heart disease of native coronary artery without angina pectoris: Secondary | ICD-10-CM

## 2012-02-11 DIAGNOSIS — K219 Gastro-esophageal reflux disease without esophagitis: Secondary | ICD-10-CM

## 2012-02-11 DIAGNOSIS — R609 Edema, unspecified: Secondary | ICD-10-CM

## 2012-02-11 DIAGNOSIS — G4700 Insomnia, unspecified: Secondary | ICD-10-CM

## 2012-02-11 DIAGNOSIS — I872 Venous insufficiency (chronic) (peripheral): Secondary | ICD-10-CM

## 2012-02-11 DIAGNOSIS — K5730 Diverticulosis of large intestine without perforation or abscess without bleeding: Secondary | ICD-10-CM

## 2012-02-11 DIAGNOSIS — J449 Chronic obstructive pulmonary disease, unspecified: Secondary | ICD-10-CM

## 2012-02-11 DIAGNOSIS — I10 Essential (primary) hypertension: Secondary | ICD-10-CM

## 2012-02-11 DIAGNOSIS — K5900 Constipation, unspecified: Secondary | ICD-10-CM

## 2012-02-11 MED ORDER — NYSTATIN 100000 UNIT/ML MT SUSP: OROMUCOSAL | Status: DC

## 2012-02-11 NOTE — Patient Instructions (Addendum)
Today we updated your med list in our EPIC system...    Continue your current medications the same...  We reviewed your current med regimen today...  Call for any problems...   Let's plan a follow up visit in 6 months w/ Fasting blood work & CXR at that time.Marland KitchenMarland Kitchen

## 2012-02-11 NOTE — Progress Notes (Signed)
Subjective:    Patient ID: Christian [name], male    DOB: 09/17/1931, 78 y.o.   MRN: [id number]  HPI 78 y/o WM here for a follow up visit... he has mult medical problems as noted below...  Followed for general medical purposes w/ hx COPD, ex-smoker, elev right hemidiaph, CAD & mild AS followed by Christian [name], Hx AFlutter s/p ablation in 2003 by Christian [name], Hypercholesterolemia on diet alone, BPH on 3 meds per Urology, and hx MM in mediastinum 1980's (no recurrence)...  ~  April 13, 2011:  8-56mo ROV & he's noted some DOE & some intermittent heartburn; we discussed a regular exercise program & taking his PPI 30 min before a meal for max benefit; he wants oral Nystatin suspension for mouth symptoms...    COPD> remote smoking hx, stable on AdvairBid & ProairPrn, he insists on Tussionex for cough & ZPak to keep on hand; has elev left hemidiaph, prev median sternotomy for mediastinal melanoma surg in the 1980s; prev CXR & PFTs below==> f/u CXR today chr changes left base, NAD...    HBP> controlled on Atenolol & BP= 120/66 today w/o CP, palpit, ch in SOB/ edema, etc; NOTE: he was prev on DiovanHCT & he stopped this on his own $$...    CAD, Valv heart dis> followed by Christian [name] on ASA & Aten, last seen 9/11 w/ f/u due soon...    RBBB, Hx AFlutter> stable & denies CP, palpit, dizzy, ch in DOE/ edema/ etc...    VI, Edema> he knows to elim sodium, elev legs, wear support hose, etc...    Hyperchol> on Fish Oil + Diet; he was rec to start Crestor5mg  but he declined statin therapy & prefers diet alone...    GI> GERD, Divertics> on Prilosec 20mg  + Zantac Qhs; he wants stronger acid suppressor & we will switch to Nexium...    BPH, Elev PSA> on Flomax, Avodart & Sanctura per Christian [name] etal...    DJD> on Etodolac prn...    Chronic persistant insomnia> on Ambien Qhs...    Hx Malig Melanoma> he has surg in the 1980's (mediastinal melanoma s/p median sternotomy) & no know recurrence; he is followed for Derm by Christian [name] & had  a squamous cell skin cancer removed 3/12...  ~  May 22, 2011:  6 week ROV and add on for neurologic symptoms; he states that on 05/12/11 he saw Christian [name] for several skin lesions, and was given Xylocaine on his face and arm (the lesions were basal cell carcinomas); later that day he developed pain over his right eye, headache, dizziness, confusion, and irritability; he states that his vision was blurry and that he was unsteady on his feet and perhaps slightly weak in the left leg; there was no CP, palpit, slurring of speech, etc; he also noted what he calls "migraine aurora" which he says he gets from time to time but has increased in frequency lately (he denies migraine pain "I just get the aurora"); he did not go to the emergency room with these symptoms, but rather called our office for evaluation; since I could not [name] him for several days we decided to proceed with lab work and CT brain; the lab work was all essentially within normal limits (except the PSA of 6.11); the CT brain showed essentially within normal limits, without mass, lesions intracranial hemorrhage, or acute infarct noted; as he still has some symptoms at present we felt it was best to refer him to Neurology, and suspect that they will want to get an  MRI for further evaluation; in the meanwhile it is recommended that he increase his 81 mg aspirin from a once a day to twice a day.  ~  October 12, 2011:  683mo ROV & he reports a thorough Neuro eval from Christian [name]: 1) MRI/ MRA of head & neck> tortuous vessels w/ diminutive left vertebral vs chr occlusion; ectasia of basilar art & both carotid siphons, no stenoses or major branch occlusions.Marland KitchenMarland Kitchen 2) EEG suggested an area of neuronal dysfunction in left temporal region w/ delta & theta activity... Christian [name] speculated that he might have had a migraine HA & rec incr ASA to 325mg /d; he is considering Tricyclic antidepressants if needed...    Pt also saw Christian [name] 2/13 for f/u of his HBP, nonobstructive CAD, hx  AFlutter w/ prev ablation, Mild AS & MR> f/u 2DEcho showed normal LV size & function w/ EF=55-60%, normal wall motion, mildAS, modMR, atria mildly dil, moderate pulmHTN w/ PAsys=56...    He notes Urology f/u w/ Christian [name] now, on Triple therapy & voiding satis, they have decided NOT to do further PSA screening "we'll just do rectal exams from here on out" & notes last PSA was "four-something"...    He reports generally stable w/ only minor somatic complaints & states his SOB/ DOE is stable; advised exercise program; he still teaches "concealed carry" classes; he requests refill Flonase & cough syrup...  ~  February 11, 2012:  83mo ROV & [name] requested a thorough review of his meds to [name] if there is anything that can be stopped; we reviewed all his meds one at a time & I noted that he has several Prn meds (ProairHFA, Mucinex, Flonase, MMW, Ambien) and several supplements meds he chooses to take (Cranberry capsules, Glucosamine)...     He saw Christian [name] for Cards f/u 4/13> they reviewed 2DEcho 2/13 that showed mild to mod AS & MS, norm LVF w/ EF= 55-60%, & mild to mod PulmHTN w/ PAsys=56; no change in meds, etc...    We reviewed prob list, meds, xrays and labs> [name] below>>          Problem List:   ALLERGIC RHINITIS (ICD-477.9) - uses OTC antihist Prn & Flonase spray...  COPD (ICD-496) & DISORDERS OF DIAPHRAGM (ICD-519.4) - he is an ex-smoker- having smoked <18yrs, and quit >33yrs ago... stable on ADVAIR 500 Prn, PROAIR Prn, MUCINEX 2Bid Prn... otherwise doing well, mows yard etc... still says he needs the TUSSIONEX vs Hydromet for cough... ~  SCANS:  9/03= elev L hemidaiph w/ scarring at base, & RLL infiltrate resolved... ~  baseline CXR 2/08 - s/p median sternotomy (mediastinal melanoma surg 1980's), elev L hemidiaph & chr changes, NAD.Marland Kitchen. ~  f/u CXR 03/13/08 = similar chr changes on the left... ~  PFT 1/06 - FVC 2.46 (54%), FEV1= 1.64 (46%), %1sec=67, mid-flows=20%. FEV1 in 1998 was 1.89. ~  PFT 8/09  showed FVC= 2.61 (58%), FEV1= 1.73 (49%), %1sec=66, mid-flows= 29%... ~  CXR 1/11 showed stable COPD, NAD.Marland Kitchen. ~  CXR 9/12 showed chr changes at the left base, NAD...  ESSENTIAL HYPERTENSION (ICD-401.9) - he's been on ATENOLOL 25mg Bid, & BP noted to be mildly elevated 4/11 and started on DiovanHCT but he subsequently stopped this on his own in 2012... ~  6/11:  BP= 110/60 & similar at home... offered to ch Diovan but he prefers to continue 1/2 tab daily. ~  12/11:  BP= 122/68 & encouraged to continue both meds (Aten & DiovanHCT). ~  9/12:  BP= 120/66 on Aten alone (  he stopped the Diovan on his own)... ~  10/12:  BP= 110/74 & their is some confusion that he might have restarted the DiovanHCT, & we decided to stop this med. ~  3/13:  BP= 110/60 on Atenolol monotherapy... ~  7/13:  BP= 126/70 & he denies CP, palpit, dizzy, ch in SOB, or edema...  CORONARY ARTERY DISEASE (ICD-414.00) - takes ATENOLOL 25mg Bid, & ASA 325mg /d... followed by Christian [name] & his notes are reviewed. ~  EKG w/ RBBB, sinus rhythm w/ 1st degree AVB, LAD, NAD.Marland KitchenMarland Kitchen ~   min non-obstructive CAD on cath 7/03 w/ luminal irregs to 30% in Circ... ~  cath 6/09 showed 30-40% LAD, 40-50% RCA, & 20% CIRC = non-obstructive CAD  RIGHT BUNDLE BRANCH BLOCK (ICD-426.4) - baseline EKG is NSR, RBBB +first degree AVB/ LAD.Marland KitchenMarland Kitchen  VALVULAR HEART DISEASE (ICD-424.90) & Hx of FLUTTER, ATRIAL (ICD-427.32) - s/p cath ablation in 2003 by Christian [name]... ~  2DEcho 2/06 showed conc LVH, sclerotic AoV w/o AS, low-norm LVF... ~  2DEcho 6/09 showed mod calcif AoV w/ reduced leaflet excursion c/w mild AS, mod mitral annular calcif w/ mild MS, dil LA & RV, mild incr PA sys~ 40, norm LVF w/ EF=55-60% ~  2DEcho 2/13 showed normal LV size & function w/ EF=55-60%, normal wall motion, mildAS, modMR, atria mildly dil, moderate pulmHTN w/ PAsys=56...  VENOUS INSUFFICIENCY (ICD-459.81) w/ EDEMA (ICD-782.3) - treats w/ low sodium diet;  exam shows superfic VV, no signif edema,  etc...  HYPERCHOLESTEROLEMIA (ICD-272.0) - on diet + FISH OIL... ~  FLP 6/08 showed TChol 180, TG 37, HDL 52, LDL 121... he was not interested in Statin therapy. ~  FLP 4/10 showed TChol 189, TG 34, HDL 53, LDL 130... not at goal- needs meds/ he prefers diet. ~  FLP 1/11 showed TChol 195, TG 38, HDL 54, LDL 133... needs better diet. ~  FLP 12/11 showed TChol 187, TG 35, HDL 56, LDL 124... He declines low dose statin therapy. ~  FLP 10/12 showed TChol 180, TG 34, HDL 50, LDL 123  GERD (ICD-530.81) - on OMEPRAZOLE 20mg /d, and ZANTAC 150mg Qhs... ~  9/12: presents c/o incr reflux/ heartburn> wants stronger PPI & switched to NEXIUM 40mg /d...  DIVERTICULOSIS OF COLON (ICD-562.10) - notes some constipation Rx w/ MIRALAX (really helps!).Marland Kitchen. ~  last colonoscopy 2/05 by DrPatterson showed divertics only...   HYPERTROPHY PROSTATE W/UR OBST & OTH LUTS (ICD-600.01) - treated by Christian [name] w/ AVODART 0.5mg /d, FLOMAX 0.4mg Bid, & SANCTURA 20mg Bid...  ~  He has hx BPH, LUTS, & elev PSA on ttriple therapy per Urology... ~  Urology was following his PSAs (last 4.95 w/ 21%Free 2/13)> Christian [name] & pt decided NOT to continue the PSA screening at his age & he is happy w/ this decision.  PSA, INCREASED (ICD-790.93) - pt states that PSA was as high as 9 and improved to 5.6.Marland KitchenMarland Kitchen pt reports recent eval for hematuria- note pending...  ~  10/10: he reports his PSA 10/10 Christian [name] was 5.3 ~  6/11: he reports that his PSA recently = 5.1 ~  12/11: PSA per Christian [name] recently = 5.28 & they continue to follow... ~  10/12: PSA here= 6.11 and we will send copy of lab to the Urology team... ~  Urology was following his PSAs (last 4.95 w/ 21%Free 2/13)> Christian [name] & pt decided NOT to continue the PSA screening at his age & he is happy w/ this decision.  DEGENERATIVE JOINT DISEASE (ICD-715.90) - on Mobic but he prefers ETODOLAC 400mg  Bid Prn.  Hx MIGRAINE  HEADACHES >>  ~  10/12: He developed pain over his right eye, headache,  dizziness, confusion, and irritability; he states that his vision was blurry and that he was unsteady on his feet and perhaps slightly weak in the left leg; there was no CP, palpit, slurring of speech, etc; he also noted what he calls "migraine aurora" which he says he gets from time to time but has increased in frequency lately (he denies migraine pain "I just get the aurora")... ~  11/12: Neuro eval from Christian [name]: 1) MRI/ MRA of head & neck> tortuous vessels w/ diminutive left vertebral vs chr occlusion; ectasia of basilar art & both carotid siphons, no stenoses or major branch occlusions.Marland KitchenMarland Kitchen 2) EEG suggested an area of neuronal dysfunction in left temporal region w/ delta & theta activity... Christian [name] speculated that he might have had a migraine HA & rec incr ASA to 325mg /d...  INSOMNIA, CHRONIC (ICD-307.42) - uses AMBIEN 10mg  Qhs...  MALIGNANT MELANOMA, HX OF (ICD-V10.82) - s/p surgery of mediastinal melanoma 1980's... no known recurrence... ~  3/12: he tells me that Christian [name] removed a squamous cell skin cancer... ~  10/12: DrJones removed several skin cancers- one right cheek, one right forearm & referred pt for Moh's...   Past Surgical History  Procedure Date  . Bilateral inguinal hernia repair   . Umbilical hernia repair   . Median sternotomy for mediastinal melanoma 1980s    Outpatient Encounter Prescriptions as of 02/11/2012  Medication Sig Dispense Refill  . ADVAIR DISKUS 500-50 MCG/DOSE AEPB INHALE 1 PUFF TWICE A DAY  1 each  11  . albuterol (PROVENTIL HFA;VENTOLIN HFA) 108 (90 BASE) MCG/ACT inhaler Inhale 2 puffs into the lungs every 6 (six) hours as needed.        Marland Kitchen aspirin 325 MG EC tablet Take 325 mg by mouth daily.        Marland Kitchen atenolol (TENORMIN) 25 MG tablet TAKE 1 TABLET BY MOUTH TWO TIMES A DAY  60 tablet  5  . AVODART 0.5 MG capsule TAKE 1 CAPSULE EVERY DAY  30 capsule  10  . Cranberry (CRANBERRY CONCENTRATE) 500 MG CAPS Take 1 capsule by mouth daily.        Marland Kitchen  dextromethorphan-guaiFENesin (MUCINEX DM) 30-600 MG per 12 hr tablet Take 1 tablet by mouth every 12 (twelve) hours.        Marland Kitchen esomeprazole (NEXIUM) 40 MG capsule Take 1 capsule (40 mg total) by mouth daily. 30 minutes before 1st meal of the day  30 capsule  11  . etodolac (ETODOLAC) 400 MG 24 hr tablet Take 1 tablet (400 mg total) by mouth daily.  60 tablet  6  . fluticasone (FLONASE) 50 MCG/ACT nasal spray Place 2 sprays into the nose daily as needed.  16 g  6  . nystatin (MYCOSTATIN) 100000 UNIT/ML suspension GARGLE AND SWALLOW 1 TSP AS DIRECTED, AS NEEDED  120 mL  5  . polyethylene glycol (MIRALAX / GLYCOLAX) packet Take 17 g by mouth daily.        . ranitidine (ZANTAC) 150 MG capsule Take 1 capsule (150 mg total) by mouth at bedtime.  30 capsule  11  . Tamsulosin HCl (FLOMAX) 0.4 MG CAPS Take 0.4 mg by mouth 2 (two) times daily.       . trospium (SANCTURA) 20 MG tablet Take 20 mg by mouth 2 (two) times daily.        Marland Kitchen zolpidem (AMBIEN) 10 MG tablet Take 1 tablet (10 mg total) by mouth  at bedtime as needed for sleep.  30 tablet  5  . zolpidem (AMBIEN) 10 MG tablet Take 1 tablet (10 mg total) by mouth at bedtime as needed.  30 tablet  5    No Known Allergies   Current Medications, Allergies, Past Medical History, Past Surgical History, Family History, and Social History were reviewed in [employer] record.    Review of Systems         [name] HPI - all other systems neg except as noted...  The patient notes mult somatic complaints including visual symptoms, headache, sl dizzy, intermittent confusion, some weakness, sl gait abnormality, etc.  The patient denies anorexia, fever, weight loss, weight gain, vision loss, decreased hearing, hoarseness, chest pain, syncope, peripheral edema, prolonged cough, hemoptysis, abdominal pain, melena, hematochezia, severe indigestion/heartburn, hematuria, incontinence, transient blindness, depression, unusual weight change, abnormal  bleeding, enlarged lymph nodes, and angioedema.   Objective:   Physical Exam     WD, WN, 78 y/o WM in NAD... GENERAL:  Alert & oriented; pleasant & cooperative... HEENT:  [place]/AT, EOM-wnl, PERRLA, EACs-clear, TMs-wnl, NOSE-clear, THROAT-clear & wnl. NECK:  Supple w/ fairROM; no JVD; normal carotid impulses w/o bruits; no thyromegaly or nodules palpated; no lymphadenopathy. CHEST:  Decr BS in bases , clear without wheezes/ rales/ or rhonchi, s/p median sternotomy. HEART:  regular rhythm; gr 2/6 sys murmur, without rubs of gallops appreciated... ABDOMEN:  Soft & nontender; normal bowel sounds; no organomegaly or masses detected. EXT: without deformities, mild arthritic changes, +ven insuffic w/ superfic VV- no c/c/edema... NEURO:  CN's intact; motor testing normal; sensory testing normal; gait normal & balance OK. No focal neuro deficit found... DERM:  no rash, no skin lesions noted...  RADIOLOGY DATA:  Reviewed in the EPIC EMR & discussed w/ the patient...  LABORATORY DATA:  Reviewed in the EPIC EMR & discussed w/ the patient...   Assessment & Plan:   COPD> remote smoking hx, stable on AdvairBid & ProairPrn, he insists on Tussionex vs Hydromet for cough & ZPak to keep on hand; has elev left hemidiaph, prev median sternotomy for mediastinal melanoma surg in the 1980s; prev CXRs & PFTs noted above...     HBP> controlled on Atenolol;  BP= 110/74 & NOTE: he was prev on DiovanHCT which he stopped, then restarted, ??why; told to STOP the Diovan for now...     CAD, Valv heart dis> followed by Christian [name] on ASA & Aten, last seen 9/11 w/ f/u due soon...     RBBB, Hx AFlutter> stable & denies CP, palpit, dizzy, ch in DOE/ edema/ etc...     VI, Edema> he knows to elim sodium, elev legs, wear support hose, etc...     Hyperchol> on Fish Oil + Diet; LDL in the 120 range, he was rec to start Crestor5mg  but he declined statin therapy & prefers diet alone...     GI> GERD, Divertics> on Prilosec 20mg  +  Zantac Qhs; he wants stronger acid suppressor & we will switch to Nexium...    BPH, Elev PSA> on Flomax, Avodart & Sanctura per Christian [name] Antony Haste; continue same & they have decided to stop PSA testing...     DJD> on Etodolac prn...  Neuro eval by Christian [name]>> ?Migraine Headaches> this is the first he has ever mentioned migraine headaches to me; he describes visual symptoms which he says are migraine auras but are never followed by headache pain; he developed visual symptoms during this episode and it was followed by right-sided headache and the other  symptoms noted above; this was addressed by the neurologist during the consultation; pt on ASA daily & will f/u w/ Christian [name].     Chronic persistant insomnia> on Ambien Qhs...     Hx Malig Melanoma> he has surg in the 1980's (mediastinal melanoma s/p median sternotomy) & no know recurrence; he is followed for Derm by Christian [name] & had a squamous cell skin cancer removed 3/12 & several basal cells discovered 10/12 7 referred for Moh's...   Patient's Medications  New Prescriptions   No medications on file  Previous Medications   ADVAIR DISKUS 500-50 MCG/DOSE AEPB    INHALE 1 PUFF TWICE A DAY   ALBUTEROL (PROVENTIL HFA;VENTOLIN HFA) 108 (90 BASE) MCG/ACT INHALER    Inhale 2 puffs into the lungs every 6 (six) hours as needed.     ASPIRIN 325 MG EC TABLET    Take 325 mg by mouth daily.     ATENOLOL (TENORMIN) 25 MG TABLET    TAKE 1 TABLET BY MOUTH TWO TIMES A DAY   AVODART 0.5 MG CAPSULE    TAKE 1 CAPSULE EVERY DAY   CRANBERRY (CRANBERRY CONCENTRATE) 500 MG CAPS    Take 1 capsule by mouth daily.     DEXTROMETHORPHAN-GUAIFENESIN (MUCINEX DM) 30-600 MG PER 12 HR TABLET    Take 1 tablet by mouth every 12 (twelve) hours.     ESOMEPRAZOLE (NEXIUM) 40 MG CAPSULE    Take 1 capsule (40 mg total) by mouth daily. 30 minutes before 1st meal of the day   ETODOLAC (ETODOLAC) 400 MG 24 HR TABLET    Take 1 tablet (400 mg total) by mouth daily.   FLUTICASONE (FLONASE) 50  MCG/ACT NASAL SPRAY    Place 2 sprays into the nose daily as needed.   GLUCOSAMINE-CHONDROITIN 500-400 MG TABLET    Take 1 tablet by mouth 3 (three) times daily.   POLYETHYLENE GLYCOL (MIRALAX / GLYCOLAX) PACKET    Take 17 g by mouth daily.     RANITIDINE (ZANTAC) 150 MG CAPSULE    Take 1 capsule (150 mg total) by mouth at bedtime.   TAMSULOSIN HCL (FLOMAX) 0.4 MG CAPS    Take 0.4 mg by mouth 2 (two) times daily.    TROSPIUM (SANCTURA) 20 MG TABLET    Take 20 mg by mouth 2 (two) times daily.     ZOLPIDEM (AMBIEN) 10 MG TABLET    Take 1 tablet (10 mg total) by mouth at bedtime as needed.  Modified Medications   Modified Medication Previous Medication   NYSTATIN (MYCOSTATIN) 100000 UNIT/ML SUSPENSION nystatin (MYCOSTATIN) 100000 UNIT/ML suspension      Gargle and swallow 1 tsp as directed, as needed    GARGLE AND SWALLOW 1 TSP AS DIRECTED, AS NEEDED  Discontinued Medications   ZOLPIDEM (AMBIEN) 10 MG TABLET    Take 1 tablet (10 mg total) by mouth at bedtime as needed for sleep.

## 2012-02-26 ENCOUNTER — Other Ambulatory Visit: Payer: Self-pay | Admitting: Dermatology

## 2012-03-03 ENCOUNTER — Ambulatory Visit: Payer: Medicare Other | Admitting: Neurology

## 2012-03-30 ENCOUNTER — Other Ambulatory Visit: Payer: Self-pay | Admitting: Pulmonary Disease

## 2012-04-14 ENCOUNTER — Other Ambulatory Visit: Payer: Self-pay | Admitting: Cardiology

## 2012-04-14 ENCOUNTER — Other Ambulatory Visit: Payer: Self-pay | Admitting: Pulmonary Disease

## 2012-04-22 ENCOUNTER — Other Ambulatory Visit: Payer: Self-pay | Admitting: Pulmonary Disease

## 2012-04-29 ENCOUNTER — Ambulatory Visit: Payer: Medicare Other | Admitting: Cardiology

## 2012-05-04 ENCOUNTER — Ambulatory Visit (INDEPENDENT_AMBULATORY_CARE_PROVIDER_SITE_OTHER): Payer: Medicare Other

## 2012-05-04 DIAGNOSIS — Z23 Encounter for immunization: Secondary | ICD-10-CM

## 2012-05-18 ENCOUNTER — Encounter: Payer: Self-pay | Admitting: Cardiology

## 2012-05-18 ENCOUNTER — Ambulatory Visit (INDEPENDENT_AMBULATORY_CARE_PROVIDER_SITE_OTHER): Payer: Medicare Other | Admitting: Cardiology

## 2012-05-18 VITALS — BP 110/64 | HR 81 | Ht 72.0 in | Wt 183.0 lb

## 2012-05-18 DIAGNOSIS — I350 Nonrheumatic aortic (valve) stenosis: Secondary | ICD-10-CM

## 2012-05-18 DIAGNOSIS — I4892 Unspecified atrial flutter: Secondary | ICD-10-CM

## 2012-05-18 DIAGNOSIS — I25810 Atherosclerosis of coronary artery bypass graft(s) without angina pectoris: Secondary | ICD-10-CM

## 2012-05-18 DIAGNOSIS — I10 Essential (primary) hypertension: Secondary | ICD-10-CM

## 2012-05-18 DIAGNOSIS — I050 Rheumatic mitral stenosis: Secondary | ICD-10-CM

## 2012-05-18 DIAGNOSIS — I359 Nonrheumatic aortic valve disorder, unspecified: Secondary | ICD-10-CM

## 2012-05-18 NOTE — Progress Notes (Signed)
Patient ID: Christian [name], male   DOB: Sep 17, 1931, 78 y.o.   MRN: [id number]   HPI   Patient is seen today to followup mild coronary disease. He's also seen to followup valvular heart disease. He's actually doing well. He is fully active. He continues on a very rare basis to have some slight dizziness when he is working actively. He has not had syncope or presyncope.  When I saw him in February, 2013 we decided to proceed with a 2-D echo to reassess his valvular status. Ejection fraction was 55-60%. There was mild aortic stenosis. 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 This appears to be moderate. This will be followed over time.  Allergies  Allergen Reactions  . Ivp Dye (Iodinated Diagnostic Agents)     Current Outpatient Prescriptions  Medication Sig Dispense Refill  . ADVAIR DISKUS 500-50 MCG/DOSE AEPB INHALE 1 PUFF TWICE A DAY  1 each  11  . albuterol (PROVENTIL HFA;VENTOLIN HFA) 108 (90 BASE) MCG/ACT inhaler Inhale 2 puffs into the lungs every 6 (six) hours as needed.        Marland Kitchen aspirin 325 MG EC tablet Take 325 mg by mouth daily.        Marland Kitchen atenolol (TENORMIN) 25 MG tablet TAKE 1 TABLET BY MOUTH TWO TIMES A DAY  60 tablet  5  . AVODART 0.5 MG capsule TAKE 1 CAPSULE EVERY DAY  30 capsule  10  . Cranberry (CRANBERRY CONCENTRATE) 500 MG CAPS Take 1 capsule by mouth daily.        Marland Kitchen dextromethorphan-guaiFENesin (MUCINEX DM) 30-600 MG per 12 hr tablet Take 1 tablet by mouth every 12 (twelve) hours.        Marland Kitchen etodolac (LODINE XL) 400 MG 24 hr tablet TAKE 1 TABLET (400 MG TOTAL) BY MOUTH DAILY.  60 tablet  5  . fluticasone (FLONASE) 50 MCG/ACT nasal spray Place 2 sprays into the nose daily as needed.  16 g  6  . glucosamine-chondroitin 500-400 MG tablet Take 1 tablet by mouth 3 (three) times daily.      Marland Kitchen NEXIUM 40 MG capsule TAKE 1 CAPSULE (40 MG TOTAL) BY MOUTH DAILY. 30 MINUTES BEFORE 1ST MEAL OF THE DAY  30 capsule  11  . nystatin (MYCOSTATIN) 100000 UNIT/ML suspension Gargle and swallow 1  tsp as directed, as needed  240 mL  5  . polyethylene glycol (MIRALAX / GLYCOLAX) packet Take 17 g by mouth daily.        . ranitidine (ZANTAC) 150 MG capsule TAKE 1 CAPSULE (150 MG TOTAL) BY MOUTH AT BEDTIME.  30 capsule  10  . Tamsulosin HCl (FLOMAX) 0.4 MG CAPS Take 0.4 mg by mouth 2 (two) times daily.       . trospium (SANCTURA) 20 MG tablet Take 20 mg by mouth 2 (two) times daily.        Marland Kitchen zolpidem (AMBIEN) 10 MG tablet Take 1 tablet (10 mg total) by mouth at bedtime as needed.  30 tablet  5    History   Social History  . Marital Status: Married    Spouse Name: evelyn Crochet    Number of Children: 6  . Years of Education: N/A   Occupational History  . 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 Smoking status: Former Smoker    Quit date: 08/03/1960  . Smokeless tobacco: Never Used  . Alcohol Use: Yes     social use  . Drug Use: No  .  Sexually Active: Not on file   Other Topics Concern  . Not on file   Social History Narrative   2 biological children and 4  adopted children    No family history on file.  Past Medical History  Diagnosis Date  . Allergic rhinitis   . COPD (chronic obstructive pulmonary disease)   . Disorders of diaphragm   . Acute bronchitis   . Hypertension   . CAD (coronary artery disease)     Catheterization 2009, mild coronary disease (after abnormal nuclear study,With hypotensive response to exercise, 2009)  . RBBB   . Atrial flutter     Ablated in the past, no recurrence  . Venous insufficiency   . Edema   . Dyslipidemia     Patient has an and to use statin  . GERD (gastroesophageal reflux disease)   . Diverticulosis of colon   . Hypertrophy of prostate with urinary obstruction and other lower urinary tract symptoms (LUTS)   . Increased prostate specific antigen (PSA) velocity   . DJD (degenerative joint disease)   . Chronic insomnia   . Malignant melanoma     Removed from mediastinum via thoracotomy July, 2010  .  Ejection fraction     EF 60%,Echo, 2009,  /  EF 55-60%, echo, February, 2013  . Aortic stenosis     Mild, echo and catheterization, 2009 /  Mild, echo, February, 2013  . Mitral stenosis     Very mild, echo, from mitral annular calcification  . Shortness of breath     Episodes of feeling shortness of breath with some mild dizziness with mild exertion., February, 2013  . Tricuspid regurgitation     Moderate, echo, February, 2013, 56 mmHg  . Pulmonary hypertension     56 mmHg, echo, February, 2013    Past Surgical History  Procedure Date  . Bilateral inguinal hernia repair   . Umbilical hernia repair   . Median sternotomy for mediastinal melanoma 1980s    Patient Active Problem List  Diagnosis  . INSOMNIA, CHRONIC  . ESSENTIAL HYPERTENSION  . BRONCHITIS, ACUTE  . ALLERGIC RHINITIS  . DISORDERS OF DIAPHRAGM  . GERD  . DIVERTICULOSIS OF COLON  . CONSTIPATION  . HYPERTROPHY PROSTATE W/UR OBST & OTH LUTS  . DEGENERATIVE JOINT DISEASE  . EDEMA  . PSA, INCREASED  . CONTUSION OF LOWER LEG  . COPD (chronic obstructive pulmonary disease)  . CAD (coronary artery disease)  . RBBB  . Atrial flutter  . Venous insufficiency  . Dyslipidemia  . Malignant melanoma  . Mitral stenosis  . Shortness of breath  . Ejection fraction  . Aortic stenosis  . Tricuspid regurgitation  . Pulmonary hypertension    ROS   Patient denies fever, chills, headache, sweats, rash, change in vision, change in hearing, chest pain, cough, nausea vomiting, urinary symptoms. All other systems are reviewed and are negative.  PHYSICAL EXAM  Patient is oriented to person time and place. Affect is normal. There is no jugulovenous distention. Lungs are clear. Respiratory effort is nonlabored. Cardiac exam reveals S1 and S2. There is a murmur of mild aortic stenosis. I do not hear any diastolic rumble. His abdomen is soft. There is no peripheral edema. There no musculoskeletal deformities. There are no skin  rashes.  Filed Vitals:   05/18/12 1120  BP: 110/64  Pulse: 81  Height: 6' (1.829 m)  Weight: 183 lb (83.008 kg)  SpO2: 97%   EKG is done today and reviewed by me.  There is right bundle branch block that is old. No change.  ASSESSMENT & PLAN

## 2012-05-18 NOTE — Assessment & Plan Note (Signed)
Blood pressure stable. No change in therapy. 

## 2012-05-18 NOTE — Assessment & Plan Note (Signed)
The patient does have functional mitral stenosis from his annular calcification and thickening of the leaflets. It is moderate at this time. This will be followed.

## 2012-05-18 NOTE — Assessment & Plan Note (Signed)
His atrial flutter was ablated in the past. He has continued normal sinus rhythm.

## 2012-05-18 NOTE — Assessment & Plan Note (Signed)
He has mild aortic stenosis. This has not changed. This will be followed. See him back in one year.

## 2012-06-27 ENCOUNTER — Encounter (HOSPITAL_COMMUNITY): Payer: Self-pay | Admitting: Emergency Medicine

## 2012-06-27 ENCOUNTER — Inpatient Hospital Stay (HOSPITAL_COMMUNITY)
Admission: EM | Admit: 2012-06-27 | Discharge: 2012-07-12 | DRG: 190 | Disposition: A | Discharge status: Skilled Nursing Facility | Payer: Medicare Other | Attending: Internal Medicine | Admitting: Internal Medicine

## 2012-06-27 ENCOUNTER — Emergency Department (HOSPITAL_COMMUNITY): Payer: Medicare Other

## 2012-06-27 ENCOUNTER — Telehealth: Payer: Self-pay | Admitting: Pulmonary Disease

## 2012-06-27 DIAGNOSIS — E785 Hyperlipidemia, unspecified: Secondary | ICD-10-CM

## 2012-06-27 DIAGNOSIS — N400 Enlarged prostate without lower urinary tract symptoms: Secondary | ICD-10-CM

## 2012-06-27 DIAGNOSIS — M1990 Unspecified osteoarthritis, unspecified site: Secondary | ICD-10-CM

## 2012-06-27 DIAGNOSIS — I872 Venous insufficiency (chronic) (peripheral): Secondary | ICD-10-CM

## 2012-06-27 DIAGNOSIS — C439 Malignant melanoma of skin, unspecified: Secondary | ICD-10-CM

## 2012-06-27 DIAGNOSIS — K5900 Constipation, unspecified: Secondary | ICD-10-CM

## 2012-06-27 DIAGNOSIS — J440 Chronic obstructive pulmonary disease with acute lower respiratory infection: Secondary | ICD-10-CM | POA: Diagnosis present

## 2012-06-27 DIAGNOSIS — J309 Allergic rhinitis, unspecified: Secondary | ICD-10-CM

## 2012-06-27 DIAGNOSIS — N401 Enlarged prostate with lower urinary tract symptoms: Secondary | ICD-10-CM

## 2012-06-27 DIAGNOSIS — J852 Abscess of lung without pneumonia: Secondary | ICD-10-CM | POA: Diagnosis present

## 2012-06-27 DIAGNOSIS — D72829 Elevated white blood cell count, unspecified: Secondary | ICD-10-CM

## 2012-06-27 DIAGNOSIS — I272 Pulmonary hypertension, unspecified: Secondary | ICD-10-CM

## 2012-06-27 DIAGNOSIS — R609 Edema, unspecified: Secondary | ICD-10-CM

## 2012-06-27 DIAGNOSIS — I959 Hypotension, unspecified: Secondary | ICD-10-CM | POA: Diagnosis not present

## 2012-06-27 DIAGNOSIS — I071 Rheumatic tricuspid insufficiency: Secondary | ICD-10-CM

## 2012-06-27 DIAGNOSIS — I079 Rheumatic tricuspid valve disease, unspecified: Secondary | ICD-10-CM | POA: Diagnosis present

## 2012-06-27 DIAGNOSIS — J9621 Acute and chronic respiratory failure with hypoxia: Secondary | ICD-10-CM

## 2012-06-27 DIAGNOSIS — I4892 Unspecified atrial flutter: Secondary | ICD-10-CM

## 2012-06-27 DIAGNOSIS — S8010XA Contusion of unspecified lower leg, initial encounter: Secondary | ICD-10-CM

## 2012-06-27 DIAGNOSIS — R5381 Other malaise: Secondary | ICD-10-CM | POA: Diagnosis present

## 2012-06-27 DIAGNOSIS — J850 Gangrene and necrosis of lung: Secondary | ICD-10-CM

## 2012-06-27 DIAGNOSIS — J986 Disorders of diaphragm: Secondary | ICD-10-CM

## 2012-06-27 DIAGNOSIS — I2789 Other specified pulmonary heart diseases: Secondary | ICD-10-CM | POA: Diagnosis present

## 2012-06-27 DIAGNOSIS — G4700 Insomnia, unspecified: Secondary | ICD-10-CM

## 2012-06-27 DIAGNOSIS — I10 Essential (primary) hypertension: Secondary | ICD-10-CM

## 2012-06-27 DIAGNOSIS — IMO0002 Reserved for concepts with insufficient information to code with codable children: Secondary | ICD-10-CM

## 2012-06-27 DIAGNOSIS — I2510 Atherosclerotic heart disease of native coronary artery without angina pectoris: Secondary | ICD-10-CM

## 2012-06-27 DIAGNOSIS — I350 Nonrheumatic aortic (valve) stenosis: Secondary | ICD-10-CM

## 2012-06-27 DIAGNOSIS — N138 Other obstructive and reflux uropathy: Secondary | ICD-10-CM | POA: Diagnosis present

## 2012-06-27 DIAGNOSIS — R9430 Abnormal result of cardiovascular function study, unspecified: Secondary | ICD-10-CM

## 2012-06-27 DIAGNOSIS — Z66 Do not resuscitate: Secondary | ICD-10-CM | POA: Diagnosis not present

## 2012-06-27 DIAGNOSIS — J690 Pneumonitis due to inhalation of food and vomit: Secondary | ICD-10-CM

## 2012-06-27 DIAGNOSIS — K219 Gastro-esophageal reflux disease without esophagitis: Secondary | ICD-10-CM

## 2012-06-27 DIAGNOSIS — J9620 Acute and chronic respiratory failure, unspecified whether with hypoxia or hypercapnia: Secondary | ICD-10-CM | POA: Diagnosis present

## 2012-06-27 DIAGNOSIS — B370 Candidal stomatitis: Secondary | ICD-10-CM

## 2012-06-27 DIAGNOSIS — J449 Chronic obstructive pulmonary disease, unspecified: Secondary | ICD-10-CM

## 2012-06-27 DIAGNOSIS — I080 Rheumatic disorders of both mitral and aortic valves: Secondary | ICD-10-CM | POA: Diagnosis present

## 2012-06-27 DIAGNOSIS — I4510 Unspecified right bundle-branch block: Secondary | ICD-10-CM

## 2012-06-27 DIAGNOSIS — I050 Rheumatic mitral stenosis: Secondary | ICD-10-CM

## 2012-06-27 DIAGNOSIS — R1313 Dysphagia, pharyngeal phase: Secondary | ICD-10-CM

## 2012-06-27 DIAGNOSIS — J209 Acute bronchitis, unspecified: Secondary | ICD-10-CM

## 2012-06-27 DIAGNOSIS — R972 Elevated prostate specific antigen [PSA]: Secondary | ICD-10-CM

## 2012-06-27 DIAGNOSIS — T380X5A Adverse effect of glucocorticoids and synthetic analogues, initial encounter: Secondary | ICD-10-CM | POA: Diagnosis not present

## 2012-06-27 DIAGNOSIS — Z79899 Other long term (current) drug therapy: Secondary | ICD-10-CM

## 2012-06-27 DIAGNOSIS — J15212 Pneumonia due to Methicillin resistant Staphylococcus aureus: Secondary | ICD-10-CM | POA: Diagnosis present

## 2012-06-27 DIAGNOSIS — R0602 Shortness of breath: Secondary | ICD-10-CM

## 2012-06-27 DIAGNOSIS — K5730 Diverticulosis of large intestine without perforation or abscess without bleeding: Secondary | ICD-10-CM

## 2012-06-27 DIAGNOSIS — J189 Pneumonia, unspecified organism: Secondary | ICD-10-CM

## 2012-06-27 DIAGNOSIS — J441 Chronic obstructive pulmonary disease with (acute) exacerbation: Secondary | ICD-10-CM

## 2012-06-27 DIAGNOSIS — I38 Endocarditis, valve unspecified: Secondary | ICD-10-CM

## 2012-06-27 LAB — LIPID PANEL
HDL: 51 mg/dL (ref >39)
LDL Cholesterol: 76 mg/dL (ref 0–99)
Triglycerides: 40 mg/dL (ref <150)

## 2012-06-27 LAB — CBC WITH DIFFERENTIAL/PLATELET
Basophils Absolute: 0 10*3/uL (ref 0.0–0.1)
Eosinophils Absolute: 0 10*3/uL (ref 0.0–0.7)
Eosinophils Relative: 0 % (ref 0–5)
HCT: 40.8 % (ref 39.0–52.0)
Lymphocytes Relative: 3 % — ABNORMAL LOW (ref 12–46)
MCH: 29.3 pg (ref 26.0–34.0)
MCV: 86.6 fL (ref 78.0–100.0)
Monocytes Absolute: 1.4 10*3/uL — ABNORMAL HIGH (ref 0.1–1.0)
Platelets: 152 10*3/uL (ref 150–400)
RDW: 13 % (ref 11.5–15.5)
WBC: 10.9 10*3/uL — ABNORMAL HIGH (ref 4.0–10.5)

## 2012-06-27 LAB — COMPREHENSIVE METABOLIC PANEL
ALT: 18 U/L (ref 0–53)
AST: 23 U/L (ref 0–37)
Albumin: 3.4 g/dL — ABNORMAL LOW (ref 3.5–5.2)
Alkaline Phosphatase: 70 U/L (ref 39–117)
Chloride: 102 mEq/L (ref 96–112)
Potassium: 3.5 mEq/L (ref 3.5–5.1)
Sodium: 135 mEq/L (ref 135–145)
Total Bilirubin: 2.1 mg/dL — ABNORMAL HIGH (ref 0.3–1.2)
Total Protein: 5.5 g/dL — ABNORMAL LOW (ref 6.0–8.3)

## 2012-06-27 MED ORDER — HEPARIN SODIUM (PORCINE) 5000 UNIT/ML IJ SOLN
5000.0000 [IU] | Freq: Three times a day (TID) | INTRAMUSCULAR | Status: DC
  Administered 2012-06-27 – 2012-07-02 (×4): 5000 [IU] via SUBCUTANEOUS
  Filled 2012-06-27 (×18): qty 1

## 2012-06-27 MED ORDER — DARIFENACIN HYDROBROMIDE ER 7.5 MG PO TB24
7.5000 mg | ORAL_TABLET | Freq: Every day | ORAL | Status: DC
  Administered 2012-06-27 – 2012-07-12 (×16): 7.5 mg via ORAL
  Filled 2012-06-27 (×16): qty 1

## 2012-06-27 MED ORDER — ETODOLAC ER 400 MG PO TB24: 400.0000 mg | ORAL_TABLET | Freq: Every day | ORAL | Status: DC

## 2012-06-27 MED ORDER — IPRATROPIUM BROMIDE 0.02 % IN SOLN
0.5000 mg | RESPIRATORY_TRACT | Status: DC
  Administered 2012-06-27 – 2012-06-28 (×5): 0.5 mg via RESPIRATORY_TRACT
  Filled 2012-06-27 (×5): qty 2.5

## 2012-06-27 MED ORDER — IPRATROPIUM BROMIDE 0.02 % IN SOLN
0.5000 mg | RESPIRATORY_TRACT | Status: DC | PRN
  Administered 2012-07-03 – 2012-07-04 (×3): 0.5 mg via RESPIRATORY_TRACT
  Filled 2012-06-27 (×12): qty 2.5

## 2012-06-27 MED ORDER — DM-GUAIFENESIN ER 30-600 MG PO TB12
1.0000 | ORAL_TABLET | Freq: Two times a day (BID) | ORAL | Status: DC
  Administered 2012-06-27 – 2012-06-28 (×3): 1 via ORAL
  Administered 2012-06-29: 09:00:00 via ORAL
  Administered 2012-06-29 – 2012-07-03 (×8): 1 via ORAL
  Administered 2012-07-03 – 2012-07-04 (×2): via ORAL
  Filled 2012-06-27 (×16): qty 1

## 2012-06-27 MED ORDER — MORPHINE SULFATE 2 MG/ML IJ SOLN
2.0000 mg | INTRAMUSCULAR | Status: DC | PRN
  Administered 2012-06-28 – 2012-07-02 (×2): 2 mg via INTRAVENOUS
  Filled 2012-06-27 (×2): qty 1

## 2012-06-27 MED ORDER — ALBUTEROL (5 MG/ML) CONTINUOUS INHALATION SOLN
10.0000 mg/h | INHALATION_SOLUTION | Freq: Once | RESPIRATORY_TRACT | Status: AC
  Administered 2012-06-27: 10 mg/h via RESPIRATORY_TRACT

## 2012-06-27 MED ORDER — SODIUM CHLORIDE 0.9 % IJ SOLN
3.0000 mL | Freq: Two times a day (BID) | INTRAMUSCULAR | Status: DC
  Administered 2012-06-27 – 2012-07-12 (×22): 3 mL via INTRAVENOUS

## 2012-06-27 MED ORDER — ALBUTEROL SULFATE (5 MG/ML) 0.5% IN NEBU
INHALATION_SOLUTION | RESPIRATORY_TRACT | Status: AC
  Filled 2012-06-27: qty 2

## 2012-06-27 MED ORDER — ALBUTEROL SULFATE (5 MG/ML) 0.5% IN NEBU
2.5000 mg | INHALATION_SOLUTION | RESPIRATORY_TRACT | Status: DC
  Administered 2012-06-27 – 2012-06-28 (×5): 2.5 mg via RESPIRATORY_TRACT
  Filled 2012-06-27 (×5): qty 0.5

## 2012-06-27 MED ORDER — ONDANSETRON HCL 4 MG/2ML IJ SOLN
4.0000 mg | Freq: Four times a day (QID) | INTRAMUSCULAR | Status: DC | PRN
  Administered 2012-07-11: 4 mg via INTRAVENOUS
  Filled 2012-06-27: qty 2

## 2012-06-27 MED ORDER — FAMOTIDINE 20 MG PO TABS
20.0000 mg | ORAL_TABLET | Freq: Every day | ORAL | Status: DC
  Administered 2012-06-27 – 2012-07-10 (×14): 20 mg via ORAL
  Filled 2012-06-27 (×15): qty 1

## 2012-06-27 MED ORDER — SODIUM CHLORIDE 0.9 % IV SOLN
INTRAVENOUS | Status: DC
  Administered 2012-06-27: 09:00:00 via INTRAVENOUS

## 2012-06-27 MED ORDER — ONDANSETRON HCL 4 MG PO TABS: 4.0000 mg | ORAL_TABLET | Freq: Four times a day (QID) | ORAL | Status: DC | PRN

## 2012-06-27 MED ORDER — ETODOLAC 200 MG PO CAPS
200.0000 mg | ORAL_CAPSULE | Freq: Two times a day (BID) | ORAL | Status: DC
  Administered 2012-06-27 – 2012-07-12 (×30): 200 mg via ORAL
  Filled 2012-06-27 (×31): qty 1

## 2012-06-27 MED ORDER — SODIUM CHLORIDE 0.9 % IJ SOLN
3.0000 mL | Freq: Two times a day (BID) | INTRAMUSCULAR | Status: DC
  Administered 2012-07-01 – 2012-07-09 (×9): 3 mL via INTRAVENOUS

## 2012-06-27 MED ORDER — PANTOPRAZOLE SODIUM 40 MG PO TBEC
40.0000 mg | DELAYED_RELEASE_TABLET | Freq: Every day | ORAL | Status: DC
  Administered 2012-06-28 – 2012-07-07 (×10): 40 mg via ORAL
  Filled 2012-06-27 (×11): qty 1

## 2012-06-27 MED ORDER — IPRATROPIUM BROMIDE 0.02 % IN SOLN
0.5000 mg | Freq: Once | RESPIRATORY_TRACT | Status: AC
  Administered 2012-06-27: 0.5 mg via RESPIRATORY_TRACT
  Filled 2012-06-27: qty 2.5

## 2012-06-27 MED ORDER — TAMSULOSIN HCL 0.4 MG PO CAPS
0.4000 mg | ORAL_CAPSULE | Freq: Two times a day (BID) | ORAL | Status: DC
  Administered 2012-06-27 – 2012-07-12 (×30): 0.4 mg via ORAL
  Filled 2012-06-27 (×32): qty 1

## 2012-06-27 MED ORDER — DUTASTERIDE 0.5 MG PO CAPS
0.5000 mg | ORAL_CAPSULE | Freq: Every day | ORAL | Status: DC
  Administered 2012-06-27 – 2012-07-12 (×16): 0.5 mg via ORAL
  Filled 2012-06-27 (×16): qty 1

## 2012-06-27 MED ORDER — ASPIRIN EC 325 MG PO TBEC
325.0000 mg | DELAYED_RELEASE_TABLET | Freq: Every day | ORAL | Status: DC
  Administered 2012-06-28 – 2012-07-12 (×14): 325 mg via ORAL
  Filled 2012-06-27 (×16): qty 1

## 2012-06-27 MED ORDER — POLYETHYLENE GLYCOL 3350 17 G PO PACK
17.0000 g | PACK | Freq: Every day | ORAL | Status: DC
  Administered 2012-06-27 – 2012-07-10 (×10): 17 g via ORAL
  Filled 2012-06-27 (×16): qty 1

## 2012-06-27 MED ORDER — ATENOLOL 25 MG PO TABS
25.0000 mg | ORAL_TABLET | Freq: Two times a day (BID) | ORAL | Status: DC
  Administered 2012-06-27: 25 mg via ORAL
  Filled 2012-06-27 (×3): qty 1

## 2012-06-27 MED ORDER — METHYLPREDNISOLONE SODIUM SUCC 125 MG IJ SOLR
60.0000 mg | Freq: Four times a day (QID) | INTRAMUSCULAR | Status: DC
  Administered 2012-06-27 – 2012-06-29 (×8): 60 mg via INTRAVENOUS
  Filled 2012-06-27 (×11): qty 0.96

## 2012-06-27 MED ORDER — SODIUM CHLORIDE 0.9 % IJ SOLN: 3.0000 mL | INTRAMUSCULAR | Status: DC | PRN

## 2012-06-27 MED ORDER — METHYLPREDNISOLONE SODIUM SUCC 125 MG IJ SOLR
125.0000 mg | Freq: Once | INTRAMUSCULAR | Status: AC
  Administered 2012-06-27: 125 mg via INTRAVENOUS
  Filled 2012-06-27: qty 2

## 2012-06-27 MED ORDER — SODIUM CHLORIDE 0.9 % IV SOLN: 250.0000 mL | INTRAVENOUS | Status: DC | PRN

## 2012-06-27 MED ORDER — ACETAMINOPHEN 325 MG PO TABS: 650.0000 mg | ORAL_TABLET | Freq: Four times a day (QID) | ORAL | Status: DC | PRN

## 2012-06-27 MED ORDER — LEVOFLOXACIN IN D5W 750 MG/150ML IV SOLN
750.0000 mg | INTRAVENOUS | Status: DC
  Administered 2012-06-27 – 2012-06-28 (×2): 750 mg via INTRAVENOUS
  Filled 2012-06-27 (×3): qty 150

## 2012-06-27 MED ORDER — ACETAMINOPHEN 650 MG RE SUPP: 650.0000 mg | Freq: Four times a day (QID) | RECTAL | Status: DC | PRN

## 2012-06-27 MED ORDER — GLUCOSAMINE-CHONDROITIN 500-400 MG PO TABS: 1.0000 | ORAL_TABLET | Freq: Three times a day (TID) | ORAL | Status: DC

## 2012-06-27 MED ORDER — ALBUTEROL SULFATE (5 MG/ML) 0.5% IN NEBU
2.5000 mg | INHALATION_SOLUTION | RESPIRATORY_TRACT | Status: DC | PRN
  Administered 2012-06-27: 2.5 mg via RESPIRATORY_TRACT
  Filled 2012-06-27: qty 0.5

## 2012-06-27 MED ORDER — LIP MEDEX EX OINT
TOPICAL_OINTMENT | CUTANEOUS | Status: DC | PRN
  Filled 2012-06-27: qty 7

## 2012-06-27 MED ORDER — OXYCODONE HCL 5 MG PO TABS
5.0000 mg | ORAL_TABLET | ORAL | Status: DC | PRN
  Administered 2012-06-29 – 2012-07-03 (×5): 5 mg via ORAL
  Filled 2012-06-27 (×5): qty 1

## 2012-06-27 MED ORDER — ALBUTEROL SULFATE (5 MG/ML) 0.5% IN NEBU
5.0000 mg | INHALATION_SOLUTION | Freq: Once | RESPIRATORY_TRACT | Status: AC
  Administered 2012-06-27: 5 mg via RESPIRATORY_TRACT
  Filled 2012-06-27: qty 1

## 2012-06-27 NOTE — ED Notes (Signed)
Gave patient a sandwich and a stick of cheese

## 2012-06-27 NOTE — Progress Notes (Signed)
Call to hospitalist as pt with increased respirations and unable to settle down. Pt has had breathing tx x2 since beginning of shift. Called RT earlier for flutter valve, instructed pt on use. Pt with somewhat productive cough, pink tinged at times. He refused pm dose of heparin as he is fearful he will cough up more blood, reassured.

## 2012-06-27 NOTE — H&P (Addendum)
PCP:   Michele Mcalpine, MD   Chief Complaint:  Shortness of breath.  HPI: This is a 78 year old male, with known history of COPD, elevated right hemidiaphragm, Pulmonary HTN, allergic rhinitis, HTN, CAD, chronic RBBB, A.Flutter, s/p ablation 2003, valvular heart disease, chronic LE venous insufficiency/edema, dyslipidemia, GERD, diverticulosis, , BPH, DJD, s/p thoracotomy for mediastinal malignant melanoma in 1980s, presenting with progressive shortness of breath and wheeze. According to patient, his symptoms started on 06/22/12, with cough, productive of greenish-brownish phlegm, increasing shortness of breath and wheeze, as well as central chest pain on coughing. He denies sick contacts, and utilized his bronchodilators to no avail. In the night of 06/26/12, the phlegm became blood-streaked, and in AM of 06/27/12, he had chills, but no fever. He drove himself to the ED, where he was administered 2 nebulizer treatments, and referred for admission.   Allergies:   Allergies  Allergen Reactions  . Ivp Dye (Iodinated Diagnostic Agents)       Past Medical History  Diagnosis Date  . Allergic rhinitis   . COPD (chronic obstructive pulmonary disease)   . Disorders of diaphragm   . Acute bronchitis   . Hypertension   . CAD (coronary artery disease)     Catheterization 2009, mild coronary disease (after abnormal nuclear study,With hypotensive response to exercise, 2009)  . RBBB   . Atrial flutter     Ablated in the past, no recurrence  . Venous insufficiency   . Edema   . Dyslipidemia     Patient has an and to use statin  . GERD (gastroesophageal reflux disease)   . Diverticulosis of colon   . Hypertrophy of prostate with urinary obstruction and other lower urinary tract symptoms (LUTS)   . Increased prostate specific antigen (PSA) velocity   . DJD (degenerative joint disease)   . Chronic insomnia   . Malignant melanoma     Removed from mediastinum via thoracotomy July, 2010  . Ejection  fraction     EF 60%,Echo, 2009,  /  EF 55-60%, echo, February, 2013  . Aortic stenosis     Mild, echo and catheterization, 2009 /  Mild, echo, February, 2013  . Mitral stenosis     Very mild, echo, from mitral annular calcification  . Shortness of breath     Episodes of feeling shortness of breath with some mild dizziness with mild exertion., February, 2013  . Tricuspid regurgitation     Moderate, echo, February, 2013, 56 mmHg  . Pulmonary hypertension     56 mmHg, echo, February, 2013    Past Surgical History  Procedure Date  . Bilateral inguinal hernia repair   . Umbilical hernia repair   . Median sternotomy for mediastinal melanoma 1980s    Prior to Admission medications   Medication Sig Start Date End Date Taking? Authorizing Provider  ADVAIR DISKUS 500-50 MCG/DOSE AEPB INHALE 1 PUFF TWICE A DAY 07/13/11  Yes Michele Mcalpine, MD  albuterol (PROVENTIL HFA;VENTOLIN HFA) 108 (90 BASE) MCG/ACT inhaler Inhale 2 puffs into the lungs every 6 (six) hours as needed. Wheezing and shortness of breath   Yes Historical Provider, MD  aspirin 325 MG EC tablet Take 325 mg by mouth daily.     Yes Historical Provider, MD  atenolol (TENORMIN) 25 MG tablet TAKE 1 TABLET BY MOUTH TWO TIMES A DAY 04/14/12  Yes Luis Abed, MD  AVODART 0.5 MG capsule TAKE 1 CAPSULE EVERY DAY 07/15/11  Yes Michele Mcalpine, MD  Cranberry (  CRANBERRY CONCENTRATE) 500 MG CAPS Take 1 capsule by mouth daily.     Yes Historical Provider, MD  dextromethorphan-guaiFENesin (MUCINEX DM) 30-600 MG per 12 hr tablet Take 1 tablet by mouth every 12 (twelve) hours.     Yes Historical Provider, MD  etodolac (LODINE XL) 400 MG 24 hr tablet TAKE 1 TABLET (400 MG TOTAL) BY MOUTH DAILY. 04/22/12  Yes Michele Mcalpine, MD  glucosamine-chondroitin 500-400 MG tablet Take 1 tablet by mouth 3 (three) times daily.   Yes Historical Provider, MD  NEXIUM 40 MG capsule TAKE 1 CAPSULE (40 MG TOTAL) BY MOUTH DAILY. 30 MINUTES BEFORE 1ST MEAL OF THE DAY  03/30/12  Yes Michele Mcalpine, MD  nystatin (MYCOSTATIN) 100000 UNIT/ML suspension Gargle and swallow 1 tsp as directed, as needed 02/11/12  Yes Michele Mcalpine, MD  polyethylene glycol (MIRALAX / GLYCOLAX) packet Take 17 g by mouth daily.    Yes Historical Provider, MD  ranitidine (ZANTAC) 150 MG capsule TAKE 1 CAPSULE (150 MG TOTAL) BY MOUTH AT BEDTIME. 04/14/12  Yes Michele Mcalpine, MD  Tamsulosin HCl (FLOMAX) 0.4 MG CAPS Take 0.4 mg by mouth 2 (two) times daily.    Yes Historical Provider, MD  trospium (SANCTURA) 20 MG tablet Take 20 mg by mouth 2 (two) times daily.     Yes Historical Provider, MD    Social History: Married, reports that he quit smoking about 51 years ago. He has never used smokeless tobacco. He reports that he drinks alcohol. He reports that he does not use illicit drugs.  Family History:  Father died at age 47 years from CHF, mother died at age 13 years, from Alzheimer's disease.   Review of Systems:  As per HPI and chief complaint. Patent denies fatigue, diminished appetite, weight loss, fever, headache, blurred vision, difficulty in speaking, dysphagia, orthopnea, paroxysmal nocturnal dyspnea, nausea, diaphoresis, abdominal pain, vomiting, diarrhea, belching, heartburn, hematemesis, melena, dysuria, nocturia, urinary frequency, hematochezia, lower extremity swelling, pain, or redness. The rest of the systems review is negative.  Physical Exam:  General:  Patient is visibly short of breath at rest, exacerbated by effort, but is able to talk in complete sentences. He has a frequent "fruity" cough.  HEENT:  No clinical pallor, no jaundice, no conjunctival injection or discharge. Hydration is fair.  NECK:  Supple, JVP not seen, no carotid bruits, no palpable lymphadenopathy, no palpable goiter. CHEST:  Few expiratory rhonchi, has bilateral coarse crackles, changing with coughing. HEART:  Sounds 1 and 2 heard, normal, regular, mildly tachycardic, has systolic murmur. ABDOMEN:   Full, soft, non-tender, no palpable organomegaly, no palpable masses, normal bowel sounds. GENITALIA:  Not examined. LOWER EXTREMITIES:  No pitting edema, palpable peripheral pulses. Prominent superficial veins. MUSCULOSKELETAL SYSTEM:  Generalized osteoarthritic changes, otherwise, normal. CENTRAL NERVOUS SYSTEM:  No focal neurologic deficit on gross examination.  Labs on Admission:  Results for orders placed during the hospital encounter of 06/27/12 (from the past 48 hour(s))  COMPREHENSIVE METABOLIC PANEL     Status: Abnormal   Collection Time   06/27/12  9:20 AM      Component Value Range Comment   Sodium 135  135 - 145 mEq/L    Potassium 3.5  3.5 - 5.1 mEq/L    Chloride 102  96 - 112 mEq/L    CO2 25  19 - 32 mEq/L    Glucose, Bld 102 (*) 70 - 99 mg/dL    BUN 19  6 - 23 mg/dL  Creatinine, Ser 1.00  0.50 - 1.35 mg/dL    Calcium 9.0  8.4 - 52.8 mg/dL    Total Protein 5.5 (*) 6.0 - 8.3 g/dL    Albumin 3.4 (*) 3.5 - 5.2 g/dL    AST 23  0 - 37 U/L    ALT 18  0 - 53 U/L    Alkaline Phosphatase 70  39 - 117 U/L    Total Bilirubin 2.1 (*) 0.3 - 1.2 mg/dL    GFR calc non Af Amer 70 (*) >90 mL/min    GFR calc Af Amer 81 (*) >90 mL/min   CBC WITH DIFFERENTIAL     Status: Abnormal   Collection Time   06/27/12  9:20 AM      Component Value Range Comment   WBC 10.9 (*) 4.0 - 10.5 K/uL    RBC 4.71  4.22 - 5.81 MIL/uL    Hemoglobin 13.8  13.0 - 17.0 g/dL    HCT 41.3  24.4 - 01.0 %    MCV 86.6  78.0 - 100.0 fL    MCH 29.3  26.0 - 34.0 pg    MCHC 33.8  30.0 - 36.0 g/dL    RDW 27.2  53.6 - 64.4 %    Platelets 152  150 - 400 K/uL    Neutrophils Relative 84 (*) 43 - 77 %    Neutro Abs 9.2 (*) 1.7 - 7.7 K/uL    Lymphocytes Relative 3 (*) 12 - 46 %    Lymphs Abs 0.3 (*) 0.7 - 4.0 K/uL    Monocytes Relative 13 (*) 3 - 12 %    Monocytes Absolute 1.4 (*) 0.1 - 1.0 K/uL    Eosinophils Relative 0  0 - 5 %    Eosinophils Absolute 0.0  0.0 - 0.7 K/uL    Basophils Relative 0  0 - 1 %     Basophils Absolute 0.0  0.0 - 0.1 K/uL     Radiological Exams on Admission: *RADIOLOGY REPORT*  Clinical Data: Cough, congestion, shortness of breath  CHEST - 2 VIEW  Comparison: 04/13/2011  Findings: Stable elevation of the left hemidiaphragm with  associated left basilar atelectasis / scarring.  Chronic interstitial markings without focal consolidation. No  pleural effusion or pneumothorax.  The heart is top normal in size. Postsurgical changes related to  prior CABG.  Degenerative changes of the visualized thoracolumbar spine.  IMPRESSION:  Chronic left basilar scarring/atelectasis.  No evidence of acute cardiopulmonary disease.  Original Report Authenticated By: Charline Bills, M.D.   Assessment/Plan Active Problems:   1. COPD exacerbation: Patient presented with a 6-day history of productive cough, progressive dysnpea and wheeze, unresponsive to home bronchodilator use. He is still dyspneic, despite 2 bronchodilator nebulizers in the ED. Admitted for management with parenteral steroids, bronchodilators, oxygen supplementation and antibiotics.   2. Acute tracheobronchitis: Patient has a cough, productive of greenish-brown phlegm, which became blood-streaked on 06/26/12,. CXR is devoid of acute findings, and he has central chest pain with coughing. He is afebrile, but had chills this AM and wcc is marginally elevated at 10.9. Clinically, he has a tracheobronchitis, which we shall manage with iv Levaquin, mucolytics, and supportive treatment.  3. Valvular heart disease: Patient has known mild aortic stenosis, moderate mitral stenosis and moderate tricuspid regurgitation, per 2D Echocardiogram of 09/22/11. He follows up with Dr Willa Rough, cardiologist. Clinically, he has no features of CHF at this time, and known EF is 55%-60%.  CAD: Patient has history of mild  CAD. This appears to be stable. We shall continue low dose ASA and beta-blocker. For lipid profile.  5. GERD  (gastroesophageal reflux disease): Asymptomatic oh PPI/H2RA, which we shall continue.  6. BPH (benign prostatic hyperplasia): Not problematic. Follows up with urologist.  Further management will depend on clinical course.  Comment: Patient is FULL CODE.    Time Spent on Admission: 45 mins.   Eathel Pajak,CHRISTOPHER 06/27/2012, 2:15 PM

## 2012-06-27 NOTE — Telephone Encounter (Signed)
Pt decided to go to ER and will call us back if he needs anything.

## 2012-06-27 NOTE — Care Management Note (Signed)
    Page 1 of 2   07/12/2012     3:06:32 PM   CARE MANAGEMENT NOTE 07/12/2012  Patient:  Christian [name], Christian [name]   Account Number:  [id number]  Date Initiated:  06/27/2012  Documentation initiated by:  Lanier Clam  Subjective/Objective Assessment:   ADMITTED W/[id number].ZO:[id number].     Action/Plan:   FROM HOME.HAS PCP-DR.NADEL SCOTT.HAS PHARMACY.   Anticipated DC Date:  07/12/2012   Anticipated DC Plan:  SKILLED NURSING FACILITY      DC Planning Services  CM consult      Choice offered to / List presented to:             Status of service:  Completed, signed off Medicare Important Message given?   (If response is "NO", the following Medicare IM given date fields will be blank) Date Medicare IM given:   Date Additional Medicare IM given:    Discharge Disposition:  SKILLED NURSING FACILITY  Per UR Regulation:  Reviewed for med. necessity/level of care/duration of stay  If discussed at Long Length of Stay Meetings, dates discussed:   07/07/2012    Comments:  07/07/12 [name] RN,BSN NCM [phone] ELEVATED WBC.NECROTIZING PNA,+MRSA,COPD.NEBS,02,PO ABX.NOT APPROPRIATE FOR INPT REHAB.D/C PLAN SNF  [id number] [name], RN, BSN, CCM: CHART REVIEWED AND UPDATED.  Next chart review due on [id number]. NO DISCHARGE NEEDS PRESENT AT THIS TIME. Patient transferred down to sdu on [id number] due to increased wob, and requiring bipapa and flutter vav;e at 4l/min. CASE MANAGEMENT [phone]  [id number] [name], RN, BSN, CCM: CHART REVIEWED AND UPDATED.  Next chart review due on [id number]. NO DISCHARGE NEEDS PRESENT AT THIS TIME. CASE MANAGEMENT [phone] 1 06/27/12 [name] RN,BSN NCM [phone] RECOMMEND HHRN-COPD PROTOCAL.

## 2012-06-27 NOTE — ED Notes (Signed)
Family at bedside. 

## 2012-06-27 NOTE — Progress Notes (Signed)
Nutrition Brief Note  Body mass index is 24.68 kg/(m^2). Pt meets criteria for normal healthy weight based on current BMI.   Current diet order is heart healthy. Pt reports eating 100% of a sandwich today. Labs and medications reviewed. Pt reports eating well PTA, 3 meals/day with great appetite and stable weight.   No nutrition interventions warranted at this time. If nutrition issues arise, please consult RD.   Levon Hedger MS, RD, LDN [phone] Pager [phone] After Hours Pager

## 2012-06-27 NOTE — ED Notes (Signed)
Pt c/o of SOB that started last night. Said he was coughing up sputum then noticed a blood tinge color this morning. Pt also c/o of tingling and dizziness when walking far distances and walking to fast but denies it when sitting down. Denies vomiting but had a little nausea yesterday.

## 2012-06-27 NOTE — Progress Notes (Signed)
PHARMACIST - PHYSICIAN ORDER COMMUNICATION  CONCERNING: P&T Medication Policy on Herbal Medications  DESCRIPTION:  This patient's order for:  Glucosamine-chrondroitin  has been noted.  This product(s) is classified as an "herbal" or natural product. Due to a lack of definitive safety studies or FDA approval, nonstandard manufacturing practices, plus the potential risk of unknown drug-drug interactions while on inpatient medications, the Pharmacy and Therapeutics Committee does not permit the use of "herbal" or natural products of this type within [facility].   ACTION TAKEN: The pharmacy department is unable to verify this order at this time and your patient has been informed of this safety policy. Please reevaluate patient's clinical condition at discharge and address if the herbal or natural product(s) should be resumed at that time.   Thank you, Otho Bellows PharmD 06/27/2012 3:03 PM

## 2012-06-27 NOTE — ED Notes (Signed)
Walk patient down the hall and back patient o2 stats where 90%. Made the doctor aware and I put his back on 2 liters of oxygen when we got back in the room

## 2012-06-27 NOTE — ED Provider Notes (Signed)
History     CSN: [id number]  Arrival date & time 06/27/12  0830   First MD Initiated Contact with Patient 06/27/12 [phone]      Chief Complaint  Patient presents with  . Shortness of Breath    (Consider location/radiation/quality/duration/timing/severity/associated sxs/prior treatment) Patient is a 78 y.o. male presenting with shortness of breath. The history is provided by the patient.  Shortness of Breath  Associated symptoms include shortness of breath.   patient here short of breath x3 days. History of COPD and is to similar. Has been using his home inhaler without relief. He is not on oxygen chronically. Denies fever. Symptoms worse with walking and better with rest. No vomiting or diarrhea. No anginal type chest pain. Denies any abdominal pain. Denies any syncope or near-syncope. Has been hospitalized for similar symptoms in the past.  Past Medical History  Diagnosis Date  . Allergic rhinitis   . COPD (chronic obstructive pulmonary disease)   . Disorders of diaphragm   . Acute bronchitis   . Hypertension   . CAD (coronary artery disease)     Catheterization 2009, mild coronary disease (after abnormal nuclear study,With hypotensive response to exercise, 2009)  . RBBB   . Atrial flutter     Ablated in the past, no recurrence  . Venous insufficiency   . Edema   . Dyslipidemia     Patient has an and to use statin  . GERD (gastroesophageal reflux disease)   . Diverticulosis of colon   . Hypertrophy of prostate with urinary obstruction and other lower urinary tract symptoms (LUTS)   . Increased prostate specific antigen (PSA) velocity   . DJD (degenerative joint disease)   . Chronic insomnia   . Malignant melanoma     Removed from mediastinum via thoracotomy July, 2010  . Ejection fraction     EF 60%,Echo, 2009,  /  EF 55-60%, echo, February, 2013  . Aortic stenosis     Mild, echo and catheterization, 2009 /  Mild, echo, February, 2013  . Mitral stenosis     Very mild,  echo, from mitral annular calcification  . Shortness of breath     Episodes of feeling shortness of breath with some mild dizziness with mild exertion., February, 2013  . Tricuspid regurgitation     Moderate, echo, February, 2013, 56 mmHg  . Pulmonary hypertension     56 mmHg, echo, February, 2013    Past Surgical History  Procedure Date  . Bilateral inguinal hernia repair   . Umbilical hernia repair   . 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  History  Substance Use Topics  . Smoking status: Former Smoker    Quit date: 08/03/1960  . Smokeless tobacco: Never Used  . Alcohol Use: Yes     Comment: social use      Review of Systems  Respiratory: Positive for shortness of breath.   All other systems reviewed and are negative.    Allergies  Ivp dye  Home Medications   Current Outpatient Rx  Name  Route  Sig  Dispense  Refill  . ADVAIR DISKUS 500-50 MCG/DOSE IN AEPB      INHALE 1 PUFF TWICE A DAY   1 each   11   . ALBUTEROL SULFATE HFA 108 (90 BASE) MCG/ACT IN AERS   Inhalation   Inhale 2 puffs into the lungs every 6 (six) hours as needed.           Marland Kitchen  ASPIRIN 325 MG PO TBEC   Oral   Take 325 mg by mouth daily.           . ATENOLOL 25 MG PO TABS      TAKE 1 TABLET BY MOUTH TWO TIMES A DAY   60 tablet   5   . AVODART 0.5 MG PO CAPS      TAKE 1 CAPSULE EVERY DAY   30 capsule   10   . CRANBERRY 500 MG PO CAPS   Oral   Take 1 capsule by mouth daily.           Marland Kitchen DM-GUAIFENESIN ER 30-600 MG PO TB12   Oral   Take 1 tablet by mouth every 12 (twelve) hours.           . ETODOLAC ER 400 MG PO TB24      TAKE 1 TABLET (400 MG TOTAL) BY MOUTH DAILY.   60 tablet   5   . FLUTICASONE PROPIONATE 50 MCG/ACT NA SUSP   Nasal   Place 2 sprays into the nose daily as needed.   16 g   6   . GLUCOSAMINE-CHONDROITIN 500-400 MG PO TABS   Oral   Take 1 tablet by mouth 3 (three) times daily.         Marland Kitchen NEXIUM 40 MG  PO CPDR      TAKE 1 CAPSULE (40 MG TOTAL) BY MOUTH DAILY. 30 MINUTES BEFORE 1ST MEAL OF THE DAY   30 capsule   11   . NYSTATIN 100000 UNIT/ML MT SUSP      Gargle and swallow 1 tsp as directed, as needed   240 mL   5   . POLYETHYLENE GLYCOL 3350 PO PACK   Oral   Take 17 g by mouth daily.           Marland Kitchen RANITIDINE HCL 150 MG PO CAPS      TAKE 1 CAPSULE (150 MG TOTAL) BY MOUTH AT BEDTIME.   30 capsule   10   . TAMSULOSIN HCL 0.4 MG PO CAPS   Oral   Take 0.4 mg by mouth 2 (two) times daily.          . TROSPIUM CHLORIDE 20 MG PO TABS   Oral   Take 20 mg by mouth 2 (two) times daily.           Marland Kitchen ZOLPIDEM TARTRATE 10 MG PO TABS   Oral   Take 1 tablet (10 mg total) by mouth at bedtime as needed.   30 tablet   5     BP 98/55  Pulse 94  Temp 97.6 F (36.4 C) (Oral)  Resp 18  SpO2 97%  Physical Exam  Nursing note and vitals reviewed. Constitutional: He is oriented to person, place, and time. He appears well-developed and well-nourished.  Non-toxic appearance. No distress.  HENT:  Head: Normocephalic and atraumatic.  Eyes: Conjunctivae normal, EOM and lids are normal. Pupils are equal, round, and reactive to light.  Neck: Normal range of motion. Neck supple. No tracheal deviation present. No mass present.  Cardiovascular: Normal rate, regular rhythm and normal heart sounds.  Exam reveals no gallop.   No murmur heard. Pulmonary/Chest: Effort normal. No stridor. No respiratory distress. He has decreased breath sounds. He has wheezes. He has no rhonchi. He has no rales.  Abdominal: Soft. Normal appearance and bowel sounds are normal. He exhibits no distension. There is no tenderness. There is no rebound and no  CVA tenderness.  Musculoskeletal: Normal range of motion. He exhibits no edema and no tenderness.  Neurological: He is alert and oriented to person, place, and time. He has normal strength. No cranial nerve deficit or sensory deficit. GCS eye subscore is 4. GCS  verbal subscore is 5. GCS motor subscore is 6.  Skin: Skin is warm and dry. No abrasion and no rash noted.  Psychiatric: He has a normal mood and affect. His speech is normal and behavior is normal.    ED Course  Procedures (including critical care time)   Labs Reviewed  COMPREHENSIVE METABOLIC PANEL   No results found.   No diagnosis found.    MDM  Pt given albuterol tx x 2 along with steroids and wheezes continue--pt ambulated and desaturated--pt will be admitted by triad  CRITICAL CARE Performed by: Toy Baker   Total critical care time: 60  Critical care time was exclusive of separately billable procedures and treating other patients.  Critical care was necessary to treat or prevent imminent or life-threatening deterioration.  Critical care was time spent personally by me on the following activities: development of treatment plan with patient and/or surrogate as well as nursing, discussions with consultants, evaluation of patient's response to treatment, examination of patient, obtaining history from patient or surrogate, ordering and performing treatments and interventions, ordering and review of laboratory studies, ordering and review of radiographic studies, pulse oximetry and re-evaluation of patient's condition.         Toy Baker, MD 06/27/12 1240

## 2012-06-28 DIAGNOSIS — I4892 Unspecified atrial flutter: Secondary | ICD-10-CM

## 2012-06-28 LAB — CBC
Hemoglobin: 13.5 g/dL (ref 13.0–17.0)
RBC: 4.56 MIL/uL (ref 4.22–5.81)

## 2012-06-28 LAB — COMPREHENSIVE METABOLIC PANEL
CO2: 23 mEq/L (ref 19–32)
Calcium: 9.4 mg/dL (ref 8.4–10.5)
Creatinine, Ser: 1.03 mg/dL (ref 0.50–1.35)
GFR calc Af Amer: 78 mL/min — ABNORMAL LOW (ref >90)
GFR calc non Af Amer: 67 mL/min — ABNORMAL LOW (ref >90)
Glucose, Bld: 172 mg/dL — ABNORMAL HIGH (ref 70–99)

## 2012-06-28 MED ORDER — ATENOLOL 25 MG PO TABS
25.0000 mg | ORAL_TABLET | Freq: Every day | ORAL | Status: DC
  Administered 2012-06-29 – 2012-07-07 (×9): 25 mg via ORAL
  Filled 2012-06-28 (×9): qty 1

## 2012-06-28 MED ORDER — GUAIFENESIN-DM 100-10 MG/5ML PO SYRP
5.0000 mL | ORAL_SOLUTION | ORAL | Status: DC | PRN
  Administered 2012-07-01 – 2012-07-12 (×9): 5 mL via ORAL
  Filled 2012-06-28 (×10): qty 10

## 2012-06-28 MED ORDER — LEVALBUTEROL HCL 0.63 MG/3ML IN NEBU
0.6300 mg | INHALATION_SOLUTION | RESPIRATORY_TRACT | Status: DC
  Administered 2012-06-28 – 2012-07-04 (×30): 0.63 mg via RESPIRATORY_TRACT
  Filled 2012-06-28 (×42): qty 3

## 2012-06-28 MED ORDER — LORAZEPAM 2 MG/ML IJ SOLN
0.5000 mg | Freq: Once | INTRAMUSCULAR | Status: AC
  Administered 2012-06-28: 0.5 mg via INTRAVENOUS
  Filled 2012-06-28: qty 1

## 2012-06-28 MED ORDER — MOMETASONE FURO-FORMOTEROL FUM 100-5 MCG/ACT IN AERO
2.0000 | INHALATION_SPRAY | Freq: Two times a day (BID) | RESPIRATORY_TRACT | Status: DC
  Administered 2012-06-28 – 2012-07-03 (×11): 2 via RESPIRATORY_TRACT
  Filled 2012-06-28: qty 8.8

## 2012-06-28 MED ORDER — IPRATROPIUM BROMIDE 0.02 % IN SOLN
0.5000 mg | RESPIRATORY_TRACT | Status: DC
  Administered 2012-06-28 – 2012-07-03 (×27): 0.5 mg via RESPIRATORY_TRACT
  Filled 2012-06-28 (×19): qty 2.5

## 2012-06-28 NOTE — Progress Notes (Signed)
Pt sleeping at current time, Ativan 0.5mg  adm IV as ordered. Pt's respirations 28-30. Veronia Beets hospitalist aware and writer continue to observe pt closely.

## 2012-06-28 NOTE — Progress Notes (Signed)
Pt slept about 1 1/2 hours after Ativan adm. Awake most of night, states he gets like that when he takes steroids. He has been more comfortable. Will continue to observe.

## 2012-06-28 NOTE — Evaluation (Signed)
Occupational Therapy Evaluation Patient Details Name: Christian [name] MRN: [id number] DOB: 1932/02/16 Today's Date: 06/28/2012 Time: 1026-1100 OT Time Calculation (min): 34 min  OT Assessment / Plan / Recommendation Clinical Impression  Pt presents with COPD exacerbation with history of pulm HTN, CAD, RBBB, and venous insufficiency. Skilled OT indicated to maximize independence with BADLs to supervision level in prep for d/c home.    OT Assessment  Patient needs continued OT Services    Follow Up Recommendations  Home health OT;Supervision/Assistance - 24 hour    Barriers to Discharge      Equipment Recommendations  Rolling walker with 5" wheels    Recommendations for Other Services    Frequency  Min 2X/week    Precautions / Restrictions Precautions Precautions: Fall Restrictions Weight Bearing Restrictions: No   Pertinent Vitals/Pain Denied pain. Completed light ADL activity on RA with sats dropping to mid to high 80s. Recovers quickly with PLB and rest breaks. O2 reapplied at end of session.    ADL  Grooming: Performed;Wash/dry hands;Min guard Where Assessed - Grooming: Supported standing Upper Body Bathing: Simulated;Set up Where Assessed - Upper Body Bathing: Unsupported sitting Lower Body Bathing: Simulated;Min guard Where Assessed - Lower Body Bathing: Supported sit to stand Upper Body Dressing: Simulated;Set up Where Assessed - Upper Body Dressing: Unsupported sitting Lower Body Dressing: Performed;Min guard Where Assessed - Lower Body Dressing: Supported sit to stand (Pt also able to don slippers with setup.) Toilet Transfer Method: Sit to Barista: Comfort height toilet Toileting - Clothing Manipulation and Hygiene: Simulated;Min guard Where Assessed - Engineer, mining and Hygiene: Sit to stand from 3-in-1 or toilet Equipment Used: Rolling walker Transfers/Ambulation Related to ADLs: Pt initially began ambulating without a  device but appeared unsteady. Much steadier when presented with RW. ADL Comments: Pt fatigues quickly. O2 sats drop to high 80s on RA after light activity but recovers quickly with rest and PLB.    OT Diagnosis: Generalized weakness  OT Problem List: Decreased strength;Decreased activity tolerance;Decreased safety awareness;Decreased knowledge of use of DME or AE OT Treatment Interventions: Self-care/ADL training;Therapeutic activities;Energy conservation;Patient/family education;Balance training   OT Goals Acute Rehab OT Goals OT Goal Formulation: With patient/family Time For Goal Achievement: 07/12/12 Potential to Achieve Goals: Good ADL Goals Pt Will Perform Grooming: with supervision;Standing at sink;Sitting at sink ADL Goal: Grooming - Progress: Goal set today Pt Will Transfer to Toilet: with supervision;Ambulation;Regular height toilet ADL Goal: Toilet Transfer - Progress: Goal set today Pt Will Perform Toileting - Clothing Manipulation: with supervision;Sitting on 3-in-1 or toilet;Standing ADL Goal: Toileting - Clothing Manipulation - Progress: Goal set today Pt Will Perform Toileting - Hygiene: with supervision;Sit to stand from 3-in-1/toilet ADL Goal: Toileting - Hygiene - Progress: Goal set today Pt Will Perform Tub/Shower Transfer: Shower transfer;with supervision;Ambulation ADL Goal: Tub/Shower Transfer - Progress: Goal set today Additional ADL Goal #1: Pt will complete all aspects of bathing and dressing at a supervision level initiating prn rest breaks without prompting. ADL Goal: Additional Goal #1 - Progress: Goal set today  Visit Information  Last OT Received On: 06/28/12 Assistance Needed: +1 PT/OT Co-Evaluation/Treatment: Yes    Subjective Data  Subjective: If I were to fall, I would just tuck and roll.. Patient Stated Goal: Not asked.   Prior Functioning     Home Living Lives With: Spouse Available Help at Discharge: Family Type of Home: House Home  Access: Stairs to enter [employer] of Steps: 1 Home Layout: One level;Laundry or work area in  basement Bathroom Shower/Tub: Health visitor: Standard Home Adaptive Equipment: Shower chair with back;Straight cane;Walker - standard;Bedside commode/3-in-1 Prior Function Level of Independence: Independent Able to Take Stairs?: Yes Driving: Yes Vocation: Retired Musician: No difficulties Dominant Hand: Right         Vision/Perception     Cognition  Overall Cognitive Status: Appears within functional limits for tasks assessed/performed Arousal/Alertness: Awake/alert Orientation Level: Appears intact for tasks assessed Behavior During Session: [facility] for tasks performed    Extremity/Trunk Assessment Right Upper Extremity Assessment RUE ROM/Strength/Tone: [facility] for tasks assessed Left Upper Extremity Assessment LUE ROM/Strength/Tone: WFL for tasks assessed Right Lower Extremity Assessment RLE ROM/Strength/Tone: WFL for tasks assessed RLE Sensation: WFL - Light Touch RLE Coordination: WFL - gross/fine motor Left Lower Extremity Assessment LLE ROM/Strength/Tone: WFL for tasks assessed LLE Sensation: WFL - Light Touch LLE Coordination: WFL - gross/fine motor Trunk Assessment Trunk Assessment: Kyphotic     Mobility Bed Mobility Bed Mobility: Not assessed Details for Bed Mobility Assistance: Pt in recliner when PT arrived.  Transfers Sit to Stand: 4: Min guard;With upper extremity assist;With armrests;From chair/3-in-1 Stand to Sit: 5: Supervision;With upper extremity assist;With armrests;To chair/3-in-1 Details for Transfer Assistance: Min/guard for safety with min cues for hand placement.      Shoulder Instructions     Exercise     Balance     End of Session OT - End of Session Activity Tolerance: Patient tolerated treatment well Patient left: in chair;with call bell/phone within reach;with family/visitor present  GO      Kayln Garceau A OTR/L [phone] 06/28/2012, 12:02 PM

## 2012-06-28 NOTE — Evaluation (Signed)
Physical Therapy Evaluation Patient Details Name: Christian [name] MRN: [id number] DOB: [birth year] Today's Date: 06/28/2012 Time: 4098-1191 PT Time Calculation (min): 26 min  PT Assessment / Plan / Recommendation Clinical Impression  Pt presents with COPD exacerbation with history of pulm HTN, CAD, RBBB, and venous insufficiency.  Tolerated ambulation in hallway well, however with increased balance and less work of breathing when using RW vs no AD.  Highly recommend RW at home.  Pt will benefit from skilled PT in acute venue to address deficits.  PT recommends HHPT for follow up at D/C.    PT Assessment  Patient needs continued PT services    Follow Up Recommendations  Home health PT;Supervision/Assistance - 24 hour    Does the patient have the potential to tolerate intense rehabilitation      Barriers to Discharge None      Equipment Recommendations  Rolling walker with 5" wheels (pt has standard walker at home)    Recommendations for Other Services     Frequency Min 3X/week    Precautions / Restrictions Precautions Precautions: Fall Restrictions Weight Bearing Restrictions: No   Pertinent Vitals/Pain No pain      Mobility  Bed Mobility Bed Mobility: Not assessed Details for Bed Mobility Assistance: Pt in recliner when PT arrived.  Transfers Transfers: Sit to Stand;Stand to Sit Sit to Stand: 4: Min guard;With upper extremity assist;With armrests;From chair/3-in-1 Stand to Sit: 5: Supervision;With upper extremity assist;With armrests;To chair/3-in-1 Details for Transfer Assistance: Min/guard for safety with min cues for hand placement.  Ambulation/Gait Ambulation/Gait Assistance: 4: Min guard;4: Min Environmental consultant (Feet): 200 Feet Assistive device: None;Rolling walker Ambulation/Gait Assistance Details: Pt initially ambulated w/o AD, however pt very unsteady and imbalanced.  Had pt ambulate with RW with noted increased stability and decreased work of  breathing.  O2 sats on room air without AD was 88%, ambulating w/ RW was 88-91% with cues for pursed lip breathing.  Gait Pattern: Step-through pattern;Trunk flexed Stairs: No Wheelchair Mobility Wheelchair Mobility: No    Shoulder Instructions     Exercises     PT Diagnosis: Difficulty walking;Generalized weakness  PT Problem List: Decreased strength;Decreased activity tolerance;Decreased balance;Decreased mobility;Decreased knowledge of use of DME;Decreased safety awareness;Cardiopulmonary status limiting activity PT Treatment Interventions: DME instruction;Gait training;Stair training;Functional mobility training;Therapeutic activities;Therapeutic exercise;Balance training;Patient/family education   PT Goals Acute Rehab PT Goals PT Goal Formulation: With patient/family Time For Goal Achievement: 07/12/12 Potential to Achieve Goals: Good Pt will go Supine/Side to Sit: with modified independence PT Goal: Supine/Side to Sit - Progress: Goal set today Pt will go Sit to Supine/Side: with modified independence PT Goal: Sit to Supine/Side - Progress: Goal set today Pt will go Sit to Stand: with modified independence PT Goal: Sit to Stand - Progress: Goal set today Pt will Ambulate: with modified independence;with least restrictive assistive device;>150 feet;Other (comment) (with O2 sats >90%) PT Goal: Ambulate - Progress: Goal set today Pt will Go Up / Down Stairs: 1-2 stairs;with supervision;with least restrictive assistive device PT Goal: Up/Down Stairs - Progress: Goal set today  Visit Information  Last PT Received On: 06/28/12 Assistance Needed: +1    Subjective Data  Subjective: I've only fallen outside at home.  Patient Stated Goal: to get back home.    Prior Functioning  Home Living Lives With: Spouse Available Help at Discharge: Family Type of Home: House Home Access: Stairs to enter [employer] of Steps: 1 Home Layout: One level;Laundry or work area in  basement [employer]: [employer]  shower Bathroom Toilet: Standard Home Adaptive Equipment: Shower chair with back;Straight cane;Walker - standard;Bedside commode/3-in-1 Prior Function Level of Independence: Independent Able to Take Stairs?: Yes Driving: Yes Vocation: Retired Musician: No difficulties Dominant Hand: Right    Cognition  Overall Cognitive Status: Appears within functional limits for tasks assessed/performed Arousal/Alertness: Awake/alert Orientation Level: Appears intact for tasks assessed Behavior During Session: [facility] for tasks performed    Extremity/Trunk Assessment Right Lower Extremity Assessment RLE ROM/Strength/Tone: WFL for tasks assessed RLE Sensation: WFL - Light Touch RLE Coordination: WFL - gross/fine motor Left Lower Extremity Assessment LLE ROM/Strength/Tone: WFL for tasks assessed LLE Sensation: WFL - Light Touch LLE Coordination: WFL - gross/fine motor Trunk Assessment Trunk Assessment: Kyphotic   Balance    End of Session PT - End of Session Activity Tolerance: Patient limited by fatigue Patient left: in chair;with call bell/phone within reach;with nursing in room Nurse Communication: Mobility status  GP     Page, Meribeth Mattes 06/28/2012, 11:31 AM

## 2012-06-28 NOTE — Progress Notes (Signed)
Patient ID: Christian [name]  male  JXB:[id number]    DOB: [birth year]    DOA: 06/27/2012  PCP: Michele Mcalpine, MD  Assessment/Plan:    COPD exacerbation with acute tracheobronchitis: still very wheezy - Cont scheduled Nebs (changed to xopenex and atrovent), IV steroids, will taper tomorrow - Cont levaquin, O2, Flutter valve, added dulera   Valvular heart disease: known mild aortic stenosis, moderate mitral stenosis and moderate tricuspid regurgitation, per 2D Echocardiogram of 09/22/11 EF 55-60% . He follows up with Dr Willa Rough, cardiologist.   GERD (gastroesophageal reflux disease) - cont PPI   BPH (benign prostatic hyperplasia): no issues  DVT Prophylaxis: heparin SQ  Code Status: Full Code  Disposition: hopefully in 2-3 days  Subjective: - Still very wheezy, working with PT, states couldn't sleep much sec to steroids  Objective: Weight change:   Intake/Output Summary (Last 24 hours) at 06/28/12 1150 Last data filed at 06/27/12 2230  Gross per 24 hour  Intake    480 ml  Output    300 ml  Net    180 ml   Blood pressure 110/61, pulse 110, temperature 97.2 F (36.2 C), temperature source Oral, resp. rate 22, height 6' (1.829 m), weight 82.555 kg (182 lb), SpO2 97.00%.  Physical Exam: General: Alert and awake, oriented x3, not in any acute distress. HEENT: anicteric sclera, pupils reactive to light and accommodation, EOMI CVS: S1-S2 clear, no murmur rubs or gallops Chest: b/l exp wheezing Abdomen: soft nontender, nondistended, normal bowel sounds, no organomegaly Extremities: no cyanosis, clubbing or edema noted bilaterally   Lab Results: Basic Metabolic Panel:  Lab 06/28/12 1308 06/27/12 0920  NA 133* 135  K 3.7 3.5  CL 100 102  CO2 23 25  GLUCOSE 172* 102*  BUN 31* 19  CREATININE 1.03 1.00  CALCIUM 9.4 9.0  MG -- --  PHOS -- --   Liver Function Tests:  Lab 06/28/12 0427 06/27/12 0920  AST 27 23  ALT 17 18  ALKPHOS 55 70  BILITOT 1.8* 2.1*  PROT 5.7*  5.5*  ALBUMIN 3.0* 3.4*   No results found for this basename: LIPASE:2,AMYLASE:2 in the last 168 hours No results found for this basename: AMMONIA:2 in the last 168 hours CBC:  Lab 06/28/12 0427 06/27/12 0920  WBC 8.9 10.9*  NEUTROABS -- 9.2*  HGB 13.5 13.8  HCT 39.4 40.8  MCV 86.4 86.6  PLT 131* 152   Cardiac Enzymes: No results found for this basename: CKTOTAL:3,CKMB:3,CKMBINDEX:3,TROPONINI:3 in the last 168 hours BNP: No components found with this basename: POCBNP:2 CBG: No results found for this basename: GLUCAP:5 in the last 168 hours   Micro Results: No results found for this or any previous visit (from the past 240 hour(s)).  Studies/Results: Dg Chest 2 View  06/27/2012  *RADIOLOGY REPORT*  Clinical Data: Cough, congestion, shortness of breath  CHEST - 2 VIEW  Comparison: 04/13/2011  Findings: Stable elevation of the left hemidiaphragm with associated left basilar atelectasis / scarring.  Chronic interstitial markings without focal consolidation. No pleural effusion or pneumothorax.  The heart is top normal in size.  Postsurgical changes related to prior CABG.  Degenerative changes of the visualized thoracolumbar spine.  IMPRESSION: Chronic left basilar scarring/atelectasis.  No evidence of acute cardiopulmonary disease.   Original Report Authenticated By: Charline Bills, M.D.     Medications: Scheduled Meds:   . [COMPLETED] albuterol  10 mg/hr Nebulization Once  . aspirin  325 mg Oral Daily  . atenolol  25 mg Oral  Daily  . darifenacin  7.5 mg Oral Daily  . dextromethorphan-guaiFENesin  1 tablet Oral BID  . dutasteride  0.5 mg Oral Daily  . etodolac  200 mg Oral BID  . famotidine  20 mg Oral QHS  . heparin  5,000 Units Subcutaneous Q8H  . levalbuterol  0.63 mg Nebulization Q4H   And  . ipratropium  0.5 mg Nebulization Q4H  . levofloxacin  750 mg Intravenous Q24H  . [COMPLETED] LORazepam  0.5 mg Intravenous Once  . methylPREDNISolone (SOLU-MEDROL) injection   60 mg Intravenous Q6H  . mometasone-formoterol  2 puff Inhalation BID  . pantoprazole  40 mg Oral Daily  . polyethylene glycol  17 g Oral Daily  . sodium chloride  3 mL Intravenous Q12H  . sodium chloride  3 mL Intravenous Q12H  . Tamsulosin HCl  0.4 mg Oral BID  . [DISCONTINUED] albuterol  2.5 mg Nebulization Q4H  . [DISCONTINUED] atenolol  25 mg Oral BID  . [DISCONTINUED] etodolac  400 mg Oral Daily  . [DISCONTINUED] glucosamine-chondroitin  1 tablet Oral TID  . [DISCONTINUED] ipratropium  0.5 mg Nebulization Q4H      LOS: 1 day   Ayham Word M.D. [facility] 06/28/2012, 11:50 AM Pager: [phone]  If 7PM-7AM, please contact night-coverage [email or web address] Password [email or web address]

## 2012-06-29 MED ORDER — METHYLPREDNISOLONE SODIUM SUCC 125 MG IJ SOLR
60.0000 mg | Freq: Three times a day (TID) | INTRAMUSCULAR | Status: DC
  Administered 2012-06-29 – 2012-07-01 (×6): 60 mg via INTRAVENOUS
  Filled 2012-06-29 (×9): qty 0.96

## 2012-06-29 MED ORDER — LEVOFLOXACIN IN D5W 750 MG/150ML IV SOLN
500.0000 mg | INTRAVENOUS | Status: DC
  Administered 2012-06-29 – 2012-06-30 (×2): 500 mg via INTRAVENOUS
  Filled 2012-06-29 (×3): qty 150

## 2012-06-29 NOTE — Progress Notes (Signed)
Physical Therapy Treatment Patient Details Name: Christian [name] MRN: [id number] DOB: 01/21/1932 Today's Date: 06/29/2012 Time: 3086-5784 PT Time Calculation (min): 19 min  PT Assessment / Plan / Recommendation Comments on Treatment Session  Progressing well. O2 sats still dropping while ambulating on RA. Recommend HHPT.     Follow Up Recommendations  Home health PT;Supervision/Assistance - 24 hour     Does the patient have the potential to tolerate intense rehabilitation     Barriers to Discharge        Equipment Recommendations  Rolling walker with 5" wheels    Recommendations for Other Services    Frequency Min 3X/week   Plan Discharge plan remains appropriate    Precautions / Restrictions Precautions Precautions: Fall Restrictions Weight Bearing Restrictions: No   Pertinent Vitals/Pain No c/o    Mobility  Bed Mobility Bed Mobility: Not assessed Transfers Transfers: Sit to Stand;Stand to Sit Sit to Stand: 5: Supervision;From chair/3-in-1 Stand to Sit: 5: Supervision;To chair/3-in-1 Ambulation/Gait: 300 feet Ambulation/Gait Assistance: 4: Min guard;4: Min Metallurgist Ambulation/Gait Assistance Details: Took a few steps in room without AD, pt still unsteady requiring Min assist to prevent LOB/fall. Used RW in hallway-Min-guard assist. O2 sats 88% on RA.     Exercises     PT Diagnosis:    PT Problem List:   PT Treatment Interventions:     PT Goals Acute Rehab PT Goals Pt will go Sit to Stand: with modified independence PT Goal: Sit to Stand - Progress: Progressing toward goal Pt will Ambulate: >150 feet;with modified independence;with least restrictive assistive device PT Goal: Ambulate - Progress: Progressing toward goal  Visit Information  Last PT Received On: 06/29/12 Assistance Needed: +1    Subjective Data  Subjective: "I'm still getting used to this. I used to being active" Patient Stated Goal: Home   Cognition  Overall Cognitive Status: Appears within functional limits for tasks assessed/performed Arousal/Alertness: Awake/[name] Orientation Level: Appears intact for tasks assessed Behavior During Session: [facility] for tasks performed    Balance     End of Session PT - End of Session Equipment Utilized During Treatment: Gait belt Activity Tolerance: Patient tolerated treatment well Patient left: in chair;with call bell/phone within reach   GP     Christian [name] [name] 06/29/2012, 2:36 PM [phone]

## 2012-06-29 NOTE — Progress Notes (Signed)
Occupational Therapy Treatment Patient Details Name: Christian [name] MRN: [id number] DOB: 1931/08/25 Today's Date: 06/29/2012 Time: 4098-1191 OT Time Calculation (min): 26 min  OT Assessment / Plan / Recommendation Comments on Treatment Session Pt tolerated session very well. He is motivated and is receptive to education provided on energy conservation.    Follow Up Recommendations  Home health OT;Supervision/Assistance - 24 hour    Barriers to Discharge       Equipment Recommendations  Rolling walker with 5" wheels    Recommendations for Other Services    Frequency Min 2X/week   Plan Discharge plan remains appropriate    Precautions / Restrictions Precautions Precautions: Fall Restrictions Weight Bearing Restrictions: No        ADL  Grooming: Performed;Wash/dry face;Teeth care;Supervision/safety;Set up Where Assessed - Grooming: Unsupported sitting Upper Body Bathing: Performed;Chest;Right arm;Left arm;Abdomen;Set up Where Assessed - Upper Body Bathing: Unsupported sitting Lower Body Bathing: Performed;Min guard;Supervision/safety Where Assessed - Lower Body Bathing: Supported sit to stand ADL Comments: Pt's O2 sats during bath on RA down to 88% but up to 92% with rest and PLB Reapplied O2 after bath.. Nursing in room and aware. Pt stood to wash periareas for LB bath; declined needing to wash legs and feet currently. He was supervision to min guard with only very slight unsteadiness noted in standing without device. Pt doing well. Discussed energy conservation techniques to use shower chair instead of standing in shower as well as for increased safety.     OT Diagnosis:    OT Problem List:   OT Treatment Interventions:     OT Goals ADL Goals ADL Goal: Grooming - Progress: Met (for sitting) ADL Goal: Additional Goal #1 - Progress: Progressing toward goals  Visit Information  Last OT Received On: 06/29/12 Assistance Needed: +1    Subjective Data  Subjective: I cant  sleep in that bed Patient Stated Goal: wants to do his wash up   Prior Functioning       Cognition  Overall Cognitive Status: Appears within functional limits for tasks assessed/performed Arousal/Alertness: Awake/alert Orientation Level: Appears intact for tasks assessed Behavior During Session: [facility] for tasks performed    Mobility  Shoulder Instructions Transfers Transfers: Sit to Stand;Stand to Sit Sit to Stand: 5: Supervision;With upper extremity assist;From chair/3-in-1 Stand to Sit: 5: Supervision;With upper extremity assist;To chair/3-in-1       Exercises      Balance Balance Balance Assessed: Yes Dynamic Standing Balance Dynamic Standing - Level of Assistance: 5: Stand by assistance   End of Session OT - End of Session Activity Tolerance: Patient tolerated treatment well Patient left: in chair;with call bell/phone within reach  GO     Lennox Laity [phone] 06/29/2012, 9:45 AM

## 2012-06-29 NOTE — Progress Notes (Signed)
Patient ID: Christian [name]  male  ZOX:[id number]    DOB: 14-Dec-1931    DOA: 06/27/2012  PCP: Michele Mcalpine, MD  Assessment/Plan:    COPD exacerbation with acute tracheobronchitis: still wheezing, but patient reports better than yesterday.  - Cont scheduled Nebs (changed to xopenex and atrovent), IV steroids, will start tapering today.  - Cont levaquin, O2, Flutter valve, added dulera   Valvular heart disease: known mild aortic stenosis, moderate mitral stenosis and moderate tricuspid regurgitation, per 2D Echocardiogram of 09/22/11 EF 55-60% . He follows up with Dr Willa Rough, cardiologist.   GERD (gastroesophageal reflux disease) - cont PPI   BPH (benign prostatic hyperplasia): no issues  DVT Prophylaxis: heparin SQ  Code Status: Full Code  Disposition: hopefully in 2-3 days  Subjective: - Still very wheezy, working with PT,  Very congested this morning, but improved.   Objective: Weight change:   Intake/Output Summary (Last 24 hours) at 06/29/12 1024 Last data filed at 06/29/12 0600  Gross per 24 hour  Intake    630 ml  Output    650 ml  Net    -20 ml   Blood pressure 102/56, pulse 107, temperature 98.1 F (36.7 C), temperature source Oral, resp. rate 18, height 6' (1.829 m), weight 82.555 kg (182 lb), SpO2 88.00%.  Physical Exam: General: Alert and awake, oriented x3, not in any acute distress. HEENT: anicteric sclera, pupils reactive to light and accommodation, EOMI CVS: S1-S2 clear, no murmur rubs or gallops Chest: b/l exp wheezing Abdomen: soft nontender, nondistended, normal bowel sounds, no organomegaly Extremities: no cyanosis, clubbing or edema noted bilaterally   Lab Results: Basic Metabolic Panel:  Lab 06/28/12 8119 06/27/12 0920  NA 133* 135  K 3.7 3.5  CL 100 102  CO2 23 25  GLUCOSE 172* 102*  BUN 31* 19  CREATININE 1.03 1.00  CALCIUM 9.4 9.0  MG -- --  PHOS -- --   Liver Function Tests:  Lab 06/28/12 0427 06/27/12 0920  AST 27 23  ALT  17 18  ALKPHOS 55 70  BILITOT 1.8* 2.1*  PROT 5.7* 5.5*  ALBUMIN 3.0* 3.4*   No results found for this basename: LIPASE:2,AMYLASE:2 in the last 168 hours No results found for this basename: AMMONIA:2 in the last 168 hours CBC:  Lab 06/28/12 0427 06/27/12 0920  WBC 8.9 10.9*  NEUTROABS -- 9.2*  HGB 13.5 13.8  HCT 39.4 40.8  MCV 86.4 86.6  PLT 131* 152   Cardiac Enzymes: No results found for this basename: CKTOTAL:3,CKMB:3,CKMBINDEX:3,TROPONINI:3 in the last 168 hours BNP: No components found with this basename: POCBNP:2 CBG: No results found for this basename: GLUCAP:5 in the last 168 hours   Micro Results: No results found for this or any previous visit (from the past 240 hour(s)).  Studies/Results: Dg Chest 2 View  06/27/2012  *RADIOLOGY REPORT*  Clinical Data: Cough, congestion, shortness of breath  CHEST - 2 VIEW  Comparison: 04/13/2011  Findings: Stable elevation of the left hemidiaphragm with associated left basilar atelectasis / scarring.  Chronic interstitial markings without focal consolidation. No pleural effusion or pneumothorax.  The heart is top normal in size.  Postsurgical changes related to prior CABG.  Degenerative changes of the visualized thoracolumbar spine.  IMPRESSION: Chronic left basilar scarring/atelectasis.  No evidence of acute cardiopulmonary disease.   Original Report Authenticated By: Charline Bills, M.D.     Medications: Scheduled Meds:    . aspirin  325 mg Oral Daily  . atenolol  25 mg  Oral Daily  . darifenacin  7.5 mg Oral Daily  . dextromethorphan-guaiFENesin  1 tablet Oral BID  . dutasteride  0.5 mg Oral Daily  . etodolac  200 mg Oral BID  . famotidine  20 mg Oral QHS  . heparin  5,000 Units Subcutaneous Q8H  . levalbuterol  0.63 mg Nebulization Q4H   And  . ipratropium  0.5 mg Nebulization Q4H  . levofloxacin  750 mg Intravenous Q24H  . methylPREDNISolone (SOLU-MEDROL) injection  60 mg Intravenous Q6H  . mometasone-formoterol  2  puff Inhalation BID  . pantoprazole  40 mg Oral Daily  . polyethylene glycol  17 g Oral Daily  . sodium chloride  3 mL Intravenous Q12H  . sodium chloride  3 mL Intravenous Q12H  . Tamsulosin HCl  0.4 mg Oral BID  . [DISCONTINUED] albuterol  2.5 mg Nebulization Q4H  . [DISCONTINUED] atenolol  25 mg Oral BID  . [DISCONTINUED] ipratropium  0.5 mg Nebulization Q4H      LOS: 2 days   Viktorya Arguijo M.D. [facility] 06/29/2012, 10:24 AM Pager: [phone]  If 7PM-7AM, please contact night-coverage [email or web address] Password [email or web address]

## 2012-06-30 NOTE — Progress Notes (Signed)
Patient ID: Christian [name]  male  EAV:[id number]    DOB: 11-19-1931    DOA: 06/27/2012  PCP: Michele Mcalpine, MD  Assessment/Plan:    COPD exacerbation with acute tracheobronchitis: still wheezing, but patient reports better than yesterday.  - Cont scheduled Nebs (changed to xopenex and atrovent), IV steroids,. - Cont levaquin, O2, Flutter valve, added dulera   Valvular heart disease: known mild aortic stenosis, moderate mitral stenosis and moderate tricuspid regurgitation, per 2D Echocardiogram of 09/22/11 EF 55-60% . He follows up with Dr Willa Rough, cardiologist.   GERD (gastroesophageal reflux disease) - cont PPI   BPH (benign prostatic hyperplasia): no issues  DVT Prophylaxis: heparin SQ  Code Status: Full Code  Disposition: hopefully in 2-3 days  Subjective: - Still very wheezy, working with PT,  . Pt reports improved breathing.   Objective: Weight change:   Intake/Output Summary (Last 24 hours) at 06/30/12 1203 Last data filed at 06/30/12 7829  Gross per 24 hour  Intake   1110 ml  Output      0 ml  Net   1110 ml   Blood pressure 111/65, pulse 87, temperature 97.4 F (36.3 C), temperature source Oral, resp. rate 20, height 6' (1.829 m), weight 82.555 kg (182 lb), SpO2 96.00%.  Physical Exam: General: Alert and awake, oriented x3, not in any acute distress. HEENT: anicteric sclera, pupils reactive to light and accommodation, EOMI CVS: S1-S2 clear, no murmur rubs or gallops Chest: b/l exp wheezing Abdomen: soft nontender, nondistended, normal bowel sounds, no organomegaly Extremities: no cyanosis, clubbing or edema noted bilaterally   Lab Results: Basic Metabolic Panel:  Lab 06/28/12 5621 06/27/12 0920  NA 133* 135  K 3.7 3.5  CL 100 102  CO2 23 25  GLUCOSE 172* 102*  BUN 31* 19  CREATININE 1.03 1.00  CALCIUM 9.4 9.0  MG -- --  PHOS -- --   Liver Function Tests:  Lab 06/28/12 0427 06/27/12 0920  AST 27 23  ALT 17 18  ALKPHOS 55 70  BILITOT 1.8*  2.1*  PROT 5.7* 5.5*  ALBUMIN 3.0* 3.4*   No results found for this basename: LIPASE:2,AMYLASE:2 in the last 168 hours No results found for this basename: AMMONIA:2 in the last 168 hours CBC:  Lab 06/28/12 0427 06/27/12 0920  WBC 8.9 10.9*  NEUTROABS -- 9.2*  HGB 13.5 13.8  HCT 39.4 40.8  MCV 86.4 86.6  PLT 131* 152   Cardiac Enzymes: No results found for this basename: CKTOTAL:3,CKMB:3,CKMBINDEX:3,TROPONINI:3 in the last 168 hours BNP: No components found with this basename: POCBNP:2 CBG: No results found for this basename: GLUCAP:5 in the last 168 hours   Micro Results: No results found for this or any previous visit (from the past 240 hour(s)).  Studies/Results: Dg Chest 2 View  06/27/2012  *RADIOLOGY REPORT*  Clinical Data: Cough, congestion, shortness of breath  CHEST - 2 VIEW  Comparison: 04/13/2011  Findings: Stable elevation of the left hemidiaphragm with associated left basilar atelectasis / scarring.  Chronic interstitial markings without focal consolidation. No pleural effusion or pneumothorax.  The heart is top normal in size.  Postsurgical changes related to prior CABG.  Degenerative changes of the visualized thoracolumbar spine.  IMPRESSION: Chronic left basilar scarring/atelectasis.  No evidence of acute cardiopulmonary disease.   Original Report Authenticated By: Charline Bills, M.D.     Medications: Scheduled Meds:    . aspirin  325 mg Oral Daily  . atenolol  25 mg Oral Daily  . darifenacin  7.5 mg Oral Daily  . dextromethorphan-guaiFENesin  1 tablet Oral BID  . dutasteride  0.5 mg Oral Daily  . etodolac  200 mg Oral BID  . famotidine  20 mg Oral QHS  . heparin  5,000 Units Subcutaneous Q8H  . levalbuterol  0.63 mg Nebulization Q4H   And  . ipratropium  0.5 mg Nebulization Q4H  . levofloxacin  500 mg Intravenous Q24H  . methylPREDNISolone (SOLU-MEDROL) injection  60 mg Intravenous Q8H  . mometasone-formoterol  2 puff Inhalation BID  . pantoprazole   40 mg Oral Daily  . polyethylene glycol  17 g Oral Daily  . sodium chloride  3 mL Intravenous Q12H  . sodium chloride  3 mL Intravenous Q12H  . Tamsulosin HCl  0.4 mg Oral BID  . [DISCONTINUED] levofloxacin  750 mg Intravenous Q24H  . [DISCONTINUED] methylPREDNISolone (SOLU-MEDROL) injection  60 mg Intravenous Q6H      LOS: 3 days   Alisen Marsiglia M.D. [facility] 06/30/2012, 12:03 PM Pager: [phone]  If 7PM-7AM, please contact night-coverage [email or web address] Password [email or web address]

## 2012-07-01 ENCOUNTER — Inpatient Hospital Stay (HOSPITAL_COMMUNITY): Payer: Medicare Other

## 2012-07-01 DIAGNOSIS — I10 Essential (primary) hypertension: Secondary | ICD-10-CM

## 2012-07-01 MED ORDER — LEVOFLOXACIN 500 MG PO TABS
500.0000 mg | ORAL_TABLET | ORAL | Status: DC
  Administered 2012-07-01: 500 mg via ORAL
  Filled 2012-07-01 (×2): qty 1

## 2012-07-01 MED ORDER — METHYLPREDNISOLONE SODIUM SUCC 125 MG IJ SOLR
60.0000 mg | Freq: Two times a day (BID) | INTRAMUSCULAR | Status: DC
  Administered 2012-07-01 – 2012-07-03 (×5): 60 mg via INTRAVENOUS
  Filled 2012-07-01 (×6): qty 0.96

## 2012-07-01 MED ORDER — LORAZEPAM 0.5 MG PO TABS
0.5000 mg | ORAL_TABLET | Freq: Once | ORAL | Status: AC
  Administered 2012-07-01: 0.5 mg via ORAL
  Filled 2012-07-01: qty 1

## 2012-07-01 NOTE — Progress Notes (Signed)
Physical Therapy Treatment Patient Details Name: Christian [name] MRN: [id number] DOB: 07-30-1932 Today's Date: 07/01/2012 Time: 9147-8295 PT Time Calculation (min): 17 min  PT Assessment / Plan / Recommendation Comments on Treatment Session  Pt still requires O2 supplement during act.  Amb on RA sats decreased to 87%.  Pt plans to D/C to home with spouse.    Follow Up Recommendations  Home health PT;Supervision/Assistance - 24 hour     Does the patient have the potential to tolerate intense rehabilitation     Barriers to Discharge        Equipment Recommendations  Rolling walker with 5" wheels    Recommendations for Other Services    Frequency Min 3X/week   Plan Discharge plan remains appropriate    Precautions / Restrictions Precautions Precautions: Fall Precaution Comments: O2  2 lts Restrictions Weight Bearing Restrictions: No   Pertinent Vitals/Pain No c/o pain    Mobility  Bed Mobility Bed Mobility: Not assessed Details for Bed Mobility Assistance: Pt OOB in recliner Transfers Transfers: Sit to Stand;Stand to Sit Sit to Stand: 5: Supervision;From chair/3-in-1 Stand to Sit: 5: Supervision;To chair/3-in-1 Details for Transfer Assistance: <25% VC's on safety as pt is impulsive with caution to O2 tubing Ambulation/Gait Ambulation/Gait Assistance: 5: Supervision;4: Min guard Ambulation Distance (Feet): 225 Feet Assistive device: Rolling walker Ambulation/Gait Assistance Details: Amb on RA sats 87% so reapllied 2 lts nasal sats avg 96%.  Pt states he has a walker at home that belonged to his spouse and he does not usually use anything.  Amb with RW for increased  safety. Gait Pattern: Step-through pattern General Gait Details: 25% VC's safety with turns using RW and safety/caution of O2 tubing     PT Goals                                                              progressing    Visit Information  Last PT Received On: 07/01/12 Assistance Needed: +1      Subjective Data      Cognition       Balance     End of Session PT - End of Session Equipment Utilized During Treatment: Gait belt Activity Tolerance: Patient tolerated treatment well Patient left: in chair;with call bell/phone within reach;with family/visitor present Nurse Communication: Mobility status   Felecia Shelling  PTA [facility]  Acute  Rehab Pager     [phone]

## 2012-07-01 NOTE — Progress Notes (Signed)
Pt has requested a rolling walker for him to take home.  Says he has been using his wife's walker, but it does not have wheels. Please advise and have PT evaluate. Thank you.

## 2012-07-01 NOTE — Progress Notes (Signed)
  PHARMACIST - PHYSICIAN COMMUNICATION DR:   Blake Divine CONCERNING: Antibiotic IV to Oral Route Change Policy  RECOMMENDATION: This patient is receiving Levaquin (Day 5) by the intravenous route.  Based on criteria approved by the Pharmacy and Therapeutics Committee, the antibiotic(s) is/are being converted to the equivalent oral dose form(s).   DESCRIPTION: These criteria include:  Patient being treated for a respiratory tract infection, urinary tract infection, or cellulitis  The patient is not neutropenic and does not exhibit a GI malabsorption state  The patient is eating (either orally or via tube) and/or has been taking other orally administered medications for a least 24 hours  The patient is improving clinically and has a Tmax < 100.5  If you have questions about this conversion, please contact the Pharmacy Department  []   [phone] )  [facility] []   [phone] )  [facility]  []   [phone] )  [facility] [x]   [phone] )  [facility]   Darrol Angel, PharmD Pager: [phone] 07/01/2012 9:22 AM

## 2012-07-01 NOTE — Progress Notes (Signed)
[id number] [name], RN, BSN, CCM: CHART REVIEWED AND UPDATED.  Next chart review due on [id number]. NO DISCHARGE NEEDS PRESENT AT THIS TIME. CASE MANAGEMENT [phone]

## 2012-07-01 NOTE — Progress Notes (Signed)
Patient ID: Christian [name]  male  ZOX:[id number]    DOB: Sep 01, 1931    DOA: 06/27/2012  PCP: Michele Mcalpine, MD  Assessment/Plan:    COPD exacerbation with acute tracheobronchitis: still wheezing, but patient reports better than yesterday.  - Cont scheduled Nebs (changed to xopenex and atrovent), IV steroids,. Wills tart tapering today.  - Cont levaquin, O2, Flutter valve, added dulera   Valvular heart disease: known mild aortic stenosis, moderate mitral stenosis and moderate tricuspid regurgitation, per 2D Echocardiogram of 09/22/11 EF 55-60% . He follows up with Dr Willa Rough, cardiologist.   GERD (gastroesophageal reflux disease) - cont PPI   BPH (benign prostatic hyperplasia): no issues  DVT Prophylaxis: heparin SQ  Code Status: Full Code  Disposition: hopefully in 2-3 days  Subjective: - Still very wheezy, working with PT,  . Pt reports improved breathing.   Objective: Weight change:   Intake/Output Summary (Last 24 hours) at 07/01/12 1634 Last data filed at 07/01/12 1452  Gross per 24 hour  Intake    980 ml  Output    550 ml  Net    430 ml   Blood pressure 102/58, pulse 79, temperature 98.6 F (37 C), temperature source Oral, resp. rate 16, height 6' (1.829 m), weight 82.555 kg (182 lb), SpO2 94.00%.  Physical Exam: General: Alert and awake, oriented x3, not in any acute distress. HEENT: anicteric sclera, pupils reactive to light and accommodation, EOMI CVS: S1-S2 clear, no murmur rubs or gallops Chest: b/l exp wheezing improved.  Abdomen: soft nontender, nondistended, normal bowel sounds, no organomegaly Extremities: no cyanosis, clubbing or edema noted bilaterally   Lab Results: Basic Metabolic Panel:  Lab 06/28/12 8119 06/27/12 0920  NA 133* 135  K 3.7 3.5  CL 100 102  CO2 23 25  GLUCOSE 172* 102*  BUN 31* 19  CREATININE 1.03 1.00  CALCIUM 9.4 9.0  MG -- --  PHOS -- --   Liver Function Tests:  Lab 06/28/12 0427 06/27/12 0920  AST 27 23  ALT 17  18  ALKPHOS 55 70  BILITOT 1.8* 2.1*  PROT 5.7* 5.5*  ALBUMIN 3.0* 3.4*   No results found for this basename: LIPASE:2,AMYLASE:2 in the last 168 hours No results found for this basename: AMMONIA:2 in the last 168 hours CBC:  Lab 06/28/12 0427 06/27/12 0920  WBC 8.9 10.9*  NEUTROABS -- 9.2*  HGB 13.5 13.8  HCT 39.4 40.8  MCV 86.4 86.6  PLT 131* 152   Cardiac Enzymes: No results found for this basename: CKTOTAL:3,CKMB:3,CKMBINDEX:3,TROPONINI:3 in the last 168 hours BNP: No components found with this basename: POCBNP:2 CBG: No results found for this basename: GLUCAP:5 in the last 168 hours   Micro Results: No results found for this or any previous visit (from the past 240 hour(s)).  Studies/Results: Dg Chest 2 View  06/27/2012  *RADIOLOGY REPORT*  Clinical Data: Cough, congestion, shortness of breath  CHEST - 2 VIEW  Comparison: 04/13/2011  Findings: Stable elevation of the left hemidiaphragm with associated left basilar atelectasis / scarring.  Chronic interstitial markings without focal consolidation. No pleural effusion or pneumothorax.  The heart is top normal in size.  Postsurgical changes related to prior CABG.  Degenerative changes of the visualized thoracolumbar spine.  IMPRESSION: Chronic left basilar scarring/atelectasis.  No evidence of acute cardiopulmonary disease.   Original Report Authenticated By: Charline Bills, M.D.     Medications: Scheduled Meds:    . aspirin  325 mg Oral Daily  . atenolol  25  mg Oral Daily  . darifenacin  7.5 mg Oral Daily  . dextromethorphan-guaiFENesin  1 tablet Oral BID  . dutasteride  0.5 mg Oral Daily  . etodolac  200 mg Oral BID  . famotidine  20 mg Oral QHS  . heparin  5,000 Units Subcutaneous Q8H  . levalbuterol  0.63 mg Nebulization Q4H   And  . ipratropium  0.5 mg Nebulization Q4H  . levofloxacin  500 mg Oral Q24H  . methylPREDNISolone (SOLU-MEDROL) injection  60 mg Intravenous Q12H  . mometasone-formoterol  2 puff  Inhalation BID  . pantoprazole  40 mg Oral Daily  . polyethylene glycol  17 g Oral Daily  . sodium chloride  3 mL Intravenous Q12H  . sodium chloride  3 mL Intravenous Q12H  . Tamsulosin HCl  0.4 mg Oral BID  . [DISCONTINUED] levofloxacin  500 mg Intravenous Q24H  . [DISCONTINUED] methylPREDNISolone (SOLU-MEDROL) injection  60 mg Intravenous Q8H      LOS: 4 days   Adilenne Ashworth M.D. [facility] 07/01/2012, 4:34 PM Pager: [phone]  If 7PM-7AM, please contact night-coverage [email or web address] Password [email or web address]

## 2012-07-02 ENCOUNTER — Inpatient Hospital Stay (HOSPITAL_COMMUNITY): Payer: Medicare Other

## 2012-07-02 DIAGNOSIS — D72829 Elevated white blood cell count, unspecified: Secondary | ICD-10-CM

## 2012-07-02 DIAGNOSIS — J189 Pneumonia, unspecified organism: Secondary | ICD-10-CM | POA: Insufficient documentation

## 2012-07-02 LAB — BLOOD GAS, ARTERIAL
Acid-Base Excess: 0.7 mmol/L (ref 0.0–2.0)
Bicarbonate: 26.4 mEq/L — ABNORMAL HIGH (ref 20.0–24.0)
Patient temperature: 37
TCO2: 22.9 mmol/L (ref 0–100)
pH, Arterial: 7.358 (ref 7.350–7.450)

## 2012-07-02 LAB — BASIC METABOLIC PANEL
BUN: 57 mg/dL — ABNORMAL HIGH (ref 6–23)
Chloride: 98 mEq/L (ref 96–112)
Creatinine, Ser: 0.95 mg/dL (ref 0.50–1.35)
GFR calc non Af Amer: 78 mL/min — ABNORMAL LOW (ref >90)
Glucose, Bld: 255 mg/dL — ABNORMAL HIGH (ref 70–99)
Potassium: 5.1 mEq/L (ref 3.5–5.1)

## 2012-07-02 LAB — CBC
HCT: 44.8 % (ref 39.0–52.0)
Hemoglobin: 15.2 g/dL (ref 13.0–17.0)
MCHC: 33.9 g/dL (ref 30.0–36.0)
MCV: 87.3 fL (ref 78.0–100.0)

## 2012-07-02 LAB — GLUCOSE, CAPILLARY

## 2012-07-02 LAB — HEMOGLOBIN A1C: Mean Plasma Glucose: 114 mg/dL (ref <117)

## 2012-07-02 MED ORDER — STARCH (THICKENING) PO POWD
ORAL | Status: DC | PRN
  Filled 2012-07-02: qty 227

## 2012-07-02 MED ORDER — MUPIROCIN 2 % EX OINT
1.0000 "application " | TOPICAL_OINTMENT | Freq: Two times a day (BID) | CUTANEOUS | Status: AC
  Administered 2012-07-02 – 2012-07-06 (×10): 1 via NASAL
  Filled 2012-07-02: qty 22

## 2012-07-02 MED ORDER — CHLORHEXIDINE GLUCONATE CLOTH 2 % EX PADS
6.0000 | MEDICATED_PAD | Freq: Every day | CUTANEOUS | Status: AC
  Administered 2012-07-03 – 2012-07-07 (×5): 6 via TOPICAL

## 2012-07-02 MED ORDER — SODIUM CHLORIDE 0.9 % IV SOLN: INTRAVENOUS | Status: AC

## 2012-07-02 MED ORDER — VANCOMYCIN HCL IN DEXTROSE 1-5 GM/200ML-% IV SOLN
1000.0000 mg | Freq: Two times a day (BID) | INTRAVENOUS | Status: DC
  Administered 2012-07-02 – 2012-07-04 (×5): 1000 mg via INTRAVENOUS
  Filled 2012-07-02 (×5): qty 200

## 2012-07-02 MED ORDER — INSULIN ASPART 100 UNIT/ML ~~LOC~~ SOLN
0.0000 [IU] | Freq: Three times a day (TID) | SUBCUTANEOUS | Status: DC
  Administered 2012-07-02: 2 [IU] via SUBCUTANEOUS
  Administered 2012-07-03 (×3): 1 [IU] via SUBCUTANEOUS

## 2012-07-02 MED ORDER — PIPERACILLIN-TAZOBACTAM 3.375 G IVPB
3.3750 g | Freq: Three times a day (TID) | INTRAVENOUS | Status: DC
  Administered 2012-07-02 – 2012-07-03 (×4): 3.375 g via INTRAVENOUS
  Filled 2012-07-02 (×5): qty 50

## 2012-07-02 NOTE — Procedures (Signed)
Objective Swallowing Evaluation: Modified Barium Swallowing Study  Patient Details  Name: Christian [name] MRN: [id number] Date of Birth: [birth year]  Today's Date: 07/02/2012 Time: 4098-1191 SLP Time Calculation (min): 22 min  Past Medical History:  Past Medical History  Diagnosis Date  . Allergic rhinitis   . COPD (chronic obstructive pulmonary disease)   . Disorders of diaphragm   . Acute bronchitis   . Hypertension   . CAD (coronary artery disease)     Catheterization 2009, mild coronary disease (after abnormal nuclear study,With hypotensive response to exercise, 2009)  . RBBB   . Atrial flutter     Ablated in the past, no recurrence  . Venous insufficiency   . Edema   . Dyslipidemia     Patient has an and to use statin  . GERD (gastroesophageal reflux disease)   . Diverticulosis of colon   . Hypertrophy of prostate with urinary obstruction and other lower urinary tract symptoms (LUTS)   . Increased prostate specific antigen (PSA) velocity   . DJD (degenerative joint disease)   . Chronic insomnia   . Malignant melanoma     Removed from mediastinum via thoracotomy July, 2010  . Ejection fraction     EF 60%,Echo, 2009,  /  EF 55-60%, echo, February, 2013  . Aortic stenosis     Mild, echo and catheterization, 2009 /  Mild, echo, February, 2013  . Mitral stenosis     Very mild, echo, from mitral annular calcification  . Shortness of breath     Episodes of feeling shortness of breath with some mild dizziness with mild exertion., February, 2013  . Tricuspid regurgitation     Moderate, echo, February, 2013, 56 mmHg  . Pulmonary hypertension     56 mmHg, echo, February, 2013   Past Surgical History:  Past Surgical History  Procedure Date  . Bilateral inguinal hernia repair   . Umbilical hernia repair   . Median sternotomy for mediastinal melanoma 1980s   HPI:  78 yr old admitted with SOB with COPD exacerbation.  Initial CXR 11/25 revealed no active disease with repeat  CXR 11/29 revealing airspace disease worrisome for pna, bilateral pleural effusion.  PMH:  GERD, COPD, acute bronchists, HTN, CABG, allergic rhinitis, disorders of diaphragm.  MBS recommended to recommend safest and most efficient po.     Assessment / Plan / Recommendation Clinical Impression  Dysphagia Diagnosis: Mild oral phase dysphagia;Moderate pharyngeal phase dysphagia Clinical impression: Pt. demonstrated mild motor oral dysphagia and moderate motor based pharyngeal dysphagia.  Mildly decreased tongue to palatal contact with mild lingual residuals intermittently observed.  Pharygneal phase described as reduced laryngeal elevation and closure resulting in penetration during and aspiration observed below vocal cords post swallow without sensation.  Pt. did exhibit a significantly delayed cough that may have been due to current pulmonary status versus sensation to barium (?)  Chin tuck technique was attempted, however ineffective in preventing vestibule entrance of barium.  Vallecular (mod) and pyriform sinus (mild-mod) residue present with mild-mod clearance following verbal cues to perform second swallow.  Despite potential for mildly increased of pharyngeal residue with thicker liquids, recommend nectar thick consistency as no penetration observed during swallow with nectar nor penetration/aspiration post swallow from residuals.  Recommend Dys 3 diet primarily for energy conservation.  Pt. likely with chronic increased aspiration risk due to COPD which is heightened when immune system is compromised due to illness.  SLP will continue to follow for safety with diet and further pt. and  family education.    Treatment Recommendation  Therapy as outlined in treatment plan below    Diet Recommendation Dysphagia 3 (Mechanical Soft);Nectar-thick liquid   Liquid Administration via: Cup;No straw Medication Administration: Whole meds with puree Supervision: Patient able to self feed;Full  supervision/cueing for compensatory strategies Compensations: Slow rate;Small sips/bites;Multiple dry swallows after each bite/sip;Follow solids with liquid;Clear throat intermittently Postural Changes and/or Swallow Maneuvers: Seated upright 90 degrees;Upright 30-60 min after meal    Other  Recommendations Recommended Consults: MBS Oral Care Recommendations: Oral care BID Other Recommendations: Order thickener from pharmacy   Follow Up Recommendations   (home health if still needed at discharge)    Frequency and Duration min 2x/week  2 weeks       SLP Swallow Goals Patient will consume recommended diet without observed clinical signs of aspiration with: Minimal cueing Patient will utilize recommended strategies during swallow to increase swallowing safety with: Minimal cueing      Reason for Referral Objectively evaluate swallowing function   Oral Phase Oral Preparation/Oral Phase Oral Phase: Impaired Oral - Nectar Oral - Nectar Cup: Weak lingual manipulation Oral - Solids Oral - Regular: Weak lingual manipulation   Pharyngeal Phase Pharyngeal Phase Pharyngeal Phase: Impaired Pharyngeal - Thin Pharyngeal - Thin Teaspoon: Reduced tongue base retraction;Reduced laryngeal elevation;Penetration/Aspiration during swallow;Pharyngeal residue - valleculae;Pharyngeal residue - pyriform sinuses Penetration/Aspiration details (thin teaspoon): Material enters airway, CONTACTS cords and not ejected out Pharyngeal - Thin Cup: Pharyngeal residue - pyriform sinuses;Pharyngeal residue - valleculae;Reduced laryngeal elevation;Reduced tongue base retraction;Penetration/Aspiration during swallow;Penetration/Aspiration after swallow Penetration/Aspiration details (thin cup): Material enters airway, passes BELOW cords without attempt by patient to eject out (silent aspiration) Pharyngeal - Solids Pharyngeal - Puree: Pharyngeal residue - valleculae;Pharyngeal residue - pyriform sinuses;Reduced  laryngeal elevation;Reduced tongue base retraction Pharyngeal - Regular: Reduced tongue base retraction;Pharyngeal residue - valleculae  Cervical Esophageal Phase    Cervical Esophageal Phase Cervical Esophageal Phase: [facility]        Darrow Bussing.Ed [employer] [phone]  07/02/2012

## 2012-07-02 NOTE — Progress Notes (Signed)
Patient ID: Christian [name]  male  ZOX:[id number]    DOB: Oct 13, 1931    DOA: 06/27/2012  PCP: Michele Mcalpine, MD  Assessment/Plan:    COPD exacerbation with acute tracheobronchitis/ HCAP/ ? Aspiration Pneumonia.  Patient hasn't improved after 3 days of IV steroids. A repeat CXR shows new patchy multifocal pneumonia despite being on levaquin. We have discontinued levaquin and started him on broad spectrum antibiotics vancomycin and zosyn. Sputum cultures were ordered. Patient remains afebrile but he has new leukocytosis. Ordered speech and swallow evaluation to evaluate patient for possible aspiration leading to aspiration pneumonia. Resume steroids, nebs and oxygen and dulera.  His ABG on 3 liters of oxygen looks decent. BIPAP ordered prn for increased work of breathing and exhaustion.    Valvular heart disease: known mild aortic stenosis, moderate mitral stenosis and moderate tricuspid regurgitation, per 2D Echocardiogram of 09/22/11 EF 55-60% . He follows up with Dr Willa Rough, cardiologist.   GERD (gastroesophageal reflux disease) - cont PPI   BPH (benign prostatic hyperplasia): no issues  DVT Prophylaxis: heparin SQ  Code Status: Full Code  Disposition: hopefully in 2-3 days  Subjective: - Still very wheezy, working with PT,  . Pt reports improved breathing.   Objective: Weight change:   Intake/Output Summary (Last 24 hours) at 07/02/12 1221 Last data filed at 07/02/12 1100  Gross per 24 hour  Intake    810 ml  Output    605 ml  Net    205 ml   Blood pressure 138/71, pulse 90, temperature 98 F (36.7 C), temperature source Oral, resp. rate 26, height 6' (1.829 m), weight 82.555 kg (182 lb), SpO2 96.00%.  Physical Exam: General: Alert and awake, oriented x3, not in any acute distress. HEENT: anicteric sclera, pupils reactive to light and accommodation, EOMI CVS: S1-S2 clear, no murmur rubs or gallops Chest: b/l exp wheezing with bilateral scattered rhonchi. Abdomen: soft  nontender, nondistended, normal bowel sounds, no organomegaly Extremities: no cyanosis, clubbing or edema noted bilaterally   Lab Results: Basic Metabolic Panel:  Lab 07/02/12 8119 06/28/12 0427  NA 131* 133*  K 5.1 3.7  CL 98 100  CO2 24 23  GLUCOSE 255* 172*  BUN 57* 31*  CREATININE 0.95 1.03  CALCIUM 9.4 9.4  MG -- --  PHOS -- --   Liver Function Tests:  Lab 06/28/12 0427 06/27/12 0920  AST 27 23  ALT 17 18  ALKPHOS 55 70  BILITOT 1.8* 2.1*  PROT 5.7* 5.5*  ALBUMIN 3.0* 3.4*   No results found for this basename: LIPASE:2,AMYLASE:2 in the last 168 hours No results found for this basename: AMMONIA:2 in the last 168 hours CBC:  Lab 07/02/12 1030 06/28/12 0427 06/27/12 0920  WBC 11.8* 8.9 --  NEUTROABS -- -- 9.2*  HGB 15.2 13.5 --  HCT 44.8 39.4 --  MCV 87.3 86.4 --  PLT 156 131* --   Cardiac Enzymes: No results found for this basename: CKTOTAL:3,CKMB:3,CKMBINDEX:3,TROPONINI:3 in the last 168 hours BNP: No components found with this basename: POCBNP:2 CBG: No results found for this basename: GLUCAP:5 in the last 168 hours   Micro Results: Recent Results (from the past 240 hour(s))  CULTURE, RESPIRATORY     Status: Normal (Preliminary result)   Collection Time   07/01/12 11:20 PM      Component Value Range Status Comment   Specimen Description SPUTUM   Final    Special Requests NONE   Final    Gram Stain  Final    Value: ABUNDANT WBC PRESENT,BOTH PMN AND MONONUCLEAR     MODERATE SQUAMOUS EPITHELIAL CELLS PRESENT     MODERATE GRAM POSITIVE COCCI IN CLUSTERS     IN PAIRS IN CHAINS RARE GRAM POSITIVE RODS   Culture PENDING   Incomplete    Report Status PENDING   Incomplete     Studies/Results: Dg Chest 2 View  06/27/2012  *RADIOLOGY REPORT*  Clinical Data: Cough, congestion, shortness of breath  CHEST - 2 VIEW  Comparison: 04/13/2011  Findings: Stable elevation of the left hemidiaphragm with associated left basilar atelectasis / scarring.  Chronic  interstitial markings without focal consolidation. No pleural effusion or pneumothorax.  The heart is top normal in size.  Postsurgical changes related to prior CABG.  Degenerative changes of the visualized thoracolumbar spine.  IMPRESSION: Chronic left basilar scarring/atelectasis.  No evidence of acute cardiopulmonary disease.   Original Report Authenticated By: Charline Bills, M.D.     Medications: Scheduled Meds:    . aspirin  325 mg Oral Daily  . atenolol  25 mg Oral Daily  . darifenacin  7.5 mg Oral Daily  . dextromethorphan-guaiFENesin  1 tablet Oral BID  . dutasteride  0.5 mg Oral Daily  . etodolac  200 mg Oral BID  . famotidine  20 mg Oral QHS  . heparin  5,000 Units Subcutaneous Q8H  . levalbuterol  0.63 mg Nebulization Q4H   And  . ipratropium  0.5 mg Nebulization Q4H  . [COMPLETED] LORazepam  0.5 mg Oral Once  . methylPREDNISolone (SOLU-MEDROL) injection  60 mg Intravenous Q12H  . mometasone-formoterol  2 puff Inhalation BID  . pantoprazole  40 mg Oral Daily  . piperacillin-tazobactam (ZOSYN)  IV  3.375 g Intravenous Q8H  . polyethylene glycol  17 g Oral Daily  . sodium chloride  3 mL Intravenous Q12H  . sodium chloride  3 mL Intravenous Q12H  . Tamsulosin HCl  0.4 mg Oral BID  . vancomycin  1,000 mg Intravenous Q12H  . [DISCONTINUED] levofloxacin  500 mg Oral Q24H  . [DISCONTINUED] methylPREDNISolone (SOLU-MEDROL) injection  60 mg Intravenous Q8H      LOS: 5 days   Abe Schools M.D. [facility] 07/02/2012, 12:21 PM Pager: [phone]  If 7PM-7AM, please contact night-coverage [email or web address] Password [email or web address]

## 2012-07-02 NOTE — Progress Notes (Signed)
Pt slept a couple of hours after adm of PO Ativan. He woke to urinate and was then very SOB, with moderate sputum production-RT present to adm breathing treatment. Pt continues to be dyspneic with any activity. Will request Chest PT this am. At present pt is using flutter valve to move secretions. Reassured.

## 2012-07-02 NOTE — Progress Notes (Signed)
ANTIBIOTIC CONSULT NOTE - INITIAL  Pharmacy Consult for vancomycin/Zosyn Indication: pneumonia  Allergies  Allergen Reactions  . Ivp Dye (Iodinated Diagnostic Agents)     Patient Measurements: Height: 6' (182.9 cm) Weight: 182 lb (82.555 kg) IBW/kg (Calculated) : 77.6    Vital Signs: Temp: 98 F (36.7 C) (11/30 0526) Temp src: Oral (11/30 0526) BP: 117/65 mmHg (11/30 0526) Pulse Rate: 104  (11/30 0834) Intake/Output from previous day: 11/29 0701 - 11/30 0700 In: 600 [P.O.:600] Out: 500 [Urine:500] Intake/Output from this shift: Total I/O In: 200 [P.O.:200] Out: -   Labs: No results found for this basename: WBC:3,HGB:3,PLT:3,LABCREA:3,CREATININE:3 in the last 72 hours Estimated Creatinine Clearance: 64.9 ml/min (by C-G formula based on Cr of 1.03). No results found for this basename: VANCOTROUGH:2,VANCOPEAK:2,VANCORANDOM:2,GENTTROUGH:2,GENTPEAK:2,GENTRANDOM:2,TOBRATROUGH:2,TOBRAPEAK:2,TOBRARND:2,AMIKACINPEAK:2,AMIKACINTROU:2,AMIKACIN:2, in the last 72 hours   Microbiology: Recent Results (from the past 720 hour(s))  CULTURE, RESPIRATORY     Status: Normal (Preliminary result)   Collection Time   07/01/12 11:20 PM      Component Value Range Status Comment   Specimen Description SPUTUM   Final    Special Requests NONE   Final    Gram Stain     Final    Value: ABUNDANT WBC PRESENT,BOTH PMN AND MONONUCLEAR     MODERATE SQUAMOUS EPITHELIAL CELLS PRESENT     MODERATE GRAM POSITIVE COCCI IN CLUSTERS     IN PAIRS IN CHAINS RARE GRAM POSITIVE RODS   Culture PENDING   Incomplete    Report Status PENDING   Incomplete     Medical History: Past Medical History  Diagnosis Date  . Allergic rhinitis   . COPD (chronic obstructive pulmonary disease)   . Disorders of diaphragm   . Acute bronchitis   . Hypertension   . CAD (coronary artery disease)     Catheterization 2009, mild coronary disease (after abnormal nuclear study,With hypotensive response to exercise, 2009)  .  RBBB   . Atrial flutter     Ablated in the past, no recurrence  . Venous insufficiency   . Edema   . Dyslipidemia     Patient has an and to use statin  . GERD (gastroesophageal reflux disease)   . Diverticulosis of colon   . Hypertrophy of prostate with urinary obstruction and other lower urinary tract symptoms (LUTS)   . Increased prostate specific antigen (PSA) velocity   . DJD (degenerative joint disease)   . Chronic insomnia   . Malignant melanoma     Removed from mediastinum via thoracotomy July, 2010  . Ejection fraction     EF 60%,Echo, 2009,  /  EF 55-60%, echo, February, 2013  . Aortic stenosis     Mild, echo and catheterization, 2009 /  Mild, echo, February, 2013  . Mitral stenosis     Very mild, echo, from mitral annular calcification  . Shortness of breath     Episodes of feeling shortness of breath with some mild dizziness with mild exertion., February, 2013  . Tricuspid regurgitation     Moderate, echo, February, 2013, 56 mmHg  . Pulmonary hypertension     56 mmHg, echo, February, 2013    Medications:  Scheduled:    . aspirin  325 mg Oral Daily  . atenolol  25 mg Oral Daily  . darifenacin  7.5 mg Oral Daily  . dextromethorphan-guaiFENesin  1 tablet Oral BID  . dutasteride  0.5 mg Oral Daily  . etodolac  200 mg Oral BID  . famotidine  20 mg Oral QHS  .  heparin  5,000 Units Subcutaneous Q8H  . levalbuterol  0.63 mg Nebulization Q4H   And  . ipratropium  0.5 mg Nebulization Q4H  . [COMPLETED] LORazepam  0.5 mg Oral Once  . methylPREDNISolone (SOLU-MEDROL) injection  60 mg Intravenous Q12H  . mometasone-formoterol  2 puff Inhalation BID  . pantoprazole  40 mg Oral Daily  . polyethylene glycol  17 g Oral Daily  . sodium chloride  3 mL Intravenous Q12H  . sodium chloride  3 mL Intravenous Q12H  . Tamsulosin HCl  0.4 mg Oral BID  . [DISCONTINUED] levofloxacin  500 mg Intravenous Q24H  . [DISCONTINUED] levofloxacin  500 mg Oral Q24H  . [DISCONTINUED]  methylPREDNISolone (SOLU-MEDROL) injection  60 mg Intravenous Q8H   Infusions:   Assessment: 78 yo male admitted with COPD exacerbation started on levaquin now changing abx's to Vanc and Zosyn for possible HAP  Goal of Therapy:  Vancomycin trough level 15-20 mcg/ml  Plan:  1) Vancomycin 1g IV q12 2) Zosyn 3.375g IV q8 (extended interval infusion)   Hessie Knows, PharmD, BCPS Pager [phone] 07/02/2012 9:06 AM;

## 2012-07-02 NOTE — Evaluation (Signed)
Clinical/Bedside Swallow Evaluation Patient Details  Name: Christian [name] MRN: [id number] Date of Birth: 04/04/1932  Today's Date: 07/02/2012 Time: 2130-8657 SLP Time Calculation (min): 22 min  Past Medical History:  Past Medical History  Diagnosis Date  . Allergic rhinitis   . COPD (chronic obstructive pulmonary disease)   . Disorders of diaphragm   . Acute bronchitis   . Hypertension   . CAD (coronary artery disease)     Catheterization 2009, mild coronary disease (after abnormal nuclear study,With hypotensive response to exercise, 2009)  . RBBB   . Atrial flutter     Ablated in the past, no recurrence  . Venous insufficiency   . Edema   . Dyslipidemia     Patient has an and to use statin  . GERD (gastroesophageal reflux disease)   . Diverticulosis of colon   . Hypertrophy of prostate with urinary obstruction and other lower urinary tract symptoms (LUTS)   . Increased prostate specific antigen (PSA) velocity   . DJD (degenerative joint disease)   . Chronic insomnia   . Malignant melanoma     Removed from mediastinum via thoracotomy July, 2010  . Ejection fraction     EF 60%,Echo, 2009,  /  EF 55-60%, echo, February, 2013  . Aortic stenosis     Mild, echo and catheterization, 2009 /  Mild, echo, February, 2013  . Mitral stenosis     Very mild, echo, from mitral annular calcification  . Shortness of breath     Episodes of feeling shortness of breath with some mild dizziness with mild exertion., February, 2013  . Tricuspid regurgitation     Moderate, echo, February, 2013, 56 mmHg  . Pulmonary hypertension     56 mmHg, echo, February, 2013   Past Surgical History:  Past Surgical History  Procedure Date  . Bilateral inguinal hernia repair   . Umbilical hernia repair   . Median sternotomy for mediastinal melanoma 1980s   HPI:  78 yr old admitted with SOB with COPD exacerbation.  Initial CXR 11/25 revealed no active disease with repeat CXR 11/29 revealing airspace  disease worrisome for pna, bilateral pleural effusion.  PMH:  GERD, COPD, acute bronchists, HTN, CABG, allergic rhinitis, disorders of diaphram.   Assessment / Plan / Recommendation Clinical Impression  Pt. very congested sounding prior to adminstered.  Pt. appears weak with decreased endurance and easily fatigued.  Cup sips water were followed by delayed throat clears indicative of possible decreased airway protection.  Given risk factor of COPD in addition to lethargy, clinical observations, history of GERD and reports of coughing with meals prior to this admit, recommend objective swallow assessment with MBS.     Aspiration Risk  Moderate    Diet Recommendation  (will recommend after MBS (pt. leaving for study present time)   Compensations:  (frequent rest breaks if needed)    Other  Recommendations Recommended Consults: MBS   Follow Up Recommendations   (to be determined)                 SLP Swallow Goals Patient will consume recommended diet without observed clinical signs of aspiration with: Minimal cueing Patient will utilize recommended strategies during swallow to increase swallowing safety with: Minimal cueing   Swallow Study Prior Functional Status          Oral/Motor/Sensory Function Overall Oral Motor/Sensory Function: Appears within functional limits for tasks assessed   Ice Chips Ice chips: Not tested   Thin Liquid Thin Liquid: Impaired  Presentation: Cup Pharyngeal  Phase Impairments: Throat Clearing - Delayed    Nectar Thick Nectar Thick Liquid: Not tested   Honey Thick Honey Thick Liquid: Not tested   Puree Puree: Within functional limits   Solid       Solid: Within functional limits       Royce Macadamia M.Ed [employer] [phone]  07/02/2012

## 2012-07-03 DIAGNOSIS — J852 Abscess of lung without pneumonia: Secondary | ICD-10-CM

## 2012-07-03 DIAGNOSIS — J850 Gangrene and necrosis of lung: Secondary | ICD-10-CM | POA: Diagnosis present

## 2012-07-03 DIAGNOSIS — J690 Pneumonitis due to inhalation of food and vomit: Secondary | ICD-10-CM | POA: Insufficient documentation

## 2012-07-03 LAB — CBC
HCT: 43.7 % (ref 39.0–52.0)
MCHC: 33.4 g/dL (ref 30.0–36.0)
MCV: 87.9 fL (ref 78.0–100.0)
RDW: 13.8 % (ref 11.5–15.5)

## 2012-07-03 LAB — GLUCOSE, CAPILLARY
Glucose-Capillary: 122 mg/dL — ABNORMAL HIGH (ref 70–99)
Glucose-Capillary: 128 mg/dL — ABNORMAL HIGH (ref 70–99)
Glucose-Capillary: 107 mg/dL — ABNORMAL HIGH (ref 70–99)

## 2012-07-03 LAB — BASIC METABOLIC PANEL
BUN: 60 mg/dL — ABNORMAL HIGH (ref 6–23)
Creatinine, Ser: 0.93 mg/dL (ref 0.50–1.35)
GFR calc Af Amer: >90 mL/min (ref >90)
GFR calc non Af Amer: 78 mL/min — ABNORMAL LOW (ref >90)
Glucose, Bld: 137 mg/dL — ABNORMAL HIGH (ref 70–99)

## 2012-07-03 MED ORDER — ACETYLCYSTEINE 20 % IN SOLN
2.0000 mL | Freq: Four times a day (QID) | RESPIRATORY_TRACT | Status: DC
  Administered 2012-07-03 (×2): 2 mL via RESPIRATORY_TRACT
  Filled 2012-07-03 (×7): qty 30

## 2012-07-03 MED ORDER — AMOXICILLIN-POT CLAVULANATE 875-125 MG PO TABS
1.0000 | ORAL_TABLET | Freq: Two times a day (BID) | ORAL | Status: DC
  Administered 2012-07-03 – 2012-07-12 (×19): 1 via ORAL
  Filled 2012-07-03 (×20): qty 1

## 2012-07-03 NOTE — Progress Notes (Signed)
Patient ID: Christian [name]  male  AVW:[id number]    DOB: [birth year]    DOA: 06/27/2012  PCP: Christian Mcalpine, MD  Assessment/Plan:    COPD exacerbation with acute tracheobronchitis/ HCAP/ ? Aspiration Pneumonia.  Patient hasn't improved after 3 days of IV steroids. A repeat CXR shows new patchy multifocal pneumonia despite being on levaquin. We have discontinued levaquin and started him on broad spectrum antibiotics vancomycin and zosyn. Sputum cultures were ordered. Patient remains afebrile but he has new leukocytosis. Ordered speech and swallow evaluation to evaluate patient for possible aspiration leading to aspiration pneumonia. Resume steroids, nebs and oxygen and dulera.  His ABG on 3 liters of oxygen looks decent. BIPAP ordered prn for increased work of breathing and exhaustion. A CT chest done showed extensive bilateral cavitary pneumonia with areas of necrosis with endobronchial material occluding the left lower lobe bronchus. Pulmonary consult called to see if he needs bronchoscopy. He is currently on vancomycin and zosyn. His sputum cultures show moderate staph aureus with pending sensitivities.   Valvular heart disease: known mild aortic stenosis, moderate mitral stenosis and moderate tricuspid regurgitation, per 2D Echocardiogram of 09/22/11 EF 55-60% . He follows up with Dr Christian [name], cardiologist.   GERD (gastroesophageal reflux disease) - cont PPI   BPH (benign prostatic hyperplasia): no issues  DVT Prophylaxis: heparin SQ  Code Status: Full Code  Disposition: pending.   Subjective:   Objective: Weight change:   Intake/Output Summary (Last 24 hours) at 07/03/12 1100 Last data filed at 07/03/12 0900  Gross per 24 hour  Intake   1350 ml  Output    950 ml  Net    400 ml   Blood pressure 135/84, pulse 91, temperature 96.4 F (35.8 C), temperature source Axillary, resp. rate 23, height 6' (1.829 m), weight 82.555 kg (182 lb), SpO2 94.00%.  Physical Exam: General: Alert  and awake, oriented x3, not in any acute distress. HEENT: anicteric sclera, pupils reactive to light and accommodation, EOMI CVS: S1-S2 clear, no murmur rubs or gallops Chest: b/l exp wheezing with bilateral scattered rhonchi. Abdomen: soft nontender, nondistended, normal bowel sounds, no organomegaly Extremities: no cyanosis, clubbing or edema noted bilaterally   Lab Results: Basic Metabolic Panel:  Lab 07/03/12 8295 07/02/12 1030  NA 132* 131*  K 5.1 5.1  CL 99 98  CO2 28 24  GLUCOSE 137* 255*  BUN 60* 57*  CREATININE 0.93 0.95  CALCIUM 9.3 9.4  MG -- --  PHOS -- --   Liver Function Tests:  Lab 06/28/12 0427 06/27/12 0920  AST 27 23  ALT 17 18  ALKPHOS 55 70  BILITOT 1.8* 2.1*  PROT 5.7* 5.5*  ALBUMIN 3.0* 3.4*   No results found for this basename: LIPASE:2,AMYLASE:2 in the last 168 hours No results found for this basename: AMMONIA:2 in the last 168 hours CBC:  Lab 07/03/12 0358 07/02/12 1030 06/27/12 0920  WBC 11.8* 11.8* --  NEUTROABS -- -- 9.2*  HGB 14.6 15.2 --  HCT 43.7 44.8 --  MCV 87.9 87.3 --  PLT 137* 156 --   Cardiac Enzymes: No results found for this basename: CKTOTAL:3,CKMB:3,CKMBINDEX:3,TROPONINI:3 in the last 168 hours BNP: No components found with this basename: POCBNP:2 CBG:  Lab 07/03/12 0827 07/03/12 0402 07/02/12 2346 07/02/12 2034 07/02/12 1713  GLUCAP 126* 122* 151* 129* 160*     Micro Results: Recent Results (from the past 240 hour(s))  CULTURE, RESPIRATORY     Status: Normal (Preliminary result)   Collection Time  07/01/12 11:20 PM      Component Value Range Status Comment   Specimen Description SPUTUM   Final    Special Requests NONE   Final    Gram Stain     Final    Value: ABUNDANT WBC PRESENT,BOTH PMN AND MONONUCLEAR     MODERATE SQUAMOUS EPITHELIAL CELLS PRESENT     MODERATE GRAM POSITIVE COCCI IN CLUSTERS     IN PAIRS IN CHAINS RARE GRAM POSITIVE RODS   Culture     Final    Value: MODERATE STAPHYLOCOCCUS AUREUS      Note: RIFAMPIN AND GENTAMICIN SHOULD NOT BE USED AS SINGLE DRUGS FOR TREATMENT OF STAPH INFECTIONS.   Report Status PENDING   Incomplete   MRSA PCR SCREENING     Status: Abnormal   Collection Time   07/02/12 10:58 AM      Component Value Range Status Comment   MRSA by PCR POSITIVE (*) NEGATIVE Final     Studies/Results: Dg Chest 2 View  06/27/2012  *RADIOLOGY REPORT*  Clinical Data: Cough, congestion, shortness of breath  CHEST - 2 VIEW  Comparison: 04/13/2011  Findings: Stable elevation of the left hemidiaphragm with associated left basilar atelectasis / scarring.  Chronic interstitial markings without focal consolidation. No pleural effusion or pneumothorax.  The heart is top normal in size.  Postsurgical changes related to prior CABG.  Degenerative changes of the visualized thoracolumbar spine.  IMPRESSION: Chronic left basilar scarring/atelectasis.  No evidence of acute cardiopulmonary disease.   Original Report Authenticated By: Charline Bills, M.D.     Medications: Scheduled Meds:    . aspirin  325 mg Oral Daily  . atenolol  25 mg Oral Daily  . Chlorhexidine Gluconate Cloth  6 each Topical Q0600  . darifenacin  7.5 mg Oral Daily  . dextromethorphan-guaiFENesin  1 tablet Oral BID  . dutasteride  0.5 mg Oral Daily  . etodolac  200 mg Oral BID  . famotidine  20 mg Oral QHS  . insulin aspart  0-9 Units Subcutaneous TID WC  . levalbuterol  0.63 mg Nebulization Q4H   And  . ipratropium  0.5 mg Nebulization Q4H  . methylPREDNISolone (SOLU-MEDROL) injection  60 mg Intravenous Q12H  . mometasone-formoterol  2 puff Inhalation BID  . mupirocin ointment  1 application Nasal BID  . pantoprazole  40 mg Oral Daily  . piperacillin-tazobactam (ZOSYN)  IV  3.375 g Intravenous Q8H  . polyethylene glycol  17 g Oral Daily  . sodium chloride  3 mL Intravenous Q12H  . sodium chloride  3 mL Intravenous Q12H  . Tamsulosin HCl  0.4 mg Oral BID  . vancomycin  1,000 mg Intravenous Q12H  .  [DISCONTINUED] heparin  5,000 Units Subcutaneous Q8H      LOS: 6 days   Christian [name] M.D. [facility] 07/03/2012, 11:00 AM Pager: [phone]  If 7PM-7AM, please contact night-coverage [email or web address] Password [email or web address]

## 2012-07-03 NOTE — Consult Note (Signed)
PULMONARY/CCM CONSULT NOTE  Requesting MD/Service: TR[facility]/Akula Date of admission: 11/25 Date of consult: 12/1 Reason for consultation: necrotizing PNA  Pt Profile: 28M pt of SN with hx of COPD adm 11/25 to [facility] service with 5 days of increased SOB, purulent sputum, bilateral infiltrates and diagnosis of PNA. His sputum cx is positive for MRSA. He has been properly treated with abx and is symptomatically improved. CT chest performed 11/30 demonstrated bilateral lower lobe infiltrates and necrosis. PCCM asked to assess whether there is a role for FOB and to assist in further eval and mgmt  Patient Active Problem List  Diagnosis  . INSOMNIA, CHRONIC  . ESSENTIAL HYPERTENSION  . BRONCHITIS, ACUTE  . ALLERGIC RHINITIS  . DISORDERS OF DIAPHRAGM  . GERD  . DIVERTICULOSIS OF COLON  . CONSTIPATION  . HYPERTROPHY PROSTATE W/UR OBST & OTH LUTS  . DEGENERATIVE JOINT DISEASE  . EDEMA  . PSA, INCREASED  . CONTUSION OF LOWER LEG  . COPD (chronic obstructive pulmonary disease)  . CAD (coronary artery disease)  . RBBB  . Atrial flutter  . Venous insufficiency  . Dyslipidemia  . Malignant melanoma  . Mitral stenosis  . Shortness of breath  . Ejection fraction  . Aortic stenosis  . Tricuspid regurgitation  . Pulmonary hypertension  . COPD exacerbation  . Acute tracheobronchitis  . Valvular heart disease  . GERD (gastroesophageal reflux disease)  . BPH (benign prostatic hyperplasia)  . HCAP (healthcare-associated pneumonia)  . Leukocytosis  . Aspiration pneumonia  . Necrotizing pneumonia, community acquired MRSA       HPI: As above. Pt indicates that he feels markedly better than on admission. Hemoptysis has resolved. He still has copious secretions with some difficulty mobilizing them. He still requires [place] O2 @ 4 lpm. He presently denies CP, hemoptysis, LE edema and calf tenderness. He has indicated to other care providers that he wished to be NCB/DNI. I did not discuss this directly with  him but did address with family members after my initia eval   Past Medical History  Diagnosis Date  . Allergic rhinitis   . COPD (chronic obstructive pulmonary disease)   . Disorders of diaphragm   . Acute bronchitis   . Hypertension   . CAD (coronary artery disease)     Catheterization 2009, mild coronary disease (after abnormal nuclear study,With hypotensive response to exercise, 2009)  . RBBB   . Atrial flutter     Ablated in the past, no recurrence  . Venous insufficiency   . Edema   . Dyslipidemia     Patient has an and to use statin  . GERD (gastroesophageal reflux disease)   . Diverticulosis of colon   . Hypertrophy of prostate with urinary obstruction and other lower urinary tract symptoms (LUTS)   . Increased prostate specific antigen (PSA) velocity   . DJD (degenerative joint disease)   . Chronic insomnia   . Malignant melanoma     Removed from mediastinum via thoracotomy July, 2010  . Ejection fraction     EF 60%,Echo, 2009,  /  EF 55-60%, echo, February, 2013  . Aortic stenosis     Mild, echo and catheterization, 2009 /  Mild, echo, February, 2013  . Mitral stenosis     Very mild, echo, from mitral annular calcification  . Shortness of breath     Episodes of feeling shortness of breath with some mild dizziness with mild exertion., February, 2013  . Tricuspid regurgitation     Moderate, echo, February,  2013, 56 mmHg  . Pulmonary hypertension     56 mmHg, echo, February, 2013    MEDICATIONS: reviewed  History   Social History  . Marital Status: Married    Spouse Name: evelyn Heuring    Number of Children: 6  . Years of Education: N/A   Occupational History  . 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 Smoking status: Former Smoker    Quit date: 08/03/1960  . Smokeless tobacco: Never Used  . Alcohol Use: Yes     Comment: social use  . Drug Use: No  . Sexually Active: No   Other Topics Concern  . Not on file   Social History  Narrative   2 biological children and 4  adopted children    History reviewed. No pertinent family history.  ROS - as per HPI, otherwise negative  Filed Vitals:   07/03/12 1250 07/03/12 1400 07/03/12 1600 07/03/12 1800  BP: 112/54 117/71 102/66 123/80  Pulse: 78 81 76 84  Temp:   97.5 F (36.4 C)   TempSrc:   Oral   Resp: 19 17 21 24   Height:      Weight:      SpO2: 96% 95% 92% 92%    EXAM:  Gen: Pleasant, no overt distress but frequent rattling cough HEENT: WNL Neck: No JVD Lungs: Diffuse rhonchi bilaterally Cardiovascular: RRR s M Abdomen: soft, NT, NABS Ext: No C/C/E Neuro: no focal deficits   DATA:  BMET    Component Value Date/Time   NA 132* 07/03/2012 0358   K 5.1 07/03/2012 0358   CL 99 07/03/2012 0358   CO2 28 07/03/2012 0358   GLUCOSE 137* 07/03/2012 0358   GLUCOSE 91 07/20/2006 0738   BUN 60* 07/03/2012 0358   CREATININE 0.93 07/03/2012 0358   CALCIUM 9.3 07/03/2012 0358   GFRNONAA 78* 07/03/2012 0358   GFRAA >90 07/03/2012 0358    CBC    Component Value Date/Time   WBC 11.8* 07/03/2012 0358   RBC 4.97 07/03/2012 0358   HGB 14.6 07/03/2012 0358   HCT 43.7 07/03/2012 0358   PLT 137* 07/03/2012 0358   MCV 87.9 07/03/2012 0358   MCH 29.4 07/03/2012 0358   MCHC 33.4 07/03/2012 0358   RDW 13.8 07/03/2012 0358   LYMPHSABS 0.3* 06/27/2012 0920   MONOABS 1.4* 06/27/2012 0920   EOSABS 0.0 06/27/2012 0920   BASOSABS 0.0 06/27/2012 0920   CXR: B AS dz,  L > R CT chest: BLL, L>R, consolidation and necrosis  RADIOLOGY:   IMPRESSION:   Principal Problem:  *Necrotizing pneumonia, community acquired MRSA Active Problems:  COPD (chronic obstructive pulmonary disease) Abnormal MBS - possible aspiration Mucus retention  PLAN:  -Agree with current abx for now -Will need prolonged course (4-6 wks depending on clinical and radiographic resolution) of antimicrobial therapy given extensive necrosis -I think he will need both anaerobic coverage and therapy directed @  MRSA  - Linezolid and Augmentin would be reasonable options  - Can likely change to oral therapy in next day or two -No indication for FOB @ this time -I have ordered chest percussion vest and N-acetylcysteine to facilitate mucus clearance -I have D/C Ipratropium as this can theoretically increase viscosity of secretions and make them more difficult to expectorate -I have D/C'd systemic steroids in setting of acute bacterial infection and in absence of wheezing -He will likely need home O2 after discharge - possibly permanently -I will let Dr Kriste Basque  know that he is here -I did discuss code status briefly with family at their request and I indicated that there would be no medical reason to withhold intubation in this setting if her were to deteriorate   Billy Fischer, MD ; [facility] service Mobile [phone].  After 5:30 PM or weekends, call [phone]

## 2012-07-03 NOTE — Progress Notes (Signed)
eLink Physician-Brief Progress Note Patient Name: Christian [name] DOB: 27-Sep-1931 MRN: [id number]  Date of Service  07/03/2012   HPI/Events of Note  Cultures reviewed; sputum pos for staph aureus; screening pos for MRSA   eICU Interventions  The patient on Vanc; final sensitivities pending; no intervention needed.       Mylin Gignac 07/03/2012, 5:01 PM

## 2012-07-04 LAB — CULTURE, RESPIRATORY W GRAM STAIN

## 2012-07-04 LAB — BASIC METABOLIC PANEL
CO2: 27 mEq/L (ref 19–32)
Chloride: 99 mEq/L (ref 96–112)
Creatinine, Ser: 0.95 mg/dL (ref 0.50–1.35)
GFR calc Af Amer: >90 mL/min (ref >90)
Potassium: 5 mEq/L (ref 3.5–5.1)
Sodium: 132 mEq/L — ABNORMAL LOW (ref 135–145)

## 2012-07-04 LAB — GLUCOSE, CAPILLARY

## 2012-07-04 LAB — CBC
MCV: 87.5 fL (ref 78.0–100.0)
Platelets: 139 10*3/uL — ABNORMAL LOW (ref 150–400)
RBC: 4.89 MIL/uL (ref 4.22–5.81)
WBC: 17.6 10*3/uL — ABNORMAL HIGH (ref 4.0–10.5)

## 2012-07-04 MED ORDER — LINEZOLID 600 MG PO TABS
600.0000 mg | ORAL_TABLET | Freq: Two times a day (BID) | ORAL | Status: DC
  Administered 2012-07-04 – 2012-07-12 (×17): 600 mg via ORAL
  Filled 2012-07-04 (×18): qty 1

## 2012-07-04 MED ORDER — LEVALBUTEROL HCL 0.63 MG/3ML IN NEBU
0.6300 mg | INHALATION_SOLUTION | Freq: Four times a day (QID) | RESPIRATORY_TRACT | Status: DC
  Administered 2012-07-04 – 2012-07-10 (×24): 0.63 mg via RESPIRATORY_TRACT
  Filled 2012-07-04 (×31): qty 3

## 2012-07-04 MED ORDER — ACETYLCYSTEINE 20 % IN SOLN
2.0000 mL | Freq: Four times a day (QID) | RESPIRATORY_TRACT | Status: AC
  Administered 2012-07-04 – 2012-07-05 (×4): 2 mL via RESPIRATORY_TRACT
  Filled 2012-07-04 (×4): qty 4

## 2012-07-04 MED ORDER — MORPHINE SULFATE 2 MG/ML IJ SOLN
2.0000 mg | Freq: Once | INTRAMUSCULAR | Status: AC
  Administered 2012-07-04: 2 mg via INTRAVENOUS
  Filled 2012-07-04: qty 1

## 2012-07-04 MED ORDER — OXYCODONE HCL 5 MG PO TABS
5.0000 mg | ORAL_TABLET | ORAL | Status: DC | PRN
  Administered 2012-07-05 – 2012-07-12 (×12): 5 mg via ORAL
  Filled 2012-07-04 (×13): qty 1

## 2012-07-04 NOTE — Progress Notes (Addendum)
Patient ID: Christian [name]  male  ZOX:[id number]    DOB: 1932/04/29    DOA: 06/27/2012  PCP: Michele Mcalpine, MD  Assessment/Plan:    COPD exacerbation with acute tracheobronchitis/ HCAP/ ? Aspiration Pneumonia.  Patient hasn't improved after 3 days of IV steroids. A repeat CXR shows new patchy multifocal pneumonia despite being on levaquin. We have discontinued levaquin and started him on broad spectrum antibiotics vancomycin and zosyn. Sputum cultures were ordered. Patient remains afebrile but he has new leukocytosis. Ordered speech and swallow evaluation to evaluate patient for possible aspiration leading to aspiration pneumonia. Resume steroids, nebs and oxygen and dulera.  His ABG on 3 liters of oxygen looks decent. BIPAP ordered prn for increased work of breathing and exhaustion. A CT chest done showed extensive bilateral cavitary pneumonia with areas of necrosis with endobronchial material occluding the left lower lobe bronchus. Pulmonary consult called to see if he needs bronchoscopy. His sputum cultures show moderate staph aureus with pending sensitivities. Appreciate pulmonary input. Antibiotics were changed to oral augmentin and zyvox for polymicrobial coverage in addition to chest percussions. Plan to D/C to telemetry by the end of the day.    Valvular heart disease: known mild aortic stenosis, moderate mitral stenosis and moderate tricuspid regurgitation, per 2D Echocardiogram of 09/22/11 EF 55-60% . He follows up with Dr Willa Rough, cardiologist.   GERD (gastroesophageal reflux disease) - cont PPI   BPH (benign prostatic hyperplasia): no issues  Moderate pharyngeal Dysphagia: Swallow eval recommended dys phagia 3 diet with  Nectar thickened liquid  DVT Prophylaxis: heparin SQ  Code Status: Full Code  Disposition: pending.   Subjective: Feels same as yesterday. Still on 3 liters of oxygen. OOB to chair   Objective: Weight change:   Intake/Output Summary (Last 24 hours) at  07/04/12 1222 Last data filed at 07/04/12 1200  Gross per 24 hour  Intake   1500 ml  Output    950 ml  Net    550 ml   Blood pressure 138/75, pulse 90, temperature 97.9 F (36.6 C), temperature source Oral, resp. rate 27, height 6' (1.829 m), weight 82.555 kg (182 lb), SpO2 90.00%.  Physical Exam: General: Alert and awake, oriented x3, not in any acute distress. HEENT: anicteric sclera, pupils reactive to light and accommodation, EOMI CVS: S1-S2 clear, no murmur rubs or gallops Chest:  exp wheezing> ont he left when compared to right. with bilateral scattered rhonchi. Abdomen: soft nontender, nondistended, normal bowel sounds, no organomegaly Extremities: no cyanosis, clubbing or edema noted bilaterally   Lab Results: Basic Metabolic Panel:  Lab 07/04/12 8119 07/03/12 0358  NA 132* 132*  K 5.0 5.1  CL 99 99  CO2 27 28  GLUCOSE 122* 137*  BUN 63* 60*  CREATININE 0.95 0.93  CALCIUM 9.0 9.3  MG -- --  PHOS -- --   Liver Function Tests:  Lab 06/28/12 0427  AST 27  ALT 17  ALKPHOS 55  BILITOT 1.8*  PROT 5.7*  ALBUMIN 3.0*   No results found for this basename: LIPASE:2,AMYLASE:2 in the last 168 hours No results found for this basename: AMMONIA:2 in the last 168 hours CBC:  Lab 07/04/12 0345 07/03/12 0358  WBC 17.6* 11.8*  NEUTROABS -- --  HGB 14.3 14.6  HCT 42.8 43.7  MCV 87.5 87.9  PLT 139* 137*   Cardiac Enzymes: No results found for this basename: CKTOTAL:3,CKMB:3,CKMBINDEX:3,TROPONINI:3 in the last 168 hours BNP: No components found with this basename: POCBNP:2 CBG:  Lab 07/04/12 1129  07/04/12 0742 07/03/12 2124 07/03/12 1632 07/03/12 1203  GLUCAP 164* 115* 107* 128* 123*     Micro Results: Recent Results (from the past 240 hour(s))  CULTURE, RESPIRATORY     Status: Normal   Collection Time   07/01/12 11:20 PM      Component Value Range Status Comment   Specimen Description SPUTUM   Final    Special Requests NONE   Final    Gram Stain     Final     Value: ABUNDANT WBC PRESENT,BOTH PMN AND MONONUCLEAR     MODERATE SQUAMOUS EPITHELIAL CELLS PRESENT     MODERATE GRAM POSITIVE COCCI IN CLUSTERS     IN PAIRS IN CHAINS RARE GRAM POSITIVE RODS   Culture     Final    Value: MODERATE METHICILLIN RESISTANT STAPHYLOCOCCUS AUREUS     Note: RIFAMPIN AND GENTAMICIN SHOULD NOT BE USED AS SINGLE DRUGS FOR TREATMENT OF STAPH INFECTIONS. This organism DOES NOT demonstrate inducible Clindamycin resistance in vitro. CRITICAL RESULT CALLED TO, READ BACK BY AND VERIFIED WITH: REEVES RN 9AM      07/04/12 GUSTK   Report Status 07/04/2012 FINAL   Final    Organism ID, Bacteria METHICILLIN RESISTANT STAPHYLOCOCCUS AUREUS   Final   MRSA PCR SCREENING     Status: Abnormal   Collection Time   07/02/12 10:58 AM      Component Value Range Status Comment   MRSA by PCR POSITIVE (*) NEGATIVE Final     Studies/Results: Dg Chest 2 View  06/27/2012  *RADIOLOGY REPORT*  Clinical Data: Cough, congestion, shortness of breath  CHEST - 2 VIEW  Comparison: 04/13/2011  Findings: Stable elevation of the left hemidiaphragm with associated left basilar atelectasis / scarring.  Chronic interstitial markings without focal consolidation. No pleural effusion or pneumothorax.  The heart is top normal in size.  Postsurgical changes related to prior CABG.  Degenerative changes of the visualized thoracolumbar spine.  IMPRESSION: Chronic left basilar scarring/atelectasis.  No evidence of acute cardiopulmonary disease.   Original Report Authenticated By: Charline Bills, M.D.     Medications: Scheduled Meds:    . acetylcysteine  2 mL Nebulization Q6H  . amoxicillin-clavulanate  1 tablet Oral BID  . aspirin  325 mg Oral Daily  . atenolol  25 mg Oral Daily  . Chlorhexidine Gluconate Cloth  6 each Topical Q0600  . darifenacin  7.5 mg Oral Daily  . dutasteride  0.5 mg Oral Daily  . etodolac  200 mg Oral BID  . famotidine  20 mg Oral QHS  . levalbuterol  0.63 mg Nebulization Q6H    . linezolid  600 mg Oral Q12H  . mupirocin ointment  1 application Nasal BID  . pantoprazole  40 mg Oral Daily  . polyethylene glycol  17 g Oral Daily  . sodium chloride  3 mL Intravenous Q12H  . sodium chloride  3 mL Intravenous Q12H  . Tamsulosin HCl  0.4 mg Oral BID  . [DISCONTINUED] acetylcysteine  2 mL Nebulization QID  . [DISCONTINUED] dextromethorphan-guaiFENesin  1 tablet Oral BID  . [DISCONTINUED] insulin aspart  0-9 Units Subcutaneous TID WC  . [DISCONTINUED] levalbuterol  0.63 mg Nebulization Q4H  . [DISCONTINUED] methylPREDNISolone (SOLU-MEDROL) injection  60 mg Intravenous Q12H  . [DISCONTINUED] vancomycin  1,000 mg Intravenous Q12H      LOS: 7 days   Christian [name] M.D. [facility] 07/04/2012, 12:22 PM Pager: [phone]  If 7PM-7AM, please contact night-coverage [email or web address] Password [email or web address]

## 2012-07-04 NOTE — Progress Notes (Signed)
Occupational Therapy Treatment Patient Details Name: Christian [name] MRN: [id number] DOB: 1932/01/05 Today's Date: 07/04/2012 Time: 4098-1191 OT Time Calculation (min): 18 min  OT Assessment / Plan / Recommendation Comments on Treatment Session Pt's activity tolerance declining since being transferred to stepdown. Pt does continue to be pleasant and cooperative.    Follow Up Recommendations  Home health OT;Supervision/Assistance - 24 hour    Barriers to Discharge       Equipment Recommendations  Rolling walker with 5" wheels    Recommendations for Other Services    Frequency Min 2X/week   Plan Discharge plan remains appropriate    Precautions / Restrictions Precautions Precautions: Fall Precaution Comments: Monitor O2, on 3L Restrictions Weight Bearing Restrictions: No   Pertinent Vitals/Pain Pt denied pain. O2 sats dropped to 87% with light activity.    ADL  Toilet Transfer: Simulated;Minimal assistance Toilet Transfer Method: Sit to stand Toilet Transfer Equipment: Other (comment) (recliner.) Transfers/Ambulation Related to ADLs: Pt ambulated ~60 feet before becoming fatigued. Chair brought up behind pt. ADL Comments: Session limited 2* pt fatigue. About to be transferred to another floor.    OT Diagnosis:    OT Problem List:   OT Treatment Interventions:     OT Goals ADL Goals ADL Goal: Toilet Transfer - Progress: Progressing toward goals  Visit Information  Last OT Received On: 07/04/12 Assistance Needed: +1 PT/OT Co-Evaluation/Treatment: Yes    Subjective Data  Subjective: Their moving me upstairs today..   Prior Functioning       Cognition  Overall Cognitive Status: Appears within functional limits for tasks assessed/performed Arousal/Alertness: Awake/alert Orientation Level: Appears intact for tasks assessed Behavior During Session: [facility] for tasks performed    Mobility  Shoulder Instructions Bed Mobility Bed Mobility: Supine to Sit Supine to Sit:  5: Supervision Details for Bed Mobility Assistance: Supervision for safety.  Transfers Sit to Stand: 5: Supervision;With upper extremity assist;From bed Stand to Sit: 5: Supervision;With armrests;With upper extremity assist;To chair/3-in-1 Details for Transfer Assistance: Min cues for safety due to pt standing before therapists ready and for hand placement.        Exercises      Balance     End of Session OT - End of Session Activity Tolerance: Patient limited by fatigue Patient left: in chair;with call bell/phone within reach;with family/visitor present  GO     Mireille Lacombe A OTR/L [phone] 07/04/2012, 2:49 PM

## 2012-07-04 NOTE — Progress Notes (Signed)
CARE MANAGEMENT NOTE 07/04/2012  Patient:  JARIS, KOHLES   Account Number:  [id number]  Date Initiated:  06/27/2012  Documentation initiated by:  [facility]  Subjective/Objective Assessment:   ADMITTED W/SOB.ZO:XWRU.     Action/Plan:   FROM HOME.HAS PCP-DR.NADEL SCOTT.HAS PHARMACY.   Anticipated DC Date:  07/07/2012   Anticipated DC Plan:  HOME W HOME HEALTH SERVICES      DC Planning Services  CM consult      Choice offered to / List presented to:             Status of service:   Medicare Important Message given?   (If response is "NO", the following Medicare IM given date fields will be blank) Date Medicare IM given:   Date Additional Medicare IM given:    Discharge Disposition:    Per UR Regulation:  Reviewed for med. necessity/level of care/duration of stay  If discussed at Long Length of Stay Meetings, dates discussed:    Comments:  [id number]/Yehya Brendle [name], RN, BSN, CCM: CHART REVIEWED AND UPDATED.  Next chart review due on [id number]. NO DISCHARGE NEEDS PRESENT AT THIS TIME. Patient transferred down to sdu on [id number] due to increased wob, and requiring bipapa and flutter vav;e at 4l/min. CASE MANAGEMENT [phone]  [id number] [name], RN, BSN, CCM: CHART REVIEWED AND UPDATED.  Next chart review due on [id number]. NO DISCHARGE NEEDS PRESENT AT THIS TIME. CASE MANAGEMENT [phone] 1 06/27/12 KATHY MAHABIR RN,BSN NCM [phone] RECOMMEND HHRN-COPD PROTOCAL.

## 2012-07-04 NOTE — Progress Notes (Signed)
Physical Therapy Treatment Patient Details Name: Christian [name] MRN: [id number] DOB: [birth year] Today's Date: 07/04/2012 Time: 7829-5621 PT Time Calculation (min): 15 min  PT Assessment / Plan / Recommendation Comments on Treatment Session  Pt now in ICU with necrotizing PNA, however continues to progress with mobility and requires min/guard when ambulating for safety.  O2 sats dropped to 87% on 3L O2 when ambulating.  RN made aware. Continue to encourage pt to cough up any loose mucous when he can.     Follow Up Recommendations  Home health PT;Supervision/Assistance - 24 hour     Does the patient have the potential to tolerate intense rehabilitation     Barriers to Discharge        Equipment Recommendations  Rolling walker with 5" wheels    Recommendations for Other Services    Frequency Min 3X/week   Plan Discharge plan remains appropriate    Precautions / Restrictions Precautions Precautions: Fall Precaution Comments: Monitor O2, on 3L Restrictions Weight Bearing Restrictions: No   Pertinent Vitals/Pain No pain, states 4/10 RPE with ambulation.     Mobility  Bed Mobility Bed Mobility: Supine to Sit Supine to Sit: 5: Supervision Details for Bed Mobility Assistance: Supervision for safety.  Transfers Transfers: Sit to Stand;Stand to Sit Sit to Stand: 5: Supervision;With upper extremity assist;From bed Stand to Sit: 5: Supervision;With armrests;With upper extremity assist;To chair/3-in-1 Details for Transfer Assistance: Min cues for safety due to pt standing before therapists ready and for hand placement.  Ambulation/Gait Ambulation/Gait Assistance: 4: Min guard Ambulation Distance (Feet): 65 Feet Assistive device: Rolling walker Ambulation/Gait Assistance Details: Min cues for maintaining proper position inside of RW and to continue pursed lip breathing with ambulation.  Ambulated on 3L O2 with O2 sats dropping to 87%.  Sats back to 90's upon resting in room.  Gait  Pattern: Step-through pattern    Exercises     PT Diagnosis:    PT Problem List:   PT Treatment Interventions:     PT Goals Acute Rehab PT Goals PT Goal Formulation: With patient/family Time For Goal Achievement: 07/12/12 Potential to Achieve Goals: Good Pt will go Supine/Side to Sit: with modified independence PT Goal: Supine/Side to Sit - Progress: Progressing toward goal Pt will go Sit to Stand: with modified independence PT Goal: Sit to Stand - Progress: Progressing toward goal Pt will Ambulate: >150 feet;with modified independence;with least restrictive assistive device PT Goal: Ambulate - Progress: Progressing toward goal  Visit Information  Last PT Received On: 07/04/12 Assistance Needed: +1    Subjective Data  Subjective: I guess (in response to getting up with therapy) Patient Stated Goal: Home   Cognition  Overall Cognitive Status: Appears within functional limits for tasks assessed/performed Arousal/Alertness: Awake/alert Orientation Level: Appears intact for tasks assessed Behavior During Session: [facility] for tasks performed    Balance     End of Session PT - End of Session Equipment Utilized During Treatment: Oxygen Activity Tolerance: Patient limited by fatigue Patient left: in chair;with call bell/phone within reach;with family/visitor present Nurse Communication: Mobility status   GP     Page, Meribeth Mattes 07/04/2012, 2:36 PM

## 2012-07-04 NOTE — Consult Note (Signed)
PULMONARY/CCM NOTE  Requesting MD/Service: TRH/Akula Date of admission: 11/25 Date of consult: 12/1 Reason for consultation: necrotizing PNA  Pt Profile: 54M pt of SN with hx of COPD adm 11/25 to [facility] service with 5 days of increased SOB, purulent sputum, bilateral infiltrates and diagnosis of PNA. His sputum cx is positive for MRSA. He has been properly treated with abx and is symptomatically improved. CT chest performed 11/30 demonstrated bilateral lower lobe infiltrates and necrosis. PCCM asked to assess whether there is a role for FOB and to assist in further eval and mgmt    Filed Vitals:   07/04/12 0421 07/04/12 0500 07/04/12 0600 07/04/12 0800  BP:   125/74   Pulse:  81 80   Temp:    97.5 F (36.4 C)  TempSrc:    Oral  Resp:  17 19   Height:      Weight:      SpO2: 94% 96% 95%     EXAM:  Gen: Mild rest dyspnea, Cough less wet, still frequent and vigorous HEENT: WNL Neck: No JVD Lungs: Diffuse rhonchi bilaterally - improved Cardiovascular: RRR s M Abdomen: soft, NT, NABS Ext: No C/C/E Neuro: no focal deficits   DATA:  BMET    Component Value Date/Time   NA 132* 07/04/2012 0345   K 5.0 07/04/2012 0345   CL 99 07/04/2012 0345   CO2 27 07/04/2012 0345   GLUCOSE 122* 07/04/2012 0345   GLUCOSE 91 07/20/2006 0738   BUN 63* 07/04/2012 0345   CREATININE 0.95 07/04/2012 0345   CALCIUM 9.0 07/04/2012 0345   GFRNONAA 78* 07/04/2012 0345   GFRAA >90 07/04/2012 0345    CBC    Component Value Date/Time   WBC 17.6* 07/04/2012 0345   RBC 4.89 07/04/2012 0345   HGB 14.3 07/04/2012 0345   HCT 42.8 07/04/2012 0345   PLT 139* 07/04/2012 0345   MCV 87.5 07/04/2012 0345   MCH 29.2 07/04/2012 0345   MCHC 33.4 07/04/2012 0345   RDW 13.8 07/04/2012 0345   LYMPHSABS 0.3* 06/27/2012 0920   MONOABS 1.4* 06/27/2012 0920   EOSABS 0.0 06/27/2012 0920   BASOSABS 0.0 06/27/2012 0920     CXR: No new film   IMPRESSION:   Principal Problem:  *Necrotizing pneumonia, community acquired  MRSA Active Problems:  COPD (chronic obstructive pulmonary disease) Abnormal MBS - possible aspiration Mucus retention  PLAN:  -Will change abx to PO - linezolid and Amox/clav. He will need coverage for MRSA but I think this is likely a polymicrobial infection and would like him on broader therapy to include anareobic coverage -Will need prolonged course (4-6 wks depending on clinical and radiographic resolution) of antimicrobial therapy given extensive necrosis -Cont chest percussion vest and N-acetylcysteine to facilitate mucus clearance -Cont nebulized steroids and beta agonist -He will likely need home O2 after discharge - possibly permanently -Begin PT   Appears ready to go to Tele bed Leave PICC for now but would remove in a couple of days if he tolerates transition to oral abx We will continue to follow.   Billy Fischer, MD ; [facility] [phone].  After 5:30 PM or weekends, call [phone]

## 2012-07-04 NOTE — Progress Notes (Signed)
Speech Language Pathology Dysphagia Treatment Patient Details Name: Christian [name] MRN: [id number] DOB: 09/21/1931 Today's Date: 07/04/2012 Time: 1308-6578 SLP Time Calculation (min): 26 min  Assessment / Plan / Recommendation Clinical Impression  Pt seen for dietary tolerance and family education.  Son Christian [name] present and both son and pt educated to findings of MBS and purpose of compensatory strategies, recommendations.  Provided written copies of precaution signs.  Pt lethargic today, frequently falling asleep during SLP visit, which he states is normal since admit.  Pt observed to swish and expectorate with water with overt coughing immediatlely after:  suspect this is secondary to aspiration, premature spillage of water into airway.  Pt states this did not occur at home prior to admit.  Next observed pt with nectar liquids with him self feeding without any clinical indicators of aspiration.    Son reports intake poor and pt states he does not have an appetite. Note evaluating therapist from Saturday documented pt report of coughing with meals at home prior to admit.  Pt complains of inability to get mucus up, using chest PT and flutter valve for assist.    Rec continue soft/nectar diet with strict adherence to precautions to maximize airway protection.  Asp risk will be ongoing given pt's COPD, deconditioned state and known dysphagia.  Pt and son agreeable to plan.  Will follow to aid in dysphagia management.        Diet Recommendation  Continue with Current Diet: Dysphagia 3 (mechanical soft);Nectar-thick liquid    SLP Plan Continue with current plan of care   Pertinent Vitals/Pain Afebrile, decr    Swallowing Goals  SLP Swallowing Goals Patient will consume recommended diet without observed clinical signs of aspiration with: Other (comment) Swallow Study Goal #1 - Progress: Discontinued (comment) (pt coughing with po prior to admission) Swallow Study Goal #3 - Progress: Progressing  toward goal  General Temperature Spikes Noted: No Respiratory Status: Supplemental O2 delivered via (comment) Behavior/Cognition: Lethargic;Pleasant mood;Cooperative Oral Cavity - Dentition: Adequate natural dentition Patient Positioning: Upright in chair  Oral Cavity - Oral Hygiene     Dysphagia Treatment Treatment focused on: Skilled observation of diet tolerance;Patient/family/caregiver education Family/Caregiver Educated: son Christian [name] Treatment Methods/Modalities: Skilled observation Patient observed directly with PO's: Yes Type of PO's observed: Nectar-thick liquids;Thin liquids Feeding: Total assist Liquids provided via: Cup Pharyngeal Phase Signs & Symptoms:  (overt cough when pt rinsed and expectorated water) Type of cueing: Verbal (to follow compensatory strategies) Amount of cueing: Minimal   GO     Donavan Burnet, MS [facility] SLP [phone]

## 2012-07-04 NOTE — Progress Notes (Signed)
ANTIBIOTIC CONSULT NOTE - Follow Up  Pharmacy Consult for Vancomycin Indication: pneumonia  Allergies  Allergen Reactions  . Ivp Dye (Iodinated Diagnostic Agents)     Patient Measurements: Height: 6' (182.9 cm) Weight: 182 lb (82.555 kg) IBW/kg (Calculated) : 77.6    Vital Signs: Temp: 97.5 F (36.4 C) (12/02 0800) Temp src: Oral (12/02 0800) BP: 125/74 mmHg (12/02 0600) Pulse Rate: 80  (12/02 0600) Intake/Output from previous day: 12/01 0701 - 12/02 0700 In: 1550 [I.V.:1100; IV Piggyback:450] Out: 800 [Urine:800] Intake/Output from this shift:    Labs:  Basename 07/04/12 0345 07/03/12 0358 07/02/12 1030  WBC 17.6* 11.8* 11.8*  HGB 14.3 14.6 15.2  PLT 139* 137* 156  LABCREA -- -- --  CREATININE 0.95 0.93 0.95   Estimated Creatinine Clearance: 70.3 ml/min (by C-G formula based on Cr of 0.95).  65 ml/min/1.71m2 (normalized)   Microbiology: Recent Results (from the past 720 hour(s))  CULTURE, RESPIRATORY     Status: Normal   Collection Time   07/01/12 11:20 PM      Component Value Range Status Comment   Specimen Description SPUTUM   Final    Special Requests NONE   Final    Gram Stain     Final    Value: ABUNDANT WBC PRESENT,BOTH PMN AND MONONUCLEAR     MODERATE SQUAMOUS EPITHELIAL CELLS PRESENT     MODERATE GRAM POSITIVE COCCI IN CLUSTERS     IN PAIRS IN CHAINS RARE GRAM POSITIVE RODS   Culture     Final    Value: MODERATE METHICILLIN RESISTANT STAPHYLOCOCCUS AUREUS     Note: RIFAMPIN AND GENTAMICIN SHOULD NOT BE USED AS SINGLE DRUGS FOR TREATMENT OF STAPH INFECTIONS. This organism DOES NOT demonstrate inducible Clindamycin resistance in vitro. CRITICAL RESULT CALLED TO, READ BACK BY AND VERIFIED WITH: REEVES RN 9AM      07/04/12 GUSTK   Report Status 07/04/2012 FINAL   Final    Organism ID, Bacteria METHICILLIN RESISTANT STAPHYLOCOCCUS AUREUS   Final   MRSA PCR SCREENING     Status: Abnormal   Collection Time   07/02/12 10:58 AM      Component Value Range  Status Comment   MRSA by PCR POSITIVE (*) NEGATIVE Final     Medications:  Scheduled:     . acetylcysteine  2 mL Nebulization QID  . amoxicillin-clavulanate  1 tablet Oral BID  . aspirin  325 mg Oral Daily  . atenolol  25 mg Oral Daily  . Chlorhexidine Gluconate Cloth  6 each Topical Q0600  . darifenacin  7.5 mg Oral Daily  . dextromethorphan-guaiFENesin  1 tablet Oral BID  . dutasteride  0.5 mg Oral Daily  . etodolac  200 mg Oral BID  . famotidine  20 mg Oral QHS  . insulin aspart  0-9 Units Subcutaneous TID WC  . levalbuterol  0.63 mg Nebulization Q4H  . mupirocin ointment  1 application Nasal BID  . pantoprazole  40 mg Oral Daily  . polyethylene glycol  17 g Oral Daily  . sodium chloride  3 mL Intravenous Q12H  . sodium chloride  3 mL Intravenous Q12H  . Tamsulosin HCl  0.4 mg Oral BID  . vancomycin  1,000 mg Intravenous Q12H  . [DISCONTINUED] ipratropium  0.5 mg Nebulization Q4H  . [DISCONTINUED] methylPREDNISolone (SOLU-MEDROL) injection  60 mg Intravenous Q12H  . [DISCONTINUED] mometasone-formoterol  2 puff Inhalation BID  . [DISCONTINUED] piperacillin-tazobactam (ZOSYN)  IV  3.375 g Intravenous Q8H   Assessment:  78 yo male admitted 11/25 with COPD exacerbation, started on levaquin  CXR 11/30 noted extensive bilateral cavitary pneumonia with areas of necrosis, therefore changed to Vanc/Zosyn  Zosyn changed to Augmentin on 12/1, IV Vancomycin continues (today is day #3)  Tm 99.9, renal function stable, WBC elevated with previous steroids  Sputum culture +MRSA on 11/29  Goal of Therapy:  Vancomycin trough level 15-20 mcg/ml  Plan:   Check vancomycin trough tonight  F/U transition to PO - CCM mentions linezolid  Continue augmentin per ID Follow up renal function & cultures   Loralee Pacas, PharmD, BCPS Pager: [phone] 07/04/2012 11:15 AM;

## 2012-07-05 DIAGNOSIS — K219 Gastro-esophageal reflux disease without esophagitis: Secondary | ICD-10-CM

## 2012-07-05 DIAGNOSIS — J9621 Acute and chronic respiratory failure with hypoxia: Secondary | ICD-10-CM | POA: Diagnosis present

## 2012-07-05 DIAGNOSIS — R0902 Hypoxemia: Secondary | ICD-10-CM

## 2012-07-05 DIAGNOSIS — J9620 Acute and chronic respiratory failure, unspecified whether with hypoxia or hypercapnia: Secondary | ICD-10-CM

## 2012-07-05 MED ORDER — BOOST / RESOURCE BREEZE PO LIQD
1.0000 | Freq: Two times a day (BID) | ORAL | Status: DC
  Administered 2012-07-06 – 2012-07-12 (×13): 1 via ORAL

## 2012-07-05 NOTE — Progress Notes (Signed)
INITIAL ADULT NUTRITION ASSESSMENT Date: 07/05/2012   Time: 3:52 PM Reason for Assessment: MAOI  INTERVENTION: 1.  Meals/snacks; encouraged intake, assisted with dinner order 2.  Supplements; Resource Breeze BID   DOCUMENTATION CODES Per approved criteria  -Not Applicable    ASSESSMENT: Male 78 y.o.  Dx: Necrotizing pneumonia  Hx:  Past Medical History  Diagnosis Date  . Allergic rhinitis   . COPD (chronic obstructive pulmonary disease)   . Disorders of diaphragm   . Acute bronchitis   . Hypertension   . CAD (coronary artery disease)     Catheterization 2009, mild coronary disease (after abnormal nuclear study,With hypotensive response to exercise, 2009)  . RBBB   . Atrial flutter     Ablated in the past, no recurrence  . Venous insufficiency   . Edema   . Dyslipidemia     Patient has an and to use statin  . GERD (gastroesophageal reflux disease)   . Diverticulosis of colon   . Hypertrophy of prostate with urinary obstruction and other lower urinary tract symptoms (LUTS)   . Increased prostate specific antigen (PSA) velocity   . DJD (degenerative joint disease)   . Chronic insomnia   . Malignant melanoma     Removed from mediastinum via thoracotomy July, 2010  . Ejection fraction     EF 60%,Echo, 2009,  /  EF 55-60%, echo, February, 2013  . Aortic stenosis     Mild, echo and catheterization, 2009 /  Mild, echo, February, 2013  . Mitral stenosis     Very mild, echo, from mitral annular calcification  . Shortness of breath     Episodes of feeling shortness of breath with some mild dizziness with mild exertion., February, 2013  . Tricuspid regurgitation     Moderate, echo, February, 2013, 56 mmHg  . Pulmonary hypertension     56 mmHg, echo, February, 2013   Past Surgical History  Procedure Date  . Bilateral inguinal hernia repair   . Umbilical hernia repair   . Median sternotomy for mediastinal melanoma 1980s    Related Meds:  Scheduled Meds:   .  [COMPLETED] acetylcysteine  2 mL Nebulization Q6H  . amoxicillin-clavulanate  1 tablet Oral BID  . aspirin  325 mg Oral Daily  . atenolol  25 mg Oral Daily  . Chlorhexidine Gluconate Cloth  6 each Topical Q0600  . darifenacin  7.5 mg Oral Daily  . dutasteride  0.5 mg Oral Daily  . etodolac  200 mg Oral BID  . famotidine  20 mg Oral QHS  . levalbuterol  0.63 mg Nebulization Q6H  . linezolid  600 mg Oral Q12H  . [COMPLETED]  morphine injection  2 mg Intravenous Once  . mupirocin ointment  1 application Nasal BID  . pantoprazole  40 mg Oral Daily  . polyethylene glycol  17 g Oral Daily  . sodium chloride  3 mL Intravenous Q12H  . sodium chloride  3 mL Intravenous Q12H  . Tamsulosin HCl  0.4 mg Oral BID   Continuous Infusions:  PRN Meds:.sodium chloride, acetaminophen, food thickener, guaiFENesin-dextromethorphan, lip balm, ondansetron (ZOFRAN) IV, oxyCODONE, sodium chloride   Ht: 6' (182.9 cm)  Wt: 182 lb (82.555 kg)  Ideal Wt: 80.9 % Ideal Wt: 101%  Usual Wt: ~185 lbs % Usual Wt: 98%  Body mass index is 24.68 kg/(m^2).  Food/Nutrition Related Hx: decreased intake since admission  Labs:  CMP     Component Value Date/Time   NA 132* 07/04/2012 0345  K 5.0 07/04/2012 0345   CL 99 07/04/2012 0345   CO2 27 07/04/2012 0345   GLUCOSE 122* 07/04/2012 0345   GLUCOSE 91 07/20/2006 0738   BUN 63* 07/04/2012 0345   CREATININE 0.95 07/04/2012 0345   CALCIUM 9.0 07/04/2012 0345   PROT 5.7* 06/28/2012 0427   ALBUMIN 3.0* 06/28/2012 0427   AST 27 06/28/2012 0427   ALT 17 06/28/2012 0427   ALKPHOS 55 06/28/2012 0427   BILITOT 1.8* 06/28/2012 0427   GFRNONAA 78* 07/04/2012 0345   GFRAA >90 07/04/2012 0345    Intake: 75-100% most meals Output:   Intake/Output Summary (Last 24 hours) at 07/05/12 1600 Last data filed at 07/05/12 1538  Gross per 24 hour  Intake    630 ml  Output    900 ml  Net   -270 ml     Diet Order: Dysphagia 3, nectar-thick liquids  Supplements/Tube  Feeding: none at this time  IVF:    Estimated Nutritional Needs:   Kcal: 2060-2310 Protein: 99-115g Fluid: ~2.4 L/day  Pt admitted with necrotizing pneumonia.  Assessed by RD on admission and reported good appetite and intake PTA.  Since admission pt has been consuming 75-100% of meals when hungry (~2 meals/day).   RD drawn to chart due to recent start of MAOI (Zyvox). Discussed nutrition needs and status with son.  Son is concerned about pt consuming 2 meals/day.  Discussed appetite and the use of supplements to support current intake.  Assisted with dinner order. RD briefly reviewed Low Tyramine diet with pt, however encouraged intake as pt is not consuming high tyramine foods at this time. Pt has been followed by SLP who recommended Dysphagia 3, nectar thick liquids.  NUTRITION DIAGNOSIS: -Increased nutrient needs (NI-5.1).  Status: Ongoing  RELATED TO: healing  AS EVIDENCE BY: necrotizing pneumonia  MONITORING/EVALUATION(Goals): 1.  Food/Beverage; improvement in intake.  Pt to supplement with resource breeze.  EDUCATION NEEDS: -Education needs addressed with pt and son.    Loyce Dys, MS RD LDN Clinical Inpatient Dietitian Pager: [phone] Weekend/After hours pager: [phone]

## 2012-07-05 NOTE — Progress Notes (Signed)
Patient ID: Christian [name]  male  ZOX:[id number]    DOB: [birth year]    DOA: 06/27/2012  PCP: Michele Mcalpine, MD  Assessment/Plan:    COPD exacerbation with acute tracheobronchitis/ HCAP/ ? Aspiration Pneumonia.  Patient hasn't improved after 3 days of IV steroids. A repeat CXR shows new patchy multifocal pneumonia despite being on levaquin. We have discontinued levaquin and started him on broad spectrum antibiotics vancomycin and zosyn. Sputum cultures were ordered. Patient remains afebrile but he has new leukocytosis. Ordered speech and swallow evaluation to evaluate patient for possible aspiration leading to aspiration pneumonia. Resume steroids, nebs and oxygen and dulera.  His ABG on 3 liters of oxygen looks decent. BIPAP ordered prn for increased work of breathing and exhaustion. A CT chest done showed extensive bilateral cavitary pneumonia with areas of necrosis with endobronchial material occluding the left lower lobe bronchus. Pulmonary consult called to see if he needs bronchoscopy. His sputum cultures show moderate MR staph aureus sensitive to vancomycin.  Appreciate pulmonary input. Antibiotics were changed to oral augmentin and zyvox for polymicrobial coverage in addition to chest percussions. As per pulmonary recommendations, will need 4 to 6 weeks of antibiotics. A repeat CXR ordered for am.    Valvular heart disease: known mild aortic stenosis, moderate mitral stenosis and moderate tricuspid regurgitation, per 2D Echocardiogram of 09/22/11 EF 55-60% . He follows up with Dr Willa Rough, cardiologist.   GERD (gastroesophageal reflux disease) - cont PPI   BPH (benign prostatic hyperplasia): no issues  Moderate pharyngeal Dysphagia: Swallow eval recommended dys phagia 3 diet with  Nectar thickened liquid  DVT Prophylaxis: heparin SQ  Code Status: Full Code  Disposition: pending.   Subjective: Feels same as yesterday. Still on 3 liters of oxygen. OOB to chair   Objective: Weight  change:   Intake/Output Summary (Last 24 hours) at 07/05/12 1332 Last data filed at 07/05/12 0736  Gross per 24 hour  Intake    300 ml  Output    800 ml  Net   -500 ml   Blood pressure 136/77, pulse 93, temperature 97.4 F (36.3 C), temperature source Oral, resp. rate 18, height 6' (1.829 m), weight 82.555 kg (182 lb), SpO2 93.00%.  Physical Exam: General: Alert and awake, oriented x3, not in any acute distress. HEENT: anicteric sclera, pupils reactive to light and accommodation, EOMI CVS: S1-S2 clear, no murmur rubs or gallops Chest:  exp wheezing> ont he left when compared to right. with bilateral scattered rhonchi. Abdomen: soft nontender, nondistended, normal bowel sounds, no organomegaly Extremities: no cyanosis, clubbing or edema noted bilaterally   Lab Results: Basic Metabolic Panel:  Lab 07/04/12 8119 07/03/12 0358  NA 132* 132*  K 5.0 5.1  CL 99 99  CO2 27 28  GLUCOSE 122* 137*  BUN 63* 60*  CREATININE 0.95 0.93  CALCIUM 9.0 9.3  MG -- --  PHOS -- --   Liver Function Tests: No results found for this basename: AST:2,ALT:2,ALKPHOS:2,BILITOT:2,PROT:2,ALBUMIN:2 in the last 168 hours No results found for this basename: LIPASE:2,AMYLASE:2 in the last 168 hours No results found for this basename: AMMONIA:2 in the last 168 hours CBC:  Lab 07/04/12 0345 07/03/12 0358  WBC 17.6* 11.8*  NEUTROABS -- --  HGB 14.3 14.6  HCT 42.8 43.7  MCV 87.5 87.9  PLT 139* 137*   Cardiac Enzymes: No results found for this basename: CKTOTAL:3,CKMB:3,CKMBINDEX:3,TROPONINI:3 in the last 168 hours BNP: No components found with this basename: POCBNP:2 CBG:  Lab 07/04/12 1129 07/04/12 0742 07/03/12  2124 07/03/12 1632 07/03/12 1203  GLUCAP 164* 115* 107* 128* 123*     Micro Results: Recent Results (from the past 240 hour(s))  CULTURE, RESPIRATORY     Status: Normal   Collection Time   07/01/12 11:20 PM      Component Value Range Status Comment   Specimen Description SPUTUM    Final    Special Requests NONE   Final    Gram Stain     Final    Value: ABUNDANT WBC PRESENT,BOTH PMN AND MONONUCLEAR     MODERATE SQUAMOUS EPITHELIAL CELLS PRESENT     MODERATE GRAM POSITIVE COCCI IN CLUSTERS     IN PAIRS IN CHAINS RARE GRAM POSITIVE RODS   Culture     Final    Value: MODERATE METHICILLIN RESISTANT STAPHYLOCOCCUS AUREUS     Note: RIFAMPIN AND GENTAMICIN SHOULD NOT BE USED AS SINGLE DRUGS FOR TREATMENT OF STAPH INFECTIONS. This organism DOES NOT demonstrate inducible Clindamycin resistance in vitro. CRITICAL RESULT CALLED TO, READ BACK BY AND VERIFIED WITH: REEVES RN 9AM      07/04/12 GUSTK   Report Status 07/04/2012 FINAL   Final    Organism ID, Bacteria METHICILLIN RESISTANT STAPHYLOCOCCUS AUREUS   Final   MRSA PCR SCREENING     Status: Abnormal   Collection Time   07/02/12 10:58 AM      Component Value Range Status Comment   MRSA by PCR POSITIVE (*) NEGATIVE Final     Studies/Results: Dg Chest 2 View  06/27/2012  *RADIOLOGY REPORT*  Clinical Data: Cough, congestion, shortness of breath  CHEST - 2 VIEW  Comparison: 04/13/2011  Findings: Stable elevation of the left hemidiaphragm with associated left basilar atelectasis / scarring.  Chronic interstitial markings without focal consolidation. No pleural effusion or pneumothorax.  The heart is top normal in size.  Postsurgical changes related to prior CABG.  Degenerative changes of the visualized thoracolumbar spine.  IMPRESSION: Chronic left basilar scarring/atelectasis.  No evidence of acute cardiopulmonary disease.   Original Report Authenticated By: Charline Bills, M.D.     Medications: Scheduled Meds:    . [COMPLETED] acetylcysteine  2 mL Nebulization Q6H  . amoxicillin-clavulanate  1 tablet Oral BID  . aspirin  325 mg Oral Daily  . atenolol  25 mg Oral Daily  . Chlorhexidine Gluconate Cloth  6 each Topical Q0600  . darifenacin  7.5 mg Oral Daily  . dutasteride  0.5 mg Oral Daily  . etodolac  200 mg Oral BID   . famotidine  20 mg Oral QHS  . levalbuterol  0.63 mg Nebulization Q6H  . linezolid  600 mg Oral Q12H  . [COMPLETED]  morphine injection  2 mg Intravenous Once  . mupirocin ointment  1 application Nasal BID  . pantoprazole  40 mg Oral Daily  . polyethylene glycol  17 g Oral Daily  . sodium chloride  3 mL Intravenous Q12H  . sodium chloride  3 mL Intravenous Q12H  . Tamsulosin HCl  0.4 mg Oral BID      LOS: 8 days   Pranshu Lyster M.D. [facility] 07/05/2012, 1:32 PM Pager: [phone]  If 7PM-7AM, please contact night-coverage [email or web address] Password [email or web address]

## 2012-07-05 NOTE — Progress Notes (Signed)
PULMONARY/CCM NOTE  Requesting MD/Service: [facility] Date of admission: 11/25 Date of consult: 12/1 Reason for consultation: necrotizing PNA  Pt Profile: 64M pt of SN with hx of COPD adm 11/25 to [facility] service with 5 days of increased SOB, purulent sputum, bilateral infiltrates and diagnosis of PNA. His sputum cx is positive for MRSA. He has been properly treated with abx and is symptomatically improved. CT chest performed 11/30 demonstrated bilateral lower lobe infiltrates and necrosis. PCCM asked to assess whether there is a role for FOB and to assist in further eval and mgmt    Filed Vitals:   07/04/12 2113 07/05/12 0245 07/05/12 0459 07/05/12 0845  BP: 116/83  136/77   Pulse: 89  93   Temp: 98.6 F (37 C)  97.4 F (36.3 C)   TempSrc: Oral  Oral   Resp: 18  18   Height:      Weight:      SpO2: 92% 92% 92% 93%   SUBJ: No new complaints. No distress   EXAM:  Gen: Mild rest dyspnea, Cough less wet, still frequent and vigorous HEENT: WNL Neck: No JVD Lungs: Diffuse rhonchi bilaterally - improved Cardiovascular: RRR s M Abdomen: soft, NT, NABS Ext: No C/C/E Neuro: no focal deficits   DATA:  BMET    Component Value Date/Time   NA 132* 07/04/2012 0345   K 5.0 07/04/2012 0345   CL 99 07/04/2012 0345   CO2 27 07/04/2012 0345   GLUCOSE 122* 07/04/2012 0345   GLUCOSE 91 07/20/2006 0738   BUN 63* 07/04/2012 0345   CREATININE 0.95 07/04/2012 0345   CALCIUM 9.0 07/04/2012 0345   GFRNONAA 78* 07/04/2012 0345   GFRAA >90 07/04/2012 0345    CBC    Component Value Date/Time   WBC 17.6* 07/04/2012 0345   RBC 4.89 07/04/2012 0345   HGB 14.3 07/04/2012 0345   HCT 42.8 07/04/2012 0345   PLT 139* 07/04/2012 0345   MCV 87.5 07/04/2012 0345   MCH 29.2 07/04/2012 0345   MCHC 33.4 07/04/2012 0345   RDW 13.8 07/04/2012 0345   LYMPHSABS 0.3* 06/27/2012 0920   MONOABS 1.4* 06/27/2012 0920   EOSABS 0.0 06/27/2012 0920   BASOSABS 0.0 06/27/2012 0920     CXR: No new film   IMPRESSION:    Principal Problem:  *Necrotizing pneumonia, community acquired MRSA Active Problems:  COPD (chronic obstructive pulmonary disease)   PLAN:  -Cont PO linezolid and Amox/clav.  -Will need prolonged course (4-6 wks depending on clinical and radiographic resolution) of antimicrobial therapy given extensive necrosis -Cont chest percussion vest to facilitate mucus clearance -Cont nebulized steroids and beta agonist -He will need home O2 after discharge - possibly permanently -Cont PT -Check PA/lat CXR AM 12/4 -Would consider D/C PICC now or soon   Billy Fischer, MD ; [facility] service Mobile [phone].  After 5:30 PM or weekends, call [phone]

## 2012-07-05 NOTE — Progress Notes (Signed)
D'CD chest vest after the 2000 treatment tonight. Will continue to use the flutter.

## 2012-07-05 NOTE — Progress Notes (Signed)
Physical Therapy Treatment Patient Details Name: Christian [name] MRN: [id number] DOB: Dec 07, 1931 Today's Date: 07/05/2012 Time: 4098-1191 PT Time Calculation (min): 15 min  PT Assessment / Plan / Recommendation Comments on Treatment Session  Noted pt to have increased secretions during session, however also noted pt to be somewhat more unsteady today, even with RW, requiring min assist to steady and negotiate RW properly.  Pt now wanting to consider ST SNF and PT feels that pt will benefit.  He also states concern with his wife's ability to fully care for pt at this time.  Will contact CSW to speak with pt/family.     Follow Up Recommendations  SNF;Supervision/Assistance - 24 hour     Does the patient have the potential to tolerate intense rehabilitation     Barriers to Discharge        Equipment Recommendations  Rolling walker with 5" wheels    Recommendations for Other Services    Frequency Min 3X/week   Plan Discharge plan needs to be updated    Precautions / Restrictions Precautions Precautions: Fall Precaution Comments: Monitor O2, on 3L Restrictions Weight Bearing Restrictions: No   Pertinent Vitals/Pain Some mild back pain    Mobility  Bed Mobility Bed Mobility: Not assessed Transfers Transfers: Sit to Stand;Stand to Sit Sit to Stand: 5: Supervision;With upper extremity assist;From bed Stand to Sit: With armrests;With upper extremity assist;To chair/3-in-1;4: Min guard Details for Transfer Assistance: cues for safety and controlled descent when sitting.  Pt has tendency to "flop" in chair.  Ambulation/Gait Ambulation/Gait Assistance: 4: Min assist Ambulation Distance (Feet): 75 Feet (then 72' more) Assistive device: Rolling walker Ambulation/Gait Assistance Details: Cues and assist to maintain proper positioning with RW.  Noted pt to be somewhat more unsteady today, even with RW and would let RW get too far ahead of him.  Pt ambulated on 3L O2 with O2 sats at 94%  halfway through ambulation.   Gait Pattern: Step-through pattern Gait velocity: decreased with forward flexed posture.     Exercises     PT Diagnosis:    PT Problem List:   PT Treatment Interventions:     PT Goals Acute Rehab PT Goals PT Goal Formulation: With patient/family Time For Goal Achievement: 07/12/12 Potential to Achieve Goals: Good Pt will go Sit to Stand: with modified independence PT Goal: Sit to Stand - Progress: Progressing toward goal Pt will Ambulate: >150 feet;with modified independence;with least restrictive assistive device PT Goal: Ambulate - Progress: Progressing toward goal  Visit Information  Last PT Received On: 07/05/12 Assistance Needed: +1    Subjective Data  Subjective: I may as well walk Patient Stated Goal: Home   Cognition  Overall Cognitive Status: Appears within functional limits for tasks assessed/performed Arousal/Alertness: Awake/alert Orientation Level: Appears intact for tasks assessed Behavior During Session: [facility] for tasks performed    Balance     End of Session PT - End of Session Equipment Utilized During Treatment: Oxygen Activity Tolerance: Patient limited by fatigue Patient left: in chair;with call bell/phone within reach;with family/visitor present Nurse Communication: Mobility status   GP     Page, Meribeth Mattes 07/05/2012, 5:05 PM

## 2012-07-05 NOTE — Progress Notes (Signed)
SLP Cancellation Note  Patient Details Name: Christian [name] MRN: [id number] DOB: [birth year]   Cancelled treatment:        SLP cancel visit today due to pt having respiratory difficulties:  Per RN, he strangled on his salvia with resultant respiratory distress and oxygen saturation decline.   Pt now resting per RN.  RN also reports pt's family desires for him to be allowed to sleep unless absolutely necessary.    SLP to follow up later this week.  Educated pt and son yesterday that SLP role is to decrease amount pt may aspirate, not eliminate it.  Both verbalized understanding to information.    Chales Abrahams 07/05/2012, 12:23 PM

## 2012-07-06 ENCOUNTER — Inpatient Hospital Stay (HOSPITAL_COMMUNITY): Payer: Medicare Other

## 2012-07-06 DIAGNOSIS — J159 Unspecified bacterial pneumonia: Secondary | ICD-10-CM

## 2012-07-06 DIAGNOSIS — R1313 Dysphagia, pharyngeal phase: Secondary | ICD-10-CM | POA: Diagnosis present

## 2012-07-06 DIAGNOSIS — R5381 Other malaise: Secondary | ICD-10-CM

## 2012-07-06 DIAGNOSIS — J441 Chronic obstructive pulmonary disease with (acute) exacerbation: Principal | ICD-10-CM

## 2012-07-06 LAB — BASIC METABOLIC PANEL
BUN: 55 mg/dL — ABNORMAL HIGH (ref 6–23)
CO2: 27 mEq/L (ref 19–32)
Chloride: 104 mEq/L (ref 96–112)
Creatinine, Ser: 1.04 mg/dL (ref 0.50–1.35)
Glucose, Bld: 161 mg/dL — ABNORMAL HIGH (ref 70–99)

## 2012-07-06 LAB — CBC
HCT: 44 % (ref 39.0–52.0)
MCV: 87 fL (ref 78.0–100.0)
RBC: 5.06 MIL/uL (ref 4.22–5.81)
WBC: 28.3 10*3/uL — ABNORMAL HIGH (ref 4.0–10.5)

## 2012-07-06 NOTE — Progress Notes (Signed)
Physical Therapy Treatment Patient Details Name: Christian [name] MRN: [id number] DOB: 03-04-1932 Today's Date: 07/06/2012 Time: 4098-1191 PT Time Calculation (min): 20 min  PT Assessment / Plan / Recommendation Comments on Treatment Session  Pt. continues with increased secretions. Pt. ambulated on 2 l of O2 and sats 94 %. Pt. is being considered for CIR. recommend post acute rehab for multiple therapies involved. Pt. is participatory.    Follow Up Recommendations  CIR     Does the patient have the potential to tolerate intense rehabilitation     Barriers to Discharge        Equipment Recommendations  Rolling walker with 5" wheels    Recommendations for Other Services    Frequency Min 3X/week   Plan Discharge plan needs to be updated;Frequency remains appropriate    Precautions / Restrictions Precautions Precautions: Fall Precaution Comments: Monitor O2, on 3L    Pertinent Vitals/Pain sats >94% on 2l to ambulate. coughing with activity    Mobility  Transfers Sit to Stand: 5: Supervision;With upper extremity assist;From chair/3-in-1;With armrests Stand to Sit: With armrests;To chair/3-in-1;5: Supervision Ambulation/Gait Ambulation/Gait Assistance: 4: Min guard Ambulation Distance (Feet): 175 Feet Assistive device: Rolling walker Ambulation/Gait Assistance Details: cues for posture and avouding objects. Gait Pattern: Step-through pattern;Trunk flexed    Exercises     PT Diagnosis:    PT Problem List:   PT Treatment Interventions:     PT Goals Acute Rehab PT Goals Pt will go Sit to Stand: with modified independence PT Goal: Sit to Stand - Progress: Progressing toward goal Pt will Ambulate: >150 feet;with modified independence;with least restrictive assistive device PT Goal: Ambulate - Progress: Progressing toward goal  Visit Information  Last PT Received On: 07/06/12 Assistance Needed: +1    Subjective Data  Subjective: I am ready to walk   Cognition  Overall Cognitive Status: Appears within functional limits for tasks assessed/performed    Balance     End of Session PT - End of Session Activity Tolerance: Patient tolerated treatment well (frequent coughing.) Patient left: in chair;with call bell/phone within reach   GP     Rada Hay 07/06/2012, 2:56 [id number]

## 2012-07-06 NOTE — Progress Notes (Signed)
Patient ID: Christian [name]  male  GNF:[id number]    DOB: 1932/05/25    DOA: 06/27/2012  PCP: Michele Mcalpine, MD  Assessment/Plan: Principal Problem:  *Necrotizing pneumonia, community acquired MRSA - Continue Zyvox and augmentin, per pulmonary recommendations, will need 4-6 weeks of antibiotics - Repeat CXR today shows no significant change  - leukocytosis ? to acute illness vs steroids effect, Solumedrol was dc'ed on 07/04/12. Patient is afebrile with no deterioration in his clinical status. I will consult with ID if WBC continues to tend up.     Active Problems:  COPD (chronic obstructive pulmonary disease) - Cont flutter valve, xopenex nebs, O2   Acute and chronic respiratory failure with hypoxia; as per #1 and #2  Pharyngeal dysphagia: - cont Dysphagia 3 diet with nectar thick liquids  GERD: cont PPI  Hypertension: stable  Valvular heart disease: known mild aortic stenosis, moderate mitral stenosis and moderate tricuspid regurgitation, per 2D Echocardiogram of 09/22/11 EF 55-60% . He follows up with Dr Willa Rough, cardiologist.  DVT Prophylaxis: SCD's  Code Status: DNR  Disposition: per family's request, CIR consult placed. However, they are open to SNF if he is not qualified. Updated the SW.     Subjective: Coughing, sitting on chair, does not like hospital bed, on 3Lo2   Objective: Weight change:   Intake/Output Summary (Last 24 hours) at 07/06/12 1358 Last data filed at 07/06/12 0732  Gross per 24 hour  Intake    240 ml  Output    475 ml  Net   -235 ml   Blood pressure 115/58, pulse 83, temperature 97.2 F (36.2 C), temperature source Oral, resp. rate 18, height 6' (1.829 m), weight 82.555 kg (182 lb), SpO2 91.00%.  Physical Exam: General: Alert and awake, oriented x3, not in any acute distress. HEENT: anicteric sclera, pupils reactive to light and accommodation, EOMI CVS: S1-S2 clear, no murmur rubs or gallops Chest: diffuse rhonchi b/l Abdomen: soft  nontender, nondistended, normal bowel sounds, no organomegaly Extremities: no cyanosis, clubbing or edema noted bilaterally Neuro: non focal  Lab Results: Basic Metabolic Panel:  Lab 07/06/12 8469 07/04/12 0345  NA 139 132*  K 4.2 5.0  CL 104 99  CO2 27 27  GLUCOSE 161* 122*  BUN 55* 63*  CREATININE 1.04 0.95  CALCIUM 8.8 9.0  MG -- --  PHOS -- --   CBC:  Lab 07/06/12 1110 07/04/12 0345  WBC 28.3* 17.6*  NEUTROABS -- --  HGB 14.6 14.3  HCT 44.0 42.8  MCV 87.0 87.5  PLT 175 139*   CBG:  Lab 07/04/12 1129 07/04/12 0742 07/03/12 2124 07/03/12 1632 07/03/12 1203  GLUCAP 164* 115* 107* 128* 123*     Micro Results: Recent Results (from the past 240 hour(s))  CULTURE, RESPIRATORY     Status: Normal   Collection Time   07/01/12 11:20 PM      Component Value Range Status Comment   Specimen Description SPUTUM   Final    Special Requests NONE   Final    Gram Stain     Final    Value: ABUNDANT WBC PRESENT,BOTH PMN AND MONONUCLEAR     MODERATE SQUAMOUS EPITHELIAL CELLS PRESENT     MODERATE GRAM POSITIVE COCCI IN CLUSTERS     IN PAIRS IN CHAINS RARE GRAM POSITIVE RODS   Culture     Final    Value: MODERATE METHICILLIN RESISTANT STAPHYLOCOCCUS AUREUS     Note: RIFAMPIN AND GENTAMICIN SHOULD NOT BE USED AS SINGLE  DRUGS FOR TREATMENT OF STAPH INFECTIONS. This organism DOES NOT demonstrate inducible Clindamycin resistance in vitro. CRITICAL RESULT CALLED TO, READ BACK BY AND VERIFIED WITH: REEVES RN 9AM      07/04/12 GUSTK   Report Status 07/04/2012 FINAL   Final    Organism ID, Bacteria METHICILLIN RESISTANT STAPHYLOCOCCUS AUREUS   Final   MRSA PCR SCREENING     Status: Abnormal   Collection Time   07/02/12 10:58 AM      Component Value Range Status Comment   MRSA by PCR POSITIVE (*) NEGATIVE Final     Studies/Results: Dg Chest 2 View  07/06/2012  *RADIOLOGY REPORT*  Clinical Data: Lung infiltrates, follow-up, history of COPD and mediastinal melanoma  CHEST - 2 VIEW   Comparison: CT chest of 07/02/2012 and chest x-ray of 07/01/2012  Findings: The parenchymal opacities in the left lung apex, right lung base, and to a greater degree in the left lung base have not changed significantly.  There do appear to be small effusions present.  Mild cardiomegaly is stable.  Median sternotomy sutures are noted from prior CABG.  No acute bony abnormality is seen.  IMPRESSION: No change in somewhat nodular lung opacities bilaterally particularly in the left lung base with small effusions.   Original Report Authenticated By: Dwyane Dee, M.D.    Dg Chest 2 View  07/01/2012  *RADIOLOGY REPORT*  Clinical Data: Persistent shortness of breath, cough, and congestion.  CHEST - 2 VIEW  Comparison: 06/27/2012 and 04/13/2011  Findings: Stable cardiomegaly.  Changes of median sternotomy with surgical clips in the superior mediastinum.   There is there is chronic elevation of the left hemidiaphragm.  Significant change in aeration of the lungs compared to the chest radiograph of 06/27/2012.  There is multifocal patchy airspace disease, most prominent in the right lung base laterally and in the left upper lung field, with an prominent focal opacity just adjacent to the aortic arch.  Small bilateral pleural effusions are present.  IMPRESSION:  1.  New patchy and somewhat nodular bilateral airspace disease is worrisome for multifocal pneumonia.  There are small bilateral pleural effusions.  Recommend follow-up to clearing. 2.  Chronic elevation of the left hemidiaphragm.  The study is made a call report.   Original Report Authenticated By: Britta Mccreedy, M.D.    Dg Chest 2 View  06/27/2012  *RADIOLOGY REPORT*  Clinical Data: Cough, congestion, shortness of breath  CHEST - 2 VIEW  Comparison: 04/13/2011  Findings: Stable elevation of the left hemidiaphragm with associated left basilar atelectasis / scarring.  Chronic interstitial markings without focal consolidation. No pleural effusion or pneumothorax.   The heart is top normal in size.  Postsurgical changes related to prior CABG.  Degenerative changes of the visualized thoracolumbar spine.  IMPRESSION: Chronic left basilar scarring/atelectasis.  No evidence of acute cardiopulmonary disease.   Original Report Authenticated By: Charline Bills, M.D.    Ct Chest Wo Contrast  07/03/2012  *RADIOLOGY REPORT*  Clinical Data: Abnormal chest x-ray with multifocal airspace disease and hemoptysis.  CT CHEST WITHOUT CONTRAST  Technique:  Multidetector CT imaging of the chest was performed following the standard protocol without IV contrast.  Comparison: Chest x-ray dated 07/01/2012.  Findings: There is extensive bilateral pneumonia.  In the left lung, extensive consolidation is seen in the upper lobe and lower lobe with areas of cavitation and necrosis present.  The left lower lobe bronchus is occluded by material. There are also multiple nodules identified in the lower lobe and  upper lobe adjacent areas of consolidation.  Some of these nodules are partially cavitary.  In the right lung, extensive consolidation is identified in the inferior lower lobe with multiple areas of cavitation and necrosis within densely consolidated lung.  Air bronchograms are also present in consolidated lung. A nodule is present in the posterior right upper lobe and patchy infiltrate is also present in the superior segment of the lower lobe.  A small amount of pleural fluid is present bilaterally, left greater than right.  The heart is enlarged and extensive calcifications are seen involving the aortic valve and mitral valve annulus.  No bony abnormalities.  IMPRESSION: Extensive bilateral cavitary pneumonia with areas of necrosis. Small associated bilateral pleural effusions.  Endobronchial material occludes the left lower lobe bronchus.  If an organism is not isolated by sputum or blood, consider bronchoscopic evaluation.   Original Report Authenticated By: Irish Lack, M.D.    Dg  Swallowing Func-speech Pathology  07/02/2012  Breck Coons [place], CCC-SLP     07/02/2012  3:29 PM Objective Swallowing Evaluation: Modified Barium Swallowing Study   Patient Details  Name: DEYTON ELLENBECKER MRN: [id number] Date of Birth: 1932/07/05  Today's Date: 07/02/2012 Time: 4098-1191 SLP Time Calculation (min): 22 min  Past Medical History:  Past Medical History  Diagnosis Date  . Allergic rhinitis   . COPD (chronic obstructive pulmonary disease)   . Disorders of diaphragm   . Acute bronchitis   . Hypertension   . CAD (coronary artery disease)     Catheterization 2009, mild coronary disease (after abnormal  nuclear study,With hypotensive response to exercise, 2009)  . RBBB   . Atrial flutter     Ablated in the past, no recurrence  . Venous insufficiency   . Edema   . Dyslipidemia     Patient has an and to use statin  . GERD (gastroesophageal reflux disease)   . Diverticulosis of colon   . Hypertrophy of prostate with urinary obstruction and other  lower urinary tract symptoms (LUTS)   . Increased prostate specific antigen (PSA) velocity   . DJD (degenerative joint disease)   . Chronic insomnia   . Malignant melanoma     Removed from mediastinum via thoracotomy July, 2010  . Ejection fraction     EF 60%,Echo, 2009,  /  EF 55-60%, echo, February, 2013  . Aortic stenosis     Mild, echo and catheterization, 2009 /  Mild, echo, February,  2013  . Mitral stenosis     Very mild, echo, from mitral annular calcification  . Shortness of breath     Episodes of feeling shortness of breath with some mild  dizziness with mild exertion., February, 2013  . Tricuspid regurgitation     Moderate, echo, February, 2013, 56 mmHg  . Pulmonary hypertension     56 mmHg, echo, February, 2013   Past Surgical History:  Past Surgical History  Procedure Date  . Bilateral inguinal hernia repair   . Umbilical hernia repair   . Median sternotomy for mediastinal melanoma 1980s   HPI:  78 yr old admitted with SOB with COPD exacerbation.  Initial  CXR  11/25 revealed no active disease with repeat CXR 11/29 revealing  airspace disease worrisome for pna, bilateral pleural effusion.   PMH:  GERD, COPD, acute bronchists, HTN, CABG, allergic rhinitis,  disorders of diaphragm.  MBS recommended to recommend safest and  most efficient po.     Assessment / Plan / Recommendation Clinical Impression  Dysphagia Diagnosis:  Mild oral phase dysphagia;Moderate  pharyngeal phase dysphagia Clinical impression: Pt. demonstrated mild motor oral dysphagia  and moderate motor based pharyngeal dysphagia.  Mildly decreased  tongue to palatal contact with mild lingual residuals  intermittently observed.  Pharygneal phase described as reduced  laryngeal elevation and closure resulting in penetration during  and aspiration observed below vocal cords post swallow without  sensation.  Pt. did exhibit a significantly delayed cough that  may have been due to current pulmonary status versus sensation to  barium (?)  Chin tuck technique was attempted, however  ineffective in preventing vestibule entrance of barium.   Vallecular (mod) and pyriform sinus (mild-mod) residue present  with mild-mod clearance following verbal cues to perform second  swallow.  Despite potential for mildly increased of pharyngeal  residue with thicker liquids, recommend nectar thick consistency  as no penetration observed during swallow with nectar nor  penetration/aspiration post swallow from residuals.  Recommend  Dys 3 diet primarily for energy conservation.  Pt. likely with  chronic increased aspiration risk due to COPD which is heightened  when immune system is compromised due to illness.  SLP will  continue to follow for safety with diet and further pt. and  family education.    Treatment Recommendation  Therapy as outlined in treatment plan below    Diet Recommendation Dysphagia 3 (Mechanical Soft);Nectar-thick  liquid   Liquid Administration via: Cup;No straw Medication Administration: Whole meds with puree  Supervision: Patient able to self feed;Full supervision/cueing  for compensatory strategies Compensations: Slow rate;Small sips/bites;Multiple dry swallows  after each bite/sip;Follow solids with liquid;Clear throat  intermittently Postural Changes and/or Swallow Maneuvers: Seated upright 90  degrees;Upright 30-60 min after meal    Other  Recommendations Recommended Consults: MBS Oral Care Recommendations: Oral care BID Other Recommendations: Order thickener from pharmacy   Follow Up Recommendations   (home health if still needed at discharge)    Frequency and Duration min 2x/week  2 weeks       SLP Swallow Goals Patient will consume recommended diet without observed clinical  signs of aspiration with: Minimal cueing Patient will utilize recommended strategies during swallow to  increase swallowing safety with: Minimal cueing      Reason for Referral Objectively evaluate swallowing function   Oral Phase Oral Preparation/Oral Phase Oral Phase: Impaired Oral - Nectar Oral - Nectar Cup: Weak lingual manipulation Oral - Solids Oral - Regular: Weak lingual manipulation   Pharyngeal Phase Pharyngeal Phase Pharyngeal Phase: Impaired Pharyngeal - Thin Pharyngeal - Thin Teaspoon: Reduced tongue base  retraction;Reduced laryngeal elevation;Penetration/Aspiration  during swallow;Pharyngeal residue - valleculae;Pharyngeal residue  - pyriform sinuses Penetration/Aspiration details (thin teaspoon): Material enters  airway, CONTACTS cords and not ejected out Pharyngeal - Thin Cup: Pharyngeal residue - pyriform  sinuses;Pharyngeal residue - valleculae;Reduced laryngeal  elevation;Reduced tongue base retraction;Penetration/Aspiration  during swallow;Penetration/Aspiration after swallow Penetration/Aspiration details (thin cup): Material enters  airway, passes BELOW cords without attempt by patient to eject  out (silent aspiration) Pharyngeal - Solids Pharyngeal - Puree: Pharyngeal residue - valleculae;Pharyngeal  residue - pyriform  sinuses;Reduced laryngeal elevation;Reduced  tongue base retraction Pharyngeal - Regular: Reduced tongue base retraction;Pharyngeal  residue - valleculae  Cervical Esophageal Phase    Cervical Esophageal Phase Cervical Esophageal Phase: [facility]        Darrow Bussing.Ed CCC-SLP Pager [phone]  07/02/2012         Medications: Scheduled Meds:    . amoxicillin-clavulanate  1 tablet Oral BID  . aspirin  325 mg Oral  Daily  . atenolol  25 mg Oral Daily  . Chlorhexidine Gluconate Cloth  6 each Topical Q0600  . darifenacin  7.5 mg Oral Daily  . dutasteride  0.5 mg Oral Daily  . etodolac  200 mg Oral BID  . famotidine  20 mg Oral QHS  . feeding supplement  1 Container Oral BID  . levalbuterol  0.63 mg Nebulization Q6H  . linezolid  600 mg Oral Q12H  . mupirocin ointment  1 application Nasal BID  . pantoprazole  40 mg Oral Daily  . polyethylene glycol  17 g Oral Daily  . sodium chloride  3 mL Intravenous Q12H  . sodium chloride  3 mL Intravenous Q12H  . Tamsulosin HCl  0.4 mg Oral BID      LOS: 9 days   RAI,RIPUDEEP M.D. [facility] 07/06/2012, 1:58 PM Pager: [phone]  If 7PM-7AM, please contact night-coverage [email or web address] Password [email or web address]

## 2012-07-06 NOTE — Progress Notes (Signed)
Rehab admissions - Evaluated for possible admission.  Please see rehab consult done today recommending SNF.  Recommend pursuit of SNF.  Call me for questions.  #[phone]

## 2012-07-06 NOTE — Progress Notes (Signed)
Speech Language Pathology Dysphagia Treatment Patient Details Name: Christian [name] MRN: [id number] DOB: May 20, 1932 Today's Date: 07/06/2012 Time: 1240-1305 SLP Time Calculation (min): 25 min  Assessment / Plan / Recommendation Clinical Impression  Significant coughing was noted coming from patient's room.  Patient with cups of water with straws , as well as Breeze supplement (also a thin liquid) on bedside table.  Patient coughing up thick, bloody mucous and spitting into emesis basin.  Patient and RN were educated to continued need for Nectar thick liquid, to prevent aspiration, and ongoing pneumonias.  Aspiration is not the only reason for his pulmonary compromise, but may certainly be a contributing factor.  Patient agrees to drink only nectar thick.  Continued education to family and all staff is needed.    Diet Recommendation  Continue with Current Diet: Dysphagia 3 (mechanical soft);Nectar-thick liquid    SLP Plan Continue with current plan of care   Pertinent Vitals/Pain n/a   Swallowing Goals     General Temperature Spikes Noted: No Respiratory Status: Supplemental O2 delivered via (comment) (Coughing bloody mucous plugs into emesis basin.) Behavior/Cognition: Alert;Cooperative;Pleasant mood;Impulsive Oral Cavity - Dentition: Adequate natural dentition Patient Positioning: Upright in chair  Oral Cavity - Oral Hygiene Does patient have any of the following "at risk" factors?: Saliva - thick, dry mouth;Diet - patient on thickened liquids;Oxygen therapy - cannula, mask, simple oxygen devices Brush patient's teeth BID with toothbrush (using toothpaste with fluoride): Yes Patient is HIGH RISK - Oral Care Protocol followed (see row info): Yes Patient is AT RISK - Oral Care Protocol followed (see row info): Yes   Dysphagia Treatment Treatment focused on: Patient/family/caregiver education;Skilled observation of diet tolerance Treatment Methods/Modalities: Skilled  observation Patient observed directly with PO's: Yes Type of PO's observed: Nectar-thick liquids;Thin liquids Feeding: Able to feed self Liquids provided via: Cup Pharyngeal Phase Signs & Symptoms: Wet vocal quality;Immediate cough;Changes in respirations Type of cueing: Verbal Amount of cueing: Minimal   GO     Maryjo Rochester T 07/06/2012, 1:23 PM

## 2012-07-06 NOTE — Consult Note (Signed)
Physical Medicine and Rehabilitation Consult Reason for Consult: HCAP/deconditioning Referring Physician: [facility]   HPI: Christian [name] is a 78 y.o. right-handed male with history of pulmonary hypertension, chronic right bundle branch block with atrial flutter and COPD. Admitted 06/27/2012 with progressive shortness of breath and wheezing. Patient noted increasing cough with sputum production with associated chills. Chest x-ray showed chronic left basilar scarring with atelectasis no acute disease. CT of the chest with extensive bilateral cavitary pneumonia with areas of necrosis. Patient maintained on broad-spectrum antibiotics. Patient had been placed in a chest percussion vest to facilitate mucus clearance. Followup care by critical care medicine. Presently maintained on Augmentin as well as by mouth Zyvox and anticipated course of 4-6 weeks given extensive necrosis per chest x-ray. Patient has been placed on contact precautions. Arrangements also be made for home oxygen. Patient is presently maintained on a dysphagia 3 nectar thick liquid diet secondary to dysphagia and followup per speech therapy. Physical and occupational therapy evaluations completed an ongoing noted issues in regards to endurance and fatigue. M.D. as well as the family has requested physical medicine rehabilitation consult to consider inpatient rehabilitation services  Review of Systems  Constitutional: Positive for chills.  Respiratory: Positive for cough, sputum production and wheezing.   Genitourinary: Positive for urgency.  Musculoskeletal: Positive for myalgias.  Neurological: Positive for weakness.  All other systems reviewed and are negative.   Past Medical History  Diagnosis Date  . Allergic rhinitis   . COPD (chronic obstructive pulmonary disease)   . Disorders of diaphragm   . Acute bronchitis   . Hypertension   . CAD (coronary artery disease)     Catheterization 2009, mild coronary disease (after abnormal  nuclear study,With hypotensive response to exercise, 2009)  . RBBB   . Atrial flutter     Ablated in the past, no recurrence  . Venous insufficiency   . Edema   . Dyslipidemia     Patient has an and to use statin  . GERD (gastroesophageal reflux disease)   . Diverticulosis of colon   . Hypertrophy of prostate with urinary obstruction and other lower urinary tract symptoms (LUTS)   . Increased prostate specific antigen (PSA) velocity   . DJD (degenerative joint disease)   . Chronic insomnia   . Malignant melanoma     Removed from mediastinum via thoracotomy July, 2010  . Ejection fraction     EF 60%,Echo, 2009,  /  EF 55-60%, echo, February, 2013  . Aortic stenosis     Mild, echo and catheterization, 2009 /  Mild, echo, February, 2013  . Mitral stenosis     Very mild, echo, from mitral annular calcification  . Shortness of breath     Episodes of feeling shortness of breath with some mild dizziness with mild exertion., February, 2013  . Tricuspid regurgitation     Moderate, echo, February, 2013, 56 mmHg  . Pulmonary hypertension     56 mmHg, echo, February, 2013   Past Surgical History  Procedure Date  . Bilateral inguinal hernia repair   . Umbilical hernia repair   . Median sternotomy for mediastinal melanoma 1980s   History reviewed. No pertinent family history. Social History:  reports that he quit smoking about 51 years ago. He has never used smokeless tobacco. He reports that he drinks alcohol. He reports that he does not use illicit drugs. Allergies:  Allergies  Allergen Reactions  . Ivp Dye (Iodinated Diagnostic Agents)    Medications Prior to Admission  Medication Sig Dispense Refill  . ADVAIR DISKUS 500-50 MCG/DOSE AEPB INHALE 1 PUFF TWICE A DAY  1 each  11  . aspirin 325 MG EC tablet Take 325 mg by mouth daily.        Marland Kitchen atenolol (TENORMIN) 25 MG tablet TAKE 1 TABLET BY MOUTH TWO TIMES A DAY  60 tablet  5  . AVODART 0.5 MG capsule TAKE 1 CAPSULE EVERY DAY  30  capsule  10  . Cranberry (CRANBERRY CONCENTRATE) 500 MG CAPS Take 1 capsule by mouth daily.        Marland Kitchen dextromethorphan-guaiFENesin (MUCINEX DM) 30-600 MG per 12 hr tablet Take 1 tablet by mouth every 12 (twelve) hours.        Marland Kitchen etodolac (LODINE XL) 400 MG 24 hr tablet TAKE 1 TABLET (400 MG TOTAL) BY MOUTH DAILY.  60 tablet  5  . glucosamine-chondroitin 500-400 MG tablet Take 1 tablet by mouth 3 (three) times daily.      Marland Kitchen NEXIUM 40 MG capsule TAKE 1 CAPSULE (40 MG TOTAL) BY MOUTH DAILY. 30 MINUTES BEFORE 1ST MEAL OF THE DAY  30 capsule  11  . nystatin (MYCOSTATIN) 100000 UNIT/ML suspension Gargle and swallow 1 tsp as directed, as needed  240 mL  5  . polyethylene glycol (MIRALAX / GLYCOLAX) packet Take 17 g by mouth daily.       . ranitidine (ZANTAC) 150 MG capsule TAKE 1 CAPSULE (150 MG TOTAL) BY MOUTH AT BEDTIME.  30 capsule  10  . Tamsulosin HCl (FLOMAX) 0.4 MG CAPS Take 0.4 mg by mouth 2 (two) times daily.       . trospium (SANCTURA) 20 MG tablet Take 20 mg by mouth 2 (two) times daily.        Marland Kitchen albuterol (PROVENTIL HFA;VENTOLIN HFA) 108 (90 BASE) MCG/ACT inhaler Inhale 2 puffs into the lungs every 6 (six) hours as needed. Wheezing and shortness of breath        Home: Home Living Lives With: Spouse Available Help at Discharge: Family Type of Home: House Home Access: Stairs to enter [employer] of Steps: 1 Home Layout: One level;Laundry or work area in basement [employer] Shower/Tub: Health visitor: Standard Home Adaptive Equipment: Paediatric nurse with back;Straight cane;Walker - standard;Bedside commode/3-in-1  Functional History: Prior Function Able to Take Stairs?: Yes Driving: Yes Vocation: Retired Functional Status:  Mobility: Bed Mobility Bed Mobility: Not assessed Supine to Sit: 5: Supervision Transfers Transfers: Sit to Stand;Stand to Sit Sit to Stand: 5: Supervision;With upper extremity assist;From bed Stand to Sit: With armrests;With upper  extremity assist;To chair/3-in-1;4: Min guard Ambulation/Gait Ambulation/Gait Assistance: 4: Min assist Ambulation Distance (Feet): 75 Feet (then 75' more) Assistive device: Rolling walker Ambulation/Gait Assistance Details: Cues and assist to maintain proper positioning with RW.  Noted pt to be somewhat more unsteady today, even with RW and would let RW get too far ahead of him.  Pt ambulated on 3L O2 with O2 sats at 94% halfway through ambulation.   Gait Pattern: Step-through pattern Gait velocity: decreased with forward flexed posture.  General Gait Details: 25% VC's safety with turns using RW and safety/caution of O2 tubing Stairs: No Wheelchair Mobility Wheelchair Mobility: No  ADL: ADL Grooming: Performed;Wash/dry face;Teeth care;Supervision/safety;Set up Where Assessed - Grooming: Unsupported sitting Upper Body Bathing: Performed;Chest;Right arm;Left arm;Abdomen;Set up Where Assessed - Upper Body Bathing: Unsupported sitting Lower Body Bathing: Performed;Min guard;Supervision/safety Where Assessed - Lower Body Bathing: Supported sit to stand Upper Body Dressing: Simulated;Set up Where Assessed -  Upper Body Dressing: Unsupported sitting Lower Body Dressing: Performed;Min guard Where Assessed - Lower Body Dressing: Supported sit to stand (Pt also able to don slippers with setup.) Toilet Transfer: Simulated;Minimal assistance Toilet Transfer Method: Sit to Barista: Other (comment) (recliner.) Equipment Used: Rolling walker Transfers/Ambulation Related to ADLs: Pt ambulated ~60 feet before becoming fatigued. Chair brought up behind pt. ADL Comments: Session limited 2* pt fatigue. About to be transferred to another floor.  Cognition: Cognition Arousal/Alertness: Awake/alert Orientation Level: Oriented X4 Cognition Overall Cognitive Status: Appears within functional limits for tasks assessed/performed Arousal/Alertness: Awake/alert Orientation Level:  Appears intact for tasks assessed Behavior During Session: [facility] for tasks performed  Blood pressure 115/58, pulse 83, temperature 97.2 F (36.2 C), temperature source Oral, resp. rate 18, height 6' (1.829 m), weight 82.555 kg (182 lb), SpO2 91.00%. Physical Exam  Vitals reviewed. Constitutional: He is oriented to person, place, and time. He appears well-developed.  HENT:  Head: Normocephalic.  Eyes:       Pupils round and reactive to light  Neck: Neck supple. No thyromegaly present.  Cardiovascular:       Cardiac rate controlled  Pulmonary/Chest: He is in respiratory distress. He has wheezes.       Having difficulty clearing upper airway secretions. Finally brought them up with much effort and they were thick and blood tinged.  Abdominal: Soft. Bowel sounds are normal. He exhibits no distension.  Neurological: He is alert and oriented to person, place, and time.       Patient follows full commands. Patient with noted flat affect. Strength 4/5.  Skin: Skin is warm and dry.    No results found for this or any previous visit (from the past 24 hour(s)). Dg Chest 2 View  07/06/2012  *RADIOLOGY REPORT*  Clinical Data: Lung infiltrates, follow-up, history of COPD and mediastinal melanoma  CHEST - 2 VIEW  Comparison: CT chest of 07/02/2012 and chest x-ray of 07/01/2012  Findings: The parenchymal opacities in the left lung apex, right lung base, and to a greater degree in the left lung base have not changed significantly.  There do appear to be small effusions present.  Mild cardiomegaly is stable.  Median sternotomy sutures are noted from prior CABG.  No acute bony abnormality is seen.  IMPRESSION: No change in somewhat nodular lung opacities bilaterally particularly in the left lung base with small effusions.   Original Report Authenticated By: Dwyane Dee, M.D.     Assessment/Plan: Diagnosis: necrotizing pneumonia, decondtioning 1. Does the need for close, 24 hr/day medical supervision in  concert with the patient's rehab needs make it unreasonable for this patient to be served in a less intensive setting? No 2. Co-Morbidities requiring supervision/potential complications: htn, copd 3. Due to bladder management, bowel management, skin/wound care, disease management and pain management, does the patient require 24 hr/day rehab nursing? No 4. Does the patient require coordinated care of a physician, rehab nurse, PT and OT to address physical and functional deficits in the context of the above medical diagnosis(es)? No Addressing deficits in the following areas: balance, endurance, locomotion, strength and transferring 5. Can the patient actively participate in an intensive therapy program of at least 3 hrs of therapy per day at least 5 days per week? Potentially 6. The potential for patient to make measurable gains while on inpatient rehab is fair 7. Anticipated functional outcomes upon discharge from inpatient rehab are n/a with PT, n/a with OT, n/a with SLP. 8. Estimated rehab length of stay  to reach the above functional goals is: n./a 9. Does the patient have adequate social supports to accommodate these discharge functional goals? Potentially 10. Anticipated D/C setting: Home 11. Anticipated post D/C treatments: HH therapy 12. Overall Rehab/Functional Prognosis: excellent  RECOMMENDATIONS: This patient's condition is appropriate for continued rehabilitative care in the following setting: SNF Patient has agreed to participate in recommended program. Yes Note that insurance prior authorization may be required for reimbursement for recommended care.  Comment: Pt is already at a minimal assist/contact guard assist level with therapies.  He continues to have poor pulmonary/physical stamina, however. Would be best suited in a SNF environment where he could be given adequate time to build stamina and strength.  Ivory Broad, MD    07/06/2012

## 2012-07-06 NOTE — Progress Notes (Signed)
PULMONARY/CCM NOTE  Requesting MD/Service: TRH/Akula Date of admission: 11/25 Date of consult: 12/1 Reason for consultation: necrotizing PNA  Pt Profile: 66M pt of SN with hx of COPD adm 11/25 to [facility] service with 5 days of increased SOB, purulent sputum, bilateral infiltrates and diagnosis of PNA. His sputum cx is positive for MRSA. He has been properly treated with abx and is symptomatically improved. CT chest performed 11/30 demonstrated bilateral lower lobe infiltrates and necrosis. PCCM asked to assess whether there is a role for FOB and to assist in further eval and mgmt    Filed Vitals:   07/06/12 0448 07/06/12 0752 07/06/12 1026 07/06/12 1535  BP: 124/77  115/58 99/52  Pulse: 83   85  Temp: 97.2 F (36.2 C)   98.7 F (37.1 C)  TempSrc: Oral   Oral  Resp: 18   16  Height:      Weight:      SpO2: 91% 91%  96%   SUBJ: No new complaints. No distress   EXAM:  Gen: Mild rest dyspnea, Cough less wet, still frequent and vigorous HEENT: WNL Neck: No JVD Lungs: Diffuse rhonchi bilaterally - improved Cardiovascular: RRR s M Abdomen: soft, NT, NABS Ext: No C/C/E Neuro: no focal deficits   DATA:  BMET    Component Value Date/Time   NA 139 07/06/2012 1110   K 4.2 07/06/2012 1110   CL 104 07/06/2012 1110   CO2 27 07/06/2012 1110   GLUCOSE 161* 07/06/2012 1110   GLUCOSE 91 07/20/2006 0738   BUN 55* 07/06/2012 1110   CREATININE 1.04 07/06/2012 1110   CALCIUM 8.8 07/06/2012 1110   GFRNONAA 67* 07/06/2012 1110   GFRAA 77* 07/06/2012 1110    CBC    Component Value Date/Time   WBC 28.3* 07/06/2012 1110   RBC 5.06 07/06/2012 1110   HGB 14.6 07/06/2012 1110   HCT 44.0 07/06/2012 1110   PLT 175 07/06/2012 1110   MCV 87.0 07/06/2012 1110   MCH 28.9 07/06/2012 1110   MCHC 33.2 07/06/2012 1110   RDW 13.7 07/06/2012 1110   LYMPHSABS 0.3* 06/27/2012 0920   MONOABS 1.4* 06/27/2012 0920   EOSABS 0.0 06/27/2012 0920   BASOSABS 0.0 06/27/2012 0920     CXR: No new film   IMPRESSION:    Principal Problem:  *Necrotizing pneumonia, community acquired MRSA Active Problems:  ESSENTIAL HYPERTENSION  GERD  COPD (chronic obstructive pulmonary disease)  Acute and chronic respiratory failure with hypoxia  Dysphagia, pharyngeal   PLAN:  -Cont PO linezolid and Amox/clav.  -Will need prolonged course (4-6 wks depending on clinical and radiographic resolution) of antimicrobial therapy given extensive necrosis -Cont chest percussion vest to facilitate mucus clearance -Cont nebulized steroids and beta agonist -He will need home O2 after discharge - possibly permanently -Cont PT -Suggest D/C PICC -Discharge planning per primary team   I have scheduled the following: Tues Dec17 @ 2:30 for CXR and @ 3:00 for appointment with Rubye Oaks, NP (both @ [facility]) He already has appt with Dr Kriste Basque on 2012/09/13 which he should keep also   PCCM will sign off. Please call if we can be of further assistance   Billy Fischer, MD ; [facility] [phone].  After 5:30 PM or weekends, call [phone]

## 2012-07-07 LAB — CBC
MCH: 29.6 pg (ref 26.0–34.0)
MCV: 87.5 fL (ref 78.0–100.0)
Platelets: 147 10*3/uL — ABNORMAL LOW (ref 150–400)
RBC: 5.13 MIL/uL (ref 4.22–5.81)
RDW: 13.6 % (ref 11.5–15.5)

## 2012-07-07 LAB — BASIC METABOLIC PANEL
CO2: 30 mEq/L (ref 19–32)
Calcium: 8.5 mg/dL (ref 8.4–10.5)
Creatinine, Ser: 0.96 mg/dL (ref 0.50–1.35)
Glucose, Bld: 125 mg/dL — ABNORMAL HIGH (ref 70–99)

## 2012-07-07 MED ORDER — SODIUM CHLORIDE 0.9 % IV BOLUS (SEPSIS)
500.0000 mL | Freq: Once | INTRAVENOUS | Status: AC
  Administered 2012-07-07: 500 mL via INTRAVENOUS

## 2012-07-07 NOTE — Progress Notes (Signed)
CSW provided patient with bed offers.  Christian [name] C. Ronnita Paz MSW, LCSW [phone]  

## 2012-07-07 NOTE — Progress Notes (Signed)
PT Cancellation Note  Patient Details Name: Christian [name] MRN: [id number] DOB: Apr 17, 1932   Cancelled Treatment:    Reason Eval/Treat Not Completed: Fatigue/lethargy limiting ability to participate pt reports he is just recovering from a period of low blood pressure and wants to rest   [name] 07/07/2012, 2:55 PM

## 2012-07-07 NOTE — Progress Notes (Signed)
Patient ID: Christian [name]  male  ZOX:[id number]    DOB: 02-10-1932    DOA: 06/27/2012  PCP: Michele Mcalpine, MD  Assessment/Plan: Principal Problem:  *Necrotizing pneumonia, community acquired MRSA - Continue Zyvox and augmentin, per pulmonary recommendations, will need 4-6 weeks of antibiotics. Given the patient was having worsening leukocytosis I discussed with ID, Dr. Orvan Falconer, who agreed with the choice of antibiotics and recommended 4 weeks of antibiotics.  Leukocytosis ? to acute illness vs steroids effect, Solumedrol was dc'ed on 07/04/12. Rule out C. difficile -  Patient had 2 episodes of diarrhea today, ordered C. difficile PCR  Active Problems:  COPD (chronic obstructive pulmonary disease) - Cont flutter valve, xopenex nebs, O2   Acute and chronic respiratory failure with hypoxia; as per #1 and #2  Pharyngeal dysphagia: - cont Dysphagia 3 diet with nectar thick liquids  GERD: cont PPI  Hypertension: stable  Valvular heart disease: known mild aortic stenosis, moderate mitral stenosis and moderate tricuspid regurgitation, per 2D Echocardiogram of 09/22/11 EF 55-60% . He follows up with Dr Willa Rough, cardiologist.  DVT Prophylaxis: SCD's  Code Status: DNR  Disposition: per family's request, CIR consult placed. However, they are open to SNF if he is not qualified. Updated the SW.     Subjective: Coughing, sitting on chair, 2 episodes of watery bowel movements today and 2 last night per patient  Objective: Weight change:   Intake/Output Summary (Last 24 hours) at 07/07/12 1408 Last data filed at 07/07/12 0801  Gross per 24 hour  Intake    280 ml  Output    525 ml  Net   -245 ml   Blood pressure 129/82, pulse 91, temperature 98.5 F (36.9 C), temperature source Oral, resp. rate 18, height 6' (1.829 m), weight 82.555 kg (182 lb), SpO2 93.00%.  Physical Exam: General: Alert and awake, oriented x3, not in any acute distress. HEENT: anicteric sclera, pupils reactive  to light and accommodation, EOMI CVS: S1-S2 clear, no murmur rubs or gallops Chest: diffuse rhonchi b/l Abdomen: soft nontender, nondistended, normal bowel sounds, no organomegaly Extremities: no cyanosis, clubbing or edema noted bilaterally Neuro: non focal  Lab Results: Basic Metabolic Panel:  Lab 07/07/12 8119 07/06/12 1110  NA 142 139  K 3.9 4.2  CL 107 104  CO2 30 27  GLUCOSE 125* 161*  BUN 52* 55*  CREATININE 0.96 1.04  CALCIUM 8.5 8.8  MG -- --  PHOS -- --   CBC:  Lab 07/07/12 0536 07/06/12 1110  WBC 26.2* 28.3*  NEUTROABS -- --  HGB 15.2 14.6  HCT 44.9 44.0  MCV 87.5 87.0  PLT 147* 175   CBG:  Lab 07/04/12 1129 07/04/12 0742 07/03/12 2124 07/03/12 1632 07/03/12 1203  GLUCAP 164* 115* 107* 128* 123*     Micro Results: Recent Results (from the past 240 hour(s))  CULTURE, RESPIRATORY     Status: Normal   Collection Time   07/01/12 11:20 PM      Component Value Range Status Comment   Specimen Description SPUTUM   Final    Special Requests NONE   Final    Gram Stain     Final    Value: ABUNDANT WBC PRESENT,BOTH PMN AND MONONUCLEAR     MODERATE SQUAMOUS EPITHELIAL CELLS PRESENT     MODERATE GRAM POSITIVE COCCI IN CLUSTERS     IN PAIRS IN CHAINS RARE GRAM POSITIVE RODS   Culture     Final    Value: MODERATE METHICILLIN RESISTANT STAPHYLOCOCCUS  AUREUS     Note: RIFAMPIN AND GENTAMICIN SHOULD NOT BE USED AS SINGLE DRUGS FOR TREATMENT OF STAPH INFECTIONS. This organism DOES NOT demonstrate inducible Clindamycin resistance in vitro. CRITICAL RESULT CALLED TO, READ BACK BY AND VERIFIED WITH: REEVES RN 9AM      07/04/12 GUSTK   Report Status 07/04/2012 FINAL   Final    Organism ID, Bacteria METHICILLIN RESISTANT STAPHYLOCOCCUS AUREUS   Final   MRSA PCR SCREENING     Status: Abnormal   Collection Time   07/02/12 10:58 AM      Component Value Range Status Comment   MRSA by PCR POSITIVE (*) NEGATIVE Final     Studies/Results: Dg Chest 2 View  07/06/2012   *RADIOLOGY REPORT*  Clinical Data: Lung infiltrates, follow-up, history of COPD and mediastinal melanoma  CHEST - 2 VIEW  Comparison: CT chest of 07/02/2012 and chest x-ray of 07/01/2012  Findings: The parenchymal opacities in the left lung apex, right lung base, and to a greater degree in the left lung base have not changed significantly.  There do appear to be small effusions present.  Mild cardiomegaly is stable.  Median sternotomy sutures are noted from prior CABG.  No acute bony abnormality is seen.  IMPRESSION: No change in somewhat nodular lung opacities bilaterally particularly in the left lung base with small effusions.   Original Report Authenticated By: Dwyane Dee, M.D.    Dg Chest 2 View  07/01/2012  *RADIOLOGY REPORT*  Clinical Data: Persistent shortness of breath, cough, and congestion.  CHEST - 2 VIEW  Comparison: 06/27/2012 and 04/13/2011  Findings: Stable cardiomegaly.  Changes of median sternotomy with surgical clips in the superior mediastinum.   There is there is chronic elevation of the left hemidiaphragm.  Significant change in aeration of the lungs compared to the chest radiograph of 06/27/2012.  There is multifocal patchy airspace disease, most prominent in the right lung base laterally and in the left upper lung field, with an prominent focal opacity just adjacent to the aortic arch.  Small bilateral pleural effusions are present.  IMPRESSION:  1.  New patchy and somewhat nodular bilateral airspace disease is worrisome for multifocal pneumonia.  There are small bilateral pleural effusions.  Recommend follow-up to clearing. 2.  Chronic elevation of the left hemidiaphragm.  The study is made a call report.   Original Report Authenticated By: Britta Mccreedy, M.D.    Dg Chest 2 View  06/27/2012  *RADIOLOGY REPORT*  Clinical Data: Cough, congestion, shortness of breath  CHEST - 2 VIEW  Comparison: 04/13/2011  Findings: Stable elevation of the left hemidiaphragm with associated left basilar  atelectasis / scarring.  Chronic interstitial markings without focal consolidation. No pleural effusion or pneumothorax.  The heart is top normal in size.  Postsurgical changes related to prior CABG.  Degenerative changes of the visualized thoracolumbar spine.  IMPRESSION: Chronic left basilar scarring/atelectasis.  No evidence of acute cardiopulmonary disease.   Original Report Authenticated By: Charline Bills, M.D.    Ct Chest Wo Contrast  07/03/2012  *RADIOLOGY REPORT*  Clinical Data: Abnormal chest x-ray with multifocal airspace disease and hemoptysis.  CT CHEST WITHOUT CONTRAST  Technique:  Multidetector CT imaging of the chest was performed following the standard protocol without IV contrast.  Comparison: Chest x-ray dated 07/01/2012.  Findings: There is extensive bilateral pneumonia.  In the left lung, extensive consolidation is seen in the upper lobe and lower lobe with areas of cavitation and necrosis present.  The left lower lobe bronchus  is occluded by material. There are also multiple nodules identified in the lower lobe and upper lobe adjacent areas of consolidation.  Some of these nodules are partially cavitary.  In the right lung, extensive consolidation is identified in the inferior lower lobe with multiple areas of cavitation and necrosis within densely consolidated lung.  Air bronchograms are also present in consolidated lung. A nodule is present in the posterior right upper lobe and patchy infiltrate is also present in the superior segment of the lower lobe.  A small amount of pleural fluid is present bilaterally, left greater than right.  The heart is enlarged and extensive calcifications are seen involving the aortic valve and mitral valve annulus.  No bony abnormalities.  IMPRESSION: Extensive bilateral cavitary pneumonia with areas of necrosis. Small associated bilateral pleural effusions.  Endobronchial material occludes the left lower lobe bronchus.  If an organism is not isolated by  sputum or blood, consider bronchoscopic evaluation.   Original Report Authenticated By: Irish Lack, M.D.    Dg Swallowing Func-speech Pathology  07/02/2012  Breck Coons [place], CCC-SLP     07/02/2012  3:29 PM Objective Swallowing Evaluation: Modified Barium Swallowing Study   Patient Details  Name: LAMERE LIGHTNER MRN: [id number] Date of Birth: 10-16-1931  Today's Date: 07/02/2012 Time: 4696-2952 SLP Time Calculation (min): 22 min  Past Medical History:  Past Medical History  Diagnosis Date  . Allergic rhinitis   . COPD (chronic obstructive pulmonary disease)   . Disorders of diaphragm   . Acute bronchitis   . Hypertension   . CAD (coronary artery disease)     Catheterization 2009, mild coronary disease (after abnormal  nuclear study,With hypotensive response to exercise, 2009)  . RBBB   . Atrial flutter     Ablated in the past, no recurrence  . Venous insufficiency   . Edema   . Dyslipidemia     Patient has an and to use statin  . GERD (gastroesophageal reflux disease)   . Diverticulosis of colon   . Hypertrophy of prostate with urinary obstruction and other  lower urinary tract symptoms (LUTS)   . Increased prostate specific antigen (PSA) velocity   . DJD (degenerative joint disease)   . Chronic insomnia   . Malignant melanoma     Removed from mediastinum via thoracotomy July, 2010  . Ejection fraction     EF 60%,Echo, 2009,  /  EF 55-60%, echo, February, 2013  . Aortic stenosis     Mild, echo and catheterization, 2009 /  Mild, echo, February,  2013  . Mitral stenosis     Very mild, echo, from mitral annular calcification  . Shortness of breath     Episodes of feeling shortness of breath with some mild  dizziness with mild exertion., February, 2013  . Tricuspid regurgitation     Moderate, echo, February, 2013, 56 mmHg  . Pulmonary hypertension     56 mmHg, echo, February, 2013   Past Surgical History:  Past Surgical History  Procedure Date  . Bilateral inguinal hernia repair   . Umbilical hernia repair   .  Median sternotomy for mediastinal melanoma 1980s   HPI:  78 yr old admitted with SOB with COPD exacerbation.  Initial CXR  11/25 revealed no active disease with repeat CXR 11/29 revealing  airspace disease worrisome for pna, bilateral pleural effusion.   PMH:  GERD, COPD, acute bronchists, HTN, CABG, allergic rhinitis,  disorders of diaphragm.  MBS recommended to recommend safest and  most efficient  po.     Assessment / Plan / Recommendation Clinical Impression  Dysphagia Diagnosis: Mild oral phase dysphagia;Moderate  pharyngeal phase dysphagia Clinical impression: Pt. demonstrated mild motor oral dysphagia  and moderate motor based pharyngeal dysphagia.  Mildly decreased  tongue to palatal contact with mild lingual residuals  intermittently observed.  Pharygneal phase described as reduced  laryngeal elevation and closure resulting in penetration during  and aspiration observed below vocal cords post swallow without  sensation.  Pt. did exhibit a significantly delayed cough that  may have been due to current pulmonary status versus sensation to  barium (?)  Chin tuck technique was attempted, however  ineffective in preventing vestibule entrance of barium.   Vallecular (mod) and pyriform sinus (mild-mod) residue present  with mild-mod clearance following verbal cues to perform second  swallow.  Despite potential for mildly increased of pharyngeal  residue with thicker liquids, recommend nectar thick consistency  as no penetration observed during swallow with nectar nor  penetration/aspiration post swallow from residuals.  Recommend  Dys 3 diet primarily for energy conservation.  Pt. likely with  chronic increased aspiration risk due to COPD which is heightened  when immune system is compromised due to illness.  SLP will  continue to follow for safety with diet and further pt. and  family education.    Treatment Recommendation  Therapy as outlined in treatment plan below    Diet Recommendation Dysphagia 3 (Mechanical  Soft);Nectar-thick  liquid   Liquid Administration via: Cup;No straw Medication Administration: Whole meds with puree Supervision: Patient able to self feed;Full supervision/cueing  for compensatory strategies Compensations: Slow rate;Small sips/bites;Multiple dry swallows  after each bite/sip;Follow solids with liquid;Clear throat  intermittently Postural Changes and/or Swallow Maneuvers: Seated upright 90  degrees;Upright 30-60 min after meal    Other  Recommendations Recommended Consults: MBS Oral Care Recommendations: Oral care BID Other Recommendations: Order thickener from pharmacy   Follow Up Recommendations   (home health if still needed at discharge)    Frequency and Duration min 2x/week  2 weeks       SLP Swallow Goals Patient will consume recommended diet without observed clinical  signs of aspiration with: Minimal cueing Patient will utilize recommended strategies during swallow to  increase swallowing safety with: Minimal cueing      Reason for Referral Objectively evaluate swallowing function   Oral Phase Oral Preparation/Oral Phase Oral Phase: Impaired Oral - Nectar Oral - Nectar Cup: Weak lingual manipulation Oral - Solids Oral - Regular: Weak lingual manipulation   Pharyngeal Phase Pharyngeal Phase Pharyngeal Phase: Impaired Pharyngeal - Thin Pharyngeal - Thin Teaspoon: Reduced tongue base  retraction;Reduced laryngeal elevation;Penetration/Aspiration  during swallow;Pharyngeal residue - valleculae;Pharyngeal residue  - pyriform sinuses Penetration/Aspiration details (thin teaspoon): Material enters  airway, CONTACTS cords and not ejected out Pharyngeal - Thin Cup: Pharyngeal residue - pyriform  sinuses;Pharyngeal residue - valleculae;Reduced laryngeal  elevation;Reduced tongue base retraction;Penetration/Aspiration  during swallow;Penetration/Aspiration after swallow Penetration/Aspiration details (thin cup): Material enters  airway, passes BELOW cords without attempt by patient to eject  out  (silent aspiration) Pharyngeal - Solids Pharyngeal - Puree: Pharyngeal residue - valleculae;Pharyngeal  residue - pyriform sinuses;Reduced laryngeal elevation;Reduced  tongue base retraction Pharyngeal - Regular: Reduced tongue base retraction;Pharyngeal  residue - valleculae  Cervical Esophageal Phase    Cervical Esophageal Phase Cervical Esophageal Phase: [facility]        Darrow Bussing.Ed CCC-SLP Pager [phone]  07/02/2012         Medications: Scheduled Meds:    .  amoxicillin-clavulanate  1 tablet Oral BID  . aspirin  325 mg Oral Daily  . Chlorhexidine Gluconate Cloth  6 each Topical Q0600  . darifenacin  7.5 mg Oral Daily  . dutasteride  0.5 mg Oral Daily  . etodolac  200 mg Oral BID  . famotidine  20 mg Oral QHS  . feeding supplement  1 Container Oral BID  . levalbuterol  0.63 mg Nebulization Q6H  . linezolid  600 mg Oral Q12H  . [COMPLETED] mupirocin ointment  1 application Nasal BID  . polyethylene glycol  17 g Oral Daily  . [COMPLETED] sodium chloride  500 mL Intravenous Once  . sodium chloride  3 mL Intravenous Q12H  . sodium chloride  3 mL Intravenous Q12H  . Tamsulosin HCl  0.4 mg Oral BID  . [DISCONTINUED] atenolol  25 mg Oral Daily  . [DISCONTINUED] pantoprazole  40 mg Oral Daily      LOS: 10 days   Dmitri Pettigrew M.D. [facility] 07/07/2012, 2:08 PM Pager: [phone]  If 7PM-7AM, please contact night-coverage [email or web address] Password [email or web address]

## 2012-07-07 NOTE — Progress Notes (Signed)
Occupational Therapy Treatment Patient Details Name: Christian [name] MRN: [id number] DOB: May 02, 1932 Today's Date: 07/07/2012 Time: 4098-1191 OT Time Calculation (min): 26 min  OT Assessment / Plan / Recommendation Comments on Treatment Session Pt performed grooming tasks and did part of them in sitting and part in standing. Still displays decreased activity tolerance wtih activity as he fatigues quickly.     Follow Up Recommendations  SNF    Barriers to Discharge       Equipment Recommendations  Rolling walker with 5" wheels    Recommendations for Other Services    Frequency Min 2X/week   Plan Discharge plan needs to be updated    Precautions / Restrictions Precautions Precautions: Fall Precaution Comments: monitor O2 Restrictions Weight Bearing Restrictions: No        ADL  Grooming: Performed;Wash/dry face;Set up;Other (comment);Min guard;Brushing hair (pt sat EOC to wash face/shampoo cap. Stood for teeth care) Where Assessed - Grooming: Unsupported sitting;Unsupported standing Toilet Transfer: Simulated;Min Pension scheme manager Method: Stand pivot ADL Comments: Pt transferred to chair. Sat EOC to wash face and have shampoo cap applied. He then combed hair. All setup assist. Then stood to brush teeth for about 1 minute only before needing to sit and rest. Granddaughter present for session. Sats 93% with activity on 3L. Pt coughing up mucous. Worked on the standing tolerance but still weak/limited.    OT Diagnosis:    OT Problem List:   OT Treatment Interventions:     OT Goals ADL Goals Pt Will Perform Grooming: with supervision;Standing at sink ADL Goal: Grooming - Progress: Progressing toward goals ADL Goal: Toilet Transfer - Progress: Progressing toward goals  Visit Information  Last OT Received On: 07/07/12 Assistance Needed: +1    Subjective Data  Subjective: I can work with you Patient Stated Goal: agreeable to OT; none stated   Prior Functioning        Cognition  Overall Cognitive Status: Appears within functional limits for tasks assessed/performed Arousal/Alertness: Awake/alert Orientation Level: Appears intact for tasks assessed Behavior During Session: [facility] for tasks performed    Mobility  Shoulder Instructions Bed Mobility Bed Mobility: Supine to Sit Supine to Sit: 5: Supervision;HOB elevated Transfers Transfers: Sit to Stand;Stand to Sit Sit to Stand: 4: Min guard;With upper extremity assist;From bed Stand to Sit: 4: Min guard;With upper extremity assist;To chair/3-in-1 Details for Transfer Assistance: min guard for safety       Exercises      Balance     End of Session OT - End of Session Activity Tolerance: Patient limited by fatigue Patient left: in chair;with call bell/phone within reach;with family/visitor present  GO     Lennox Laity [phone] 07/07/2012, 12:49 PM

## 2012-07-07 NOTE — Progress Notes (Signed)
Speech Language Pathology Dysphagia Treatment Patient Details Name: Christian [name] MRN: [id number] DOB: May 26, 1932 Today's Date: 07/07/2012 Time: 4098-1191 SLP Time Calculation (min): 27 min  Assessment / Plan / Recommendation Clinical Impression  Pt seen to assess tolerance of po diet.  Pt today only has thickened drinks at bedside.  He does not acknowledge improvement with swallow/cough when drinking nectar versus thin.  Observed pt to consume both consistencies and overt coughing noted with thin water.  Pt advised he dislikes he thickener.  Reeducated him to findings of MBS.  Pt initially stated he desired thin with known risk but after demonstration - his overt coughing after thin with expectoration of secretions, he stated he would "try to drink them."  Discussed regarding compromise of drinking thick drinks with meals and thin water (only water thin) between meals after oral care for comfort and incr pulmonary health.  Pt agreeable to this plan if MD approves.  SLP asked pt to monitor his meal tolerance closely tonight and will follow up tomorrow to determine if improved tolerance noted. Pt with multiple strategies to follow to maximize airway protection that may be difficult to implement.      Diet Recommendation  Continue with Current Diet: Dysphagia 3 (mechanical soft);Nectar-thick liquid (consider water protocol for comfort/QOL)    SLP Plan Continue with current plan of care   Pertinent Vitals/Pain Afebrile, rhonchi-nonproductive   Swallowing Goals  SLP Swallowing Goals Swallow Study Goal #3 - Progress: Progressing toward goal  General Temperature Spikes Noted: No Respiratory Status: Supplemental O2 delivered via (comment) Behavior/Cognition: Alert;Cooperative;Pleasant mood Oral Cavity - Dentition: Adequate natural dentition Patient Positioning: Upright in chair  Oral Cavity - Oral Hygiene   pt complains of xerostomia  Dysphagia Treatment Treatment focused on: Skilled  observation of diet tolerance;Patient/family/caregiver education Treatment Methods/Modalities: Skilled observation Type of PO's observed: Thin liquids;Nectar-thick liquids Feeding: Able to feed self Liquids provided via: Cup Pharyngeal Phase Signs & Symptoms: Wet vocal quality;Immediate cough (immediate cough after swallow of thin, not observed w/nectar) Type of cueing: Verbal Amount of cueing: Minimal   GO     Donavan Burnet, MS [facility] SLP [phone]

## 2012-07-08 DIAGNOSIS — R1313 Dysphagia, pharyngeal phase: Secondary | ICD-10-CM

## 2012-07-08 DIAGNOSIS — J309 Allergic rhinitis, unspecified: Secondary | ICD-10-CM

## 2012-07-08 LAB — CBC
HCT: 42 % (ref 39.0–52.0)
MCH: 29.3 pg (ref 26.0–34.0)
MCHC: 32.9 g/dL (ref 30.0–36.0)
RDW: 13.8 % (ref 11.5–15.5)

## 2012-07-08 LAB — BASIC METABOLIC PANEL
BUN: 49 mg/dL — ABNORMAL HIGH (ref 6–23)
Chloride: 108 mEq/L (ref 96–112)
Creatinine, Ser: 0.95 mg/dL (ref 0.50–1.35)
GFR calc Af Amer: >90 mL/min (ref >90)
Glucose, Bld: 117 mg/dL — ABNORMAL HIGH (ref 70–99)

## 2012-07-08 MED ORDER — LEVALBUTEROL HCL 0.63 MG/3ML IN NEBU
0.6300 mg | INHALATION_SOLUTION | RESPIRATORY_TRACT | Status: DC | PRN
  Filled 2012-07-08: qty 3

## 2012-07-08 NOTE — Progress Notes (Addendum)
Speech Language Pathology Dysphagia Treatment Patient Details Name: Christian [name] MRN: [id number] DOB: [birth year] Today's Date: 07/08/2012 Time: 4098-1191 SLP Time Calculation (min): 23 min  Assessment / Plan / Recommendation Clinical Impression  Pt received call from MD with request to see pt to check for possible diet advancement as pt continues to complain of sensation of dehydration.   SLP observed pt with nectar Boost and thin water, unfortunately he continues to overtly cough and aspirate thin water.  Pt expressed frustration saying he feels this SLP is "badgering him".  SLP expressed empathy as pt continues with dysphagia and aspiration was occuring prior to admission. Discussed option again of thin water between meals and nectar with.  Pt agreeable at this time to pursue this plan.  With pt's level of COPD, he is at CHRONIC asp risk and educated him to that information.  Purpose of copious strategies *that may be hard to adhere to* is to decr amount he may aspirate.    SLP to follow up next week but do not anticipate pt will drink nectar with meals and MD may desire to honor wishes and liberalize diet.  Thanks.     Diet Recommendation  Initiate / Change Diet: Dysphagia 3 (mechanical soft);Nectar-thick liquid (frazier protocol)    SLP Plan Continue with current plan of care   Pertinent Vitals/Pain Afebrile, congested   Swallowing Goals  SLP Swallowing Goals Swallow Study Goal #3 - Progress: Progressing toward goal  General Temperature Spikes Noted: No Respiratory Status: Supplemental O2 delivered via (comment) Behavior/Cognition: Alert;Cooperative;Other (comment) (frustrated after slp conversation and observation of asp) Oral Cavity - Dentition: Adequate natural dentition Patient Positioning: Upright in bed    Dysphagia Treatment Treatment focused on: Skilled observation of diet tolerance Treatment Methods/Modalities: Skilled observation Type of PO's observed: Nectar-thick  liquids;Thin liquids Feeding: Able to feed self Liquids provided via: Cup Oral Phase Signs & Symptoms:  (pt declined to have solids or applesauce) Pharyngeal Phase Signs & Symptoms: Immediate cough Type of cueing: Verbal Amount of cueing: Minimal   GO     Donavan Burnet, MS [facility] SLP [phone]     Pt states he is coughing even with nectar liquids during meals but unable to quantify versus thin water.

## 2012-07-08 NOTE — Progress Notes (Signed)
Physical Therapy Treatment Patient Details Name: JAIRE PINKHAM MRN: [id number] DOB: 11/07/1931 Today's Date: 07/08/2012 Time: 4098-1191 PT Time Calculation (min): 24 min  PT Assessment / Plan / Recommendation Comments on Treatment Session  Pt progressing slowly.  Assisted OOB to amb in hallway on 3 lts O2 then assisted to BR.  Assisted pt back to bed per pt request.    Follow Up Recommendations  SNF     Does the patient have the potential to tolerate intense rehabilitation     Barriers to Discharge        Equipment Recommendations  Rolling walker with 5" wheels    Recommendations for Other Services    Frequency Min 3X/week   Plan Discharge plan remains appropriate    Precautions / Restrictions Precautions Precautions: Fall Precaution Comments: 3 lts O2 Restrictions Weight Bearing Restrictions: No   Pertinent Vitals/Pain No c/o pain    Mobility  Bed Mobility Bed Mobility: Supine to Sit;Sit to Supine Supine to Sit: 5: Supervision Sit to Supine: 5: Supervision Details for Bed Mobility Assistance: increased time Transfers Transfers: Sit to Stand;Stand to Sit Sit to Stand: 4: Min guard;5: Supervision;From bed;From toilet Stand to Sit: 5: Supervision;4: Min guard;To toilet;To bed Details for Transfer Assistance: increased time and one VC on hand placement Ambulation/Gait Ambulation/Gait Assistance: 4: Min guard Ambulation Distance (Feet): 180 Feet Assistive device: Rolling walker Ambulation/Gait Assistance Details: <25% VC's on safety with turns and backward gait. Gait Pattern: Step-through pattern Gait velocity: increased time     PT Goals                                                        progressing    Visit Information  Last PT Received On: 07/08/12 Assistance Needed: +1    Subjective Data      Cognition       Balance     End of Session PT - End of Session Equipment Utilized During Treatment: Gait belt Activity Tolerance: Patient tolerated  treatment well Patient left: in bed;with call bell/phone within reach;with family/visitor present Nurse Communication: Mobility status   Felecia Shelling  PTA [facility]  Acute  Rehab Pager     [phone]

## 2012-07-08 NOTE — Progress Notes (Signed)
Patient ID: Christian [name]  male  AVW:[id number]    DOB: 1932/06/27    DOA: 06/27/2012  PCP: Michele Mcalpine, MD  Assessment/Plan: Principal Problem:  *Necrotizing pneumonia, community acquired MRSA: breathing improving, still productive phlegm - Continue Zyvox and augmentin, per pulmonary recommendations, will need 4-6 weeks of antibiotics. Given the patient was having worsening leukocytosis I discussed with ID, Dr. Orvan Falconer, who agreed with the choice of antibiotics and recommended 4 weeks of antibiotics.  Leukocytosis ? to acute illness vs steroids effect vs leukemoid reaction, Solumedrol was dc'ed on 07/04/12. Improving, WBC 22.6,  -  C-Diff is negative  Hypotension: Systolic BP in 80s yesterday, discontinued atenolol yesterday, IV fluid bolus was given which did improve his BP. Patient feels dehydrated and requesting water, asked speech therapy to see patient again if he qualifies for liquids.   COPD (chronic obstructive pulmonary disease) - Cont flutter valve, xopenex nebs, O2   Acute and chronic respiratory failure with hypoxia; as per #1 and #2  Pharyngeal dysphagia: - cont Dysphagia 3 diet with nectar thick liquids. Per family's request, asked speech therapy to see patient again today.  GERD: cont PPI  Hypertension: stable  Valvular heart disease: known mild aortic stenosis, moderate mitral stenosis and moderate tricuspid regurgitation, per 2D Echocardiogram of 09/22/11 EF 55-60% . He follows up with Dr Willa Rough, cardiologist.  DVT Prophylaxis: SCD's  Code Status: DNR  Disposition: per family's request, CIR consult placed. However, they are open to SNF if he is not qualified. Updated the SW.     Subjective: Coughing, sitting on chair, 2 episodes of watery bowel movements today and 2 last night per patient  Objective: Weight change:   Intake/Output Summary (Last 24 hours) at 07/08/12 1443 Last data filed at 07/08/12 1300  Gross per 24 hour  Intake   1060 ml  Output     525 ml  Net    535 ml   Blood pressure 116/61, pulse 87, temperature 98.8 F (37.1 C), temperature source Oral, resp. rate 20, height 6' (1.829 m), weight 82.555 kg (182 lb), SpO2 95.00%.  Physical Exam: General: Alert and awake, oriented x3, not in any acute distress. HEENT: anicteric sclera, pupils reactive to light and accommodation, EOMI CVS: S1-S2 clear, no murmur rubs or gallops Chest: diffuse rhonchi b/l but somewhat improving from yesterday Abdomen: soft nontender, nondistended, normal bowel sounds, no organomegaly Extremities: no cyanosis, clubbing or edema noted bilaterally Neuro: non focal  Lab Results: Basic Metabolic Panel:  Lab 07/08/12 8295 07/07/12 0536  NA 142 142  K 3.9 3.9  CL 108 107  CO2 29 30  GLUCOSE 117* 125*  BUN 49* 52*  CREATININE 0.95 0.96  CALCIUM 8.4 8.5  MG -- --  PHOS -- --   CBC:  Lab 07/08/12 0455 07/07/12 0536  WBC 22.6* 26.2*  NEUTROABS -- --  HGB 13.8 15.2  HCT 42.0 44.9  MCV 89.2 87.5  PLT 132* 147*   CBG:  Lab 07/04/12 1129 07/04/12 0742 07/03/12 2124 07/03/12 1632 07/03/12 1203  GLUCAP 164* 115* 107* 128* 123*     Micro Results: Recent Results (from the past 240 hour(s))  CULTURE, RESPIRATORY     Status: Normal   Collection Time   07/01/12 11:20 PM      Component Value Range Status Comment   Specimen Description SPUTUM   Final    Special Requests NONE   Final    Gram Stain     Final    Value: ABUNDANT  WBC PRESENT,BOTH PMN AND MONONUCLEAR     MODERATE SQUAMOUS EPITHELIAL CELLS PRESENT     MODERATE GRAM POSITIVE COCCI IN CLUSTERS     IN PAIRS IN CHAINS RARE GRAM POSITIVE RODS   Culture     Final    Value: MODERATE METHICILLIN RESISTANT STAPHYLOCOCCUS AUREUS     Note: RIFAMPIN AND GENTAMICIN SHOULD NOT BE USED AS SINGLE DRUGS FOR TREATMENT OF STAPH INFECTIONS. This organism DOES NOT demonstrate inducible Clindamycin resistance in vitro. CRITICAL RESULT CALLED TO, READ BACK BY AND VERIFIED WITH: REEVES RN 9AM       07/04/12 GUSTK   Report Status 07/04/2012 FINAL   Final    Organism ID, Bacteria METHICILLIN RESISTANT STAPHYLOCOCCUS AUREUS   Final   MRSA PCR SCREENING     Status: Abnormal   Collection Time   07/02/12 10:58 AM      Component Value Range Status Comment   MRSA by PCR POSITIVE (*) NEGATIVE Final   CLOSTRIDIUM DIFFICILE BY PCR     Status: Normal   Collection Time   07/07/12  1:05 PM      Component Value Range Status Comment   C difficile by pcr NEGATIVE  NEGATIVE Final     Studies/Results: Dg Chest 2 View  07/06/2012  *RADIOLOGY REPORT*  Clinical Data: Lung infiltrates, follow-up, history of COPD and mediastinal melanoma  CHEST - 2 VIEW  Comparison: CT chest of 07/02/2012 and chest x-ray of 07/01/2012  Findings: The parenchymal opacities in the left lung apex, right lung base, and to a greater degree in the left lung base have not changed significantly.  There do appear to be small effusions present.  Mild cardiomegaly is stable.  Median sternotomy sutures are noted from prior CABG.  No acute bony abnormality is seen.  IMPRESSION: No change in somewhat nodular lung opacities bilaterally particularly in the left lung base with small effusions.   Original Report Authenticated By: Dwyane Dee, M.D.    Dg Chest 2 View  07/01/2012  *RADIOLOGY REPORT*  Clinical Data: Persistent shortness of breath, cough, and congestion.  CHEST - 2 VIEW  Comparison: 06/27/2012 and 04/13/2011  Findings: Stable cardiomegaly.  Changes of median sternotomy with surgical clips in the superior mediastinum.   There is there is chronic elevation of the left hemidiaphragm.  Significant change in aeration of the lungs compared to the chest radiograph of 06/27/2012.  There is multifocal patchy airspace disease, most prominent in the right lung base laterally and in the left upper lung field, with an prominent focal opacity just adjacent to the aortic arch.  Small bilateral pleural effusions are present.  IMPRESSION:  1.  New patchy  and somewhat nodular bilateral airspace disease is worrisome for multifocal pneumonia.  There are small bilateral pleural effusions.  Recommend follow-up to clearing. 2.  Chronic elevation of the left hemidiaphragm.  The study is made a call report.   Original Report Authenticated By: Britta Mccreedy, M.D.    Dg Chest 2 View  06/27/2012  *RADIOLOGY REPORT*  Clinical Data: Cough, congestion, shortness of breath  CHEST - 2 VIEW  Comparison: 04/13/2011  Findings: Stable elevation of the left hemidiaphragm with associated left basilar atelectasis / scarring.  Chronic interstitial markings without focal consolidation. No pleural effusion or pneumothorax.  The heart is top normal in size.  Postsurgical changes related to prior CABG.  Degenerative changes of the visualized thoracolumbar spine.  IMPRESSION: Chronic left basilar scarring/atelectasis.  No evidence of acute cardiopulmonary disease.   Original Report  Authenticated By: Charline Bills, M.D.    Ct Chest Wo Contrast  07/03/2012  *RADIOLOGY REPORT*  Clinical Data: Abnormal chest x-ray with multifocal airspace disease and hemoptysis.  CT CHEST WITHOUT CONTRAST  Technique:  Multidetector CT imaging of the chest was performed following the standard protocol without IV contrast.  Comparison: Chest x-ray dated 07/01/2012.  Findings: There is extensive bilateral pneumonia.  In the left lung, extensive consolidation is seen in the upper lobe and lower lobe with areas of cavitation and necrosis present.  The left lower lobe bronchus is occluded by material. There are also multiple nodules identified in the lower lobe and upper lobe adjacent areas of consolidation.  Some of these nodules are partially cavitary.  In the right lung, extensive consolidation is identified in the inferior lower lobe with multiple areas of cavitation and necrosis within densely consolidated lung.  Air bronchograms are also present in consolidated lung. A nodule is present in the posterior  right upper lobe and patchy infiltrate is also present in the superior segment of the lower lobe.  A small amount of pleural fluid is present bilaterally, left greater than right.  The heart is enlarged and extensive calcifications are seen involving the aortic valve and mitral valve annulus.  No bony abnormalities.  IMPRESSION: Extensive bilateral cavitary pneumonia with areas of necrosis. Small associated bilateral pleural effusions.  Endobronchial material occludes the left lower lobe bronchus.  If an organism is not isolated by sputum or blood, consider bronchoscopic evaluation.   Original Report Authenticated By: Irish Lack, M.D.    Dg Swallowing Func-speech Pathology  07/02/2012  Breck Coons [place], CCC-SLP     07/02/2012  3:29 PM Objective Swallowing Evaluation: Modified Barium Swallowing Study   Patient Details  Name: Christian [name] MRN: [id number] Date of Birth: 1932/02/24  Today's Date: 07/02/2012 Time: 7829-5621 SLP Time Calculation (min): 22 min  Past Medical History:  Past Medical History  Diagnosis Date  . Allergic rhinitis   . COPD (chronic obstructive pulmonary disease)   . Disorders of diaphragm   . Acute bronchitis   . Hypertension   . CAD (coronary artery disease)     Catheterization 2009, mild coronary disease (after abnormal  nuclear study,With hypotensive response to exercise, 2009)  . RBBB   . Atrial flutter     Ablated in the past, no recurrence  . Venous insufficiency   . Edema   . Dyslipidemia     Patient has an and to use statin  . GERD (gastroesophageal reflux disease)   . Diverticulosis of colon   . Hypertrophy of prostate with urinary obstruction and other  lower urinary tract symptoms (LUTS)   . Increased prostate specific antigen (PSA) velocity   . DJD (degenerative joint disease)   . Chronic insomnia   . Malignant melanoma     Removed from mediastinum via thoracotomy July, 2010  . Ejection fraction     EF 60%,Echo, 2009,  /  EF 55-60%, echo, February, 2013  . Aortic stenosis      Mild, echo and catheterization, 2009 /  Mild, echo, February,  2013  . Mitral stenosis     Very mild, echo, from mitral annular calcification  . Shortness of breath     Episodes of feeling shortness of breath with some mild  dizziness with mild exertion., February, 2013  . Tricuspid regurgitation     Moderate, echo, February, 2013, 56 mmHg  . Pulmonary hypertension     56 mmHg, echo, February, 2013  Past Surgical History:  Past Surgical History  Procedure Date  . Bilateral inguinal hernia repair   . Umbilical hernia repair   . Median sternotomy for mediastinal melanoma 1980s   HPI:  78 yr old admitted with SOB with COPD exacerbation.  Initial CXR  11/25 revealed no active disease with repeat CXR 11/29 revealing  airspace disease worrisome for pna, bilateral pleural effusion.   PMH:  GERD, COPD, acute bronchists, HTN, CABG, allergic rhinitis,  disorders of diaphragm.  MBS recommended to recommend safest and  most efficient po.     Assessment / Plan / Recommendation Clinical Impression  Dysphagia Diagnosis: Mild oral phase dysphagia;Moderate  pharyngeal phase dysphagia Clinical impression: Pt. demonstrated mild motor oral dysphagia  and moderate motor based pharyngeal dysphagia.  Mildly decreased  tongue to palatal contact with mild lingual residuals  intermittently observed.  Pharygneal phase described as reduced  laryngeal elevation and closure resulting in penetration during  and aspiration observed below vocal cords post swallow without  sensation.  Pt. did exhibit a significantly delayed cough that  may have been due to current pulmonary status versus sensation to  barium (?)  Chin tuck technique was attempted, however  ineffective in preventing vestibule entrance of barium.   Vallecular (mod) and pyriform sinus (mild-mod) residue present  with mild-mod clearance following verbal cues to perform second  swallow.  Despite potential for mildly increased of pharyngeal  residue with thicker liquids, recommend  nectar thick consistency  as no penetration observed during swallow with nectar nor  penetration/aspiration post swallow from residuals.  Recommend  Dys 3 diet primarily for energy conservation.  Pt. likely with  chronic increased aspiration risk due to COPD which is heightened  when immune system is compromised due to illness.  SLP will  continue to follow for safety with diet and further pt. and  family education.    Treatment Recommendation  Therapy as outlined in treatment plan below    Diet Recommendation Dysphagia 3 (Mechanical Soft);Nectar-thick  liquid   Liquid Administration via: Cup;No straw Medication Administration: Whole meds with puree Supervision: Patient able to self feed;Full supervision/cueing  for compensatory strategies Compensations: Slow rate;Small sips/bites;Multiple dry swallows  after each bite/sip;Follow solids with liquid;Clear throat  intermittently Postural Changes and/or Swallow Maneuvers: Seated upright 90  degrees;Upright 30-60 min after meal    Other  Recommendations Recommended Consults: MBS Oral Care Recommendations: Oral care BID Other Recommendations: Order thickener from pharmacy   Follow Up Recommendations   (home health if still needed at discharge)    Frequency and Duration min 2x/week  2 weeks       SLP Swallow Goals Patient will consume recommended diet without observed clinical  signs of aspiration with: Minimal cueing Patient will utilize recommended strategies during swallow to  increase swallowing safety with: Minimal cueing      Reason for Referral Objectively evaluate swallowing function   Oral Phase Oral Preparation/Oral Phase Oral Phase: Impaired Oral - Nectar Oral - Nectar Cup: Weak lingual manipulation Oral - Solids Oral - Regular: Weak lingual manipulation   Pharyngeal Phase Pharyngeal Phase Pharyngeal Phase: Impaired Pharyngeal - Thin Pharyngeal - Thin Teaspoon: Reduced tongue base  retraction;Reduced laryngeal elevation;Penetration/Aspiration  during  swallow;Pharyngeal residue - valleculae;Pharyngeal residue  - pyriform sinuses Penetration/Aspiration details (thin teaspoon): Material enters  airway, CONTACTS cords and not ejected out Pharyngeal - Thin Cup: Pharyngeal residue - pyriform  sinuses;Pharyngeal residue - valleculae;Reduced laryngeal  elevation;Reduced tongue base retraction;Penetration/Aspiration  during swallow;Penetration/Aspiration after swallow Penetration/Aspiration details (thin  cup): Material enters  airway, passes BELOW cords without attempt by patient to eject  out (silent aspiration) Pharyngeal - Solids Pharyngeal - Puree: Pharyngeal residue - valleculae;Pharyngeal  residue - pyriform sinuses;Reduced laryngeal elevation;Reduced  tongue base retraction Pharyngeal - Regular: Reduced tongue base retraction;Pharyngeal  residue - valleculae  Cervical Esophageal Phase    Cervical Esophageal Phase Cervical Esophageal Phase: [facility]        Darrow Bussing.Ed CCC-SLP Pager [phone]  07/02/2012         Medications: Scheduled Meds:    . amoxicillin-clavulanate  1 tablet Oral BID  . aspirin  325 mg Oral Daily  . darifenacin  7.5 mg Oral Daily  . dutasteride  0.5 mg Oral Daily  . etodolac  200 mg Oral BID  . famotidine  20 mg Oral QHS  . feeding supplement  1 Container Oral BID  . levalbuterol  0.63 mg Nebulization Q6H  . linezolid  600 mg Oral Q12H  . polyethylene glycol  17 g Oral Daily  . sodium chloride  3 mL Intravenous Q12H  . sodium chloride  3 mL Intravenous Q12H  . Tamsulosin HCl  0.4 mg Oral BID      LOS: 11 days   RAI,RIPUDEEP M.D. [facility] 07/08/2012, 2:43 PM Pager: [phone]  If 7PM-7AM, please contact night-coverage [email or web address] Password [email or web address]

## 2012-07-09 DIAGNOSIS — E785 Hyperlipidemia, unspecified: Secondary | ICD-10-CM

## 2012-07-09 DIAGNOSIS — R0989 Other specified symptoms and signs involving the circulatory and respiratory systems: Secondary | ICD-10-CM

## 2012-07-09 LAB — CBC
MCH: 29.4 pg (ref 26.0–34.0)
MCV: 88.5 fL (ref 78.0–100.0)
Platelets: 122 10*3/uL — ABNORMAL LOW (ref 150–400)
RDW: 13.8 % (ref 11.5–15.5)
WBC: 19.5 10*3/uL — ABNORMAL HIGH (ref 4.0–10.5)

## 2012-07-09 LAB — BASIC METABOLIC PANEL
CO2: 27 mEq/L (ref 19–32)
Calcium: 8.3 mg/dL — ABNORMAL LOW (ref 8.4–10.5)
Creatinine, Ser: 0.94 mg/dL (ref 0.50–1.35)
Glucose, Bld: 109 mg/dL — ABNORMAL HIGH (ref 70–99)

## 2012-07-09 MED ORDER — MAGIC MOUTHWASH
5.0000 mL | Freq: Four times a day (QID) | ORAL | Status: DC
  Administered 2012-07-09 – 2012-07-12 (×11): 5 mL via ORAL
  Filled 2012-07-09 (×14): qty 5

## 2012-07-09 MED ORDER — POLYVINYL ALCOHOL 1.4 % OP SOLN
2.0000 [drp] | OPHTHALMIC | Status: DC | PRN
  Administered 2012-07-09: 2 [drp] via OPHTHALMIC
  Filled 2012-07-09: qty 15

## 2012-07-09 NOTE — Progress Notes (Signed)
Patient ID: Christian [name]  male  ZOX:[id number]    DOB: 1931/09/30    DOA: 06/27/2012  PCP: Michele Mcalpine, MD  Assessment/Plan: Principal Problem:  *Necrotizing pneumonia, community acquired MRSA: breathing improving, still productive phlegm - Continue Zyvox and augmentin, per pulmonary recommendations, will need 4-6 weeks of antibiotics. Given the patient was having worsening leukocytosis I discussed with ID, Dr. Orvan Falconer, who agreed with the choice of antibiotics and recommended 4 weeks of antibiotics.  Leukocytosis: trending down, ? to acute illness vs steroids effect vs leukemoid reaction, Solumedrol was dc'ed on 07/04/12. Per patient, stools soft, no diarrhea anymore -  C-Diff is negative  Hypotension: BP now stable.   COPD (chronic obstructive pulmonary disease) - Cont flutter valve, xopenex nebs, O2   Acute and chronic respiratory failure with hypoxia; as per #1 and #2  Pharyngeal dysphagia: - cont Dysphagia 3 diet with nectar thick liquids.   GERD: cont PPI  Hypertension: stable  Valvular heart disease: known mild aortic stenosis, moderate mitral stenosis and moderate tricuspid regurgitation, per 2D Echocardiogram of 09/22/11 EF 55-60% . He follows up with Dr Willa Rough, cardiologist.  DVT Prophylaxis: SCD's  Code Status: DNR  Disposition: needs SNF, likely on Monday    Subjective: Feeling a whole lot better today, patient states that bowel movements are now soft, no diarrhea anymore. Coughing better  Objective: Weight change:   Intake/Output Summary (Last 24 hours) at 07/09/12 1059 Last data filed at 07/09/12 0745  Gross per 24 hour  Intake    600 ml  Output    481 ml  Net    119 ml   Blood pressure 114/69, pulse 90, temperature 98.5 F (36.9 C), temperature source Oral, resp. rate 18, height 6' (1.829 m), weight 82.555 kg (182 lb), SpO2 96.00%.  Physical Exam: General: Alert and awake, oriented x3, NAD. HEENT: anicteric sclera, PERLA, EOMI CVS: S1-S2  clear, no murmur rubs or gallops Chest: diffuse rhonchi b/l significantly improved from the last few days  Abdomen: soft nontender, nondistended, normal bowel sounds, no organomegaly Extremities: no cyanosis, clubbing or edema noted bilaterally Neuro: non focal  Lab Results: Basic Metabolic Panel:  Lab 07/09/12 8119 07/08/12 0455  NA 140 142  K 3.7 3.9  CL 107 108  CO2 27 29  GLUCOSE 109* 117*  BUN 47* 49*  CREATININE 0.94 0.95  CALCIUM 8.3* 8.4  MG -- --  PHOS -- --   CBC:  Lab 07/09/12 0550 07/08/12 0455  WBC 19.5* 22.6*  NEUTROABS -- --  HGB 14.1 13.8  HCT 42.5 42.0  MCV 88.5 89.2  PLT 122* 132*   CBG:  Lab 07/04/12 1129 07/04/12 0742 07/03/12 2124 07/03/12 1632 07/03/12 1203  GLUCAP 164* 115* 107* 128* 123*     Micro Results: Recent Results (from the past 240 hour(s))  CULTURE, RESPIRATORY     Status: Normal   Collection Time   07/01/12 11:20 PM      Component Value Range Status Comment   Specimen Description SPUTUM   Final    Special Requests NONE   Final    Gram Stain     Final    Value: ABUNDANT WBC PRESENT,BOTH PMN AND MONONUCLEAR     MODERATE SQUAMOUS EPITHELIAL CELLS PRESENT     MODERATE GRAM POSITIVE COCCI IN CLUSTERS     IN PAIRS IN CHAINS RARE GRAM POSITIVE RODS   Culture     Final    Value: MODERATE METHICILLIN RESISTANT STAPHYLOCOCCUS AUREUS  Note: RIFAMPIN AND GENTAMICIN SHOULD NOT BE USED AS SINGLE DRUGS FOR TREATMENT OF STAPH INFECTIONS. This organism DOES NOT demonstrate inducible Clindamycin resistance in vitro. CRITICAL RESULT CALLED TO, READ BACK BY AND VERIFIED WITH: REEVES RN 9AM      07/04/12 GUSTK   Report Status 07/04/2012 FINAL   Final    Organism ID, Bacteria METHICILLIN RESISTANT STAPHYLOCOCCUS AUREUS   Final   MRSA PCR SCREENING     Status: Abnormal   Collection Time   07/02/12 10:58 AM      Component Value Range Status Comment   MRSA by PCR POSITIVE (*) NEGATIVE Final   CLOSTRIDIUM DIFFICILE BY PCR     Status: Normal    Collection Time   07/07/12  1:05 PM      Component Value Range Status Comment   C difficile by pcr NEGATIVE  NEGATIVE Final     Studies/Results: Dg Chest 2 View  07/06/2012  *RADIOLOGY REPORT*  Clinical Data: Lung infiltrates, follow-up, history of COPD and mediastinal melanoma  CHEST - 2 VIEW  Comparison: CT chest of 07/02/2012 and chest x-ray of 07/01/2012  Findings: The parenchymal opacities in the left lung apex, right lung base, and to a greater degree in the left lung base have not changed significantly.  There do appear to be small effusions present.  Mild cardiomegaly is stable.  Median sternotomy sutures are noted from prior CABG.  No acute bony abnormality is seen.  IMPRESSION: No change in somewhat nodular lung opacities bilaterally particularly in the left lung base with small effusions.   Original Report Authenticated By: Dwyane Dee, M.D.    Dg Chest 2 View  07/01/2012  *RADIOLOGY REPORT*  Clinical Data: Persistent shortness of breath, cough, and congestion.  CHEST - 2 VIEW  Comparison: 06/27/2012 and 04/13/2011  Findings: Stable cardiomegaly.  Changes of median sternotomy with surgical clips in the superior mediastinum.   There is there is chronic elevation of the left hemidiaphragm.  Significant change in aeration of the lungs compared to the chest radiograph of 06/27/2012.  There is multifocal patchy airspace disease, most prominent in the right lung base laterally and in the left upper lung field, with an prominent focal opacity just adjacent to the aortic arch.  Small bilateral pleural effusions are present.  IMPRESSION:  1.  New patchy and somewhat nodular bilateral airspace disease is worrisome for multifocal pneumonia.  There are small bilateral pleural effusions.  Recommend follow-up to clearing. 2.  Chronic elevation of the left hemidiaphragm.  The study is made a call report.   Original Report Authenticated By: Britta Mccreedy, M.D.    Dg Chest 2 View  06/27/2012  *RADIOLOGY  REPORT*  Clinical Data: Cough, congestion, shortness of breath  CHEST - 2 VIEW  Comparison: 04/13/2011  Findings: Stable elevation of the left hemidiaphragm with associated left basilar atelectasis / scarring.  Chronic interstitial markings without focal consolidation. No pleural effusion or pneumothorax.  The heart is top normal in size.  Postsurgical changes related to prior CABG.  Degenerative changes of the visualized thoracolumbar spine.  IMPRESSION: Chronic left basilar scarring/atelectasis.  No evidence of acute cardiopulmonary disease.   Original Report Authenticated By: Charline Bills, M.D.    Ct Chest Wo Contrast  07/03/2012  *RADIOLOGY REPORT*  Clinical Data: Abnormal chest x-ray with multifocal airspace disease and hemoptysis.  CT CHEST WITHOUT CONTRAST  Technique:  Multidetector CT imaging of the chest was performed following the standard protocol without IV contrast.  Comparison: Chest x-ray dated  07/01/2012.  Findings: There is extensive bilateral pneumonia.  In the left lung, extensive consolidation is seen in the upper lobe and lower lobe with areas of cavitation and necrosis present.  The left lower lobe bronchus is occluded by material. There are also multiple nodules identified in the lower lobe and upper lobe adjacent areas of consolidation.  Some of these nodules are partially cavitary.  In the right lung, extensive consolidation is identified in the inferior lower lobe with multiple areas of cavitation and necrosis within densely consolidated lung.  Air bronchograms are also present in consolidated lung. A nodule is present in the posterior right upper lobe and patchy infiltrate is also present in the superior segment of the lower lobe.  A small amount of pleural fluid is present bilaterally, left greater than right.  The heart is enlarged and extensive calcifications are seen involving the aortic valve and mitral valve annulus.  No bony abnormalities.  IMPRESSION: Extensive bilateral  cavitary pneumonia with areas of necrosis. Small associated bilateral pleural effusions.  Endobronchial material occludes the left lower lobe bronchus.  If an organism is not isolated by sputum or blood, consider bronchoscopic evaluation.   Original Report Authenticated By: Irish Lack, M.D.    Dg Swallowing Func-speech Pathology  07/02/2012  Breck Coons [place], CCC-SLP     07/02/2012  3:29 PM Objective Swallowing Evaluation: Modified Barium Swallowing Study   Patient Details  Name: Christian [name] MRN: [id number] Date of Birth: [birth year]  Today's Date: 07/02/2012 Time: 7829-5621 SLP Time Calculation (min): 22 min  Past Medical History:  Past Medical History  Diagnosis Date  . Allergic rhinitis   . COPD (chronic obstructive pulmonary disease)   . Disorders of diaphragm   . Acute bronchitis   . Hypertension   . CAD (coronary artery disease)     Catheterization 2009, mild coronary disease (after abnormal  nuclear study,With hypotensive response to exercise, 2009)  . RBBB   . Atrial flutter     Ablated in the past, no recurrence  . Venous insufficiency   . Edema   . Dyslipidemia     Patient has an and to use statin  . GERD (gastroesophageal reflux disease)   . Diverticulosis of colon   . Hypertrophy of prostate with urinary obstruction and other  lower urinary tract symptoms (LUTS)   . Increased prostate specific antigen (PSA) velocity   . DJD (degenerative joint disease)   . Chronic insomnia   . Malignant melanoma     Removed from mediastinum via thoracotomy July, 2010  . Ejection fraction     EF 60%,Echo, 2009,  /  EF 55-60%, echo, February, 2013  . Aortic stenosis     Mild, echo and catheterization, 2009 /  Mild, echo, February,  2013  . Mitral stenosis     Very mild, echo, from mitral annular calcification  . Shortness of breath     Episodes of feeling shortness of breath with some mild  dizziness with mild exertion., February, 2013  . Tricuspid regurgitation     Moderate, echo, February, 2013, 56 mmHg  .  Pulmonary hypertension     56 mmHg, echo, February, 2013   Past Surgical History:  Past Surgical History  Procedure Date  . Bilateral inguinal hernia repair   . Umbilical hernia repair   . Median sternotomy for mediastinal melanoma 1980s   HPI:  78 yr old admitted with SOB with COPD exacerbation.  Initial CXR  11/25 revealed no active disease with repeat CXR  11/29 revealing  airspace disease worrisome for pna, bilateral pleural effusion.   PMH:  GERD, COPD, acute bronchists, HTN, CABG, allergic rhinitis,  disorders of diaphragm.  MBS recommended to recommend safest and  most efficient po.     Assessment / Plan / Recommendation Clinical Impression  Dysphagia Diagnosis: Mild oral phase dysphagia;Moderate  pharyngeal phase dysphagia Clinical impression: Pt. demonstrated mild motor oral dysphagia  and moderate motor based pharyngeal dysphagia.  Mildly decreased  tongue to palatal contact with mild lingual residuals  intermittently observed.  Pharygneal phase described as reduced  laryngeal elevation and closure resulting in penetration during  and aspiration observed below vocal cords post swallow without  sensation.  Pt. did exhibit a significantly delayed cough that  may have been due to current pulmonary status versus sensation to  barium (?)  Chin tuck technique was attempted, however  ineffective in preventing vestibule entrance of barium.   Vallecular (mod) and pyriform sinus (mild-mod) residue present  with mild-mod clearance following verbal cues to perform second  swallow.  Despite potential for mildly increased of pharyngeal  residue with thicker liquids, recommend nectar thick consistency  as no penetration observed during swallow with nectar nor  penetration/aspiration post swallow from residuals.  Recommend  Dys 3 diet primarily for energy conservation.  Pt. likely with  chronic increased aspiration risk due to COPD which is heightened  when immune system is compromised due to illness.  SLP will  continue  to follow for safety with diet and further pt. and  family education.    Treatment Recommendation  Therapy as outlined in treatment plan below    Diet Recommendation Dysphagia 3 (Mechanical Soft);Nectar-thick  liquid   Liquid Administration via: Cup;No straw Medication Administration: Whole meds with puree Supervision: Patient able to self feed;Full supervision/cueing  for compensatory strategies Compensations: Slow rate;Small sips/bites;Multiple dry swallows  after each bite/sip;Follow solids with liquid;Clear throat  intermittently Postural Changes and/or Swallow Maneuvers: Seated upright 90  degrees;Upright 30-60 min after meal    Other  Recommendations Recommended Consults: MBS Oral Care Recommendations: Oral care BID Other Recommendations: Order thickener from pharmacy   Follow Up Recommendations   (home health if still needed at discharge)    Frequency and Duration min 2x/week  2 weeks       SLP Swallow Goals Patient will consume recommended diet without observed clinical  signs of aspiration with: Minimal cueing Patient will utilize recommended strategies during swallow to  increase swallowing safety with: Minimal cueing      Reason for Referral Objectively evaluate swallowing function   Oral Phase Oral Preparation/Oral Phase Oral Phase: Impaired Oral - Nectar Oral - Nectar Cup: Weak lingual manipulation Oral - Solids Oral - Regular: Weak lingual manipulation   Pharyngeal Phase Pharyngeal Phase Pharyngeal Phase: Impaired Pharyngeal - Thin Pharyngeal - Thin Teaspoon: Reduced tongue base  retraction;Reduced laryngeal elevation;Penetration/Aspiration  during swallow;Pharyngeal residue - valleculae;Pharyngeal residue  - pyriform sinuses Penetration/Aspiration details (thin teaspoon): Material enters  airway, CONTACTS cords and not ejected out Pharyngeal - Thin Cup: Pharyngeal residue - pyriform  sinuses;Pharyngeal residue - valleculae;Reduced laryngeal  elevation;Reduced tongue base  retraction;Penetration/Aspiration  during swallow;Penetration/Aspiration after swallow Penetration/Aspiration details (thin cup): Material enters  airway, passes BELOW cords without attempt by patient to eject  out (silent aspiration) Pharyngeal - Solids Pharyngeal - Puree: Pharyngeal residue - valleculae;Pharyngeal  residue - pyriform sinuses;Reduced laryngeal elevation;Reduced  tongue base retraction Pharyngeal - Regular: Reduced tongue base retraction;Pharyngeal  residue - valleculae  Cervical Esophageal Phase  Cervical Esophageal Phase Cervical Esophageal Phase: [facility]        Darrow [email or web address].Ed CCC-SLP Pager [phone]  07/02/2012         Medications: Scheduled Meds:    . amoxicillin-clavulanate  1 tablet Oral BID  . aspirin  325 mg Oral Daily  . darifenacin  7.5 mg Oral Daily  . dutasteride  0.5 mg Oral Daily  . etodolac  200 mg Oral BID  . famotidine  20 mg Oral QHS  . feeding supplement  1 Container Oral BID  . levalbuterol  0.63 mg Nebulization Q6H  . linezolid  600 mg Oral Q12H  . polyethylene glycol  17 g Oral Daily  . sodium chloride  3 mL Intravenous Q12H  . sodium chloride  3 mL Intravenous Q12H  . Tamsulosin HCl  0.4 mg Oral BID      LOS: 12 days   Von Inscoe M.D. [facility] 07/09/2012, 10:59 AM Pager: [phone]  If 7PM-7AM, please contact night-coverage [email or web address] Password [email or web address]

## 2012-07-09 NOTE — Progress Notes (Signed)
Patient was complaining of his eyes feeling grainy. Patient does appear to have some sort of sediment in his left eye. NP, Benedetto Coons was paged and artificial tears were ordered.

## 2012-07-10 NOTE — Progress Notes (Signed)
Patient ID: Christian [name]  male  ZOX:[id number]    DOB: 1932/02/07    DOA: 06/27/2012  PCP: Michele Mcalpine, MD  Assessment/Plan: Principal Problem:  *Necrotizing pneumonia, community acquired MRSA: breathing improving, still productive phlegm - Continue Zyvox and augmentin, for 4 more weeks  Leukocytosis: trending down, ? to acute illness vs steroids effect vs leukemoid reaction, Solumedrol was dc'ed on 07/04/12. Diarrhea resolved -  C-Diff is negative   COPD (chronic obstructive pulmonary disease) - Cont flutter valve, xopenex nebs, O2   Acute and chronic respiratory failure with hypoxia; as per #1 and #2  Pharyngeal dysphagia: - cont Dysphagia 3 diet with nectar thick liquids.   GERD: cont PPI  Hypertension: stable  Valvular heart disease: known mild aortic stenosis, moderate mitral stenosis and moderate tricuspid regurgitation, per 2D Echocardiogram of 09/22/11 EF 55-60% . He follows up with Dr Willa Rough, cardiologist.  DVT Prophylaxis: SCD's  Code Status: DNR  Disposition: needs SNF, likely on Monday    Subjective: Feeling a whole lot better today, rhonchi better, not coughing much, diarrhea resolved  Objective: Weight change:   Intake/Output Summary (Last 24 hours) at 07/10/12 1140 Last data filed at 07/10/12 0505  Gross per 24 hour  Intake    240 ml  Output    527 ml  Net   -287 ml   Blood pressure 125/69, pulse 94, temperature 97.6 F (36.4 C), temperature source Oral, resp. rate 16, height 6' (1.829 m), weight 82.555 kg (182 lb), SpO2 99.00%.  Physical Exam: General: Alert and awake, oriented x3, NAD. HEENT: anicteric sclera, PERLA, EOMI CVS: S1-S2 clear, no murmur rubs or gallops Chest: diffuse rhonchi b/l improving  Abdomen: soft nontender, nondistended, normal bowel sounds, no organomegaly Extremities: no cyanosis, clubbing or edema noted bilaterally Neuro: non focal  Lab Results: Basic Metabolic Panel:  Lab 07/09/12 8119 07/08/12 0455  NA 140  142  K 3.7 3.9  CL 107 108  CO2 27 29  GLUCOSE 109* 117*  BUN 47* 49*  CREATININE 0.94 0.95  CALCIUM 8.3* 8.4  MG -- --  PHOS -- --   CBC:  Lab 07/09/12 0550 07/08/12 0455  WBC 19.5* 22.6*  NEUTROABS -- --  HGB 14.1 13.8  HCT 42.5 42.0  MCV 88.5 89.2  PLT 122* 132*   CBG:  Lab 07/04/12 1129 07/04/12 0742 07/03/12 2124 07/03/12 1632 07/03/12 1203  GLUCAP 164* 115* 107* 128* 123*     Micro Results: Recent Results (from the past 240 hour(s))  CULTURE, RESPIRATORY     Status: Normal   Collection Time   07/01/12 11:20 PM      Component Value Range Status Comment   Specimen Description SPUTUM   Final    Special Requests NONE   Final    Gram Stain     Final    Value: ABUNDANT WBC PRESENT,BOTH PMN AND MONONUCLEAR     MODERATE SQUAMOUS EPITHELIAL CELLS PRESENT     MODERATE GRAM POSITIVE COCCI IN CLUSTERS     IN PAIRS IN CHAINS RARE GRAM POSITIVE RODS   Culture     Final    Value: MODERATE METHICILLIN RESISTANT STAPHYLOCOCCUS AUREUS     Note: RIFAMPIN AND GENTAMICIN SHOULD NOT BE USED AS SINGLE DRUGS FOR TREATMENT OF STAPH INFECTIONS. This organism DOES NOT demonstrate inducible Clindamycin resistance in vitro. CRITICAL RESULT CALLED TO, READ BACK BY AND VERIFIED WITH: REEVES RN 9AM      07/04/12 GUSTK   Report Status 07/04/2012 FINAL  Final    Organism ID, Bacteria METHICILLIN RESISTANT STAPHYLOCOCCUS AUREUS   Final   MRSA PCR SCREENING     Status: Abnormal   Collection Time   07/02/12 10:58 AM      Component Value Range Status Comment   MRSA by PCR POSITIVE (*) NEGATIVE Final   CLOSTRIDIUM DIFFICILE BY PCR     Status: Normal   Collection Time   07/07/12  1:05 PM      Component Value Range Status Comment   C difficile by pcr NEGATIVE  NEGATIVE Final     Studies/Results: Dg Chest 2 View  07/06/2012  *RADIOLOGY REPORT*  Clinical Data: Lung infiltrates, follow-up, history of COPD and mediastinal melanoma  CHEST - 2 VIEW  Comparison: CT chest of 07/02/2012 and chest  x-ray of 07/01/2012  Findings: The parenchymal opacities in the left lung apex, right lung base, and to a greater degree in the left lung base have not changed significantly.  There do appear to be small effusions present.  Mild cardiomegaly is stable.  Median sternotomy sutures are noted from prior CABG.  No acute bony abnormality is seen.  IMPRESSION: No change in somewhat nodular lung opacities bilaterally particularly in the left lung base with small effusions.   Original Report Authenticated By: Dwyane Dee, M.D.    Dg Chest 2 View  07/01/2012  *RADIOLOGY REPORT*  Clinical Data: Persistent shortness of breath, cough, and congestion.  CHEST - 2 VIEW  Comparison: 06/27/2012 and 04/13/2011  Findings: Stable cardiomegaly.  Changes of median sternotomy with surgical clips in the superior mediastinum.   There is there is chronic elevation of the left hemidiaphragm.  Significant change in aeration of the lungs compared to the chest radiograph of 06/27/2012.  There is multifocal patchy airspace disease, most prominent in the right lung base laterally and in the left upper lung field, with an prominent focal opacity just adjacent to the aortic arch.  Small bilateral pleural effusions are present.  IMPRESSION:  1.  New patchy and somewhat nodular bilateral airspace disease is worrisome for multifocal pneumonia.  There are small bilateral pleural effusions.  Recommend follow-up to clearing. 2.  Chronic elevation of the left hemidiaphragm.  The study is made a call report.   Original Report Authenticated By: Britta Mccreedy, M.D.    Dg Chest 2 View  06/27/2012  *RADIOLOGY REPORT*  Clinical Data: Cough, congestion, shortness of breath  CHEST - 2 VIEW  Comparison: 04/13/2011  Findings: Stable elevation of the left hemidiaphragm with associated left basilar atelectasis / scarring.  Chronic interstitial markings without focal consolidation. No pleural effusion or pneumothorax.  The heart is top normal in size.   Postsurgical changes related to prior CABG.  Degenerative changes of the visualized thoracolumbar spine.  IMPRESSION: Chronic left basilar scarring/atelectasis.  No evidence of acute cardiopulmonary disease.   Original Report Authenticated By: Charline Bills, M.D.    Ct Chest Wo Contrast  07/03/2012  *RADIOLOGY REPORT*  Clinical Data: Abnormal chest x-ray with multifocal airspace disease and hemoptysis.  CT CHEST WITHOUT CONTRAST  Technique:  Multidetector CT imaging of the chest was performed following the standard protocol without IV contrast.  Comparison: Chest x-ray dated 07/01/2012.  Findings: There is extensive bilateral pneumonia.  In the left lung, extensive consolidation is seen in the upper lobe and lower lobe with areas of cavitation and necrosis present.  The left lower lobe bronchus is occluded by material. There are also multiple nodules identified in the lower lobe and upper lobe  adjacent areas of consolidation.  Some of these nodules are partially cavitary.  In the right lung, extensive consolidation is identified in the inferior lower lobe with multiple areas of cavitation and necrosis within densely consolidated lung.  Air bronchograms are also present in consolidated lung. A nodule is present in the posterior right upper lobe and patchy infiltrate is also present in the superior segment of the lower lobe.  A small amount of pleural fluid is present bilaterally, left greater than right.  The heart is enlarged and extensive calcifications are seen involving the aortic valve and mitral valve annulus.  No bony abnormalities.  IMPRESSION: Extensive bilateral cavitary pneumonia with areas of necrosis. Small associated bilateral pleural effusions.  Endobronchial material occludes the left lower lobe bronchus.  If an organism is not isolated by sputum or blood, consider bronchoscopic evaluation.   Original Report Authenticated By: Irish Lack, M.D.    Dg Swallowing Func-speech  Pathology  07/02/2012  Breck Coons Dunlap, CCC-SLP     07/02/2012  3:29 PM Objective Swallowing Evaluation: Modified Barium Swallowing Study   Patient Details  Name: JOEANGEL JEANPAUL MRN: [id number] Date of Birth: [birth year]  Today's Date: 07/02/2012 Time: 4098-1191 SLP Time Calculation (min): 22 min  Past Medical History:  Past Medical History  Diagnosis Date  . Allergic rhinitis   . COPD (chronic obstructive pulmonary disease)   . Disorders of diaphragm   . Acute bronchitis   . Hypertension   . CAD (coronary artery disease)     Catheterization 2009, mild coronary disease (after abnormal  nuclear study,With hypotensive response to exercise, 2009)  . RBBB   . Atrial flutter     Ablated in the past, no recurrence  . Venous insufficiency   . Edema   . Dyslipidemia     Patient has an and to use statin  . GERD (gastroesophageal reflux disease)   . Diverticulosis of colon   . Hypertrophy of prostate with urinary obstruction and other  lower urinary tract symptoms (LUTS)   . Increased prostate specific antigen (PSA) velocity   . DJD (degenerative joint disease)   . Chronic insomnia   . Malignant melanoma     Removed from mediastinum via thoracotomy July, 2010  . Ejection fraction     EF 60%,Echo, 2009,  /  EF 55-60%, echo, February, 2013  . Aortic stenosis     Mild, echo and catheterization, 2009 /  Mild, echo, February,  2013  . Mitral stenosis     Very mild, echo, from mitral annular calcification  . Shortness of breath     Episodes of feeling shortness of breath with some mild  dizziness with mild exertion., February, 2013  . Tricuspid regurgitation     Moderate, echo, February, 2013, 56 mmHg  . Pulmonary hypertension     56 mmHg, echo, February, 2013   Past Surgical History:  Past Surgical History  Procedure Date  . Bilateral inguinal hernia repair   . Umbilical hernia repair   . Median sternotomy for mediastinal melanoma 1980s   HPI:  78 yr old admitted with SOB with COPD exacerbation.  Initial CXR  11/25 revealed no  active disease with repeat CXR 11/29 revealing  airspace disease worrisome for pna, bilateral pleural effusion.   PMH:  GERD, COPD, acute bronchists, HTN, CABG, allergic rhinitis,  disorders of diaphragm.  MBS recommended to recommend safest and  most efficient po.     Assessment / Plan / Recommendation Clinical Impression  Dysphagia Diagnosis: Mild oral  phase dysphagia;Moderate  pharyngeal phase dysphagia Clinical impression: Pt. demonstrated mild motor oral dysphagia  and moderate motor based pharyngeal dysphagia.  Mildly decreased  tongue to palatal contact with mild lingual residuals  intermittently observed.  Pharygneal phase described as reduced  laryngeal elevation and closure resulting in penetration during  and aspiration observed below vocal cords post swallow without  sensation.  Pt. did exhibit a significantly delayed cough that  may have been due to current pulmonary status versus sensation to  barium (?)  Chin tuck technique was attempted, however  ineffective in preventing vestibule entrance of barium.   Vallecular (mod) and pyriform sinus (mild-mod) residue present  with mild-mod clearance following verbal cues to perform second  swallow.  Despite potential for mildly increased of pharyngeal  residue with thicker liquids, recommend nectar thick consistency  as no penetration observed during swallow with nectar nor  penetration/aspiration post swallow from residuals.  Recommend  Dys 3 diet primarily for energy conservation.  Pt. likely with  chronic increased aspiration risk due to COPD which is heightened  when immune system is compromised due to illness.  SLP will  continue to follow for safety with diet and further pt. and  family education.    Treatment Recommendation  Therapy as outlined in treatment plan below    Diet Recommendation Dysphagia 3 (Mechanical Soft);Nectar-thick  liquid   Liquid Administration via: Cup;No straw Medication Administration: Whole meds with puree Supervision: Patient  able to self feed;Full supervision/cueing  for compensatory strategies Compensations: Slow rate;Small sips/bites;Multiple dry swallows  after each bite/sip;Follow solids with liquid;Clear throat  intermittently Postural Changes and/or Swallow Maneuvers: Seated upright 90  degrees;Upright 30-60 min after meal    Other  Recommendations Recommended Consults: MBS Oral Care Recommendations: Oral care BID Other Recommendations: Order thickener from pharmacy   Follow Up Recommendations   (home health if still needed at discharge)    Frequency and Duration min 2x/week  2 weeks       SLP Swallow Goals Patient will consume recommended diet without observed clinical  signs of aspiration with: Minimal cueing Patient will utilize recommended strategies during swallow to  increase swallowing safety with: Minimal cueing      Reason for Referral Objectively evaluate swallowing function   Oral Phase Oral Preparation/Oral Phase Oral Phase: Impaired Oral - Nectar Oral - Nectar Cup: Weak lingual manipulation Oral - Solids Oral - Regular: Weak lingual manipulation   Pharyngeal Phase Pharyngeal Phase Pharyngeal Phase: Impaired Pharyngeal - Thin Pharyngeal - Thin Teaspoon: Reduced tongue base  retraction;Reduced laryngeal elevation;Penetration/Aspiration  during swallow;Pharyngeal residue - valleculae;Pharyngeal residue  - pyriform sinuses Penetration/Aspiration details (thin teaspoon): Material enters  airway, CONTACTS cords and not ejected out Pharyngeal - Thin Cup: Pharyngeal residue - pyriform  sinuses;Pharyngeal residue - valleculae;Reduced laryngeal  elevation;Reduced tongue base retraction;Penetration/Aspiration  during swallow;Penetration/Aspiration after swallow Penetration/Aspiration details (thin cup): Material enters  airway, passes BELOW cords without attempt by patient to eject  out (silent aspiration) Pharyngeal - Solids Pharyngeal - Puree: Pharyngeal residue - valleculae;Pharyngeal  residue - pyriform sinuses;Reduced  laryngeal elevation;Reduced  tongue base retraction Pharyngeal - Regular: Reduced tongue base retraction;Pharyngeal  residue - valleculae  Cervical Esophageal Phase    Cervical Esophageal Phase Cervical Esophageal Phase: [facility]        Darrow [email or web address] CCC-SLP Pager [phone]  07/02/2012         Medications: Scheduled Meds:    . amoxicillin-clavulanate  1 tablet Oral BID  . aspirin  325 mg Oral Daily  .  darifenacin  7.5 mg Oral Daily  . dutasteride  0.5 mg Oral Daily  . etodolac  200 mg Oral BID  . famotidine  20 mg Oral QHS  . feeding supplement  1 Container Oral BID  . levalbuterol  0.63 mg Nebulization Q6H  . linezolid  600 mg Oral Q12H  . magic mouthwash  5 mL Oral QID  . polyethylene glycol  17 g Oral Daily  . sodium chloride  3 mL Intravenous Q12H  . sodium chloride  3 mL Intravenous Q12H  . Tamsulosin HCl  0.4 mg Oral BID      LOS: 13 days   RAI,RIPUDEEP M.D. [facility] 07/10/2012, 11:40 AM Pager: [phone]  If 7PM-7AM, please contact night-coverage [email or web address] Password [email or web address]

## 2012-07-11 DIAGNOSIS — B370 Candidal stomatitis: Secondary | ICD-10-CM | POA: Diagnosis present

## 2012-07-11 LAB — CBC
HCT: 40.8 % (ref 39.0–52.0)
Hemoglobin: 13.5 g/dL (ref 13.0–17.0)
MCH: 29.3 pg (ref 26.0–34.0)
MCHC: 33.1 g/dL (ref 30.0–36.0)
MCV: 88.5 fL (ref 78.0–100.0)
Platelets: 140 10*3/uL — ABNORMAL LOW (ref 150–400)
RBC: 4.61 MIL/uL (ref 4.22–5.81)
RDW: 13.5 % (ref 11.5–15.5)
WBC: 19.6 10*3/uL — ABNORMAL HIGH (ref 4.0–10.5)

## 2012-07-11 LAB — BASIC METABOLIC PANEL
BUN: 49 mg/dL — ABNORMAL HIGH (ref 6–23)
CO2: 29 mEq/L (ref 19–32)
Calcium: 8.3 mg/dL — ABNORMAL LOW (ref 8.4–10.5)
Chloride: 104 mEq/L (ref 96–112)
Creatinine, Ser: 1.17 mg/dL (ref 0.50–1.35)
GFR calc Af Amer: 67 mL/min — ABNORMAL LOW (ref >90)
GFR calc non Af Amer: 58 mL/min — ABNORMAL LOW (ref >90)
Glucose, Bld: 111 mg/dL — ABNORMAL HIGH (ref 70–99)
Potassium: 3.8 mEq/L (ref 3.5–5.1)
Sodium: 138 mEq/L (ref 135–145)

## 2012-07-11 MED ORDER — IPRATROPIUM BROMIDE 0.02 % IN SOLN
0.5000 mg | Freq: Three times a day (TID) | RESPIRATORY_TRACT | Status: DC
  Administered 2012-07-11 – 2012-07-12 (×3): 0.5 mg via RESPIRATORY_TRACT
  Filled 2012-07-11 (×4): qty 2.5

## 2012-07-11 MED ORDER — FLUCONAZOLE 100 MG PO TABS
100.0000 mg | ORAL_TABLET | Freq: Every day | ORAL | Status: DC
  Administered 2012-07-11 – 2012-07-12 (×2): 100 mg via ORAL
  Filled 2012-07-11 (×2): qty 1

## 2012-07-11 MED ORDER — LEVALBUTEROL HCL 0.63 MG/3ML IN NEBU
0.6300 mg | INHALATION_SOLUTION | Freq: Three times a day (TID) | RESPIRATORY_TRACT | Status: DC
  Administered 2012-07-11 – 2012-07-12 (×3): 0.63 mg via RESPIRATORY_TRACT
  Filled 2012-07-11 (×7): qty 3

## 2012-07-11 MED ORDER — LEVALBUTEROL HCL 0.63 MG/3ML IN NEBU
0.6300 mg | INHALATION_SOLUTION | Freq: Four times a day (QID) | RESPIRATORY_TRACT | Status: DC | PRN
  Filled 2012-07-11: qty 3

## 2012-07-11 MED ORDER — ALUM & MAG HYDROXIDE-SIMETH 200-200-20 MG/5ML PO SUSP
30.0000 mL | Freq: Four times a day (QID) | ORAL | Status: DC | PRN
  Administered 2012-07-11: 30 mL via ORAL
  Filled 2012-07-11: qty 30

## 2012-07-11 MED ORDER — FLUCONAZOLE 40 MG/ML PO SUSR
100.0000 mg | Freq: Every day | ORAL | Status: DC
  Filled 2012-07-11: qty 2.5

## 2012-07-11 MED ORDER — ENSURE COMPLETE PO LIQD
237.0000 mL | Freq: Two times a day (BID) | ORAL | Status: DC
  Administered 2012-07-11: 237 mL via ORAL

## 2012-07-11 MED ORDER — PANTOPRAZOLE SODIUM 40 MG PO TBEC
40.0000 mg | DELAYED_RELEASE_TABLET | Freq: Two times a day (BID) | ORAL | Status: DC
  Administered 2012-07-11 – 2012-07-12 (×2): 40 mg via ORAL
  Filled 2012-07-11 (×5): qty 1

## 2012-07-11 NOTE — Progress Notes (Signed)
Pt has been asking & drinking for reg thin consistency Coke & refusing the thickener. Pt understand the risk of aspiration. Spoke with Tammy, SLP & pt has been known non-compliant. Will cont to monitor.

## 2012-07-11 NOTE — Progress Notes (Signed)
Patient ID: Christian [name]  male  GEX:[id number]    DOB: [birth year]    DOA: 06/27/2012  PCP: Michele Mcalpine, MD  Assessment/Plan: Principal Problem:  *Necrotizing pneumonia, community acquired MRSA: breathing improving, still productive phlegm - Continue Zyvox and augmentin, for 4 more weeks  Leukocytosis: trending down, ? to acute illness vs steroids effect vs leukemoid reaction, Solumedrol was dc'ed on 07/04/12. Diarrhea resolved -  C-Diff is negative   COPD (chronic obstructive pulmonary disease) - Cont flutter valve, xopenex nebs, O2   Acute and chronic respiratory failure with hypoxia; as per #1 and #2  Oral thrush - Placed on Magic mouthwash and oral fluconazole - Placed on Ensure per patient's preference  Pharyngeal dysphagia: - cont Dysphagia 3 diet with nectar thick liquids.   GERD: cont PPI, increased to BID  Hypertension: stable  Valvular heart disease: known mild aortic stenosis, moderate mitral stenosis and moderate tricuspid regurgitation, per 2D Echocardiogram of 09/22/11 EF 55-60% . He follows up with Dr Willa Rough, cardiologist.  DVT Prophylaxis: SCD's  Code Status: DNR  Disposition: needs SNF, likely on Monday    Subjective: Patient's daughter upset that he is not eating well, appears lethargic today, coughing a lot. Explained to the patient's daughter that he's coughing a lot and will have productive phlegm until the necrotizing pneumonia is completely resolved which will take at least 3-4 weeks for antibiotics.  Objective: Weight change:   Intake/Output Summary (Last 24 hours) at 07/11/12 1342 Last data filed at 07/11/12 0530  Gross per 24 hour  Intake    360 ml  Output    525 ml  Net   -165 ml   Blood pressure 119/84, pulse 94, temperature 97.6 F (36.4 C), temperature source Oral, resp. rate 20, height 6' (1.829 m), weight 82.555 kg (182 lb), SpO2 96.00%.  Physical Exam: General: alert, oriented x3, NAD. HEENT: anicteric sclera, PERLA,  EOMI CVS: S1-S2 clear, no murmur rubs or gallops Chest: diffuse rhonchi b/l improving  Abdomen: soft nontender, nondistended, normal bowel sounds, no organomegaly Extremities: no cyanosis, clubbing or edema noted bilaterally Neuro: non focal  Lab Results: Basic Metabolic Panel:  Lab 07/11/12 0102 07/09/12 0550  NA 138 140  K 3.8 3.7  CL 104 107  CO2 29 27  GLUCOSE 111* 109*  BUN 49* 47*  CREATININE 1.17 0.94  CALCIUM 8.3* 8.3*  MG -- --  PHOS -- --   CBC:  Lab 07/11/12 0505 07/09/12 0550  WBC 19.6* 19.5*  NEUTROABS -- --  HGB 13.5 14.1  HCT 40.8 42.5  MCV 88.5 88.5  PLT 140* 122*   CBG: No results found for this basename: GLUCAP:5 in the last 168 hours   Micro Results: Recent Results (from the past 240 hour(s))  CULTURE, RESPIRATORY     Status: Normal   Collection Time   07/01/12 11:20 PM      Component Value Range Status Comment   Specimen Description SPUTUM   Final    Special Requests NONE   Final    Gram Stain     Final    Value: ABUNDANT WBC PRESENT,BOTH PMN AND MONONUCLEAR     MODERATE SQUAMOUS EPITHELIAL CELLS PRESENT     MODERATE GRAM POSITIVE COCCI IN CLUSTERS     IN PAIRS IN CHAINS RARE GRAM POSITIVE RODS   Culture     Final    Value: MODERATE METHICILLIN RESISTANT STAPHYLOCOCCUS AUREUS     Note: RIFAMPIN AND GENTAMICIN SHOULD NOT BE USED AS SINGLE DRUGS  FOR TREATMENT OF STAPH INFECTIONS. This organism DOES NOT demonstrate inducible Clindamycin resistance in vitro. CRITICAL RESULT CALLED TO, READ BACK BY AND VERIFIED WITH: REEVES RN 9AM      07/04/12 GUSTK   Report Status 07/04/2012 FINAL   Final    Organism ID, Bacteria METHICILLIN RESISTANT STAPHYLOCOCCUS AUREUS   Final   MRSA PCR SCREENING     Status: Abnormal   Collection Time   07/02/12 10:58 AM      Component Value Range Status Comment   MRSA by PCR POSITIVE (*) NEGATIVE Final   CLOSTRIDIUM DIFFICILE BY PCR     Status: Normal   Collection Time   07/07/12  1:05 PM      Component Value Range  Status Comment   C difficile by pcr NEGATIVE  NEGATIVE Final     Studies/Results: Dg Chest 2 View  07/06/2012  *RADIOLOGY REPORT*  Clinical Data: Lung infiltrates, follow-up, history of COPD and mediastinal melanoma  CHEST - 2 VIEW  Comparison: CT chest of 07/02/2012 and chest x-ray of 07/01/2012  Findings: The parenchymal opacities in the left lung apex, right lung base, and to a greater degree in the left lung base have not changed significantly.  There do appear to be small effusions present.  Mild cardiomegaly is stable.  Median sternotomy sutures are noted from prior CABG.  No acute bony abnormality is seen.  IMPRESSION: No change in somewhat nodular lung opacities bilaterally particularly in the left lung base with small effusions.   Original Report Authenticated By: Dwyane Dee, M.D.    Dg Chest 2 View  07/01/2012  *RADIOLOGY REPORT*  Clinical Data: Persistent shortness of breath, cough, and congestion.  CHEST - 2 VIEW  Comparison: 06/27/2012 and 04/13/2011  Findings: Stable cardiomegaly.  Changes of median sternotomy with surgical clips in the superior mediastinum.   There is there is chronic elevation of the left hemidiaphragm.  Significant change in aeration of the lungs compared to the chest radiograph of 06/27/2012.  There is multifocal patchy airspace disease, most prominent in the right lung base laterally and in the left upper lung field, with an prominent focal opacity just adjacent to the aortic arch.  Small bilateral pleural effusions are present.  IMPRESSION:  1.  New patchy and somewhat nodular bilateral airspace disease is worrisome for multifocal pneumonia.  There are small bilateral pleural effusions.  Recommend follow-up to clearing. 2.  Chronic elevation of the left hemidiaphragm.  The study is made a call report.   Original Report Authenticated By: Britta Mccreedy, M.D.    Dg Chest 2 View  06/27/2012  *RADIOLOGY REPORT*  Clinical Data: Cough, congestion, shortness of breath  CHEST  - 2 VIEW  Comparison: 04/13/2011  Findings: Stable elevation of the left hemidiaphragm with associated left basilar atelectasis / scarring.  Chronic interstitial markings without focal consolidation. No pleural effusion or pneumothorax.  The heart is top normal in size.  Postsurgical changes related to prior CABG.  Degenerative changes of the visualized thoracolumbar spine.  IMPRESSION: Chronic left basilar scarring/atelectasis.  No evidence of acute cardiopulmonary disease.   Original Report Authenticated By: Charline Bills, M.D.    Ct Chest Wo Contrast  07/03/2012  *RADIOLOGY REPORT*  Clinical Data: Abnormal chest x-ray with multifocal airspace disease and hemoptysis.  CT CHEST WITHOUT CONTRAST  Technique:  Multidetector CT imaging of the chest was performed following the standard protocol without IV contrast.  Comparison: Chest x-ray dated 07/01/2012.  Findings: There is extensive bilateral pneumonia.  In the  left lung, extensive consolidation is seen in the upper lobe and lower lobe with areas of cavitation and necrosis present.  The left lower lobe bronchus is occluded by material. There are also multiple nodules identified in the lower lobe and upper lobe adjacent areas of consolidation.  Some of these nodules are partially cavitary.  In the right lung, extensive consolidation is identified in the inferior lower lobe with multiple areas of cavitation and necrosis within densely consolidated lung.  Air bronchograms are also present in consolidated lung. A nodule is present in the posterior right upper lobe and patchy infiltrate is also present in the superior segment of the lower lobe.  A small amount of pleural fluid is present bilaterally, left greater than right.  The heart is enlarged and extensive calcifications are seen involving the aortic valve and mitral valve annulus.  No bony abnormalities.  IMPRESSION: Extensive bilateral cavitary pneumonia with areas of necrosis. Small associated bilateral  pleural effusions.  Endobronchial material occludes the left lower lobe bronchus.  If an organism is not isolated by sputum or blood, consider bronchoscopic evaluation.   Original Report Authenticated By: Irish Lack, M.D.    Dg Swallowing Func-speech Pathology  07/02/2012  Breck Coons [place], CCC-SLP     07/02/2012  3:29 PM Objective Swallowing Evaluation: Modified Barium Swallowing Study   Patient Details  Name: LINUS WECKERLY MRN: [id number] Date of Birth: [birth year]  Today's Date: 07/02/2012 Time: 1191-4782 SLP Time Calculation (min): 22 min  Past Medical History:  Past Medical History  Diagnosis Date  . Allergic rhinitis   . COPD (chronic obstructive pulmonary disease)   . Disorders of diaphragm   . Acute bronchitis   . Hypertension   . CAD (coronary artery disease)     Catheterization 2009, mild coronary disease (after abnormal  nuclear study,With hypotensive response to exercise, 2009)  . RBBB   . Atrial flutter     Ablated in the past, no recurrence  . Venous insufficiency   . Edema   . Dyslipidemia     Patient has an and to use statin  . GERD (gastroesophageal reflux disease)   . Diverticulosis of colon   . Hypertrophy of prostate with urinary obstruction and other  lower urinary tract symptoms (LUTS)   . Increased prostate specific antigen (PSA) velocity   . DJD (degenerative joint disease)   . Chronic insomnia   . Malignant melanoma     Removed from mediastinum via thoracotomy July, 2010  . Ejection fraction     EF 60%,Echo, 2009,  /  EF 55-60%, echo, February, 2013  . Aortic stenosis     Mild, echo and catheterization, 2009 /  Mild, echo, February,  2013  . Mitral stenosis     Very mild, echo, from mitral annular calcification  . Shortness of breath     Episodes of feeling shortness of breath with some mild  dizziness with mild exertion., February, 2013  . Tricuspid regurgitation     Moderate, echo, February, 2013, 56 mmHg  . Pulmonary hypertension     56 mmHg, echo, February, 2013   Past Surgical  History:  Past Surgical History  Procedure Date  . Bilateral inguinal hernia repair   . Umbilical hernia repair   . Median sternotomy for mediastinal melanoma 1980s   HPI:  78 yr old admitted with SOB with COPD exacerbation.  Initial CXR  11/25 revealed no active disease with repeat CXR 11/29 revealing  airspace disease worrisome for pna, bilateral pleural effusion.  PMH:  GERD, COPD, acute bronchists, HTN, CABG, allergic rhinitis,  disorders of diaphragm.  MBS recommended to recommend safest and  most efficient po.     Assessment / Plan / Recommendation Clinical Impression  Dysphagia Diagnosis: Mild oral phase dysphagia;Moderate  pharyngeal phase dysphagia Clinical impression: Pt. demonstrated mild motor oral dysphagia  and moderate motor based pharyngeal dysphagia.  Mildly decreased  tongue to palatal contact with mild lingual residuals  intermittently observed.  Pharygneal phase described as reduced  laryngeal elevation and closure resulting in penetration during  and aspiration observed below vocal cords post swallow without  sensation.  Pt. did exhibit a significantly delayed cough that  may have been due to current pulmonary status versus sensation to  barium (?)  Chin tuck technique was attempted, however  ineffective in preventing vestibule entrance of barium.   Vallecular (mod) and pyriform sinus (mild-mod) residue present  with mild-mod clearance following verbal cues to perform second  swallow.  Despite potential for mildly increased of pharyngeal  residue with thicker liquids, recommend nectar thick consistency  as no penetration observed during swallow with nectar nor  penetration/aspiration post swallow from residuals.  Recommend  Dys 3 diet primarily for energy conservation.  Pt. likely with  chronic increased aspiration risk due to COPD which is heightened  when immune system is compromised due to illness.  SLP will  continue to follow for safety with diet and further pt. and  family education.     Treatment Recommendation  Therapy as outlined in treatment plan below    Diet Recommendation Dysphagia 3 (Mechanical Soft);Nectar-thick  liquid   Liquid Administration via: Cup;No straw Medication Administration: Whole meds with puree Supervision: Patient able to self feed;Full supervision/cueing  for compensatory strategies Compensations: Slow rate;Small sips/bites;Multiple dry swallows  after each bite/sip;Follow solids with liquid;Clear throat  intermittently Postural Changes and/or Swallow Maneuvers: Seated upright 90  degrees;Upright 30-60 min after meal    Other  Recommendations Recommended Consults: MBS Oral Care Recommendations: Oral care BID Other Recommendations: Order thickener from pharmacy   Follow Up Recommendations   (home health if still needed at discharge)    Frequency and Duration min 2x/week  2 weeks       SLP Swallow Goals Patient will consume recommended diet without observed clinical  signs of aspiration with: Minimal cueing Patient will utilize recommended strategies during swallow to  increase swallowing safety with: Minimal cueing      Reason for Referral Objectively evaluate swallowing function   Oral Phase Oral Preparation/Oral Phase Oral Phase: Impaired Oral - Nectar Oral - Nectar Cup: Weak lingual manipulation Oral - Solids Oral - Regular: Weak lingual manipulation   Pharyngeal Phase Pharyngeal Phase Pharyngeal Phase: Impaired Pharyngeal - Thin Pharyngeal - Thin Teaspoon: Reduced tongue base  retraction;Reduced laryngeal elevation;Penetration/Aspiration  during swallow;Pharyngeal residue - valleculae;Pharyngeal residue  - pyriform sinuses Penetration/Aspiration details (thin teaspoon): Material enters  airway, CONTACTS cords and not ejected out Pharyngeal - Thin Cup: Pharyngeal residue - pyriform  sinuses;Pharyngeal residue - valleculae;Reduced laryngeal  elevation;Reduced tongue base retraction;Penetration/Aspiration  during swallow;Penetration/Aspiration after swallow  Penetration/Aspiration details (thin cup): Material enters  airway, passes BELOW cords without attempt by patient to eject  out (silent aspiration) Pharyngeal - Solids Pharyngeal - Puree: Pharyngeal residue - valleculae;Pharyngeal  residue - pyriform sinuses;Reduced laryngeal elevation;Reduced  tongue base retraction Pharyngeal - Regular: Reduced tongue base retraction;Pharyngeal  residue - valleculae  Cervical Esophageal Phase    Cervical Esophageal Phase Cervical Esophageal Phase: [facility]  Breck Coons [place].Ed CCC-SLP Pager [phone]  07/02/2012         Medications: Scheduled Meds:    . amoxicillin-clavulanate  1 tablet Oral BID  . aspirin  325 mg Oral Daily  . darifenacin  7.5 mg Oral Daily  . dutasteride  0.5 mg Oral Daily  . etodolac  200 mg Oral BID  . famotidine  20 mg Oral QHS  . feeding supplement  1 Container Oral BID  . ipratropium  0.5 mg Nebulization TID  . levalbuterol  0.63 mg Nebulization TID  . linezolid  600 mg Oral Q12H  . magic mouthwash  5 mL Oral QID  . polyethylene glycol  17 g Oral Daily  . sodium chloride  3 mL Intravenous Q12H  . sodium chloride  3 mL Intravenous Q12H  . Tamsulosin HCl  0.4 mg Oral BID  . [DISCONTINUED] levalbuterol  0.63 mg Nebulization Q6H      LOS: 14 days   Linkin Vizzini M.D. [facility] 07/11/2012, 1:42 PM Pager: [phone]  If 7PM-7AM, please contact night-coverage [email or web address] Password [email or web address]

## 2012-07-12 DIAGNOSIS — R0602 Shortness of breath: Secondary | ICD-10-CM

## 2012-07-12 DIAGNOSIS — B370 Candidal stomatitis: Secondary | ICD-10-CM

## 2012-07-12 LAB — CBC
Hemoglobin: 12.6 g/dL — ABNORMAL LOW (ref 13.0–17.0)
MCHC: 32.4 g/dL (ref 30.0–36.0)
RBC: 4.36 MIL/uL (ref 4.22–5.81)

## 2012-07-12 LAB — BASIC METABOLIC PANEL
CO2: 29 mEq/L (ref 19–32)
GFR calc non Af Amer: 47 mL/min — ABNORMAL LOW (ref >90)
Glucose, Bld: 111 mg/dL — ABNORMAL HIGH (ref 70–99)
Potassium: 3.5 mEq/L (ref 3.5–5.1)
Sodium: 134 mEq/L — ABNORMAL LOW (ref 135–145)

## 2012-07-12 MED ORDER — ALUM & MAG HYDROXIDE-SIMETH 200-200-20 MG/5ML PO SUSP: 30.0000 mL | Freq: Four times a day (QID) | ORAL | Status: DC | PRN

## 2012-07-12 MED ORDER — FLUCONAZOLE 100 MG PO TABS: 100.0000 mg | ORAL_TABLET | Freq: Every day | ORAL | Status: DC

## 2012-07-12 MED ORDER — MAGIC MOUTHWASH: 5.0000 mL | Freq: Four times a day (QID) | ORAL | Status: DC

## 2012-07-12 MED ORDER — IPRATROPIUM BROMIDE 0.02 % IN SOLN: 0.5000 mg | Freq: Three times a day (TID) | RESPIRATORY_TRACT | Status: DC

## 2012-07-12 MED ORDER — PANTOPRAZOLE SODIUM 40 MG PO TBEC: 40.0000 mg | DELAYED_RELEASE_TABLET | Freq: Two times a day (BID) | ORAL | Status: AC

## 2012-07-12 MED ORDER — LINEZOLID 600 MG PO TABS: 600.0000 mg | ORAL_TABLET | Freq: Two times a day (BID) | ORAL | Status: DC

## 2012-07-12 MED ORDER — BOOST / RESOURCE BREEZE PO LIQD: 1.0000 | Freq: Two times a day (BID) | ORAL | Status: DC

## 2012-07-12 MED ORDER — GUAIFENESIN-DM 100-10 MG/5ML PO SYRP: 5.0000 mL | ORAL_SOLUTION | ORAL | Status: DC | PRN

## 2012-07-12 MED ORDER — STARCH (THICKENING) PO POWD: ORAL | Status: DC

## 2012-07-12 MED ORDER — POLYETHYLENE GLYCOL 3350 17 G PO PACK: 17.0000 g | PACK | Freq: Every day | ORAL | Status: AC | PRN

## 2012-07-12 MED ORDER — AMOXICILLIN-POT CLAVULANATE 875-125 MG PO TABS: 1.0000 | ORAL_TABLET | Freq: Two times a day (BID) | ORAL | Status: DC

## 2012-07-12 MED ORDER — LEVALBUTEROL HCL 0.63 MG/3ML IN NEBU: 0.6300 mg | INHALATION_SOLUTION | Freq: Three times a day (TID) | RESPIRATORY_TRACT | Status: AC

## 2012-07-12 MED ORDER — OXYCODONE HCL 5 MG PO TABS: 5.0000 mg | ORAL_TABLET | ORAL | Status: DC | PRN

## 2012-07-12 MED ORDER — LEVALBUTEROL HCL 0.63 MG/3ML IN NEBU: 0.6300 mg | INHALATION_SOLUTION | RESPIRATORY_TRACT | Status: DC | PRN

## 2012-07-12 MED ORDER — POLYVINYL ALCOHOL 1.4 % OP SOLN: 2.0000 [drp] | OPHTHALMIC | Status: DC | PRN

## 2012-07-12 MED ORDER — TROSPIUM CHLORIDE 20 MG PO TABS: 20.0000 mg | ORAL_TABLET | Freq: Two times a day (BID) | ORAL | Status: DC

## 2012-07-12 MED ORDER — ENSURE COMPLETE PO LIQD: 237.0000 mL | Freq: Two times a day (BID) | ORAL | Status: DC

## 2012-07-12 NOTE — Progress Notes (Signed)
Physical Therapy Treatment Patient Details Name: Christian [name] MRN: [id number] DOB: 1931/09/26 Today's Date: 07/12/2012 Time: 4696-2952 PT Time Calculation (min): 31 min  PT Assessment / Plan / Recommendation Comments on Treatment Session  rogressing well and plans to D/C to [facility] today for ST Rehab.    Follow Up Recommendations  SNF     Does the patient have the potential to tolerate intense rehabilitation     Barriers to Discharge        Equipment Recommendations  None recommended by PT    Recommendations for Other Services    Frequency Min 3X/week   Plan Discharge plan remains appropriate    Precautions / Restrictions Precautions Precautions: Fall Precaution Comments: 3 lts O2 Restrictions Weight Bearing Restrictions: No   Pertinent Vitals/Pain No c/o pain    Mobility  Bed Mobility Bed Mobility: Not assessed Details for Bed Mobility Assistance: Pt OOB in recliner Transfers Transfers: Sit to Stand;Stand to Sit Sit to Stand: 5: Supervision;4: Min guard;From chair/3-in-1 Stand to Sit: 5: Supervision;4: Min guard Details for Transfer Assistance: increased time and good use of hands Ambulation/Gait Ambulation/Gait Assistance: 5: Supervision;4: Min guard Ambulation Distance (Feet): 175 Feet Assistive device: Rolling walker Ambulation/Gait Assistance Details: <25% VC's on safety with turns and backward gait Gait Pattern: Step-through pattern Gait velocity: increased General Gait Details: 25% VC's safety with turns using RW and safety/caution of O2 tubing     Exercises     PT Diagnosis:    PT Problem List:   PT Treatment Interventions:     PT Goals    Visit Information  Last PT Received On: 07/12/12 Assistance Needed: +1    Subjective Data  Subjective: I'm going to [facility] today Patient Stated Goal: Rehab   Cognition       Balance     End of Session PT - End of Session Equipment Utilized During Treatment: Gait belt Activity Tolerance:  Patient tolerated treatment well Patient left: in chair;with call bell/phone within reach;with family/visitor present   Felecia Shelling  PTA [facility]  Acute  Rehab Pager     [phone]

## 2012-07-12 NOTE — Progress Notes (Signed)
Pt was encouraged to get OOB to chair this am to eat breakfast but pt not yet ready to eat & get up. Will check again later.

## 2012-07-12 NOTE — Discharge Summary (Signed)
Physician Discharge Summary  Patient ID: Christian [name] MRN: [id number] DOB/AGE: [birth year] 78 y.o.  Admit date: 06/27/2012 Discharge date: 07/12/2012  Primary Care Physician:  Michele Mcalpine, MD  Discharge Diagnoses:    . Necrotizing pneumonia, community acquired MRSA . COPD (chronic obstructive pulmonary disease) . Acute and chronic respiratory failure with hypoxia . ESSENTIAL HYPERTENSION . GERD . Dysphagia, pharyngeal . Thrush, oral   Generalised debility  Consults:  Pulmonology- Dr Sung Amabile                    ID- Dr Orvan Falconer via phone consult   Discharge Medications:   Medication List     As of 07/12/2012 11:41 AM    STOP taking these medications         ADVAIR DISKUS 500-50 MCG/DOSE Aepb   Generic drug: Fluticasone-Salmeterol      albuterol 108 (90 BASE) MCG/ACT inhaler   Commonly known as: PROVENTIL HFA;VENTOLIN HFA      atenolol 25 MG tablet   Commonly known as: TENORMIN      Cranberry Concentrate 500 MG Caps   Generic drug: Cranberry      dextromethorphan-guaiFENesin 30-600 MG per 12 hr tablet   Commonly known as: MUCINEX DM      glucosamine-chondroitin 500-400 MG tablet      NEXIUM 40 MG capsule   Generic drug: esomeprazole      nystatin 100000 UNIT/ML suspension   Commonly known as: MYCOSTATIN      ranitidine 150 MG capsule   Commonly known as: ZANTAC      TAKE these medications         alum & mag hydroxide-simeth 200-200-20 MG/5ML suspension   Commonly known as: MAALOX/MYLANTA   Take 30 mLs by mouth every 6 (six) hours as needed.      amoxicillin-clavulanate 875-125 MG per tablet   Commonly known as: AUGMENTIN   Take 1 tablet by mouth 2 (two) times daily. X 4 weeks, stop date Jan 6th.      aspirin 325 MG EC tablet   Take 325 mg by mouth daily.      AVODART 0.5 MG capsule   Generic drug: dutasteride   TAKE 1 CAPSULE EVERY DAY      etodolac 400 MG 24 hr tablet   Commonly known as: LODINE XL   TAKE 1 TABLET (400 MG TOTAL) BY MOUTH  DAILY.      feeding supplement Liqd   Take 237 mLs by mouth 2 (two) times daily between meals. VANILLA flavor      feeding supplement Liqd   Take 1 Container by mouth 2 (two) times daily.      fluconazole 100 MG tablet   Commonly known as: DIFLUCAN   Take 1 tablet (100 mg total) by mouth daily. X 10 days for oral thrush      food thickener Powd   Commonly known as: THICK IT   As needed with meals, NECTAR THICK      guaiFENesin-dextromethorphan 100-10 MG/5ML syrup   Commonly known as: ROBITUSSIN DM   Take 5 mLs by mouth every 4 (four) hours as needed for cough.      ipratropium 0.02 % nebulizer solution   Commonly known as: ATROVENT   Take 2.5 mLs (0.5 mg total) by nebulization 3 (three) times daily.      levalbuterol 0.63 MG/3ML nebulizer solution   Commonly known as: XOPENEX   Take 3 mLs (0.63 mg total) by nebulization 3 (three) times daily.  levalbuterol 0.63 MG/3ML nebulizer solution   Commonly known as: XOPENEX   Take 3 mLs (0.63 mg total) by nebulization every 2 (two) hours as needed for wheezing or shortness of breath.      linezolid 600 MG tablet   Commonly known as: ZYVOX   Take 1 tablet (600 mg total) by mouth every 12 (twelve) hours. For 4 weeks, stop date Jan 6th, 2014      magic mouthwash Soln   Take 5 mLs by mouth 4 (four) times daily.      oxyCODONE 5 MG immediate release tablet   Commonly known as: Oxy IR/ROXICODONE   Take 1 tablet (5 mg total) by mouth every 4 (four) hours as needed for pain (May use as cough suppressant at HS PRN).      pantoprazole 40 MG tablet   Commonly known as: PROTONIX   Take 1 tablet (40 mg total) by mouth 2 (two) times daily before a meal.      polyethylene glycol packet   Commonly known as: MIRALAX / GLYCOLAX   Take 17 g by mouth daily as needed (constipation).      polyvinyl alcohol 1.4 % ophthalmic solution   Commonly known as: LIQUIFILM TEARS   Place 2 drops into both eyes as needed.      Tamsulosin HCl 0.4 MG  Caps   Commonly known as: FLOMAX   Take 0.4 mg by mouth 2 (two) times daily.      trospium 20 MG tablet   Commonly known as: SANCTURA   Take 1 tablet (20 mg total) by mouth 2 (two) times daily.         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 According to patient, his symptoms started on 06/22/12, with cough, productive of greenish-brownish phlegm, increasing shortness of breath and wheeze, as well as central chest pain on coughing. He denied sick contacts, and utilized his bronchodilators to no avail. In the night of 06/26/12, the phlegm became blood-streaked, and in AM of 06/27/12, he had chills, but no fever. He drove himself to the ED, where he was administered 2 nebulizer treatments, and referred for admission   Hospital Course:   Necrotizing pneumonia, community acquired MRSA, acute on chronic respiratory failure with hypoxia: breathing improving, still productive phlegm. Patient was admitted to the hospital and had no significant improvement initially. Hence, repeat chest x-ray was done which showed new patchy multifocal pneumonia despite being on antibiotics. Patient was then placed on broad-spectrum antibiotics with vancomycin and Zosyn. He was count continued on steroids nebulizer treatments, today, oxygen. BiPAP was ordered when necessary. CT of the chest was done which showed extensive bilateral cavitary pneumonia with areas of necrosis with endobronchial material occluding the left lower lobe bronchus. Pulmonology consultation was obtained.  Sputum culture showed a moderate MRSA sensitive to vancomycin. Antibiotics were changed to oral Augmentin and Zyvox for polymicrobial coverage in addition to the pulmonary toileting per pulmonary recommendations. He has continued to improve progressively. I also discussed with Dr. Cliffton Asters (infectious disease) who recommended continuing antibiotics for 4 more weeks. He has an appointment set up with  pulmonology and he would require another chest x-ray for followup.  Leukocytosis: Likely secondary to acute illness vs steroids effect vs leukemoid reaction, Solumedrol was dc'ed on 07/04/12. C. difficile was negative.  COPD (chronic obstructive pulmonary disease): Cont flutter valve, xopenex nebs, O2, antibiotics   Oral thrush: Continue Magic mouthwash and oral fluconazole   Pharyngeal dysphagia:  - cont Dysphagia 3 diet with nectar thick liquids. Placed on Ensure per patient's preference .   GERD: cont PPI, increased to BID and Maalox PRN  Valvular heart disease: known mild aortic stenosis, moderate mitral stenosis and moderate tricuspid regurgitation, per 2D Echocardiogram of 09/22/11 EF 55-60% . He follows up with Dr Willa Rough, cardiologist.      Day of Discharge BP 113/59  Pulse 91  Temp 97.3 F (36.3 C) (Oral)  Resp 18  Ht 6' (1.829 m)  Wt 82.555 kg (182 lb)  BMI 24.68 kg/m2  SpO2 96%  Physical Exam: General: Alert and awake oriented x3 not in any acute distress. HEENT: anicteric sclera, pupils reactive to light and accommodation CVS: S1-S2 clear no murmur rubs or gallops Chest: b/l rhonchi improving Abdomen: soft nontender, nondistended, normal bowel sounds, no organomegaly Extremities: no cyanosis, clubbing or edema noted bilaterally Neuro: Cranial nerves II-XII intact, no focal neurological deficits   The results of significant diagnostics from this hospitalization (including imaging, microbiology, ancillary and laboratory) are listed below for reference.    LAB  RESULTS: Basic Metabolic Panel:  Lab 07/12/12 1610 07/11/12 0505  NA 134* 138  K 3.5 3.8  CL 100 104  CO2 29 29  GLUCOSE 111* 111*  BUN 55* 49*  CREATININE 1.39* 1.17  CALCIUM 8.2* 8.3*  MG -- --  PHOS -- --   CBC:  Lab 07/12/12 0520 07/11/12 0505  WBC 20.1* 19.6*  NEUTROABS -- --  HGB 12.6* 13.5  HCT 38.9* 40.8  MCV 89.2 --  PLT 136* 140*    Significant Diagnostic Studies:  Dg Chest 2 View  06/27/2012  *RADIOLOGY REPORT*  Clinical Data: Cough, congestion, shortness of breath  CHEST - 2 VIEW  Comparison: 04/13/2011  Findings: Stable elevation of the left hemidiaphragm with associated left basilar atelectasis / scarring.  Chronic interstitial markings without focal consolidation. No pleural effusion or pneumothorax.  The heart is top normal in size.  Postsurgical changes related to prior CABG.  Degenerative changes of the visualized thoracolumbar spine.  IMPRESSION: Chronic left basilar scarring/atelectasis.  No evidence of acute cardiopulmonary disease.   Original Report Authenticated By: Charline Bills, M.D.      Disposition and Follow-up:     Discharge Orders    Future Appointments: Provider: Department: Dept Phone: Center:   07/19/2012 3:00 PM Julio Sicks, NP [facility] [phone] None    9:30 AM Michele Mcalpine, MD [facility] [phone] None     Future Orders Please Complete By Expires   Increase activity slowly      Discharge instructions      Comments:   Discharge diet: Dysphagia 3 with nectar thick liquids, sit upright, aspiration precautions.PILLS WITH WATER PER PT REQUEST. Pt may have thin water between meals between meals, nectar with meals. Please use FLUTTER VALVE.       DISPOSITION: SNF DIET: please see above in instructions ACTIVITY: continue Physical therapy, pulmonary toiletting, flutter valve, Nebs q6hours and q2hrs PRN   DISCHARGE FOLLOW-UP Follow-up Information    Follow up with PARRETT,TAMMY, NP. On  07/19/2012. (at 3:00 pm, you will need chest Xray  on your appointment)    Contact information:   [facility] [address] [place] [address] [phone]       Follow up with Michele Mcalpine, MD. On 08/06/2012. (at 9:30 AM)    Contact information:   [facility] [address] [place] [address] [phone]          Time spent on Discharge: 45 minutes  Signed:   Rashaun Wichert M.D. [facility] 07/12/2012, 11:41 AM Pager: [phone]

## 2012-07-12 NOTE — Progress Notes (Signed)
Patient cleared for discharge. Packet copied and placed in [place]. ptar called for transportation. Patient and patient's granddaughter at bedside informed and agreeable.  Tandy Lewin C. Rayelle Armor MSW, LCSW [phone]

## 2012-07-12 NOTE — Progress Notes (Signed)
Occupational Therapy Treatment Patient Details Name: Christian [name] MRN: [id number] DOB: 25-Dec-1931 Today's Date: 07/12/2012 Time: 6295-2841 OT Time Calculation (min): 14 min  OT Assessment / Plan / Recommendation Comments on Treatment Session      Follow Up Recommendations  SNF    Barriers to Discharge       Equipment Recommendations  None recommended by OT    Recommendations for Other Services    Frequency Min 2X/week   Plan Discharge plan remains appropriate    Precautions / Restrictions Precautions Precautions: Fall Precaution Comments: 3 lts O2 Restrictions Weight Bearing Restrictions: No   Pertinent Vitals/Pain Pt denied pain    ADL  Lower Body Bathing: Performed;Supervision/safety Where Assessed - Lower Body Bathing: Supported sit to stand Toilet Transfer: Buyer, retail Method: Sit to Barista: Other (comment) (recliner) ADL Comments: Pt stood for several minutes to bathe front and back peri area. Pt continues to fatigue quickly.    OT Diagnosis:    OT Problem List:   OT Treatment Interventions:     OT Goals ADL Goals ADL Goal: Toilet Transfer - Progress: Progressing toward goals ADL Goal: Additional Goal #1 - Progress: Progressing toward goals  Visit Information  Last OT Received On: 07/12/12 Assistance Needed: +1    Subjective Data  Subjective: I'm leaving today.   Prior Functioning       Cognition  Overall Cognitive Status: Appears within functional limits for tasks assessed/performed Arousal/Alertness: Awake/alert Orientation Level: Appears intact for tasks assessed Behavior During Session: [facility] for tasks performed    Mobility  Shoulder Instructions Bed Mobility Bed Mobility: Not assessed Details for Bed Mobility Assistance: Pt OOB in recliner Transfers Sit to Stand: 5: Supervision;4: Min guard;From chair/3-in-1 Stand to Sit: 5: Supervision;4: Min guard Details for Transfer  Assistance: increased time and good use of hands       Exercises      Balance     End of Session OT - End of Session Activity Tolerance: Patient limited by fatigue Patient left: Other (comment) (ambulating with PTA.)  GO     Helaine Yackel A OTR/L [phone] 07/12/2012, 1:51 PM

## 2012-07-18 ENCOUNTER — Telehealth: Payer: Self-pay | Admitting: Pulmonary Disease

## 2012-07-18 NOTE — Telephone Encounter (Signed)
SN is aware. 

## 2012-07-18 NOTE — Telephone Encounter (Signed)
i spoke with spouse and she stated she wanted to make SN aware that pt is going to have fluid drawn off his lungs today. FYI for SN

## 2012-07-19 ENCOUNTER — Inpatient Hospital Stay (HOSPITAL_COMMUNITY)
Admission: EM | Admit: 2012-07-19 | Discharge: 2012-08-09 | DRG: 163 | Disposition: A | Discharge status: Skilled Nursing Facility | Payer: Medicare Other | Attending: Thoracic Surgery (Cardiothoracic Vascular Surgery) | Admitting: Thoracic Surgery (Cardiothoracic Vascular Surgery)

## 2012-07-19 ENCOUNTER — Emergency Department (HOSPITAL_COMMUNITY): Payer: Medicare Other

## 2012-07-19 ENCOUNTER — Encounter (HOSPITAL_COMMUNITY): Payer: Self-pay | Admitting: Emergency Medicine

## 2012-07-19 ENCOUNTER — Ambulatory Visit (INDEPENDENT_AMBULATORY_CARE_PROVIDER_SITE_OTHER)
Admission: RE | Admit: 2012-07-19 | Discharge: 2012-07-19 | Disposition: A | Discharge status: Home / Self Care | Payer: Medicare Other | Source: Ambulatory Visit | Attending: Adult Health | Admitting: Adult Health

## 2012-07-19 ENCOUNTER — Encounter: Payer: Self-pay | Admitting: Adult Health

## 2012-07-19 ENCOUNTER — Ambulatory Visit (INDEPENDENT_AMBULATORY_CARE_PROVIDER_SITE_OTHER): Payer: Medicare Other | Admitting: Adult Health

## 2012-07-19 VITALS — BP 110/64 | HR 99 | Temp 97.6°F | Ht 72.0 in | Wt 186.6 lb

## 2012-07-19 DIAGNOSIS — J690 Pneumonitis due to inhalation of food and vomit: Secondary | ICD-10-CM

## 2012-07-19 DIAGNOSIS — N139 Obstructive and reflux uropathy, unspecified: Secondary | ICD-10-CM | POA: Diagnosis present

## 2012-07-19 DIAGNOSIS — J441 Chronic obstructive pulmonary disease with (acute) exacerbation: Secondary | ICD-10-CM | POA: Diagnosis present

## 2012-07-19 DIAGNOSIS — R609 Edema, unspecified: Secondary | ICD-10-CM

## 2012-07-19 DIAGNOSIS — J90 Pleural effusion, not elsewhere classified: Secondary | ICD-10-CM | POA: Diagnosis present

## 2012-07-19 DIAGNOSIS — Z87891 Personal history of nicotine dependence: Secondary | ICD-10-CM

## 2012-07-19 DIAGNOSIS — E8779 Other fluid overload: Secondary | ICD-10-CM | POA: Diagnosis not present

## 2012-07-19 DIAGNOSIS — N138 Other obstructive and reflux uropathy: Secondary | ICD-10-CM | POA: Diagnosis present

## 2012-07-19 DIAGNOSIS — J852 Abscess of lung without pneumonia: Secondary | ICD-10-CM

## 2012-07-19 DIAGNOSIS — E873 Alkalosis: Secondary | ICD-10-CM | POA: Diagnosis present

## 2012-07-19 DIAGNOSIS — J9621 Acute and chronic respiratory failure with hypoxia: Secondary | ICD-10-CM

## 2012-07-19 DIAGNOSIS — J869 Pyothorax without fistula: Secondary | ICD-10-CM | POA: Diagnosis present

## 2012-07-19 DIAGNOSIS — R600 Localized edema: Secondary | ICD-10-CM | POA: Diagnosis present

## 2012-07-19 DIAGNOSIS — I10 Essential (primary) hypertension: Secondary | ICD-10-CM | POA: Diagnosis present

## 2012-07-19 DIAGNOSIS — I2510 Atherosclerotic heart disease of native coronary artery without angina pectoris: Secondary | ICD-10-CM | POA: Diagnosis present

## 2012-07-19 DIAGNOSIS — J850 Gangrene and necrosis of lung: Secondary | ICD-10-CM

## 2012-07-19 DIAGNOSIS — N401 Enlarged prostate with lower urinary tract symptoms: Secondary | ICD-10-CM | POA: Diagnosis present

## 2012-07-19 DIAGNOSIS — Z85820 Personal history of malignant melanoma of skin: Secondary | ICD-10-CM

## 2012-07-19 DIAGNOSIS — I4891 Unspecified atrial fibrillation: Secondary | ICD-10-CM | POA: Diagnosis present

## 2012-07-19 DIAGNOSIS — R110 Nausea: Secondary | ICD-10-CM | POA: Diagnosis not present

## 2012-07-19 DIAGNOSIS — Z79899 Other long term (current) drug therapy: Secondary | ICD-10-CM

## 2012-07-19 DIAGNOSIS — E785 Hyperlipidemia, unspecified: Secondary | ICD-10-CM | POA: Diagnosis present

## 2012-07-19 DIAGNOSIS — J9819 Other pulmonary collapse: Secondary | ICD-10-CM | POA: Diagnosis not present

## 2012-07-19 DIAGNOSIS — K219 Gastro-esophageal reflux disease without esophagitis: Secondary | ICD-10-CM | POA: Diagnosis present

## 2012-07-19 DIAGNOSIS — R1313 Dysphagia, pharyngeal phase: Secondary | ICD-10-CM | POA: Diagnosis present

## 2012-07-19 DIAGNOSIS — D62 Acute posthemorrhagic anemia: Secondary | ICD-10-CM | POA: Diagnosis not present

## 2012-07-19 DIAGNOSIS — IMO0002 Reserved for concepts with insufficient information to code with codable children: Secondary | ICD-10-CM | POA: Diagnosis not present

## 2012-07-19 DIAGNOSIS — I4892 Unspecified atrial flutter: Secondary | ICD-10-CM | POA: Diagnosis present

## 2012-07-19 DIAGNOSIS — G9349 Other encephalopathy: Secondary | ICD-10-CM | POA: Diagnosis not present

## 2012-07-19 DIAGNOSIS — J9811 Atelectasis: Secondary | ICD-10-CM | POA: Diagnosis present

## 2012-07-19 DIAGNOSIS — J449 Chronic obstructive pulmonary disease, unspecified: Secondary | ICD-10-CM | POA: Diagnosis present

## 2012-07-19 DIAGNOSIS — R5381 Other malaise: Secondary | ICD-10-CM | POA: Diagnosis present

## 2012-07-19 DIAGNOSIS — A4902 Methicillin resistant Staphylococcus aureus infection, unspecified site: Secondary | ICD-10-CM | POA: Diagnosis present

## 2012-07-19 DIAGNOSIS — J95821 Acute postprocedural respiratory failure: Secondary | ICD-10-CM | POA: Diagnosis not present

## 2012-07-19 DIAGNOSIS — I9589 Other hypotension: Secondary | ICD-10-CM | POA: Diagnosis not present

## 2012-07-19 DIAGNOSIS — J209 Acute bronchitis, unspecified: Secondary | ICD-10-CM

## 2012-07-19 DIAGNOSIS — E871 Hypo-osmolality and hyponatremia: Secondary | ICD-10-CM | POA: Diagnosis not present

## 2012-07-19 HISTORY — DX: Pneumonia, unspecified organism: J18.9

## 2012-07-19 LAB — TROPONIN I: Troponin I: <0.3 ng/mL (ref <0.30)

## 2012-07-19 LAB — BASIC METABOLIC PANEL
BUN: 52 mg/dL — ABNORMAL HIGH (ref 6–23)
CO2: 27 mEq/L (ref 19–32)
Calcium: 8.8 mg/dL (ref 8.4–10.5)
Chloride: 98 mEq/L (ref 96–112)
Creatinine, Ser: 1.13 mg/dL (ref 0.50–1.35)
GFR calc Af Amer: 70 mL/min — ABNORMAL LOW (ref >90)
GFR calc non Af Amer: 60 mL/min — ABNORMAL LOW (ref >90)
Glucose, Bld: 112 mg/dL — ABNORMAL HIGH (ref 70–99)
Potassium: 4.7 mEq/L (ref 3.5–5.1)
Sodium: 132 mEq/L — ABNORMAL LOW (ref 135–145)

## 2012-07-19 LAB — CBC
HCT: 32 % — ABNORMAL LOW (ref 39.0–52.0)
Hemoglobin: 10.7 g/dL — ABNORMAL LOW (ref 13.0–17.0)
MCH: 29.6 pg (ref 26.0–34.0)
MCHC: 33.4 g/dL (ref 30.0–36.0)
MCV: 88.4 fL (ref 78.0–100.0)
Platelets: 138 10*3/uL — ABNORMAL LOW (ref 150–400)
RBC: 3.62 MIL/uL — ABNORMAL LOW (ref 4.22–5.81)
RDW: 13.3 % (ref 11.5–15.5)
WBC: 11.2 10*3/uL — ABNORMAL HIGH (ref 4.0–10.5)

## 2012-07-19 LAB — PRO B NATRIURETIC PEPTIDE: Pro B Natriuretic peptide (BNP): 2817 pg/mL — ABNORMAL HIGH (ref 0–450)

## 2012-07-19 MED ORDER — DUTASTERIDE 0.5 MG PO CAPS
0.5000 mg | ORAL_CAPSULE | Freq: Every day | ORAL | Status: DC
  Administered 2012-07-20: 0.5 mg via ORAL
  Filled 2012-07-19 (×3): qty 1

## 2012-07-19 MED ORDER — TAMSULOSIN HCL 0.4 MG PO CAPS
0.4000 mg | ORAL_CAPSULE | Freq: Two times a day (BID) | ORAL | Status: DC
  Administered 2012-07-20 (×3): 0.4 mg via ORAL
  Filled 2012-07-19 (×7): qty 1

## 2012-07-19 MED ORDER — ALUM & MAG HYDROXIDE-SIMETH 200-200-20 MG/5ML PO SUSP
30.0000 mL | Freq: Four times a day (QID) | ORAL | Status: DC | PRN
  Administered 2012-07-26 – 2012-08-08 (×4): 30 mL via ORAL
  Filled 2012-07-19 (×4): qty 30

## 2012-07-19 MED ORDER — DEXTROSE 5 % IV SOLN
1.0000 g | Freq: Three times a day (TID) | INTRAVENOUS | Status: DC
  Filled 2012-07-19: qty 1

## 2012-07-19 MED ORDER — IPRATROPIUM BROMIDE 0.02 % IN SOLN: 0.5000 mg | RESPIRATORY_TRACT | Status: DC | PRN

## 2012-07-19 MED ORDER — ALBUTEROL SULFATE (5 MG/ML) 0.5% IN NEBU
5.0000 mg | INHALATION_SOLUTION | Freq: Once | RESPIRATORY_TRACT | Status: AC
  Administered 2012-07-19: 5 mg via RESPIRATORY_TRACT
  Filled 2012-07-19: qty 1

## 2012-07-19 MED ORDER — SODIUM CHLORIDE 0.9 % IJ SOLN: 3.0000 mL | INTRAMUSCULAR | Status: DC | PRN

## 2012-07-19 MED ORDER — DEXTROSE 5 % IV SOLN
1.0000 g | INTRAVENOUS | Status: DC
  Administered 2012-07-19: 18:00:00 via INTRAVENOUS
  Filled 2012-07-19: qty 1

## 2012-07-19 MED ORDER — POLYETHYLENE GLYCOL 3350 17 G PO PACK
17.0000 g | PACK | Freq: Every day | ORAL | Status: DC | PRN
  Filled 2012-07-19: qty 1

## 2012-07-19 MED ORDER — FUROSEMIDE 10 MG/ML IJ SOLN
INTRAMUSCULAR | Status: AC
  Administered 2012-07-20: 20 mg via INTRAVENOUS
  Filled 2012-07-19: qty 4

## 2012-07-19 MED ORDER — LEVOFLOXACIN IN D5W 750 MG/150ML IV SOLN
750.0000 mg | INTRAVENOUS | Status: DC
  Administered 2012-07-19: 750 mg via INTRAVENOUS
  Filled 2012-07-19: qty 150

## 2012-07-19 MED ORDER — ACETAMINOPHEN 325 MG PO TABS: 650.0000 mg | ORAL_TABLET | Freq: Four times a day (QID) | ORAL | Status: DC | PRN

## 2012-07-19 MED ORDER — FUROSEMIDE 10 MG/ML IJ SOLN
20.0000 mg | Freq: Two times a day (BID) | INTRAMUSCULAR | Status: DC
  Administered 2012-07-20: 20 mg via INTRAVENOUS
  Filled 2012-07-19 (×2): qty 2

## 2012-07-19 MED ORDER — ENOXAPARIN SODIUM 30 MG/0.3ML ~~LOC~~ SOLN
30.0000 mg | Freq: Every day | SUBCUTANEOUS | Status: DC
  Administered 2012-07-20 (×2): 30 mg via SUBCUTANEOUS
  Filled 2012-07-19 (×4): qty 0.3

## 2012-07-19 MED ORDER — SODIUM CHLORIDE 0.9 % IV SOLN
Freq: Once | INTRAVENOUS | Status: AC
Start: 2012-07-19 — End: 2012-07-19
  Administered 2012-07-19: 18:00:00 via INTRAVENOUS

## 2012-07-19 MED ORDER — LEVOFLOXACIN IN D5W 750 MG/150ML IV SOLN: 750.0000 mg | INTRAVENOUS | Status: DC

## 2012-07-19 MED ORDER — VANCOMYCIN HCL 1000 MG IV SOLR
750.0000 mg | Freq: Two times a day (BID) | INTRAVENOUS | Status: DC
  Administered 2012-07-19 – 2012-07-20 (×2): 750 mg via INTRAVENOUS
  Filled 2012-07-19 (×3): qty 750

## 2012-07-19 MED ORDER — PANTOPRAZOLE SODIUM 40 MG PO TBEC
40.0000 mg | DELAYED_RELEASE_TABLET | Freq: Two times a day (BID) | ORAL | Status: DC
  Administered 2012-07-20 (×2): 40 mg via ORAL
  Filled 2012-07-19 (×3): qty 1

## 2012-07-19 MED ORDER — OXYCODONE HCL 5 MG PO TABS
5.0000 mg | ORAL_TABLET | ORAL | Status: DC | PRN
  Administered 2012-07-19 – 2012-07-21 (×3): 5 mg via ORAL
  Filled 2012-07-19 (×3): qty 1

## 2012-07-19 MED ORDER — ALBUTEROL SULFATE (5 MG/ML) 0.5% IN NEBU: 2.5000 mg | INHALATION_SOLUTION | RESPIRATORY_TRACT | Status: DC | PRN

## 2012-07-19 MED ORDER — GUAIFENESIN-DM 100-10 MG/5ML PO SYRP
5.0000 mL | ORAL_SOLUTION | ORAL | Status: DC | PRN
  Administered 2012-07-20 – 2012-07-21 (×2): 5 mL via ORAL
  Filled 2012-07-19 (×2): qty 5

## 2012-07-19 MED ORDER — ACETAMINOPHEN 650 MG RE SUPP: 650.0000 mg | Freq: Four times a day (QID) | RECTAL | Status: DC | PRN

## 2012-07-19 NOTE — H&P (Signed)
PCP:   Michele Mcalpine, MD   Chief Complaint:  Pleural effusion  HPI: This is a previously healthy 78 year old gentleman who was admitted with COPD exacerbation diagnosed with necrotizing community-acquired MRSA pneumonia. The patient aspirated during his inpatient stay, swallow study showed silent aspiration. He had a short stay in the ICU. The patient was discharged approximately week ago to [facility]. Since discharge he has received his antibiotics, he has remained short of breath, he was oxygen dependent and hypoxic at the time of discharge. His daughter noted he is developing some lower extremity edema. Today he had a follow doctor's appointment, chest x-ray revealed possible worsening pneumonia and a pleural effusion. He was sent to the ER to be evaluated. History provided by patient, a granddaughter who is a Engineer, civil (consulting) here at [facility] and his daughters at the bedside.  Review of Systems:  The patient denies anorexia, fever, weight loss,, vision loss, decreased hearing, hoarseness, chest pain, syncope, dyspnea on exertion, peripheral edema, balance deficits, hemoptysis, abdominal pain, melena, hematochezia, severe indigestion/heartburn, hematuria, incontinence, genital sores, muscle weakness, suspicious skin lesions, transient blindness, difficulty walking, depression, unusual weight change, abnormal bleeding, enlarged lymph nodes, angioedema, and breast masses.  Past Medical History: Past Medical History  Diagnosis Date  . Allergic rhinitis   . COPD (chronic obstructive pulmonary disease)   . Disorders of diaphragm   . Acute bronchitis   . Hypertension   . CAD (coronary artery disease)     Catheterization 2009, mild coronary disease (after abnormal nuclear study,With hypotensive response to exercise, 2009)  . RBBB   . Atrial flutter     Ablated in the past, no recurrence  . Venous insufficiency   . Edema   . Dyslipidemia     Patient has an and to use statin  . GERD  (gastroesophageal reflux disease)   . Diverticulosis of colon   . Hypertrophy of prostate with urinary obstruction and other lower urinary tract symptoms (LUTS)   . Increased prostate specific antigen (PSA) velocity   . DJD (degenerative joint disease)   . Chronic insomnia   . Malignant melanoma     Removed from mediastinum via thoracotomy July, 2010  . Ejection fraction     EF 60%,Echo, 2009,  /  EF 55-60%, echo, February, 2013  . Aortic stenosis     Mild, echo and catheterization, 2009 /  Mild, echo, February, 2013  . Mitral stenosis     Very mild, echo, from mitral annular calcification  . Shortness of breath     Episodes of feeling shortness of breath with some mild dizziness with mild exertion., February, 2013  . Tricuspid regurgitation     Moderate, echo, February, 2013, 56 mmHg  . Pulmonary hypertension     56 mmHg, echo, February, 2013  . Pneumonia    Past Surgical History  Procedure Date  . Bilateral inguinal hernia repair   . Umbilical hernia repair   . Median sternotomy for mediastinal melanoma 1980s    Medications: Prior to Admission medications   Medication Sig Start Date End Date Taking? Authorizing Provider  alum & mag hydroxide-simeth (MAALOX/MYLANTA) 200-200-20 MG/5ML suspension Take 30 mLs by mouth every 6 (six) hours as needed. Stomach discomfort. 07/12/12  Yes Ripudeep Jenna Luo, MD  Alum & Mag Hydroxide-Simeth (MAGIC MOUTHWASH) SOLN Take 5 mLs by mouth 4 (four) times daily. Swish and swallow. 07/12/12  Yes Ripudeep Jenna Luo, MD  amoxicillin-clavulanate (AUGMENTIN) 875-125 MG per tablet Take 1 tablet by mouth 2 (two) times  daily. X 4 weeks, stop date Jan 6th. 07/12/12  Yes Ripudeep Jenna Luo, MD  AVODART 0.5 MG capsule TAKE 1 CAPSULE EVERY DAY 07/15/11  Yes Michele Mcalpine, MD  etodolac (LODINE XL) 400 MG 24 hr tablet TAKE 1 TABLET (400 MG TOTAL) BY MOUTH DAILY. 04/22/12  Yes Michele Mcalpine, MD  guaiFENesin-dextromethorphan (ROBITUSSIN DM) 100-10 MG/5ML syrup Take 5 mLs by  mouth every 4 (four) hours as needed for cough. 07/12/12  Yes Ripudeep K Rai, MD  ipratropium (ATROVENT) 0.02 % nebulizer solution Take 2.5 mLs (0.5 mg total) by nebulization 3 (three) times daily. 07/12/12  Yes Ripudeep Jenna Luo, MD  levalbuterol (XOPENEX) 0.63 MG/3ML nebulizer solution Take 3 mLs (0.63 mg total) by nebulization 3 (three) times daily. 07/12/12  Yes Ripudeep Jenna Luo, MD  linezolid (ZYVOX) 600 MG tablet Take 1 tablet (600 mg total) by mouth every 12 (twelve) hours. For 4 weeks, stop date Jan 6th, 2014 07/12/12  Yes Ripudeep Jenna Luo, MD  oxyCODONE (OXY IR/ROXICODONE) 5 MG immediate release tablet Take 5 mg by mouth every 4 (four) hours as needed. 07/12/12  Yes Ripudeep Jenna Luo, MD  pantoprazole (PROTONIX) 40 MG tablet Take 1 tablet (40 mg total) by mouth 2 (two) times daily before a meal. 07/12/12  Yes Ripudeep K Rai, MD  polyethylene glycol (MIRALAX / GLYCOLAX) packet Take 17 g by mouth daily as needed (constipation). 07/12/12  Yes Ripudeep Jenna Luo, MD  Tamsulosin HCl (FLOMAX) 0.4 MG CAPS Take 0.4 mg by mouth 2 (two) times daily.    Yes Historical Provider, MD  trospium (SANCTURA) 20 MG tablet Take 1 tablet (20 mg total) by mouth 2 (two) times daily. 07/12/12  Yes Ripudeep Jenna Luo, MD    Allergies:   Allergies  Allergen Reactions  . Ivp Dye (Iodinated Diagnostic Agents)     Social History:  reports that he quit smoking about 51 years ago. He has never used smokeless tobacco. He reports that he does not drink alcohol or use illicit drugs. currently at [facility], on home oxygen. Patient's power of attorney is Ann Held [phone] (c). Currently using a walker.  Family History: Negative for hypertension, diabetes, coronary artery disease  Physical Exam: Filed Vitals:   07/19/12 1711 07/19/12 1717 07/19/12 1731 07/19/12 1955  BP:   107/70   Pulse:   112   Resp:   29   Height:  6' 0.05" (1.83 m)    Weight:  84.6 kg (186 lb 8.2 oz)    SpO2: 98%  100% 98%    General:  Alert and oriented  times three, well developed and nourished, no acute distress Eyes: PERRLA, pink conjunctiva, no scleral icterus ENT: Moist oral mucosa, neck supple, no thyromegaly Lungs: Decreased breath sounds on the left, otherwise clear, no wheeze, no crackles, no use of accessory muscles Cardiovascular: regular rate and rhythm, no regurgitation, no gallops, no murmurs. No carotid bruits, no JVD Abdomen: soft, positive BS, non-tender, non-distended, no organomegaly, not an acute abdomen GU: not examined Neuro: CN II - XII grossly intact, sensation intact Musculoskeletal: strength 5/5 all extremities, no clubbing, cyanosis or 1+ bilateral lower extremity pitting edema Skin: no rash, no subcutaneous crepitation, no decubitus Psych: appropriate patient   Labs on Admission:   [facility] 07/19/12 1805  NA 132*  K 4.7  CL 98  CO2 27  GLUCOSE 112*  BUN 52*  CREATININE 1.13  CALCIUM 8.8  MG --  PHOS --     Basename 07/19/12 1805  WBC 11.2*  NEUTROABS --  HGB 10.7*  HCT 32.0*  MCV 88.4  PLT 138*    Basename 07/19/12 1805  CKTOTAL --  CKMB --  CKMBINDEX --  TROPONINI <0.30    Micro Results: No results found for this or any previous visit (from the past 240 hour(s)).   Radiological Exams on Admission: Dg Chest 2 View  07/19/2012  *RADIOLOGY REPORT*  Clinical Data: Pneumonia.  Cough, shortness of breath and chest pain.  CHEST - 2 VIEW  Comparison: Plain films of the chest 07/06/2012.  CT chest 07/02/2012.  Findings: There has been marked increase in the patient's left pleural effusion.  Smaller right pleural effusion has also increased.  Extensive airspace disease in the left chest has worsened.  Right basilar airspace opacity does not appear notably changed.  Cardiac silhouette is obscured.  The patient is status post CABG.  IMPRESSION:  1.  Increased left much larger than right pleural effusions. 2.  Extensive left side airspace disease has worsened.  Right basilar airspace opacity is  unchanged.   Original Report Authenticated By: Holley Dexter, M.D.     Assessment/Plan Present on Admission:  . Pleural effusion . Necrotizing pneumonia, community acquired MRSA . COPD (chronic obstructive pulmonary disease) Oxygen requirements, recent with MRSA pneumonia  Will readmit patient Antibiotics adjusted, patient now on cefepime, Levaquin, vancomycin. The Zyvox and Augmentin have been discontinued. Consult interventional radiologist for therapeutic thoracentesis  Oxygen to keep sats greater than 88%  Nebulizers as needed, antitussives Defer to a.m. team pulmonary or ID reconsult  Mild fluid overload  IV Lasix ordered  . ESSENTIAL HYPERTENSION . CAD (coronary artery disease) History of melanoma   stable home medications resumed . Dysphagia, pharyngeal Mechanical soft nectar thick diet  Full code DVT prophylaxis  Toretto Tingler 07/19/2012, 8:52 PM

## 2012-07-19 NOTE — Progress Notes (Signed)
ANTIBIOTIC CONSULT NOTE - INITIAL  Pharmacy Consult for Vancomycin & RX to adjust ABX for renal function Indication: Worsening Pneumonia  Allergies  Allergen Reactions  . Ivp Dye (Iodinated Diagnostic Agents)     Patient Measurements: Height: 6' 0.05" (183 cm) Weight: 186 lb 8.2 oz (84.6 kg) IBW/kg (Calculated) : 77.71    Vital Signs: Temp: 97.6 F (36.4 C) (12/17 1522) Temp src: Oral (12/17 1522) BP: 110/64 mmHg (12/17 1522) Pulse Rate: 99  (12/17 1522) Intake/Output from previous day:   Intake/Output from this shift:    Labs: No results found for this basename: WBC:3,HGB:3,PLT:3,LABCREA:3,CREATININE:3 in the last 72 hours Estimated Creatinine Clearance: 48.1 ml/min (by C-G formula based on Cr of 1.39). No results found for this basename: VANCOTROUGH:2,VANCOPEAK:2,VANCORANDOM:2,GENTTROUGH:2,GENTPEAK:2,GENTRANDOM:2,TOBRATROUGH:2,TOBRAPEAK:2,TOBRARND:2,AMIKACINPEAK:2,AMIKACINTROU:2,AMIKACIN:2, in the last 72 hours   Microbiology: Recent Results (from the past 720 hour(s))  CULTURE, RESPIRATORY     Status: Normal   Collection Time   07/01/12 11:20 PM      Component Value Range Status Comment   Specimen Description SPUTUM   Final    Special Requests NONE   Final    Gram Stain     Final    Value: ABUNDANT WBC PRESENT,BOTH PMN AND MONONUCLEAR     MODERATE SQUAMOUS EPITHELIAL CELLS PRESENT     MODERATE GRAM POSITIVE COCCI IN CLUSTERS     IN PAIRS IN CHAINS RARE GRAM POSITIVE RODS   Culture     Final    Value: MODERATE METHICILLIN RESISTANT STAPHYLOCOCCUS AUREUS     Note: RIFAMPIN AND GENTAMICIN SHOULD NOT BE USED AS SINGLE DRUGS FOR TREATMENT OF STAPH INFECTIONS. This organism DOES NOT demonstrate inducible Clindamycin resistance in vitro. CRITICAL RESULT CALLED TO, READ BACK BY AND VERIFIED WITH: REEVES RN 9AM      07/04/12 GUSTK   Report Status 07/04/2012 FINAL   Final    Organism ID, Bacteria METHICILLIN RESISTANT STAPHYLOCOCCUS AUREUS   Final   MRSA PCR SCREENING      Status: Abnormal   Collection Time   07/02/12 10:58 AM      Component Value Range Status Comment   MRSA by PCR POSITIVE (*) NEGATIVE Final   CLOSTRIDIUM DIFFICILE BY PCR     Status: Normal   Collection Time   07/07/12  1:05 PM      Component Value Range Status Comment   C difficile by pcr NEGATIVE  NEGATIVE Final     Medical History: Past Medical History  Diagnosis Date  . Allergic rhinitis   . COPD (chronic obstructive pulmonary disease)   . Disorders of diaphragm   . Acute bronchitis   . Hypertension   . CAD (coronary artery disease)     Catheterization 2009, mild coronary disease (after abnormal nuclear study,With hypotensive response to exercise, 2009)  . RBBB   . Atrial flutter     Ablated in the past, no recurrence  . Venous insufficiency   . Edema   . Dyslipidemia     Patient has an and to use statin  . GERD (gastroesophageal reflux disease)   . Diverticulosis of colon   . Hypertrophy of prostate with urinary obstruction and other lower urinary tract symptoms (LUTS)   . Increased prostate specific antigen (PSA) velocity   . DJD (degenerative joint disease)   . Chronic insomnia   . Malignant melanoma     Removed from mediastinum via thoracotomy July, 2010  . Ejection fraction     EF 60%,Echo, 2009,  /  EF 55-60%, echo, February, 2013  .  Aortic stenosis     Mild, echo and catheterization, 2009 /  Mild, echo, February, 2013  . Mitral stenosis     Very mild, echo, from mitral annular calcification  . Shortness of breath     Episodes of feeling shortness of breath with some mild dizziness with mild exertion., February, 2013  . Tricuspid regurgitation     Moderate, echo, February, 2013, 56 mmHg  . Pulmonary hypertension     56 mmHg, echo, February, 2013    Medications:  Scheduled:   Infusions:    . ceFEPime (MAXIPIME) IV    . levofloxacin (LEVAQUIN) IV    . vancomycin    . [DISCONTINUED] ceFEPime (MAXIPIME) IV    . [DISCONTINUED] levofloxacin (LEVAQUIN) IV      Assessment  78 year old male with 11/29 MRSA confirmed pneumonia.  PTA on Augmentin 875mg  BID and 4 week course of Zyvox 600mg  po BID (scheduled to end 08/08/12)  Pt with continued shortness of breath.  CXR noted for worsening pneumonia  Admitted from Dr's office to receive Vancomycin per pharmacy x 8 days, cefepime x 8 days and levaquin x 3 days.  Pharmacy asked to adjust antibiotics for renal function  CrCl (CG) = 48 ml/min,  CrCl (n) = 44 ml/min  Goal of Therapy:  Vancomycin trough level 15-20 mcg/ml  Plan:  Measure antibiotic drug levels at steady state Follow up culture results  Adjust Levaquin to 750mg  IV q48h x 3 days (for CrCl < 50 ml/min)  Adjust Cefepime to 1gm IV q24h x 8 days (for CrCl < 50 ml/min)  Vancomycin 750mg  IV q12h  Courtnie Brenes, Joselyn Glassman, PharmD 07/19/2012,5:27 PM

## 2012-07-19 NOTE — Assessment & Plan Note (Signed)
Worsening necrotizing PNA w/ left enlarging pleural effusion  W/ suspected fluid overload  Not responding to OP therapy despite aggressive abx use with Augmentin and Zyvox  Will require readmission , transport to ER at [facility] for evaluation and consideration for admission via [facility].  Case discussed with Dr. Kriste Basque  And Dr. Sherene Sires

## 2012-07-19 NOTE — ED Notes (Signed)
[id number]<QM> Expected date:07/19/12<BR> Expected time: 4:44 PM<BR> Means of arrival:Ambulance<BR> Comments:<BR> SOB

## 2012-07-19 NOTE — Patient Instructions (Signed)
We are transporting you to ER at [facility]

## 2012-07-19 NOTE — Progress Notes (Signed)
Subjective:    Patient ID: Christian [name], male    DOB: Jan 06, 1932, 78 y.o.   MRN: [id number]  HPI 78 y/o WM. he has mult medical problems as noted below...  Followed for general medical purposes w/ hx COPD, ex-smoker, elev right hemidiaph, CAD & mild AS followed by Delton See, Hx AFlutter s/p ablation in 2003 by DrTaylor, Hypercholesterolemia on diet alone, BPH on 3 meds per Urology, and hx MM in mediastinum 1980's (no recurrence)...  ~  April 13, 2011:  8-65mo ROV & he's noted some DOE & some intermittent heartburn; we discussed a regular exercise program & taking his PPI 30 min before a meal for max benefit; he wants oral Nystatin suspension for mouth symptoms...    COPD> remote smoking hx, stable on AdvairBid & ProairPrn, he insists on Tussionex for cough & ZPak to keep on hand; has elev left hemidiaph, prev median sternotomy for mediastinal melanoma surg in the 1980s; prev CXR & PFTs below==> f/u CXR today chr changes left base, NAD...    HBP> controlled on Atenolol & BP= 120/66 today w/o CP, palpit, ch in SOB/ edema, etc; NOTE: he was prev on DiovanHCT & he stopped this on his own $$...    CAD, Valv heart dis> followed by Delton See on ASA & Aten, last seen 9/11 w/ f/u due soon...    RBBB, Hx AFlutter> stable & denies CP, palpit, dizzy, ch in DOE/ edema/ etc...    VI, Edema> he knows to elim sodium, elev legs, wear support hose, etc...    Hyperchol> on Fish Oil + Diet; he was rec to start Crestor5mg  but he declined statin therapy & prefers diet alone...    GI> GERD, Divertics> on Prilosec 20mg  + Zantac Qhs; he wants stronger acid suppressor & we will switch to Nexium...    BPH, Elev PSA> on Flomax, Avodart & Sanctura per DrPeterson etal...    DJD> on Etodolac prn...    Chronic persistant insomnia> on Ambien Qhs...    Hx Malig Melanoma> he has surg in the 1980's (mediastinal melanoma s/p median sternotomy) & no know recurrence; he is followed for Derm by DrDJones & had a squamous cell skin cancer  removed 3/12...  ~  May 22, 2011:  6 week ROV and add on for neurologic symptoms; he states that on 05/12/11 he saw DrDJones for several skin lesions, and was given Xylocaine on his face and arm (the lesions were basal cell carcinomas); later that day he developed pain over his right eye, headache, dizziness, confusion, and irritability; he states that his vision was blurry and that he was unsteady on his feet and perhaps slightly weak in the left leg; there was no CP, palpit, slurring of speech, etc; he also noted what he calls "migraine aurora" which he says he gets from time to time but has increased in frequency lately (he denies migraine pain "I just get the aurora"); he did not go to the emergency room with these symptoms, but rather called our office for evaluation; since I could not see him for several days we decided to proceed with lab work and CT brain; the lab work was all essentially within normal limits (except the PSA of 6.11); the CT brain showed essentially within normal limits, without mass, lesions intracranial hemorrhage, or acute infarct noted; as he still has some symptoms at present we felt it was best to refer him to Neurology, and suspect that they will want to get an MRI for further evaluation; in the  meanwhile it is recommended that he increase his 81 mg aspirin from a once a day to twice a day.  ~  October 12, 2011:  57mo ROV & he reports a thorough Neuro eval from DrWong: 1) MRI/ MRA of head & neck> tortuous vessels w/ diminutive left vertebral vs chr occlusion; ectasia of basilar art & both carotid siphons, no stenoses or major branch occlusions.Marland KitchenMarland Kitchen 2) EEG suggested an area of neuronal dysfunction in left temporal region w/ delta & theta activity... DrWong speculated that he might have had a migraine HA & rec incr ASA to 325mg /d; he is considering Tricyclic antidepressants if needed...    Pt also saw DrKatz 2/13 for f/u of his HBP, nonobstructive CAD, hx AFlutter w/ prev ablation,  Mild AS & MR> f/u 2DEcho showed normal LV size & function w/ EF=55-60%, normal wall motion, mildAS, modMR, atria mildly dil, moderate pulmHTN w/ PAsys=56...    He notes Urology f/u w/ DrEskridge now, on Triple therapy & voiding satis, they have decided NOT to do further PSA screening "we'll just do rectal exams from here on out" & notes last PSA was "four-something"...    He reports generally stable w/ only minor somatic complaints & states his SOB/ DOE is stable; advised exercise program; he still teaches "concealed carry" classes; he requests refill Flonase & cough syrup...  ~  February 11, 2012:  10mo ROV & [name] requested a thorough review of his meds to see if there is anything that can be stopped; we reviewed all his meds one at a time & I noted that he has several Prn meds (ProairHFA, Mucinex, Flonase, MMW, Ambien) and several supplements meds he chooses to take (Cranberry capsules, Glucosamine)...     He saw DrKatz for Cards f/u 4/13> they reviewed 2DEcho 2/13 that showed mild to mod AS & MS, norm LVF w/ EF= 55-60%, & mild to mod PulmHTN w/ PAsys=56; no change in meds, etc...    We reviewed prob list, meds, xrays and labs> see below>>  07/19/2012 Post Hospital follow up  Admitted 06/27/12 for Necrotizing pneumonia, community acquired MRSA, acute on chronic respiratory failure with hypoxia:  chest x-ray showed new patchy multifocal pneumonia despite being on antibiotics. Patient was then placed on broad-spectrum antibiotics with vancomycin and Zosyn. Required BIPAP support briefly.  CT of the chest was done which showed extensive bilateral cavitary pneumonia with areas of necrosis with endobronchial material occluding the left lower lobe bronchus. Pulmonology consultation was obtained. Sputum culture showed a moderate MRSA sensitive to vancomycin. Antibiotics were changed to oral Augmentin and Zyvox for polymicrobial coverage in addition to the pulmonary toileting per pulmonary recommendations. ID  consult for  antibiotics for 4 more weeks.  Required discharge to NH.   Since discharge , continues with cough , congestion, weakness and leg swelling.  Today in office, cxr shows increased left pleural effusion with worsening aspdz.  No hemoptysis , chest pain or fever.  Remains on Augmentin and Zyvox . (to finish in January) .             Problem List:   ALLERGIC RHINITIS (ICD-477.9) - uses OTC antihist Prn & Flonase spray...  COPD (ICD-496) & DISORDERS OF DIAPHRAGM (ICD-519.4) - he is an ex-smoker- having smoked <57yrs, and quit >73yrs ago... stable on ADVAIR 500 Prn, PROAIR Prn, MUCINEX 2Bid Prn... otherwise doing well, mows yard etc... still says he needs the TUSSIONEX vs Hydromet for cough... ~  SCANS:  9/03= elev L hemidaiph w/ scarring at base, &  RLL infiltrate resolved... ~  baseline CXR 2/08 - s/p median sternotomy (mediastinal melanoma surg 1980's), elev L hemidiaph & chr changes, NAD.Marland Kitchen. ~  f/u CXR 03/13/08 = similar chr changes on the left... ~  PFT 1/06 - FVC 2.46 (54%), FEV1= 1.64 (46%), %1sec=67, mid-flows=20%. FEV1 in 1998 was 1.89. ~  PFT 8/09 showed FVC= 2.61 (58%), FEV1= 1.73 (49%), %1sec=66, mid-flows= 29%... ~  CXR 1/11 showed stable COPD, NAD.Marland Kitchen. ~  CXR 9/12 showed chr changes at the left base, NAD...  ESSENTIAL HYPERTENSION (ICD-401.9) - he's been on ATENOLOL 25mg Bid, & BP noted to be mildly elevated 4/11 and started on DiovanHCT but he subsequently stopped this on his own in 2012... ~  6/11:  BP= 110/60 & similar at home... offered to ch Diovan but he prefers to continue 1/2 tab daily. ~  12/11:  BP= 122/68 & encouraged to continue both meds (Aten & DiovanHCT). ~  9/12:  BP= 120/66 on Aten alone (he stopped the Diovan on his own)... ~  10/12:  BP= 110/74 & their is some confusion that he might have restarted the DiovanHCT, & we decided to stop this med. ~  3/13:  BP= 110/60 on Atenolol monotherapy... ~  7/13:  BP= 126/70 & he denies CP, palpit, dizzy, ch in SOB, or  edema...  CORONARY ARTERY DISEASE (ICD-414.00) - takes ATENOLOL 25mg Bid, & ASA 325mg /d... followed by Delton See & his notes are reviewed. ~  EKG w/ RBBB, sinus rhythm w/ 1st degree AVB, LAD, NAD.Marland KitchenMarland Kitchen ~   min non-obstructive CAD on cath 7/03 w/ luminal irregs to 30% in Circ... ~  cath 6/09 showed 30-40% LAD, 40-50% RCA, & 20% CIRC = non-obstructive CAD  RIGHT BUNDLE BRANCH BLOCK (ICD-426.4) - baseline EKG is NSR, RBBB +first degree AVB/ LAD.Marland KitchenMarland Kitchen  VALVULAR HEART DISEASE (ICD-424.90) & Hx of FLUTTER, ATRIAL (ICD-427.32) - s/p cath ablation in 2003 by DrTaylor... ~  2DEcho 2/06 showed conc LVH, sclerotic AoV w/o AS, low-norm LVF... ~  2DEcho 6/09 showed mod calcif AoV w/ reduced leaflet excursion c/w mild AS, mod mitral annular calcif w/ mild MS, dil LA & RV, mild incr PA sys~ 40, norm LVF w/ EF=55-60% ~  2DEcho 2/13 showed normal LV size & function w/ EF=55-60%, normal wall motion, mildAS, modMR, atria mildly dil, moderate pulmHTN w/ PAsys=56...  VENOUS INSUFFICIENCY (ICD-459.81) w/ EDEMA (ICD-782.3) - treats w/ low sodium diet;  exam shows superfic VV, no signif edema, etc...  HYPERCHOLESTEROLEMIA (ICD-272.0) - on diet + FISH OIL... ~  FLP 6/08 showed TChol 180, TG 37, HDL 52, LDL 121... he was not interested in Statin therapy. ~  FLP 4/10 showed TChol 189, TG 34, HDL 53, LDL 130... not at goal- needs meds/ he prefers diet. ~  FLP 1/11 showed TChol 195, TG 38, HDL 54, LDL 133... needs better diet. ~  FLP 12/11 showed TChol 187, TG 35, HDL 56, LDL 124... He declines low dose statin therapy. ~  FLP 10/12 showed TChol 180, TG 34, HDL 50, LDL 123  GERD (ICD-530.81) - on OMEPRAZOLE 20mg /d, and ZANTAC 150mg Qhs... ~  9/12: presents c/o incr reflux/ heartburn> wants stronger PPI & switched to NEXIUM 40mg /d...  DIVERTICULOSIS OF COLON (ICD-562.10) - notes some constipation Rx w/ MIRALAX (really helps!).Marland Kitchen. ~  last colonoscopy 2/05 by DrPatterson showed divertics only...   HYPERTROPHY PROSTATE W/UR OBST &  OTH LUTS (ICD-600.01) - treated by DrPeterson w/ AVODART 0.5mg /d, FLOMAX 0.4mg Bid, & SANCTURA 20mg Bid...  ~  He has hx BPH, LUTS, & elev PSA on  ttriple therapy per Urology... ~  Urology was following his PSAs (last 4.95 w/ 21%Free 2/13)> DrEskridge & pt decided NOT to continue the PSA screening at his age & he is happy w/ this decision.  PSA, INCREASED (ICD-790.93) - pt states that PSA was as high as 9 and improved to 5.6.Marland KitchenMarland Kitchen pt reports recent eval for hematuria- note pending...  ~  10/10: he reports his PSA 10/10 DrPeterson was 5.3 ~  6/11: he reports that his PSA recently = 5.1 ~  12/11: PSA per DrPeterson recently = 5.28 & they continue to follow... ~  10/12: PSA here= 6.11 and we will send copy of lab to the Urology team... ~  Urology was following his PSAs (last 4.95 w/ 21%Free 2/13)> DrEskridge & pt decided NOT to continue the PSA screening at his age & he is happy w/ this decision.  DEGENERATIVE JOINT DISEASE (ICD-715.90) - on Mobic but he prefers ETODOLAC 400mg  Bid Prn.  Hx MIGRAINE HEADACHES >>  ~  10/12: He developed pain over his right eye, headache, dizziness, confusion, and irritability; he states that his vision was blurry and that he was unsteady on his feet and perhaps slightly weak in the left leg; there was no CP, palpit, slurring of speech, etc; he also noted what he calls "migraine aurora" which he says he gets from time to time but has increased in frequency lately (he denies migraine pain "I just get the aurora")... ~  11/12: Neuro eval from DrWong: 1) MRI/ MRA of head & neck> tortuous vessels w/ diminutive left vertebral vs chr occlusion; ectasia of basilar art & both carotid siphons, no stenoses or major branch occlusions.Marland KitchenMarland Kitchen 2) EEG suggested an area of neuronal dysfunction in left temporal region w/ delta & theta activity... DrWong speculated that he might have had a migraine HA & rec incr ASA to 325mg /d...  INSOMNIA, CHRONIC (ICD-307.42) - uses AMBIEN 10mg  Qhs...  MALIGNANT  MELANOMA, HX OF (ICD-V10.82) - s/p surgery of mediastinal melanoma 1980's... no known recurrence... ~  3/12: he tells me that DrDJones removed a squamous cell skin cancer... ~  10/12: DrJones removed several skin cancers- one right cheek, one right forearm & referred pt for Moh's...   Past Surgical History  Procedure Date  . Bilateral inguinal hernia repair   . Umbilical hernia repair   . Median sternotomy for mediastinal melanoma 1980s    Outpatient Encounter Prescriptions as of 07/19/2012  Medication Sig Dispense Refill  . alum & mag hydroxide-simeth (MAALOX/MYLANTA) 200-200-20 MG/5ML suspension Take 30 mLs by mouth every 6 (six) hours as needed.  355 mL    . Alum & Mag Hydroxide-Simeth (MAGIC MOUTHWASH) SOLN Take 5 mLs by mouth 4 (four) times daily.      Marland Kitchen amoxicillin-clavulanate (AUGMENTIN) 875-125 MG per tablet Take 1 tablet by mouth 2 (two) times daily. X 4 weeks, stop date Jan 6th.  60 tablet  0  . AVODART 0.5 MG capsule TAKE 1 CAPSULE EVERY DAY  30 capsule  10  . etodolac (LODINE XL) 400 MG 24 hr tablet TAKE 1 TABLET (400 MG TOTAL) BY MOUTH DAILY.  60 tablet  5  . feeding supplement (ENSURE COMPLETE) LIQD Take 237 mLs by mouth 2 (two) times daily between meals. VANILLA flavor      . feeding supplement (RESOURCE BREEZE) LIQD Take 1 Container by mouth 2 (two) times daily.      Marland Kitchen guaiFENesin-dextromethorphan (ROBITUSSIN DM) 100-10 MG/5ML syrup Take 5 mLs by mouth every 4 (four) hours as needed  for cough.  118 mL    . ipratropium (ATROVENT) 0.02 % nebulizer solution Take 2.5 mLs (0.5 mg total) by nebulization 3 (three) times daily.  75 mL    . levalbuterol (XOPENEX) 0.63 MG/3ML nebulizer solution Take 3 mLs (0.63 mg total) by nebulization 3 (three) times daily.  3 mL    . linezolid (ZYVOX) 600 MG tablet Take 1 tablet (600 mg total) by mouth every 12 (twelve) hours. For 4 weeks, stop date Jan 6th, 2014  60 tablet  0  . oxyCODONE (OXY IR/ROXICODONE) 5 MG immediate release tablet Take 1  tablet (5 mg total) by mouth every 4 (four) hours as needed for pain (May use as cough suppressant at HS PRN).  30 tablet  0  . pantoprazole (PROTONIX) 40 MG tablet Take 1 tablet (40 mg total) by mouth 2 (two) times daily before a meal.      . Tamsulosin HCl (FLOMAX) 0.4 MG CAPS Take 0.4 mg by mouth 2 (two) times daily.       . trospium (SANCTURA) 20 MG tablet Take 1 tablet (20 mg total) by mouth 2 (two) times daily.  60 tablet  0  . aspirin 325 MG EC tablet Take 325 mg by mouth daily.        . polyethylene glycol (MIRALAX / GLYCOLAX) packet Take 17 g by mouth daily as needed (constipation).  14 each    . polyvinyl alcohol (LIQUIFILM TEARS) 1.4 % ophthalmic solution Place 2 drops into both eyes as needed.  15 mL    . [DISCONTINUED] fluconazole (DIFLUCAN) 100 MG tablet Take 1 tablet (100 mg total) by mouth daily. X 10 days for oral thrush      . [DISCONTINUED] food thickener (THICK IT) POWD As needed with meals, NECTAR THICK      . [DISCONTINUED] levalbuterol (XOPENEX) 0.63 MG/3ML nebulizer solution Take 3 mLs (0.63 mg total) by nebulization every 2 (two) hours as needed for wheezing or shortness of breath.  3 mL      Allergies  Allergen Reactions  . Ivp Dye (Iodinated Diagnostic Agents)      Current Medications, Allergies, Past Medical History, Past Surgical History, Family History, and Social History were reviewed in [employer] record.    Review of Systems         Constitutional:   No  weight loss, night sweats,  Fevers, chills, + fatigue, or  lassitude.  HEENT:   No headaches,  Difficulty swallowing,  Tooth/dental problems, or  Sore throat,                No sneezing, itching, ear ache, nasal congestion, post nasal drip,   CV:  No chest pain,  Orthopnea, PND, swelling in lower extremities, anasarca, dizziness, palpitations, syncope.   GI  No heartburn, indigestion, abdominal pain, nausea, vomiting, diarrhea, change in bowel habits, loss of appetite, bloody  stools.   Resp:   No coughing up of blood.   No chest wall deformity  Skin: no rash or lesions.  GU: no dysuria, change in color of urine, no urgency or frequency.  No flank pain, no hematuria   MS:  No joint pain or swelling.  No decreased range of motion.  No back pain.  Psych:  No change in mood or affect. No depression or anxiety.  No memory loss.       Objective:   Physical Exam     WD, WN, 78 y/o WM in NAD...chronically ill appearing in [facility]  GENERAL:  Alert & oriented; pleasant & cooperative... HEENT:  Alvin/AT,  , EACs-clear, TMs-wnl, NOSE-clear, THROAT-clear & wnl. NECK:  Supple w/ fairROM; no JVD; normal carotid impulses w/o bruits; no thyromegaly or nodules palpated; no lymphadenopathy. CHEST:  Decr BS in bases , clear without wheezes/ rales/ or rhonchi, s/p median sternotomy. HEART:  Sl irreg  2/6 sys murmur, without rubs of gallops appreciated... ABDOMEN:  Soft & nontender; normal bowel sounds; no organomegaly or masses detected. EXT: without deformities, mild arthritic changes, +ven insuffic - 2+ edema... NEURO:   No focal neuro deficit found... DERM:  no rash, no skin lesions noted.Marland Kitchen   CXR :  Increased left much larger than right pleural effusions. . Extensive left side airspace disease has worsened. Right basilar airspace opacity is unchanged.     Assessment & Plan:

## 2012-07-19 NOTE — ED Provider Notes (Signed)
History    78 year old male referred to ER for further evaluation. Patient with lengthy recent hospital admission. Has necrotizing pneumonia. Cultures grew out MRSA. Discharged on Augmentin and Zyvox. Seen in office today. Chest x-ray showed worsening pneumonia and worsening pleural effusions. Patient reports compliance with his medications. He does feel short of breath, which is chronic for currently worse than his usual baseline. He is chronically on 3 L nasal cannula. Left-sided chest pain which has been ongoing for the past several days. Constant. Does not radiate. Has past hx COPD, CAD & mild AS followed by [facility], Hx AFlutter s/p ablation in '03. Most recent catheterization appears to be in 2009 which showed nonobstructive coronary artery disease.  CSN: [id number]  Arrival date & time 07/19/12  1701   First MD Initiated Contact with Patient 07/19/12 1713      Chief Complaint  Patient presents with  . Shortness of Breath    (Consider location/radiation/quality/duration/timing/severity/associated sxs/prior treatment) HPI  Past Medical History  Diagnosis Date  . Allergic rhinitis   . COPD (chronic obstructive pulmonary disease)   . Disorders of diaphragm   . Acute bronchitis   . Hypertension   . CAD (coronary artery disease)     Catheterization 2009, mild coronary disease (after abnormal nuclear study,With hypotensive response to exercise, 2009)  . RBBB   . Atrial flutter     Ablated in the past, no recurrence  . Venous insufficiency   . Edema   . Dyslipidemia     Patient has an and to use statin  . GERD (gastroesophageal reflux disease)   . Diverticulosis of colon   . Hypertrophy of prostate with urinary obstruction and other lower urinary tract symptoms (LUTS)   . Increased prostate specific antigen (PSA) velocity   . DJD (degenerative joint disease)   . Chronic insomnia   . Malignant melanoma     Removed from mediastinum via thoracotomy July, 2010  . Ejection fraction      EF 60%,Echo, 2009,  /  EF 55-60%, echo, February, 2013  . Aortic stenosis     Mild, echo and catheterization, 2009 /  Mild, echo, February, 2013  . Mitral stenosis     Very mild, echo, from mitral annular calcification  . Shortness of breath     Episodes of feeling shortness of breath with some mild dizziness with mild exertion., February, 2013  . Tricuspid regurgitation     Moderate, echo, February, 2013, 56 mmHg  . Pulmonary hypertension     56 mmHg, echo, February, 2013    Past Surgical History  Procedure Date  . Bilateral inguinal hernia repair   . Umbilical hernia repair   . 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  History  Substance Use Topics  . Smoking status: Former Smoker    Quit date: 08/03/1960  . Smokeless tobacco: Never Used  . Alcohol Use: Yes     Comment: social use      Review of Systems  All systems reviewed and negative, other than as noted in HPI.   Allergies  Ivp dye  Home Medications   Current Outpatient Rx  Name  Route  Sig  Dispense  Refill  . ALUM & MAG HYDROXIDE-SIMETH 200-200-20 MG/5ML PO SUSP   Oral   Take 30 mLs by mouth every 6 (six) hours as needed. Stomach discomfort.         Marland Kitchen MAGIC MOUTHWASH   Oral   Take 5 mLs  by mouth 4 (four) times daily. Swish and swallow.         . AMOXICILLIN-POT CLAVULANATE 875-125 MG PO TABS   Oral   Take 1 tablet by mouth 2 (two) times daily. X 4 weeks, stop date Jan 6th.   60 tablet   0   . AVODART 0.5 MG PO CAPS      TAKE 1 CAPSULE EVERY DAY   30 capsule   10   . ETODOLAC ER 400 MG PO TB24      TAKE 1 TABLET (400 MG TOTAL) BY MOUTH DAILY.   60 tablet   5   . GUAIFENESIN-DM 100-10 MG/5ML PO SYRP   Oral   Take 5 mLs by mouth every 4 (four) hours as needed for cough.   118 mL      . IPRATROPIUM BROMIDE 0.02 % IN SOLN   Nebulization   Take 2.5 mLs (0.5 mg total) by nebulization 3 (three) times daily.   75 mL      . LEVALBUTEROL  HCL 0.63 MG/3ML IN NEBU   Nebulization   Take 3 mLs (0.63 mg total) by nebulization 3 (three) times daily.   3 mL      . LINEZOLID 600 MG PO TABS   Oral   Take 1 tablet (600 mg total) by mouth every 12 (twelve) hours. For 4 weeks, stop date Jan 6th, 2014   60 tablet   0   . OXYCODONE HCL 5 MG PO TABS   Oral   Take 5 mg by mouth every 4 (four) hours as needed.         Marland Kitchen PANTOPRAZOLE SODIUM 40 MG PO TBEC   Oral   Take 1 tablet (40 mg total) by mouth 2 (two) times daily before a meal.         . POLYETHYLENE GLYCOL 3350 PO PACK   Oral   Take 17 g by mouth daily as needed (constipation).   14 each      . TAMSULOSIN HCL 0.4 MG PO CAPS   Oral   Take 0.4 mg by mouth 2 (two) times daily.          . TROSPIUM CHLORIDE 20 MG PO TABS   Oral   Take 1 tablet (20 mg total) by mouth 2 (two) times daily.   60 tablet   0     BP 107/70  Pulse 112  Resp 29  Ht 6' 0.05" (1.83 m)  Wt 186 lb 8.2 oz (84.6 kg)  BMI 25.26 kg/m2  SpO2 100%  Physical Exam  Nursing note and vitals reviewed. Constitutional: He appears well-developed and well-nourished.       Laying in bed. Tired appearing.  HENT:  Head: Normocephalic and atraumatic.  Eyes: Conjunctivae normal are normal. Pupils are equal, round, and reactive to light. Right eye exhibits no discharge. Left eye exhibits no discharge.  Neck: Normal range of motion. Neck supple.  Cardiovascular: Normal heart sounds.  Exam reveals no gallop and no friction rub.   No murmur heard.      Irregularly irregular rhythm. Mild tachycardia.  Pulmonary/Chest: Breath sounds normal.       Mild tachypnea. Decreased sounds left as compared to the right. Faint expiratory wheezing.  Abdominal: Soft. He exhibits no distension. There is no tenderness.  Musculoskeletal: He exhibits no edema and no tenderness.       Symmetric, pitting m edema. No calf tenderness. No palpable cords. Negative Homans.  Neurological: He is alert.  Skin: Skin is warm and  dry. He is not diaphoretic.  Psychiatric: His behavior is normal. Thought content normal.    ED Course  Procedures (including critical care time)  Labs Reviewed  PRO B NATRIURETIC PEPTIDE - Abnormal; Notable for the following:    Pro B Natriuretic peptide (BNP) 2817.0 (*)     All other components within normal limits  CBC - Abnormal; Notable for the following:    WBC 11.2 (*)     RBC 3.62 (*)     Hemoglobin 10.7 (*)     HCT 32.0 (*)     Platelets 138 (*)     All other components within normal limits  BASIC METABOLIC PANEL - Abnormal; Notable for the following:    Sodium 132 (*)     Glucose, Bld 112 (*)     BUN 52 (*)     GFR calc non Af Amer 60 (*)     GFR calc Af Amer 70 (*)     All other components within normal limits  TROPONIN I   Dg Chest 2 View  07/19/2012  *RADIOLOGY REPORT*  Clinical Data: Pneumonia.  Cough, shortness of breath and chest pain.  CHEST - 2 VIEW  Comparison: Plain films of the chest 07/06/2012.  CT chest 07/02/2012.  Findings: There has been marked increase in the patient's left pleural effusion.  Smaller right pleural effusion has also increased.  Extensive airspace disease in the left chest has worsened.  Right basilar airspace opacity does not appear notably changed.  Cardiac silhouette is obscured.  The patient is status post CABG.  IMPRESSION:  1.  Increased left much larger than right pleural effusions. 2.  Extensive left side airspace disease has worsened.  Right basilar airspace opacity is unchanged.   Original Report Authenticated By: Holley Dexter, M.D.    EKG:  Rhythm: Atrial fibrillation Rate: 107 Axis: Left Intervals: Bifascicular block. ST segments: Nonspecific ST changes.   1. Necrotizing pneumonia, community acquired MRSA       MDM  78 year old male with worsening pneumonia despite outpatient antibiotic treatment. Will require readmission unfortunately. Antibiotics ordered. May potentially need thoracentesis. Suspect chest pain  related to pulmonary process. Atypical for ACS. EKG has nonspecific changes. Troponin is normal.        Raeford Razor, MD 07/19/12 [phone]

## 2012-07-19 NOTE — Progress Notes (Signed)
CSW confirmed with patient family that pt is a resident at [facility] at [facility]. CSW will complete assessment at later time. CSW attempted to assess patient however,pt is currently in procedure with nurse and tech.   Catha Gosselin, LCSWA  [phone] .07/19/2012 1826pm

## 2012-07-19 NOTE — ED Notes (Signed)
Per EMS- Pt from [facility] facility. Pt sent over from dr office, being treated with antibiotics for MRSA pneumonia. + sputum culture. Chest x-ray pneumonia worsening.  Pt c/o of continuing SOB and edema. Pain along left axillary side. O2 dependent at 3L.

## 2012-07-20 ENCOUNTER — Inpatient Hospital Stay (HOSPITAL_COMMUNITY): Payer: Medicare Other

## 2012-07-20 DIAGNOSIS — R600 Localized edema: Secondary | ICD-10-CM | POA: Diagnosis present

## 2012-07-20 DIAGNOSIS — J869 Pyothorax without fistula: Secondary | ICD-10-CM

## 2012-07-20 DIAGNOSIS — J852 Abscess of lung without pneumonia: Principal | ICD-10-CM

## 2012-07-20 DIAGNOSIS — R609 Edema, unspecified: Secondary | ICD-10-CM

## 2012-07-20 DIAGNOSIS — R0602 Shortness of breath: Secondary | ICD-10-CM

## 2012-07-20 DIAGNOSIS — I10 Essential (primary) hypertension: Secondary | ICD-10-CM

## 2012-07-20 DIAGNOSIS — J449 Chronic obstructive pulmonary disease, unspecified: Secondary | ICD-10-CM

## 2012-07-20 DIAGNOSIS — I4891 Unspecified atrial fibrillation: Secondary | ICD-10-CM | POA: Diagnosis present

## 2012-07-20 LAB — BLOOD GAS, ARTERIAL
Acid-Base Excess: 1 mmol/L (ref 0.0–2.0)
Bicarbonate: 25.4 mEq/L — ABNORMAL HIGH (ref 20.0–24.0)
Drawn by: 274071
O2 Content: 2 L/min
O2 Saturation: 97.2 %
Patient temperature: 98.6
TCO2: 26.7 mmol/L (ref 0–100)
pCO2 arterial: 42.4 mmHg (ref 35.0–45.0)
pH, Arterial: 7.394 (ref 7.350–7.450)
pO2, Arterial: 89.6 mmHg (ref 80.0–100.0)

## 2012-07-20 LAB — PROTIME-INR
INR: 1.15 (ref 0.00–1.49)
Prothrombin Time: 14.5 seconds (ref 11.6–15.2)

## 2012-07-20 LAB — CBC
HCT: 33.2 % — ABNORMAL LOW (ref 39.0–52.0)
HCT: 32.1 % — ABNORMAL LOW (ref 39.0–52.0)
Hemoglobin: 10.4 g/dL — ABNORMAL LOW (ref 13.0–17.0)
MCH: 28.9 pg (ref 26.0–34.0)
MCH: 29.5 pg (ref 26.0–34.0)
MCHC: 32.4 g/dL (ref 30.0–36.0)
MCV: 89.7 fL (ref 78.0–100.0)
MCV: 91.2 fL (ref 78.0–100.0)
Platelets: 120 10*3/uL — ABNORMAL LOW (ref 150–400)
Platelets: 132 10*3/uL — ABNORMAL LOW (ref 150–400)
RBC: 3.52 MIL/uL — ABNORMAL LOW (ref 4.22–5.81)
RDW: 13.6 % (ref 11.5–15.5)
RDW: 13.4 % (ref 11.5–15.5)
WBC: 7.9 10*3/uL (ref 4.0–10.5)
WBC: 8 10*3/uL (ref 4.0–10.5)

## 2012-07-20 LAB — AMYLASE, BODY FLUID

## 2012-07-20 LAB — BASIC METABOLIC PANEL
BUN: 46 mg/dL — ABNORMAL HIGH (ref 6–23)
CO2: 27 mEq/L (ref 19–32)
Calcium: 8.5 mg/dL (ref 8.4–10.5)
Chloride: 96 mEq/L (ref 96–112)
Creatinine, Ser: 1.04 mg/dL (ref 0.50–1.35)
Glucose, Bld: 100 mg/dL — ABNORMAL HIGH (ref 70–99)

## 2012-07-20 LAB — BODY FLUID CELL COUNT WITH DIFFERENTIAL
Eos, Fluid: 2 %
Monocyte-Macrophage-Serous Fluid: 3 % — ABNORMAL LOW (ref 50–90)
Neutrophil Count, Fluid: 49 % — ABNORMAL HIGH (ref 0–25)
Total Nucleated Cell Count, Fluid: 1080 cu mm — ABNORMAL HIGH (ref 0–1000)

## 2012-07-20 LAB — COMPREHENSIVE METABOLIC PANEL
ALT: 17 U/L (ref 0–53)
AST: 16 U/L (ref 0–37)
Albumin: 1.9 g/dL — ABNORMAL LOW (ref 3.5–5.2)
Alkaline Phosphatase: 117 U/L (ref 39–117)
BUN: 44 mg/dL — ABNORMAL HIGH (ref 6–23)
CO2: 26 mEq/L (ref 19–32)
Calcium: 8.8 mg/dL (ref 8.4–10.5)
Chloride: 98 mEq/L (ref 96–112)
Creatinine, Ser: 0.99 mg/dL (ref 0.50–1.35)
GFR calc Af Amer: 89 mL/min — ABNORMAL LOW (ref >90)
GFR calc non Af Amer: 76 mL/min — ABNORMAL LOW (ref >90)
Glucose, Bld: 153 mg/dL — ABNORMAL HIGH (ref 70–99)
Potassium: 4.5 mEq/L (ref 3.5–5.1)
Sodium: 132 mEq/L — ABNORMAL LOW (ref 135–145)
Total Bilirubin: 1 mg/dL (ref 0.3–1.2)
Total Protein: 5.6 g/dL — ABNORMAL LOW (ref 6.0–8.3)

## 2012-07-20 LAB — APTT: aPTT: 36 seconds (ref 24–37)

## 2012-07-20 LAB — LACTATE DEHYDROGENASE, PLEURAL OR PERITONEAL FLUID: LD, Fluid: 320 U/L — ABNORMAL HIGH (ref 3–23)

## 2012-07-20 LAB — ABO/RH: ABO/RH(D): O POS

## 2012-07-20 LAB — MRSA PCR SCREENING: MRSA by PCR: POSITIVE — AB

## 2012-07-20 LAB — PH, BODY FLUID: pH, Fluid: 8.5

## 2012-07-20 MED ORDER — SODIUM CHLORIDE 0.9 % IV BOLUS (SEPSIS)
500.0000 mL | Freq: Once | INTRAVENOUS | Status: AC
  Administered 2012-07-20: 500 mL via INTRAVENOUS

## 2012-07-20 MED ORDER — DEXTROSE 5 % IV SOLN
1.0000 g | Freq: Two times a day (BID) | INTRAVENOUS | Status: DC
  Administered 2012-07-20 – 2012-07-22 (×4): 1 g via INTRAVENOUS
  Filled 2012-07-20 (×8): qty 1

## 2012-07-20 MED ORDER — CHLORHEXIDINE GLUCONATE CLOTH 2 % EX PADS
6.0000 | MEDICATED_PAD | Freq: Every day | CUTANEOUS | Status: DC
  Administered 2012-07-21: 6 via TOPICAL

## 2012-07-20 MED ORDER — METOPROLOL TARTRATE 50 MG PO TABS
50.0000 mg | ORAL_TABLET | Freq: Two times a day (BID) | ORAL | Status: DC
  Administered 2012-07-20 (×2): 50 mg via ORAL
  Filled 2012-07-20 (×5): qty 1

## 2012-07-20 MED ORDER — MUPIROCIN 2 % EX OINT
1.0000 "application " | TOPICAL_OINTMENT | Freq: Two times a day (BID) | CUTANEOUS | Status: AC
  Administered 2012-07-21 – 2012-07-25 (×10): 1 via NASAL
  Filled 2012-07-20 (×3): qty 22

## 2012-07-20 MED ORDER — LEVALBUTEROL HCL 0.63 MG/3ML IN NEBU
0.6300 mg | INHALATION_SOLUTION | Freq: Four times a day (QID) | RESPIRATORY_TRACT | Status: DC | PRN
  Administered 2012-08-04: 0.63 mg via RESPIRATORY_TRACT
  Filled 2012-07-20: qty 3

## 2012-07-20 MED ORDER — VANCOMYCIN HCL IN DEXTROSE 1-5 GM/200ML-% IV SOLN
1000.0000 mg | Freq: Two times a day (BID) | INTRAVENOUS | Status: DC
  Administered 2012-07-20 – 2012-07-25 (×9): 1000 mg via INTRAVENOUS
  Filled 2012-07-20 (×13): qty 200

## 2012-07-20 MED ORDER — LEVOFLOXACIN IN D5W 750 MG/150ML IV SOLN
750.0000 mg | INTRAVENOUS | Status: AC
  Administered 2012-07-20 – 2012-07-21 (×2): 750 mg via INTRAVENOUS
  Filled 2012-07-20 (×4): qty 150

## 2012-07-20 MED ORDER — SODIUM CHLORIDE 0.9 % IV SOLN
INTRAVENOUS | Status: DC
  Administered 2012-07-20: 03:00:00 via INTRAVENOUS

## 2012-07-20 MED ORDER — SACCHAROMYCES BOULARDII 250 MG PO CAPS
250.0000 mg | ORAL_CAPSULE | Freq: Two times a day (BID) | ORAL | Status: DC
  Administered 2012-07-20 (×2): 250 mg via ORAL
  Filled 2012-07-20 (×6): qty 1

## 2012-07-20 MED ORDER — GUAIFENESIN ER 600 MG PO TB12
600.0000 mg | ORAL_TABLET | Freq: Two times a day (BID) | ORAL | Status: DC
  Administered 2012-07-20 (×2): 600 mg via ORAL
  Filled 2012-07-20 (×6): qty 1

## 2012-07-20 NOTE — Progress Notes (Addendum)
[facility] PROGRESS NOTE  Christian [name] ZOX:[id number] DOB: [birth year] DOA: 07/19/2012  PCP: Michele Mcalpine, MD  Brief HPI: This is a previously healthy 78 year old gentleman who was admitted with COPD exacerbation diagnosed with necrotizing community-acquired MRSA pneumonia. The patient aspirated during his inpatient stay, swallow study showed silent aspiration. He had a short stay in the ICU. The patient was discharged approximately 1 week ago to [facility]. Since discharge he has received his antibiotics, he has remained short of breath, he was oxygen dependent and hypoxic at the time of discharge. His daughter noted he was developing some lower extremity edema. On the day of admission he had a doctor's appointment, chest x-ray revealed possible worsening pneumonia and a pleural effusion. He was sent to the ER to be evaluated. History provided by patient, a granddaughter who is a Engineer, civil (consulting) here at [facility] and his daughters at the bedside.  Past medical history:  Past Medical History  Diagnosis Date  . Allergic rhinitis   . COPD (chronic obstructive pulmonary disease)   . Disorders of diaphragm   . Acute bronchitis   . Hypertension   . CAD (coronary artery disease)     Catheterization 2009, mild coronary disease (after abnormal nuclear study,With hypotensive response to exercise, 2009)  . RBBB   . Atrial flutter     Ablated in the past, no recurrence  . Venous insufficiency   . Edema   . Dyslipidemia     Patient has an and to use statin  . GERD (gastroesophageal reflux disease)   . Diverticulosis of colon   . Hypertrophy of prostate with urinary obstruction and other lower urinary tract symptoms (LUTS)   . Increased prostate specific antigen (PSA) velocity   . DJD (degenerative joint disease)   . Chronic insomnia   . Malignant melanoma     Removed from mediastinum via thoracotomy July, 2010  . Ejection fraction     EF 60%,Echo, 2009,  /  EF 55-60%, echo,  February, 2013  . Aortic stenosis     Mild, echo and catheterization, 2009 /  Mild, echo, February, 2013  . Mitral stenosis     Very mild, echo, from mitral annular calcification  . Shortness of breath     Episodes of feeling shortness of breath with some mild dizziness with mild exertion., February, 2013  . Tricuspid regurgitation     Moderate, echo, February, 2013, 56 mmHg  . Pulmonary hypertension     56 mmHg, echo, February, 2013  . Pneumonia     Consultants: None yet  Procedures: Scheduled for thoracentesis 12/18 by IR  Antibiotics: Cefepime 12/17 Levaquin 12/17 Vancomycin 12/17  Subjective: Patient feels short of breath at times. Also has left chest pain. Cough with brownish expectoration.  Objective: Vital Signs  Filed Vitals:   07/20/12 0000 07/20/12 0350 07/20/12 0400 07/20/12 0800  BP:  105/65    Pulse: 111 112  96  Temp: 98.3 F (36.8 C)  98.6 F (37 C)   TempSrc: Oral  Oral   Resp: 23 25  20   Height:      Weight:      SpO2: 100% 99%  98%    Intake/Output Summary (Last 24 hours) at 07/20/12 0856 Last data filed at 07/20/12 0600  Gross per 24 hour  Intake    547 ml  Output    625 ml  Net    -78 ml   Filed Weights   07/19/12 1717  Weight: 84.6 kg (186 lb  8.2 oz)    Intake/Output from previous day: 12/17 0701 - 12/18 0700 In: 547 [I.V.:395; IV Piggyback:152] Out: 625 [Urine:625]  General appearance: alert, cooperative, appears stated age and no distress Head: Normocephalic, without obvious abnormality, atraumatic Resp: no air entry on left. Crackles at bases. No wheezing Cardio: S1-S2 is irregularly irregular. No S3, S4. Systolic murmur is appreciated. GI: soft, non-tender; bowel sounds normal; no masses,  no organomegaly Extremities: edema 2-3+ bilateral LE, pitting Pulses: 2+ and symmetric Skin: Skin color, texture, turgor normal. No rashes or lesions Lymph nodes: Cervical, supraclavicular, and axillary nodes normal. Neurologic: Alert and  oriented x3. No focal neurological deficits are present.  Lab Results:  Basic Metabolic Panel:  Lab 07/20/12 1610 07/19/12 1805  NA 130* 132*  K 4.3 4.7  CL 96 98  CO2 27 27  GLUCOSE 100* 112*  BUN 46* 52*  CREATININE 1.04 1.13  CALCIUM 8.5 8.8  MG -- --  PHOS -- --   Liver Function Tests: No results found for this basename: AST:5,ALT:5,ALKPHOS:5,BILITOT:5,PROT:5,ALBUMIN:5 in the last 168 hours No results found for this basename: LIPASE:5,AMYLASE:5 in the last 168 hours No results found for this basename: AMMONIA:5 in the last 168 hours CBC:  Lab 07/20/12 0340 07/19/12 1805  WBC 7.9 11.2*  NEUTROABS -- --  HGB 10.7* 10.7*  HCT 33.2* 32.0*  MCV 89.7 88.4  PLT 120* 138*   Cardiac Enzymes:  Lab 07/19/12 1805  CKTOTAL --  CKMB --  CKMBINDEX --  TROPONINI <0.30   BNP (last 3 results)  Basename 07/19/12 1805  PROBNP 2817.0*     Studies/Results: Dg Chest 2 View  07/19/2012  *RADIOLOGY REPORT*  Clinical Data: Pneumonia.  Cough, shortness of breath and chest pain.  CHEST - 2 VIEW  Comparison: Plain films of the chest 07/06/2012.  CT chest 07/02/2012.  Findings: There has been marked increase in the patient's left pleural effusion.  Smaller right pleural effusion has also increased.  Extensive airspace disease in the left chest has worsened.  Right basilar airspace opacity does not appear notably changed.  Cardiac silhouette is obscured.  The patient is status post CABG.  IMPRESSION:  1.  Increased left much larger than right pleural effusions. 2.  Extensive left side airspace disease has worsened.  Right basilar airspace opacity is unchanged.   Original Report Authenticated By: Holley Dexter, M.D.     Medications:  Scheduled:   . ceFEPime (MAXIPIME) IV  1 g Intravenous Q12H  . dutasteride  0.5 mg Oral Daily  . enoxaparin (LOVENOX) injection  30 mg Subcutaneous QHS  . furosemide  20 mg Intravenous Q12H  . guaiFENesin  600 mg Oral BID  . levofloxacin (LEVAQUIN) IV   750 mg Intravenous Q24H  . metoprolol tartrate  50 mg Oral BID  . pantoprazole  40 mg Oral BID AC  . Tamsulosin HCl  0.4 mg Oral BID  . vancomycin  1,000 mg Intravenous Q12H   Continuous:   . sodium chloride 10 mL/hr at 07/20/12 0304   RUE:[id number], acetaminophen, alum & mag hydroxide-simeth, guaiFENesin-dextromethorphan, levalbuterol, oxyCODONE, polyethylene glycol, sodium chloride  Assessment/Plan:  Active Problems:  ESSENTIAL HYPERTENSION  COPD (chronic obstructive pulmonary disease)  CAD (coronary artery disease)  Necrotizing pneumonia, community acquired MRSA  Dysphagia, pharyngeal  Pleural effusion    Left Pleural effusion/Necrotizing pneumonia, community acquired MRSA  Patient is scheduled for thoracentesis today. Will follow up on fluid analysis. May need to consult ID and Pulmonology but will decide after culture reports are available.  Continue Oxygen. Continue Cefepime/Vanc/Leevaquin.  History of  COPD (chronic obstructive pulmonary disease)  Continue to monitor. Nebs as needed  Atrial Fibrillation Has a history of a flutter but EKG is suggestive of afib. Patient was on Atenolol till last admission when it was discontinued for unknown reasons. Will start metoprolol. Not on anti-coagulation. Is followed by [facility] cardiology (Dr. Myrtis Ser). Continue to monitor HR.  Pedal edema with fluid overload  IV Lasix. Check venous dopplers due to recent hospitalization.  History of ESSENTIAL HYPERTENSION Continue to monitor.  History of CAD (coronary artery disease)  Stable.  History of melanoma  Stable.  Dysphagia, pharyngeal  Mechanical soft nectar thick diet   Code Status Full Code  DVT Prophylaxis Enoxaparin  Family Communication: No family at bedside. Discussed with patient.  Disposition Plan: Will likely need to return to SNF when improved.    LOS: 1 day   [facility]  [facility] Pager [phone] 07/20/2012, 8:56 AM  If 8PM-8AM, please  contact night-coverage at [email or web address], password [email or web address]   Was notified that multiple loculations were noted when thoracentesis was attempted under [place]. In view of this patient will likely require VATS. Have discussed with patient's granddaughter and consulted Pulmonology. Pulmonology has spoken to Dr. Tyrone Sage who will evaluate patient and requested transfer to [facility]. I spoke to Brett Canales Minor and he informed me that patient will be transferred to [facility] under PCCM service. [facility] will sign off and will be available as needed.  Tolulope Pinkett 4:48 PM

## 2012-07-20 NOTE — Consult Note (Signed)
PULMONARY  / CRITICAL CARE MEDICINE  Name: Christian [name] MRN: [id number] DOB: 04-12-1932    LOS: 1  REFERRING MD :  [facility] PCP: Kriste Basque CHIEF COMPLAINT:  Loculated left pleural effusion/empyema    BRIEF PATIENT DESCRIPTION:  78 yo male, PCP of [name], seen in office 12-17 with increased effusion. He was discharged from [facility] 11/25 to 12-10 to [facility] after treatment for Mrsa necrotizing pna.  He was evaluated in Dr. Jodelle Green office by Andrey Campanile ANP 12-17 and found to have increased left effusion. Admitted by [facility] and thoracentesis was performed but effusion is loculated on [place] - Fluid was cloudy and probably a loculated empyema. PCCM asked to evaluate and help with care 12-18.    Note he has known aspiration and is on swallow precautions. 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  LINES / TUBES:   CULTURES: 12-18 thora fluid>>cloudy, 45% PMNs 12-18 bcx2>> 12-18 uc>>  ANTIBIOTICS: 12-18 levaquin>> 12-18 vanc>> 12-18 maxipime>>  SIGNIFICANT EVENTS:  12-18 thoracentesis  LEVEL OF CARE:  sdu PRIMARY SERVICE:  [facility] CONSULTANTS:  PCCM CODE STATUS DIET:  Dys 3 DVT Px: LMWH GI Px:  PPI  HISTORY OF PRESENT ILLNESS:  78 yo male, PCP of [name], seen in office 12-17 with increased effusion. He was discharged from [facility] 12-10 to [facility] after treatment for Mrsa necrotizing pna.  He was evaluated in Dr. Jodelle Green office by Andrey Campanile ANP 12-17 and found to have increased left effusion.  PAST MEDICAL HISTORY :  Past Medical History  Diagnosis Date  . Allergic rhinitis   . COPD (chronic obstructive pulmonary disease)   . Disorders of diaphragm   . Acute bronchitis   . Hypertension   . CAD (coronary artery  disease)     Catheterization 2009, mild coronary disease (after abnormal nuclear study,With hypotensive response to exercise, 2009)  . RBBB   . Atrial flutter     Ablated in the past, no recurrence  . Venous insufficiency   . Edema   . Dyslipidemia     Patient has an and to use statin  . GERD (gastroesophageal reflux disease)   . Diverticulosis of colon   . Hypertrophy of prostate with urinary obstruction and other lower urinary tract symptoms (LUTS)   . Increased prostate specific antigen (PSA) velocity   . DJD (degenerative joint disease)   . Chronic insomnia   . Malignant melanoma     Removed from mediastinum via thoracotomy July, 2010  . Ejection fraction     EF 60%,Echo, 2009,  /  EF 55-60%, echo, February, 2013  . Aortic stenosis     Mild, echo and catheterization, 2009 /  Mild, echo, February, 2013  . Mitral stenosis     Very mild, echo, from mitral annular calcification  . Shortness of breath     Episodes of feeling shortness of breath with some mild dizziness with mild exertion., February, 2013  . Tricuspid regurgitation     Moderate, echo, February, 2013, 56 mmHg  . Pulmonary hypertension     56 mmHg, echo, February, 2013  . Pneumonia    Past Surgical History  Procedure Date  . Bilateral inguinal hernia repair   . Umbilical hernia repair   .  Median sternotomy for mediastinal melanoma 1980s   Prior to Admission medications   Medication Sig Start Date End Date Taking? Authorizing Provider  alum & mag hydroxide-simeth (MAALOX/MYLANTA) 200-200-20 MG/5ML suspension Take 30 mLs by mouth every 6 (six) hours as needed. Stomach discomfort. 07/12/12  Yes Ripudeep Jenna Luo, MD  Alum & Mag Hydroxide-Simeth (MAGIC MOUTHWASH) SOLN Take 5 mLs by mouth 4 (four) times daily. Swish and swallow. 07/12/12  Yes Ripudeep Jenna Luo, MD  amoxicillin-clavulanate (AUGMENTIN) 875-125 MG per tablet Take 1 tablet by mouth 2 (two) times daily. X 4 weeks, stop date Jan 6th. 07/12/12  Yes Ripudeep Jenna Luo,  MD  AVODART 0.5 MG capsule TAKE 1 CAPSULE EVERY DAY 07/15/11  Yes Michele Mcalpine, MD  etodolac (LODINE XL) 400 MG 24 hr tablet TAKE 1 TABLET (400 MG TOTAL) BY MOUTH DAILY. 04/22/12  Yes Michele Mcalpine, MD  guaiFENesin-dextromethorphan (ROBITUSSIN DM) 100-10 MG/5ML syrup Take 5 mLs by mouth every 4 (four) hours as needed for cough. 07/12/12  Yes Ripudeep K Rai, MD  ipratropium (ATROVENT) 0.02 % nebulizer solution Take 2.5 mLs (0.5 mg total) by nebulization 3 (three) times daily. 07/12/12  Yes Ripudeep Jenna Luo, MD  levalbuterol (XOPENEX) 0.63 MG/3ML nebulizer solution Take 3 mLs (0.63 mg total) by nebulization 3 (three) times daily. 07/12/12  Yes Ripudeep Jenna Luo, MD  linezolid (ZYVOX) 600 MG tablet Take 1 tablet (600 mg total) by mouth every 12 (twelve) hours. For 4 weeks, stop date Jan 6th, 2014 07/12/12  Yes Ripudeep Jenna Luo, MD  oxyCODONE (OXY IR/ROXICODONE) 5 MG immediate release tablet Take 5 mg by mouth every 4 (four) hours as needed. 07/12/12  Yes Ripudeep Jenna Luo, MD  pantoprazole (PROTONIX) 40 MG tablet Take 1 tablet (40 mg total) by mouth 2 (two) times daily before a meal. 07/12/12  Yes Ripudeep K Rai, MD  polyethylene glycol (MIRALAX / GLYCOLAX) packet Take 17 g by mouth daily as needed (constipation). 07/12/12  Yes Ripudeep Jenna Luo, MD  Tamsulosin HCl (FLOMAX) 0.4 MG CAPS Take 0.4 mg by mouth 2 (two) times daily.    Yes Historical Provider, MD  trospium (SANCTURA) 20 MG tablet Take 1 tablet (20 mg total) by mouth 2 (two) times daily. 07/12/12  Yes Ripudeep Jenna Luo, MD   Allergies  Allergen Reactions  . Ivp Dye (Iodinated Diagnostic Agents)     FAMILY HISTORY:  History reviewed. No pertinent family history. SOCIAL HISTORY:  reports that he quit smoking about 51 years ago. He has never used smokeless tobacco. He reports that he does not drink alcohol or use illicit drugs.  REVIEW OF SYSTEMS:  See hpi.   INTERVAL HISTORY:   VITAL SIGNS: Temp:  [97.6 F (36.4 C)-98.6 F (37 C)] 98.6 F (37  C) (12/18 1200) Pulse Rate:  [63-114] 63  (12/18 1200) Resp:  [20-29] 24  (12/18 1200) BP: (74-120)/(46-73) 94/50 mmHg (12/18 1200) SpO2:  [96 %-100 %] 98 % (12/18 1200) Weight:  [84.6 kg (186 lb 8.2 oz)-84.641 kg (186 lb 9.6 oz)] 84.6 kg (186 lb 8.2 oz) (12/17 1717) HEMODYNAMICS:   VENTILATOR SETTINGS:   INTAKE / OUTPUT: Intake/Output      12/17 0701 - 12/18 0700 12/18 0701 - 12/19 0700   I.V. (mL/kg) 415 (4.9) 40 (0.5)   IV Piggyback 152 50   Total Intake(mL/kg) 567 (6.7) 90 (1.1)   Urine (mL/kg/hr) 625 (0.3) 40 (0.1)   Total Output 625 40   Net -58 +50  Stool Occurrence  1 x     PHYSICAL EXAMINATION: General:  EWM NAD @ rest Neuro:  Intact but slightly confused HEENT:  No LAN Cardiovascular:  HSIR Lungs:  Decreased bs on left , clear on rt Abdomen:  +bs Musculoskeletal:  Intact Skin:  Warm + le edema   LABS: Cbc  Lab 07/20/12 0340 07/19/12 1805  WBC 7.9 --  HGB 10.7* 10.7*  HCT 33.2* 32.0*  PLT 120* 138*    Chemistry   Lab 07/20/12 0340 07/19/12 1805  NA 130* 132*  K 4.3 4.7  CL 96 98  CO2 27 27  BUN 46* 52*  CREATININE 1.04 1.13  CALCIUM 8.5 8.8  MG -- --  PHOS -- --  GLUCOSE 100* 112*    Liver fxn No results found for this basename: AST:3,ALT:3,ALKPHOS:3,BILITOT:3,PROT:3,ALBUMIN:3 in the last 168 hours coags No results found for this basename: APTT:3,INR:3 in the last 168 hours Sepsis markers No results found for this basename: LATICACIDVEN:3,PROCALCITON:3 in the last 168 hours Cardiac markers  Lab 07/19/12 1805  CKTOTAL --  CKMB --  TROPONINI <0.30   BNP  Lab 07/19/12 1805  PROBNP 2817.0*   ABG No results found for this basename: PHART:3,PCO2ART:3,PO2ART:3,HCO3:3,TCO2:3 in the last 168 hours  CBG trend No results found for this basename: GLUCAP:5 in the last 168 hours  IMAGING: Dg Chest 1 View  07/20/2012  *RADIOLOGY REPORT*  Clinical Data: Post left thoracentesis  CHEST - 1 VIEW  Comparison: 07/19/2012  Findings:  Cardiac silhouette is obscured due to complete opacification of the left hemithorax by a large left pleural effusion with accompanying left lung atelectasis. No gross evidence of pneumothorax identified. Increased right basilar atelectasis versus consolidation. Associated right pleural effusion. No acute osseous findings.  IMPRESSION: Increased opacification of the left hemithorax by left pleural effusion and atelectasis versus consolidation of left lung. Increased right basilar consolidation with persistent right pleural effusion.   Original Report Authenticated By: Ulyses Southward, M.D.    Dg Chest 2 View  07/19/2012  *RADIOLOGY REPORT*  Clinical Data: Pneumonia.  Cough, shortness of breath and chest pain.  CHEST - 2 VIEW  Comparison: Plain films of the chest 07/06/2012.  CT chest 07/02/2012.  Findings: There has been marked increase in the patient's left pleural effusion.  Smaller right pleural effusion has also increased.  Extensive airspace disease in the left chest has worsened.  Right basilar airspace opacity does not appear notably changed.  Cardiac silhouette is obscured.  The patient is status post CABG.  IMPRESSION:  1.  Increased left much larger than right pleural effusions. 2.  Extensive left side airspace disease has worsened.  Right basilar airspace opacity is unchanged.   Original Report Authenticated By: Holley Dexter, M.D.     ECG:  DIAGNOSES: Active Problems:  ESSENTIAL HYPERTENSION  COPD (chronic obstructive pulmonary disease)  CAD (coronary artery disease)  Necrotizing pneumonia, community acquired MRSA  Dysphagia, pharyngeal  Pleural effusion  Atrial fibrillation  Pedal edema   ASSESSMENT / PLAN:  PULMONARY  ASSESSMENT: Recent MRSA necrotizing pna with DC to [facility] 12-10 and readmit 12-17 with complete opacification of left lung. US reveals loculation. PLAN:   --Needs CVTS consult for vats -I spoke to Dr gerhardt who will evaluate pt after transfer to [facility] -pulmonary  toilet with vibra vest x 48 hours -BD's  -O2 as needed  CARDIOVASCULAR Followed by [facility] ASSESSMENT:  Hx of Afib AS CAD HTN Valvular DZ PLAN:  -bb -Stop lasix for now  RENAL Lab Results  Component Value Date   CREATININE 1.04 07/20/2012   CREATININE 1.13 07/19/2012   CREATININE 1.39* 07/12/2012    ASSESSMENT:   RI PLAN:   -monitor - stop lasix as he has been getting fluid bolus  GASTROINTESTINAL  ASSESSMENT:   GERD Aspiration PLAN:   -PPI -Dys 3 diet  HEMATOLOGIC  ASSESSMENT:   Hx of MM PLAN:  -monitor  INFECTIOUS  ASSESSMENT:   Recent MRSA Pna/ empyema PLAN:   -ct vanc, cefepime, levaquin- can simplify after decortication -VATS    NEUROLOGIC  ASSESSMENT:   Mild confusion most likely from metabolic process PLAN:   -Monitor  CLINICAL SUMMARY: 78 yo with opacified left lung with recent MRSA necrotizing pna. He will most likely needs CVTS consult, transfer to [facility] and VATS for loculated effusion/empyema on the left.  Dr gerhardt to evaluate on arrival   [facility] ACNP Adolph Pollack PCCM Pager [phone] till 3 pm If no answer page [phone] 07/20/2012, 1:20 PM   I have personally obtained a history, examined the patient, evaluated laboratory and imaging results, formulated the assessment and plan and placed orders.   ALVA,RAKESH V.  [phone]

## 2012-07-20 NOTE — Progress Notes (Signed)
VASCULAR LAB PRELIMINARY  PRELIMINARY  PRELIMINARY  PRELIMINARY   Bilateral lower extremity venous duplex completed.    Preliminary report:  Bilateral:  No evidence of DVT, superficial thrombosis, or Baker's Cyst.   Jeovany Huitron, RVS 07/20/2012, 9:56 AM

## 2012-07-20 NOTE — Consult Note (Signed)
[address]            [place] [address]          [phone]      Christian [name] [facility] Medical Record #[id number] Date of Birth: 1932-06-11  Referring: Dr Vassie Loll Primary Care: Michele Mcalpine, MD  Chief Complaint:    Chief Complaint  Patient presents with  . Shortness of Breath    History of Present Illness:    Patient is 78 year old gentleman just after Thanksgiving was  was admitted to [facility] with COPD exacerbation and with necrotizing community-acquired MRSA pneumonia. The patient aspirated during his inpatient stay, swallow study showed silent aspiration. He had a short stay in the ICU. The patient was discharged approximately week ago to [facility]. Since discharge he has received his antibiotics, he has remained short of breath, he was oxygen dependent and hypoxic at the time of discharge. Yesterday  he had a follow up in Pulmonary office , chest x-ray revealed possible worsening pneumonia and increasing left  pleural effusion. He was sent to the ER to be evaluated and was admitted to [facility]. Attempt at thoracentesis was not successful, chest xray today shows white out of left pleural space.  He has previous history of mass resected from left chest via sternotomy by Dr Andrey Campanile in 1980 ? Melanoma. He notes left diaphragm was injured at that time Current Activity/ Functional Status: Patient is not independent with mobility/ambulation, transfers, ADL's, IADL's. Since recent admission was living at [facility] nursing home   Past Medical History  Diagnosis Date  . Allergic rhinitis   . COPD (chronic obstructive pulmonary disease)   . Disorders of diaphragm   . Acute bronchitis   . Hypertension   . CAD (coronary artery disease)     Catheterization 2009, mild coronary disease (after abnormal nuclear study,With hypotensive response to exercise, 2009)  . RBBB   . Atrial flutter     Ablated in the past, no recurrence  . Venous insufficiency     . Edema   . Dyslipidemia     Patient has an and to use statin  . GERD (gastroesophageal reflux disease)   . Diverticulosis of colon   . Hypertrophy of prostate with urinary obstruction and other lower urinary tract symptoms (LUTS)   . Increased prostate specific antigen (PSA) velocity   . DJD (degenerative joint disease)   . Chronic insomnia   . Malignant melanoma     Removed from mediastinum via thoracotomy July, 2010  . Ejection fraction     EF 60%,Echo, 2009,  /  EF 55-60%, echo, February, 2013  . Aortic stenosis     Mild, echo and catheterization, 2009 /  Mild, echo, February, 2013  . Mitral stenosis     Very mild, echo, from mitral annular calcification  . Shortness of breath     Episodes of feeling shortness of breath with some mild dizziness with mild exertion., February, 2013  . Tricuspid regurgitation     Moderate, echo, February, 2013, 56 mmHg  . Pulmonary hypertension     56 mmHg, echo, February, 2013  . Pneumonia     Past Surgical History  Procedure Date  . Bilateral inguinal hernia repair   . Umbilical hernia repair   . Median sternotomy for mediastinal melanoma 1980s    History reviewed. No pertinent family history.  History   Social History  .  Marital Status: Married    Spouse Name: evelyn Vanvalkenburgh    Number of Children: 6  . Years of Education: N/A   Occupational History  . 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 Smoking status: Former Smoker    Quit date: 08/03/1960  . Smokeless tobacco: Never Used  . Alcohol Use: No  . Drug Use: No    Social History Narrative   2 biological children and 4  adopted children    History  Smoking status  . Former Smoker  . Quit date: 08/03/1960  Smokeless tobacco  . Never Used    History  Alcohol Use No     Allergies  Allergen Reactions  . Ivp Dye (Iodinated Diagnostic Agents)     Current Facility-Administered Medications  Medication Dose Route Frequency Provider Last Rate  Last Dose  . 0.9 %  sodium chloride infusion   Intravenous Continuous Roma Kayser Schorr, NP 10 mL/hr at 07/20/12 0304    . acetaminophen (TYLENOL) tablet 650 mg  650 mg Oral Q6H PRN Debby Crosley, MD       Or  . acetaminophen (TYLENOL) suppository 650 mg  650 mg Rectal Q6H PRN Debby Crosley, MD      . alum & mag hydroxide-simeth (MAALOX/MYLANTA) 200-200-20 MG/5ML suspension 30 mL  30 mL Oral Q6H PRN Debby Crosley, MD      . ceFEPIme (MAXIPIME) 1 g in dextrose 5 % 50 mL IVPB  1 g Intravenous Q12H Thuyvan Thi Phan, PHARMD   1 g at 07/20/12 0900  . dutasteride (AVODART) capsule 0.5 mg  0.5 mg Oral Daily Debby Crosley, MD   0.5 mg at 07/20/12 0934  . enoxaparin (LOVENOX) injection 30 mg  30 mg Subcutaneous QHS Debby Crosley, MD   30 mg at 07/20/12 0000  . guaiFENesin (MUCINEX) 12 hr tablet 600 mg  600 mg Oral BID Osvaldo Shipper, MD   600 mg at 07/20/12 0934  . guaiFENesin-dextromethorphan (ROBITUSSIN DM) 100-10 MG/5ML syrup 5 mL  5 mL Oral Q4H PRN Debby Crosley, MD      . levalbuterol (XOPENEX) nebulizer solution 0.63 mg  0.63 mg Nebulization Q6H PRN Osvaldo Shipper, MD      . levofloxacin (LEVAQUIN) IVPB 750 mg  750 mg Intravenous Q24H Thuyvan Thi Phan, PHARMD      . metoprolol (LOPRESSOR) tablet 50 mg  50 mg Oral BID Osvaldo Shipper, MD   50 mg at 07/20/12 0934  . oxyCODONE (Oxy IR/ROXICODONE) immediate release tablet 5 mg  5 mg Oral Q4H PRN Gery Pray, MD   5 mg at 07/19/12 2359  . pantoprazole (PROTONIX) EC tablet 40 mg  40 mg Oral BID AC Debby Crosley, MD   40 mg at 07/20/12 0934  . polyethylene glycol (MIRALAX / GLYCOLAX) packet 17 g  17 g Oral Daily PRN Debby Crosley, MD      . saccharomyces boulardii (FLORASTOR) capsule 250 mg  250 mg Oral BID Osvaldo Shipper, MD   250 mg at 07/20/12 1400  . sodium chloride 0.9 % injection 3 mL  3 mL Intravenous PRN Debby Crosley, MD      . Tamsulosin HCl (FLOMAX) capsule 0.4 mg  0.4 mg Oral BID Debby Crosley, MD   0.4 mg at 07/20/12 0934  . vancomycin  (VANCOCIN) IVPB 1000 mg/200 mL premix  1,000 mg Intravenous Q12H Thuyvan Stacey Drain, PHARMD       Prescriptions prior to admission  Medication Sig Dispense Refill  . alum &  mag hydroxide-simeth (MAALOX/MYLANTA) 200-200-20 MG/5ML suspension Take 30 mLs by mouth every 6 (six) hours as needed. Stomach discomfort.      . Alum & Mag Hydroxide-Simeth (MAGIC MOUTHWASH) SOLN Take 5 mLs by mouth 4 (four) times daily. Swish and swallow.      Marland Kitchen amoxicillin-clavulanate (AUGMENTIN) 875-125 MG per tablet Take 1 tablet by mouth 2 (two) times daily. X 4 weeks, stop date Jan 6th.  60 tablet  0  . AVODART 0.5 MG capsule TAKE 1 CAPSULE EVERY DAY  30 capsule  10  . etodolac (LODINE XL) 400 MG 24 hr tablet TAKE 1 TABLET (400 MG TOTAL) BY MOUTH DAILY.  60 tablet  5  . guaiFENesin-dextromethorphan (ROBITUSSIN DM) 100-10 MG/5ML syrup Take 5 mLs by mouth every 4 (four) hours as needed for cough.  118 mL    . ipratropium (ATROVENT) 0.02 % nebulizer solution Take 2.5 mLs (0.5 mg total) by nebulization 3 (three) times daily.  75 mL    . levalbuterol (XOPENEX) 0.63 MG/3ML nebulizer solution Take 3 mLs (0.63 mg total) by nebulization 3 (three) times daily.  3 mL    . linezolid (ZYVOX) 600 MG tablet Take 1 tablet (600 mg total) by mouth every 12 (twelve) hours. For 4 weeks, stop date Jan 6th, 2014  60 tablet  0  . oxyCODONE (OXY IR/ROXICODONE) 5 MG immediate release tablet Take 5 mg by mouth every 4 (four) hours as needed.      . pantoprazole (PROTONIX) 40 MG tablet Take 1 tablet (40 mg total) by mouth 2 (two) times daily before a meal.      . polyethylene glycol (MIRALAX / GLYCOLAX) packet Take 17 g by mouth daily as needed (constipation).  14 each    . Tamsulosin HCl (FLOMAX) 0.4 MG CAPS Take 0.4 mg by mouth 2 (two) times daily.       . trospium (SANCTURA) 20 MG tablet Take 1 tablet (20 mg total) by mouth 2 (two) times daily.  60 tablet  0      Review of Systems:     Cardiac Review of Systems: Y or N  Chest Pain [    ]    Resting SOB [ y  ] Exertional SOB  [ y ]  Orthopnea Cove.Etienne  ]   Pedal Edema [yes increasing   ]    Palpitations Milo.Brash  ] Syncope  [ n ]   Presyncope [ n  ]  General Review of Systems: [Y] = yes [  ]=no Constitional: recent weight change [ y ]; anorexia [ y ]; fatigue [ y ]; nausea [  ]; night sweats [ y ]; fever [ m ]; or chills [  ];  Dental: poor dentition[n  ];   Eye : blurred vision [  ]; diplopia [   ]; vision changes [  ];  Amaurosis fugax[  ]; Resp: cough [  ];  wheezing[  ];  hemoptysis[  ]; shortness of breath[  ]; paroxysmal nocturnal dyspnea[  ]; dyspnea on exertion[  ]; or orthopnea[  ];  GI:  gallstones[  ], vomiting[  ];  dysphagia[  ]; melena[  ];  hematochezia [  ]; heartburn[  ];   Hx of  Colonoscopy[  ]; GU: kidney stones [  ]; hematuria[  ];   dysuria [  ];  nocturia[  ];  history of     obstruction [  ];             Skin: rash, swelling[  ];, hair loss[  ];  peripheral edema[  ];  or itching[  ]; Musculosketetal: myalgias[  ];  joint swelling[  ];  joint erythema[  ];  joint pain[  ];  back pain[  ];  Heme/Lymph: bruising[  ];  bleeding[ n ];  anemia[  ];  Neuro: TIA[  ];  headaches[  ];  stroke[  ];  vertigo[  ];  seizures[  ];   paresthesias[  ];  difficulty walking[ y ];  Psych:depression[  ]; anxiety[  ];  Endocrine: diabetes[  ];  thyroid dysfunction[  ];  Immunizations: Flu [  y]; Pneumococcal[ y ];  Other:  Physical Exam: BP 107/62  Pulse 87  Temp 97.9 F (36.6 C) (Oral)  Resp 28  Ht 6' 0.05" (1.83 m)  Wt 186 lb 8.2 oz (84.6 kg)  BMI 25.26 kg/m2  SpO2 100%  General appearance: alert, cooperative, appears older than stated age, flushed and mild distress Neurologic: intact Heart: normal apical impulse, irregularly irregular rhythm, no click and no rub Lungs: diminished breath sounds LLL and LUL Abdomen: soft, non-tender; bowel  sounds normal; no masses,  no organomegaly Extremities: edema 2-3 + edema lower extremities and Homans sign is negative, no sign of DVT   Diagnostic Studies & Laboratory data:     Recent Radiology Findings:   Dg Chest 1 View  07/20/2012  *RADIOLOGY REPORT*  Clinical Data: Post left thoracentesis  CHEST - 1 VIEW  Comparison: 07/19/2012  Findings: Cardiac silhouette is obscured due to complete opacification of the left hemithorax by a large left pleural effusion with accompanying left lung atelectasis. No gross evidence of pneumothorax identified. Increased right basilar atelectasis versus consolidation. Associated right pleural effusion. No acute osseous findings.  IMPRESSION: Increased opacification of the left hemithorax by left pleural effusion and atelectasis versus consolidation of left lung. Increased right basilar consolidation with persistent right pleural effusion.   Original Report Authenticated By: Ulyses Southward, M.D.    Dg Chest 2 View  07/19/2012  *RADIOLOGY REPORT*  Clinical Data: Pneumonia.  Cough, shortness of breath and chest pain.  CHEST - 2 VIEW  Comparison: Plain films of the chest 07/06/2012.  CT chest 07/02/2012.  Findings: There has been marked increase in the patient's left pleural effusion.  Smaller right pleural effusion has also increased.  Extensive airspace disease in the left chest has worsened.  Right basilar airspace opacity does not appear notably changed.  Cardiac silhouette is obscured.  The patient is status post CABG.  IMPRESSION:  1.  Increased left much larger than right pleural effusions. 2.  Extensive left side airspace disease has worsened.  Right basilar airspace opacity is unchanged.   Original Report  Authenticated By: Holley Dexter, M.D.       Recent Lab Findings: Lab Results  Component Value Date   WBC 7.9 07/20/2012   HGB 10.7* 07/20/2012   HCT 33.2* 07/20/2012   PLT 120* 07/20/2012   GLUCOSE 100* 07/20/2012   CHOL 135 06/27/2012   TRIG 40  06/27/2012   HDL 51 06/27/2012   LDLCALC 76 06/27/2012   ALT 17 06/28/2012   AST 27 06/28/2012   NA 130* 07/20/2012   K 4.3 07/20/2012   CL 96 07/20/2012   CREATININE 1.04 07/20/2012   BUN 46* 07/20/2012   CO2 27 07/20/2012   TSH 3.13 05/19/2011   INR 1.0 RATIO 01/12/2008   HGBA1C 5.6 07/02/2012      Assessment / Plan:       Necrotizing pneumonia, community acquired MRSA  Rapidly increasing left  Pleural effusion consistent with empyema Dysphagia, pharyngeal   Atrial fibrillation  Hypertension COPD (chronic obstructive pulmonary disease)  CAD (coronary artery disease) mild AS   I have seen and examined the patient, will get repeat ct to access the extent   pulmonary disease, I have recommended Bronch, Left VATS/ drainage of effusion and decortcation. Dr Dorris Fetch has OR time tomorrow late morning and can possibly proceed with surgical drainage. The goals risks and alternatives of the planned surgical procedure Bronch, left VATS  have been discussed with the patient in detail and with his daughter and son. The risks of the procedure including death, infection, stroke, myocardial infarction, bleeding, blood transfusion have all been discussed specifically.  I have quoted Marzella Schlein a 10 % of perioperative mortality and a complication rate as high as 25 %. The patient's questions have been answered.Christian [name] is willing  to proceed with the planned procedure.  Delight Ovens MD  Beeper [phone] Office [phone] 07/20/2012 6:06 PM

## 2012-07-20 NOTE — Progress Notes (Signed)
ANTIBIOTIC CONSULT NOTE - FOLLOW UP  Pharmacy Consult for Vancomycin, renal abx adjustment Indication: worsening PNA  Allergies  Allergen Reactions  . Ivp Dye (Iodinated Diagnostic Agents)     Patient Measurements: Height: 6' 0.05" (183 cm) Weight: 186 lb 8.2 oz (84.6 kg) IBW/kg (Calculated) : 77.71      Labs:  Basename 07/20/12 0340 07/19/12 1805  WBC 7.9 11.2*  HGB 10.7* 10.7*  PLT 120* 138*  LABCREA -- --  CREATININE 1.04 1.13    Assessment:  80 yom recently discharged (12/10) with Augmentin + Linezolid (until 08/08/12) for CA-MRSA necrotizing PNA and Fluconazole x 10d for oral thrush.  Pt presented 12/17 at PCP's recommendation given worsening pneumonia/pleural effusion on CXR and SOB.  IV antibiotics started per HCAP protocol.   Today is Day #2/8 Vancomycin and Cefepime, D#2/3 Levaquin.  Pt is afebrile, WBC down to wnl, Scr 1.04, CG 64, N 59 ml/min. Given known history of MRSA pneumonia, will be aggressive with Vancomycin dosing.    Goal of Therapy:  Vancomycin trough level 15-20 mcg/ml Appropriate renal adjustment for Levaquin/Cefepime Eradication of infection  Plan:   Increase Vancomycin to 1gm IV q12h for a total of 8days therapy  Change Cefepime to 1g IV q12h for a total of 8days therapy  Change Levaquin to 750 mg IV q24h x 2 more doses   Pharmacy will f/u   Geoffry Paradise, PharmD, BCPS Pager: [phone] 7:56 AM Pharmacy #: [phone]

## 2012-07-20 NOTE — Progress Notes (Deleted)
CRITICAL VALUE ALERT  Critical value received:  K 2.7   Date of notification:  07/20/12  Time of notification:  1200  Critical value read back: yes  Nurse who received alert:  Allyne Gee, RN  MD notified (1st page):  Dr. Barnie Del  Time of first page:  1205  MD notified (2nd page):  Time of second page:  Responding MD:  Dr. Barnie Del  Time MD responded:  [phone]

## 2012-07-20 NOTE — Progress Notes (Signed)
Patient transferring to [facility] room 3309.  Report called to [place], [place].  Patient to transfer by [facility].  Will continue to monitor.

## 2012-07-20 NOTE — Progress Notes (Signed)
Pt admitted to 3300 from [facility]. Oriented to unit and room. Safety discussed and call bell with in reach. VSS.Will continue to monitor.   Elijah Birk, RN

## 2012-07-20 NOTE — Progress Notes (Signed)
CARE MANAGEMENT NOTE 07/20/2012  Patient:  HERNY, SCURLOCK   Account Number:  [id number]  Date Initiated:  07/20/2012  Documentation initiated by:  Myrlene Riera  Subjective/Objective Assessment:   pt readmitted due to aspiration pneumonia and poss sepsis     Action/Plan:   from [facility]   Anticipated DC Date:  07/23/2012   Anticipated DC Plan:  SKILLED NURSING FACILITY  In-house referral  Clinical Social Worker      DC Planning Services  NA      [facility] Choice  NA   Choice offered to / List presented to:  NA   DME arranged  NA      DME agency  NA     HH arranged  NA      HH agency  NA   Status of service:  In process, will continue to follow Medicare Important Message given?  NA - LOS <3 / Initial given by admissions (If response is "NO", the following Medicare IM given date fields will be blank) Date Medicare IM given:   Date Additional Medicare IM given:    Discharge Disposition:    Per UR Regulation:  Reviewed for med. necessity/level of care/duration of stay  If discussed at Long Length of Stay Meetings, dates discussed:    Comments:  [id number] [name], RN, BSN, CCM: CHART REVIEWED AND UPDATED.  Next chart review due on [id number]. NO DISCHARGE NEEDS PRESENT AT THIS TIME.  Have spoken with the daguhter Andrey Campanile concerning the level of care that her Father is recieving due to family upset about condition. Reason for medications and treatments explained and Daughter was able to talk to the MD via phone.  Family outlook toward patient and staff improved. CASE MANAGEMENT [phone]

## 2012-07-21 ENCOUNTER — Inpatient Hospital Stay (HOSPITAL_COMMUNITY): Payer: Medicare Other

## 2012-07-21 ENCOUNTER — Inpatient Hospital Stay (HOSPITAL_COMMUNITY): Payer: Medicare Other | Admitting: Certified Registered"

## 2012-07-21 ENCOUNTER — Encounter (HOSPITAL_COMMUNITY): Payer: Self-pay | Admitting: Radiology

## 2012-07-21 ENCOUNTER — Encounter (HOSPITAL_COMMUNITY)
Admission: EM | Disposition: A | Discharge status: Skilled Nursing Facility | Payer: Self-pay | Source: Home / Self Care | Attending: Thoracic Surgery (Cardiothoracic Vascular Surgery)

## 2012-07-21 ENCOUNTER — Encounter (HOSPITAL_COMMUNITY): Payer: Self-pay | Admitting: Certified Registered"

## 2012-07-21 DIAGNOSIS — J869 Pyothorax without fistula: Secondary | ICD-10-CM

## 2012-07-21 HISTORY — PX: VIDEO ASSISTED THORACOSCOPY (VATS)/EMPYEMA: SHX6172

## 2012-07-21 HISTORY — PX: VIDEO BRONCHOSCOPY: SHX5072

## 2012-07-21 LAB — URINALYSIS, ROUTINE W REFLEX MICROSCOPIC
Bilirubin Urine: NEGATIVE
Glucose, UA: NEGATIVE mg/dL
Hgb urine dipstick: NEGATIVE
Ketones, ur: NEGATIVE mg/dL
Leukocytes, UA: NEGATIVE
Nitrite: NEGATIVE
Protein, ur: NEGATIVE mg/dL
Specific Gravity, Urine: 1.026 (ref 1.005–1.030)
Urobilinogen, UA: 0.2 mg/dL (ref 0.0–1.0)
pH: 5.5 (ref 5.0–8.0)

## 2012-07-21 LAB — BLOOD GAS, ARTERIAL
Acid-Base Excess: 0.2 mmol/L (ref 0.0–2.0)
Bicarbonate: 22.9 mEq/L (ref 20.0–24.0)
TCO2: 23.8 mmol/L (ref 0–100)
pCO2 arterial: 28.7 mmHg — ABNORMAL LOW (ref 35.0–45.0)
pO2, Arterial: 167 mmHg — ABNORMAL HIGH (ref 80.0–100.0)

## 2012-07-21 LAB — GLUCOSE, CAPILLARY: Glucose-Capillary: 85 mg/dL (ref 70–99)

## 2012-07-21 LAB — CBC
MCV: 89 fL (ref 78.0–100.0)
Platelets: 117 10*3/uL — ABNORMAL LOW (ref 150–400)
RDW: 13.1 % (ref 11.5–15.5)
WBC: 5.8 10*3/uL (ref 4.0–10.5)

## 2012-07-21 LAB — BASIC METABOLIC PANEL
BUN: 38 mg/dL — ABNORMAL HIGH (ref 6–23)
Chloride: 100 mEq/L (ref 96–112)
Glucose, Bld: 117 mg/dL — ABNORMAL HIGH (ref 70–99)
Potassium: 4.3 mEq/L (ref 3.5–5.1)

## 2012-07-21 SURGERY — VIDEO ASSISTED THORACOSCOPY (VATS)/EMPYEMA
Anesthesia: General | Site: Mouth | Laterality: Left | Wound class: Clean Contaminated

## 2012-07-21 MED ORDER — OXYCODONE-ACETAMINOPHEN 5-325 MG PO TABS: 1.0000 | ORAL_TABLET | ORAL | Status: DC | PRN

## 2012-07-21 MED ORDER — IPRATROPIUM BROMIDE 0.02 % IN SOLN
0.5000 mg | Freq: Four times a day (QID) | RESPIRATORY_TRACT | Status: DC
  Administered 2012-07-21 – 2012-08-09 (×69): 0.5 mg via RESPIRATORY_TRACT
  Filled 2012-07-21 (×72): qty 2.5

## 2012-07-21 MED ORDER — ROCURONIUM BROMIDE 100 MG/10ML IV SOLN
INTRAVENOUS | Status: DC | PRN
  Administered 2012-07-21: 20 mg via INTRAVENOUS
  Administered 2012-07-21 (×2): 50 mg via INTRAVENOUS

## 2012-07-21 MED ORDER — POTASSIUM CHLORIDE 10 MEQ/50ML IV SOLN
10.0000 meq | Freq: Every day | INTRAVENOUS | Status: DC | PRN
  Filled 2012-07-21: qty 50

## 2012-07-21 MED ORDER — INSULIN ASPART 100 UNIT/ML ~~LOC~~ SOLN
0.0000 [IU] | Freq: Four times a day (QID) | SUBCUTANEOUS | Status: DC
  Administered 2012-07-23 – 2012-07-25 (×3): 2 [IU] via SUBCUTANEOUS

## 2012-07-21 MED ORDER — SODIUM CHLORIDE 0.9 % IV SOLN
10.0000 mg | INTRAVENOUS | Status: DC | PRN
  Administered 2012-07-21: 25 ug/min via INTRAVENOUS

## 2012-07-21 MED ORDER — MIDAZOLAM HCL 5 MG/5ML IJ SOLN
INTRAMUSCULAR | Status: DC | PRN
  Administered 2012-07-21: 2 mg via INTRAVENOUS

## 2012-07-21 MED ORDER — ACETAMINOPHEN 10 MG/ML IV SOLN: 1000.0000 mg | Freq: Four times a day (QID) | INTRAVENOUS | Status: DC

## 2012-07-21 MED ORDER — BIOTENE DRY MOUTH MT LIQD
15.0000 mL | Freq: Four times a day (QID) | OROMUCOSAL | Status: DC
  Administered 2012-07-22 – 2012-08-09 (×55): 15 mL via OROMUCOSAL

## 2012-07-21 MED ORDER — ALBUMIN HUMAN 5 % IV SOLN
INTRAVENOUS | Status: DC | PRN
  Administered 2012-07-21 (×2): via INTRAVENOUS

## 2012-07-21 MED ORDER — 0.9 % SODIUM CHLORIDE (POUR BTL) OPTIME
TOPICAL | Status: DC | PRN
  Administered 2012-07-21: 3000 mL

## 2012-07-21 MED ORDER — LACTATED RINGERS IV SOLN
INTRAVENOUS | Status: DC | PRN
  Administered 2012-07-21 (×2): via INTRAVENOUS

## 2012-07-21 MED ORDER — FENTANYL CITRATE 0.05 MG/ML IJ SOLN
25.0000 ug | INTRAMUSCULAR | Status: DC | PRN
  Administered 2012-07-23: 50 ug via INTRAVENOUS
  Administered 2012-07-23: 25 ug via INTRAVENOUS
  Administered 2012-07-27 (×2): 50 ug via INTRAVENOUS
  Filled 2012-07-21 (×5): qty 2

## 2012-07-21 MED ORDER — FENTANYL CITRATE 0.05 MG/ML IJ SOLN
INTRAMUSCULAR | Status: AC
  Filled 2012-07-21: qty 2

## 2012-07-21 MED ORDER — ALBUMIN HUMAN 5 % IV SOLN
12.5000 g | Freq: Once | INTRAVENOUS | Status: AC
  Administered 2012-07-22: 12.5 g via INTRAVENOUS
  Filled 2012-07-21: qty 250

## 2012-07-21 MED ORDER — MIDAZOLAM HCL 2 MG/2ML IJ SOLN
INTRAMUSCULAR | Status: AC
  Filled 2012-07-21: qty 2

## 2012-07-21 MED ORDER — SODIUM CHLORIDE 0.9 % IV SOLN
1.0000 mg/h | INTRAVENOUS | Status: DC
  Administered 2012-07-21: 2 mg/h via INTRAVENOUS
  Administered 2012-07-22: 3 mg/h via INTRAVENOUS
  Filled 2012-07-21 (×2): qty 10

## 2012-07-21 MED ORDER — DEXTROSE-NACL 5-0.45 % IV SOLN: INTRAVENOUS | Status: DC

## 2012-07-21 MED ORDER — CHLORHEXIDINE GLUCONATE 0.12 % MT SOLN
15.0000 mL | Freq: Two times a day (BID) | OROMUCOSAL | Status: DC
  Administered 2012-07-21 – 2012-08-09 (×30): 15 mL via OROMUCOSAL
  Filled 2012-07-21 (×35): qty 15

## 2012-07-21 MED ORDER — LIDOCAINE HCL (CARDIAC) 20 MG/ML IV SOLN
INTRAVENOUS | Status: DC | PRN
  Administered 2012-07-21: 100 mg via INTRAVENOUS

## 2012-07-21 MED ORDER — POTASSIUM CHLORIDE IN NACL 20-0.45 MEQ/L-% IV SOLN
INTRAVENOUS | Status: DC
  Administered 2012-07-21: 75 mL/h via INTRAVENOUS
  Filled 2012-07-21 (×3): qty 1000

## 2012-07-21 MED ORDER — SENNOSIDES-DOCUSATE SODIUM 8.6-50 MG PO TABS
1.0000 | ORAL_TABLET | Freq: Every evening | ORAL | Status: DC | PRN
  Filled 2012-07-21: qty 1

## 2012-07-21 MED ORDER — HYDROMORPHONE HCL PF 1 MG/ML IJ SOLN
0.2500 mg | INTRAMUSCULAR | Status: DC | PRN
  Administered 2012-07-21 (×2): 0.5 mg via INTRAVENOUS

## 2012-07-21 MED ORDER — TRAMADOL HCL 50 MG PO TABS
50.0000 mg | ORAL_TABLET | Freq: Four times a day (QID) | ORAL | Status: DC | PRN
  Administered 2012-07-23: 50 mg via ORAL
  Administered 2012-07-23 – 2012-08-02 (×12): 100 mg via ORAL
  Administered 2012-08-03 (×2): 50 mg via ORAL
  Administered 2012-08-05 – 2012-08-08 (×4): 100 mg via ORAL
  Administered 2012-08-09: 50 mg via ORAL
  Filled 2012-07-21: qty 2
  Filled 2012-07-21: qty 1
  Filled 2012-07-21 (×9): qty 2
  Filled 2012-07-21: qty 1
  Filled 2012-07-21 (×6): qty 2
  Filled 2012-07-21 (×2): qty 1

## 2012-07-21 MED ORDER — INSULIN ASPART 100 UNIT/ML ~~LOC~~ SOLN: 0.0000 [IU] | Freq: Four times a day (QID) | SUBCUTANEOUS | Status: DC

## 2012-07-21 MED ORDER — PANTOPRAZOLE SODIUM 40 MG IV SOLR
40.0000 mg | Freq: Every day | INTRAVENOUS | Status: DC
  Administered 2012-07-22 – 2012-07-28 (×7): 40 mg via INTRAVENOUS
  Filled 2012-07-21 (×10): qty 40

## 2012-07-21 MED ORDER — ONDANSETRON HCL 4 MG/2ML IJ SOLN
4.0000 mg | Freq: Four times a day (QID) | INTRAMUSCULAR | Status: DC | PRN
  Administered 2012-07-24 – 2012-08-08 (×16): 4 mg via INTRAVENOUS
  Filled 2012-07-21 (×16): qty 2

## 2012-07-21 MED ORDER — LACTATED RINGERS IV SOLN
INTRAVENOUS | Status: DC
  Administered 2012-07-21: 12:00:00 via INTRAVENOUS

## 2012-07-21 MED ORDER — FENTANYL CITRATE 0.05 MG/ML IJ SOLN
INTRAMUSCULAR | Status: DC | PRN
  Administered 2012-07-21: 200 ug via INTRAVENOUS

## 2012-07-21 MED ORDER — MIDAZOLAM BOLUS VIA INFUSION
1.0000 mg | INTRAVENOUS | Status: DC | PRN
  Filled 2012-07-21: qty 2

## 2012-07-21 MED ORDER — MIDAZOLAM HCL 2 MG/2ML IJ SOLN
2.0000 mg | Freq: Once | INTRAMUSCULAR | Status: AC
  Administered 2012-07-21: 2 mg via INTRAVENOUS

## 2012-07-21 MED ORDER — ACETAMINOPHEN 10 MG/ML IV SOLN
1000.0000 mg | Freq: Four times a day (QID) | INTRAVENOUS | Status: AC
  Administered 2012-07-21 – 2012-07-22 (×4): 1000 mg via INTRAVENOUS
  Filled 2012-07-21 (×4): qty 100

## 2012-07-21 MED ORDER — PROPOFOL 10 MG/ML IV BOLUS
INTRAVENOUS | Status: DC | PRN
  Administered 2012-07-21: 100 mg via INTRAVENOUS

## 2012-07-21 MED ORDER — OXYCODONE HCL 5 MG PO TABS: 5.0000 mg | ORAL_TABLET | ORAL | Status: AC | PRN

## 2012-07-21 MED ORDER — SODIUM CHLORIDE 0.9 % IV SOLN
25.0000 ug/h | INTRAVENOUS | Status: DC
  Administered 2012-07-21: 50 ug/h via INTRAVENOUS
  Filled 2012-07-21: qty 50

## 2012-07-21 MED ORDER — BISACODYL 5 MG PO TBEC: 10.0000 mg | DELAYED_RELEASE_TABLET | Freq: Every day | ORAL | Status: DC

## 2012-07-21 MED ORDER — PHENYLEPHRINE HCL 10 MG/ML IJ SOLN: 10.0000 mg | INTRAVENOUS | Status: DC | PRN

## 2012-07-21 MED ORDER — FENTANYL BOLUS VIA INFUSION
25.0000 ug | Freq: Four times a day (QID) | INTRAVENOUS | Status: DC | PRN
  Filled 2012-07-21: qty 100

## 2012-07-21 MED ORDER — LEVALBUTEROL HCL 0.63 MG/3ML IN NEBU
0.6300 mg | INHALATION_SOLUTION | Freq: Four times a day (QID) | RESPIRATORY_TRACT | Status: DC
  Administered 2012-07-21 – 2012-08-09 (×69): 0.63 mg via RESPIRATORY_TRACT
  Filled 2012-07-21 (×83): qty 3

## 2012-07-21 MED ORDER — GUAIFENESIN 100 MG/5ML PO SOLN
5.0000 mL | ORAL | Status: DC
  Filled 2012-07-21 (×9): qty 5

## 2012-07-21 SURGICAL SUPPLY — 78 items
ADH SKN CLS LQ APL DERMABOND (GAUZE/BANDAGES/DRESSINGS) ×2
BAG SPEC RTRVL LRG 6X4 10 (ENDOMECHANICALS)
CANISTER SUCTION 2500CC (MISCELLANEOUS) ×6 IMPLANT
CATH KIT ON Q 5IN SLV (PAIN MANAGEMENT) IMPLANT
CATH THORACIC 28FR (CATHETERS) IMPLANT
CATH THORACIC 36FR (CATHETERS) IMPLANT
CATH THORACIC 36FR RT ANG (CATHETERS) IMPLANT
CLIP TI MEDIUM 6 (CLIP) ×3 IMPLANT
CLOTH BEACON ORANGE TIMEOUT ST (SAFETY) ×3 IMPLANT
CONN 1/4X1/4 STERILE (MISCELLANEOUS) ×2 IMPLANT
CONN ST 1/4X3/8  BEN (MISCELLANEOUS) ×2
CONN ST 1/4X3/8 BEN (MISCELLANEOUS) IMPLANT
CONN Y 3/8X3/8X3/8  BEN (MISCELLANEOUS) ×1
CONN Y 3/8X3/8X3/8 BEN (MISCELLANEOUS) IMPLANT
CONT SPEC 4OZ CLIKSEAL STRL BL (MISCELLANEOUS) ×6 IMPLANT
DERMABOND ADHESIVE PROPEN (GAUZE/BANDAGES/DRESSINGS) ×1
DERMABOND ADVANCED .7 DNX6 (GAUZE/BANDAGES/DRESSINGS) IMPLANT
DRAIN CHANNEL 28F RND 3/8 FF (WOUND CARE) IMPLANT
DRAIN CHANNEL 32F RND 10.7 FF (WOUND CARE) ×2 IMPLANT
DRAPE LAPAROSCOPIC ABDOMINAL (DRAPES) ×3 IMPLANT
DRAPE WARM FLUID 44X44 (DRAPE) ×5 IMPLANT
ELECT REM PT RETURN 9FT ADLT (ELECTROSURGICAL) ×3
ELECTRODE REM PT RTRN 9FT ADLT (ELECTROSURGICAL) ×2 IMPLANT
GLOVE BIOGEL PI IND STRL 6 (GLOVE) IMPLANT
GLOVE BIOGEL PI IND STRL 6.5 (GLOVE) IMPLANT
GLOVE BIOGEL PI IND STRL 7.5 (GLOVE) IMPLANT
GLOVE BIOGEL PI INDICATOR 6 (GLOVE) ×1
GLOVE BIOGEL PI INDICATOR 6.5 (GLOVE) ×2
GLOVE BIOGEL PI INDICATOR 7.5 (GLOVE) ×2
GLOVE EUDERMIC 7 POWDERFREE (GLOVE) ×6 IMPLANT
GLOVE SURG SS PI 6.0 STRL IVOR (GLOVE) ×1 IMPLANT
GLOVE SURG SS PI 7.0 STRL IVOR (GLOVE) ×1 IMPLANT
GLOVE SURG SS PI 7.5 STRL IVOR (GLOVE) ×2 IMPLANT
GOWN PREVENTION PLUS XLARGE (GOWN DISPOSABLE) ×7 IMPLANT
GOWN STRL NON-REIN LRG LVL3 (GOWN DISPOSABLE) ×7 IMPLANT
GOWN STRL REIN XL XLG (GOWN DISPOSABLE) ×1 IMPLANT
KIT BASIN OR (CUSTOM PROCEDURE TRAY) ×3 IMPLANT
KIT ROOM TURNOVER OR (KITS) ×3 IMPLANT
KIT SUCTION CATH 14FR (SUCTIONS) ×3 IMPLANT
NS IRRIG 1000ML POUR BTL (IV SOLUTION) ×7 IMPLANT
PACK CHEST (CUSTOM PROCEDURE TRAY) ×3 IMPLANT
PAD ARMBOARD 7.5X6 YLW CONV (MISCELLANEOUS) ×6 IMPLANT
POUCH ENDO CATCH II 15MM (MISCELLANEOUS) IMPLANT
POUCH SPECIMEN RETRIEVAL 10MM (ENDOMECHANICALS) IMPLANT
SEALANT PROGEL (MISCELLANEOUS) IMPLANT
SEALANT SURG COSEAL 4ML (VASCULAR PRODUCTS) IMPLANT
SEALANT SURG COSEAL 8ML (VASCULAR PRODUCTS) IMPLANT
SOLUTION ANTI FOG 6CC (MISCELLANEOUS) ×5 IMPLANT
SPECIMEN JAR MEDIUM (MISCELLANEOUS) ×2 IMPLANT
SPONGE GAUZE 4X4 12PLY (GAUZE/BANDAGES/DRESSINGS) ×3 IMPLANT
SPONGE INTESTINAL PEANUT (DISPOSABLE) ×1 IMPLANT
SUT PROLENE 4 0 RB 1 (SUTURE)
SUT PROLENE 4-0 RB1 .5 CRCL 36 (SUTURE) IMPLANT
SUT SILK  1 MH (SUTURE) ×2
SUT SILK 1 MH (SUTURE) ×2 IMPLANT
SUT SILK 2 0SH CR/8 30 (SUTURE) IMPLANT
SUT SILK 3 0SH CR/8 30 (SUTURE) IMPLANT
SUT VIC AB 0 CTX 27 (SUTURE) IMPLANT
SUT VIC AB 1 CTX 27 (SUTURE) ×1 IMPLANT
SUT VIC AB 2-0 CT1 27 (SUTURE) ×3
SUT VIC AB 2-0 CT1 TAPERPNT 27 (SUTURE) IMPLANT
SUT VIC AB 2-0 CTX 36 (SUTURE) IMPLANT
SUT VIC AB 3-0 MH 27 (SUTURE) IMPLANT
SUT VIC AB 3-0 SH 27 (SUTURE)
SUT VIC AB 3-0 SH 27X BRD (SUTURE) IMPLANT
SUT VIC AB 3-0 X1 27 (SUTURE) ×5 IMPLANT
SUT VICRYL 0 UR6 27IN ABS (SUTURE) ×4 IMPLANT
SUT VICRYL 2 TP 1 (SUTURE) IMPLANT
SWAB COLLECTION DEVICE MRSA (MISCELLANEOUS) IMPLANT
SYSTEM SAHARA CHEST DRAIN ATS (WOUND CARE) ×3 IMPLANT
TAPE CLOTH SURG 4X10 WHT LF (GAUZE/BANDAGES/DRESSINGS) ×1 IMPLANT
TIP APPLICATOR SPRAY EXTEND 16 (VASCULAR PRODUCTS) IMPLANT
TOWEL OR 17X24 6PK STRL BLUE (TOWEL DISPOSABLE) ×3 IMPLANT
TOWEL OR 17X26 10 PK STRL BLUE (TOWEL DISPOSABLE) ×6 IMPLANT
TRAP SPECIMEN MUCOUS 40CC (MISCELLANEOUS) ×2 IMPLANT
TRAY FOLEY CATH 14FR (SET/KITS/TRAYS/PACK) ×3 IMPLANT
TUBE ANAEROBIC SPECIMEN COL (MISCELLANEOUS) IMPLANT
WATER STERILE IRR 1000ML POUR (IV SOLUTION) ×5 IMPLANT

## 2012-07-21 NOTE — Anesthesia Postprocedure Evaluation (Signed)
  Anesthesia Post-op Note  Patient: Christian [name]  Procedure(s) Performed: Procedure(s) (LRB) with comments: VIDEO ASSISTED THORACOSCOPY (VATS)/EMPYEMA (Left) - Left video assisted thorascopy for empyema and decortication  VIDEO BRONCHOSCOPY (Bilateral)  Patient Location: PACU  Anesthesia Type:General  Level of Consciousness: Patient remains intubated per anesthesia plan  Airway and Oxygen Therapy: Patient remains intubated per anesthesia plan  Post-op Pain: mild  Post-op Assessment: Post-op Vital signs reviewed  Post-op Vital Signs: Reviewed  Complications: No apparent anesthesia complications

## 2012-07-21 NOTE — Progress Notes (Signed)
PULMONARY  / CRITICAL CARE MEDICINE  Name: Christian [name] MRN: [id number] DOB: 04-Jun-1932    LOS: 2  REFERRING MD :  [facility] PCP: Kriste Basque CHIEF COMPLAINT:  Loculated left pleural effusion/empyema    BRIEF PATIENT DESCRIPTION:  78 yo male, PCP of [name], seen in office 12-17 with increased effusion. He was discharged from [facility] 11/25 to 12-10 to [facility] after treatment for Mrsa necrotizing pna.  He was evaluated in Dr. Jodelle Green office by Andrey Campanile ANP 12-17 and found to have increased left effusion. Admitted by [facility] and thoracentesis was performed but effusion is loculated on [place] - Fluid was cloudy and probably a loculated empyema. PCCM asked to evaluate and help with care 12-18.    Note he has known aspiration and is on swallow precautions. 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  LINES / TUBES:   CULTURES: 12-18 thora fluid>>cloudy, 45% PMNs 12-18 bcx2>> 12-18 uc>> 12/19 pleural fluid>>>  ANTIBIOTICS: 12-18 levaquin>> 12-18 vanc>> 12-18 maxipime>>  SIGNIFICANT EVENTS:  12-18 thoracentesis  LEVEL OF CARE:  sdu PRIMARY SERVICE:  [facility] CONSULTANTS:  PCCM CODE STATUS DIET:  Dys 3 DVT Px: LMWH GI Px:  PPI  HISTORY OF PRESENT ILLNESS:  78 yo male, PCP of [name], seen in office 12-17 with increased effusion. He was discharged from [facility] 12-10 to [facility] after treatment for Mrsa necrotizing pna.  He was evaluated in Dr. Jodelle Green office by Andrey Campanile ANP 12-17 and found to have increased left effusion.   INTERVAL HISTORY:  Now in PACU s/p VATS   VITAL SIGNS: Temp:  [96.1 F (35.6 C)-98.7 F (37.1 C)] 97.4 F (36.3 C) (12/19 1530) Pulse Rate:  [80-114] 93  (12/19 1600) Resp:  [14-35] 14  (12/19  1600) BP: (96-111)/(58-78) 102/61 mmHg (12/19 1600) SpO2:  [96 %-100 %] 100 % (12/19 1600) Arterial Line BP: (113-123)/(50-55) 123/53 mmHg (12/19 1600) FiO2 (%):  [50 %] 50 % (12/19 1600) HEMODYNAMICS:   VENTILATOR SETTINGS: Vent Mode:  [-] PRVC FiO2 (%):  [50 %] 50 % Set Rate:  [14 bmp] 14 bmp Vt Set:  [650 mL] 650 mL PEEP:  [5 cmH20] 5 cmH20 Plateau Pressure:  [26 cmH20] 26 cmH20 INTAKE / OUTPUT: Intake/Output      12/18 0701 - 12/19 0700 12/19 0701 - 12/20 0700   P.O. 420    I.V. (mL/kg) 220 (2.6) 2020 (23.9)   IV Piggyback 950 500   Total Intake(mL/kg) 1590 (18.8) 2520 (29.8)   Urine (mL/kg/hr) 1240 (0.6) 100 (0.1)   Blood  300   Total Output 1240 400   Net +350 +2120        Urine Occurrence 1 x    Stool Occurrence 3 x      PHYSICAL EXAMINATION: General:  EWM NAD,sedated on vent  Neuro: sedated HEENT:  No LAN, orally intubated  Cardiovascular:  HSIR Lungs:  Rhonchus on left , clear on rt, No airleak from Left CT. Dressing CD&I Abdomen:  +bs Musculoskeletal:  Intact Skin:  Warm + le edema   LABS: Cbc  Lab 07/20/12 1919 07/20/12 0340 07/19/12 1805  WBC 8.0 -- --  HGB 10.4* 10.7* 10.7*  HCT 32.1* 33.2* 32.0*  PLT 132* 120* 138*  Chemistry   Lab 07/20/12 1919 07/20/12 0340 07/19/12 1805  NA 132* 130* 132*  K 4.5 4.3 4.7  CL 98 96 98  CO2 26 27 27   BUN 44* 46* 52*  CREATININE 0.99 1.04 1.13  CALCIUM 8.8 8.5 8.8  MG -- -- --  PHOS -- -- --  GLUCOSE 153* 100* 112*    Liver fxn  Lab 07/20/12 1919  AST 16  ALT 17  ALKPHOS 117  BILITOT 1.0  PROT 5.6*  ALBUMIN 1.9*   coags  Lab 07/20/12 1919  APTT 36  INR 1.15   Sepsis markers No results found for this basename: LATICACIDVEN:3,PROCALCITON:3 in the last 168 hours Cardiac markers  Lab 07/19/12 1805  CKTOTAL --  CKMB --  TROPONINI <0.30   BNP  Lab 07/19/12 1805  PROBNP 2817.0*   ABG  Lab 07/20/12 1915  PHART 7.394  PCO2ART 42.4  PO2ART 89.6  HCO3 25.4*  TCO2 26.7     CBG trend No results found for this basename: GLUCAP:5 in the last 168 hours  IMAGING: Dg Chest 1 View  07/20/2012  *RADIOLOGY REPORT*  Clinical Data: Post left thoracentesis  CHEST - 1 VIEW  Comparison: 07/19/2012  Findings: Cardiac silhouette is obscured due to complete opacification of the left hemithorax by a large left pleural effusion with accompanying left lung atelectasis. No gross evidence of pneumothorax identified. Increased right basilar atelectasis versus consolidation. Associated right pleural effusion. No acute osseous findings.  IMPRESSION: Increased opacification of the left hemithorax by left pleural effusion and atelectasis versus consolidation of left lung. Increased right basilar consolidation with persistent right pleural effusion.   Original Report Authenticated By: Ulyses Southward, M.D.    Ct Chest Without Contrast  07/21/2012  *RADIOLOGY REPORT*  Clinical Data: Postop evaluation of empyema  CT CHEST WITHOUT CONTRAST  Technique:  Multidetector CT imaging of the chest was performed following the standard protocol without IV contrast.  Comparison: Thorax 11/30 1013  Findings: Exam is degraded by patient respirator motion and.  The left hemithorax is completely opacified by pleural fluid which has simple fluid density.  The right upper lobe and right lower lobe are collapsed centrally towards the hilum.  There is a much smaller pleural effusion on the right with mild right basilar consolidation (image 41) improved from prior.  The right upper lobe is clear.  The right mainstem bronchus appears normal.  The distal left main stem bronchus (at the level of the lingular bronchus) contains an endoluminal plug  measuring 2.1 x 1.4  centimeters (image 25). This could represent a endobronchial neoplasm versus mucous plugging or infection.  The heart appears normal without pericardial fluid.   There is valvular calcifications.  Prior sternotomy.  Review of the upper abdomen is unremarkable.   Review the skeleton demonstrates degenerative change.  IMPRESSION:  1.  Filling defect within the distal left main stem bronchus representing either endobronchial neoplasm versus mucous plugging. Consider bronchoscopy for evaluation.  2.  Complete filling of the left hemithorax with pleural fluid and atelectatic lung which contracts centrally toward the hilum. 3.  Small right effusion and right lower lobe consolidation.  The effusion is new while the consolidation is improved.   Original Report Authenticated By: Genevive Bi, M.D.    Dg Chest Portable 1 View  07/21/2012  *RADIOLOGY REPORT*  Clinical Data: Thoracotomy and chest tube insertion  PORTABLE CHEST - 1 VIEW  Comparison: CT 07/21/2012  Findings: Two chest tubes on the left with drainage of a large  left effusion.  There remains left pleural effusion and collapse of the left lower lobe.  Probable left apical pneumothorax.  Right lower lobe consolidation is slightly improved. Vascular congestion with probable edema has progressed on the right. Question cavitary lesion in the right lateral lung base versus a loculated hydropneumothorax.  This was not present previously.  Endotracheal tube in good position.  Right jugular catheter tip in the right innominate vein.  IMPRESSION: Partial drainage of large left effusion.  Two chest tubes on the left.  Probable left apical pneumothorax.  Vascular congestion with probable edema in the right lung.  Right pleural effusion.  15 mm air-filled cavity in the right lung base may represent hydro pneumothorax or a cavitary lesion.  This was not present on the recent CT.   Original Report Authenticated By: Janeece Riggers, M.D.    US Thoracentesis Asp Pleural Space W/img Guide  07/21/2012  *RADIOLOGY REPORT*  Clinical Data:  Loculated left pleural effusion  ULTRASOUND GUIDED left THORACENTESIS  Comparison:  None  An ultrasound guided thoracentesis was thoroughly discussed with the patient and questions answered.  The  benefits, risks, alternatives and complications were also discussed.  The patient understands and wishes to proceed with the procedure.  Written consent was obtained.  Ultrasound was performed to localize and mark an adequate pocket of fluid in the left chest.  The area was then prepped and draped in the normal sterile fashion.  1% Lidocaine was used for local anesthesia.  Under ultrasound guidance a 19 gauge Yueh catheter was introduced.  Thoracentesis was performed.  The catheter was removed and a dressing applied.  Complications:  None  Findings: A total of approximately 25 ml of bloody fluid was removed. A fluid sample was sent for laboratory analysis.  IMPRESSION: Successful ultrasound guided left thoracentesis yielding 25 ml of pleural fluid. Effusion very loculated.  Informed ordering MD.  Was asked to proceed to gain as much fluid as possible and send for tests.  The patient's blood pressure was also low pre and during procedure. He was asymptomatic.  Post procedure blood pressure was 75/48.  Read by: Ralene Muskrat, P.A.-C   Original Report Authenticated By: Malachy Moan, M.D.     ECG:  DIAGNOSES: Active Problems:  ESSENTIAL HYPERTENSION  COPD (chronic obstructive pulmonary disease)  CAD (coronary artery disease)  Necrotizing pneumonia, community acquired MRSA  Dysphagia, pharyngeal  Pleural effusion  Atrial fibrillation  Pedal edema  Empyema of left pleural space   ASSESSMENT / PLAN:  PULMONARY  ASSESSMENT: Recent MRSA necrotizing pna with DC to [facility] 12-10 and readmit 12-17 with complete opacification of left lung. US revealed  Loculation. Now S/P VATs decortication for empyema on 12/19  Post-op respiratory failure in setting of atelectasis and mucous plugging Spoke w/ thoracics, they would like to keep on vent overnight to assist w/ lung recruitment.  PLAN:   -f/u cxr in am -abg now and in am CT and post-op care per surgery team -will assess in am 12/20 for weaning    CARDIOVASCULAR  ASSESSMENT:  Hx of Afib AS CAD HTN Valvular DZ Followed by [facility], currently hemodynamically stable  PLAN:  -cont bb -keep even vol status for now Tele monitor in ICU   RENAL ASSESSMENT:   Mild hyponatremia. Volume status appears improved after stopping lasix  PLAN:   Cont IVFs F/u chemistry   GASTROINTESTINAL  ASSESSMENT:   GERD Aspiration PLAN:   -PPI -Dys 3 diet  HEMATOLOGIC  ASSESSMENT:   Hx of  MM PLAN:  -monitor  INFECTIOUS  ASSESSMENT:   Recent MRSA Pna/ empyema PLAN:   Cont current abx Will narrow after culture data completed.     NEUROLOGIC  ASSESSMENT:   Acute encephalopathy due to metabolic acidosis.  Now sedated s/p VATS.  PLAN:   -Monitor  CLINICAL SUMMARY: 78 yo with opacified left lung with recent MRSA necrotizing pna. Now s/p vats. Will keep on vent over night due to significant atelectasis.   BABCOCK,PETE  CC time 35 min.  Patient seen and examined, agree with above note.  I dictated the care and orders written for this patient under my direction.  Koren Bound, M.D. [phone]

## 2012-07-21 NOTE — Anesthesia Preprocedure Evaluation (Signed)
Anesthesia Evaluation  Patient identified by MRN, date of birth, ID band Patient awake    Reviewed: Allergy & Precautions, H&P , NPO status , Patient's Chart, lab work & pertinent test results  Airway Mallampati: II      Dental   Pulmonary shortness of breath, pneumonia -, COPD breath sounds clear to auscultation        Cardiovascular hypertension, + CAD and + Peripheral Vascular Disease + dysrhythmias Rhythm:Regular Rate:Normal     Neuro/Psych    GI/Hepatic Neg liver ROS, GERD-  ,  Endo/Other  negative endocrine ROS  Renal/GU negative Renal ROS     Musculoskeletal   Abdominal   Peds  Hematology   Anesthesia Other Findings   Reproductive/Obstetrics                           Anesthesia Physical Anesthesia Plan  ASA: IV  Anesthesia Plan: General   Post-op Pain Management:    Induction: Intravenous  Airway Management Planned: Oral ETT  Additional Equipment: CVP and Arterial line  Intra-op Plan:   Post-operative Plan:   Informed Consent:   Dental advisory given  Plan Discussed with: CRNA, Anesthesiologist and Surgeon  Anesthesia Plan Comments:         Anesthesia Quick Evaluation

## 2012-07-21 NOTE — Anesthesia Procedure Notes (Signed)
Procedures RIJ Dual Lumen CVP: 1115-1130: The patient was identified and consent obtained.  TO was performed, and full barrier precautions were used.  The skin was anesthetized with lidocaine.  Once the vein was located with the 22 ga. needle using ultrasound guidance , the wire was inserted into the vein.  The wire location was confirmed with ultrasound.  The tissue was dilated and the catheter was carefully inserted, then sutured in place. A dressing was applied. The patient tolerated the procedure well.  CE

## 2012-07-21 NOTE — Progress Notes (Signed)
Clinical Child psychotherapist (CSW) informed pt from [facility] (location not confirmed.) CSW visited pt room to assess however pt asked for CSW to follow up with his daughter about discharge plans. Pt daughter not in pt room and pt headed to have surgery. CSW to follow up after surgery.  Theresia Bough, MSW, Theresia Majors [phone]

## 2012-07-21 NOTE — Progress Notes (Signed)
Third attempt to insert NG tube not successful, pt "clamping down" on during insertion, bp drops to 70's systolic, will advise oncoming shift, NG may have to be placed under fluoroscopy. Discussed earlier with E-MD, will continue to monitor patient closely.

## 2012-07-21 NOTE — Transfer of Care (Signed)
Immediate Anesthesia Transfer of Care Note  Patient: Christian [name]  Procedure(s) Performed: Procedure(s) (LRB) with comments: VIDEO ASSISTED THORACOSCOPY (VATS)/EMPYEMA (Left) - Left video assisted thorascopy for empyema and decortication  VIDEO BRONCHOSCOPY (Bilateral)  Patient Location: PACU  Anesthesia Type:General  Level of Consciousness: Patient remains intubated per anesthesia plan  Airway & Oxygen Therapy: Patient remains intubated per anesthesia plan and Patient placed on Ventilator (see vital sign flow sheet for setting)  Post-op Assessment: Report given to PACU RN and Post -op Vital signs reviewed and stable  Post vital signs: Reviewed and stable  Complications: No apparent anesthesia complications

## 2012-07-21 NOTE — Addendum Note (Signed)
Addendum  created 07/21/12 1721 by Zach Tietje Edwards, MD   Modules edited:Anesthesia Attestations, Anesthesia Blocks and Procedures, Inpatient Notes    

## 2012-07-21 NOTE — OR Nursing (Signed)
Patient denies allergy to betadine and iodine.

## 2012-07-21 NOTE — Brief Op Note (Signed)
07/19/2012 - 07/21/2012  2:40 PM  PATIENT:  Christian [name]  78 y.o. male  PRE-OPERATIVE DIAGNOSIS:  empyema  POST-OPERATIVE DIAGNOSIS:  empyema  PROCEDURE:  Procedure(s) (LRB) with comments: VIDEO ASSISTED THORACOSCOPY (VATS)/EMPYEMA (Left) - Left video assisted thorascopy for empyema and decortication  VIDEO BRONCHOSCOPY (Bilateral)  SURGEON:  Surgeon(s) and Role:    * Loreli Slot, MD - Primary  ANESTHESIA:   general  EBL:  Total I/O In: 1520 [I.V.:1020; IV Piggyback:500] Out: 400 [Urine:100; Blood:300]  BLOOD ADMINISTERED:none  DRAINS: 2 Chest Tube(s) in the left pleura   SPECIMEN:  Source of Specimen:  Bronchial, pleural fluid  DISPOSITION OF SPECIMEN:  PATH & MICRO  COUNTS:  YES  PLAN OF CARE: Admit to inpatient   PATIENT DISPOSITION:  ICU - intubated and hemodynamically stable.   Delay start of Pharmacological VTE agent (>24hrs) due to surgical blood loss or risk of bleeding: yes

## 2012-07-21 NOTE — OR Nursing (Signed)
Procedure 1 Bronchoscopy start at 1210 end at 1219. Procedure 2 Video assisted thoracoscopy for empyema and decortication start at 1250 end at 1355.

## 2012-07-21 NOTE — Preoperative (Signed)
Beta Blockers   Reason not to administer Beta Blockers:Not Applicable 

## 2012-07-21 NOTE — H&P (Signed)
Christian [name]  [facility] Medical Record #[id number]  Date of Birth: September 13, 1931  Referring: Dr Vassie Loll  Primary Care: Michele Mcalpine, MD  Chief Complaint:  Chief Complaint   Patient presents with   .  Shortness of Breath    History of Present Illness:  Patient is 78 year old gentleman just after Thanksgiving was was admitted to [facility] with COPD exacerbation and with necrotizing community-acquired MRSA pneumonia. The patient aspirated during his inpatient stay, swallow study showed silent aspiration. He had a short stay in the ICU. The patient was discharged approximately week ago to [facility]. Since discharge he has received his antibiotics, he has remained short of breath, he was oxygen dependent and hypoxic at the time of discharge. Yesterday he had a follow up in Pulmonary office , chest x-ray revealed possible worsening pneumonia and increasing left pleural effusion. He was sent to the ER to be evaluated and was admitted to [facility]. Attempt at thoracentesis was not successful, chest xray today shows white out of left pleural space.  He has previous history of mass resected from left chest via sternotomy by Dr Andrey Campanile in 1980 ? Melanoma. He notes left diaphragm was injured at that time  Current Activity/ Functional Status:  Patient is not independent with mobility/ambulation, transfers, ADL's, IADL's. Since recent admission was living at [facility] nursing home  Past Medical History   Diagnosis  Date   .  Allergic rhinitis    .  COPD (chronic obstructive pulmonary disease)    .  Disorders of diaphragm    .  Acute bronchitis    .  Hypertension    .  CAD (coronary artery disease)      Catheterization 2009, mild coronary disease (after abnormal nuclear study,With hypotensive response to exercise, 2009)   .  RBBB    .  Atrial flutter      Ablated in the past, no recurrence   .  Venous insufficiency    .  Edema    .  Dyslipidemia      Patient has an and to use statin   .  GERD  (gastroesophageal reflux disease)    .  Diverticulosis of colon    .  Hypertrophy of prostate with urinary obstruction and other lower urinary tract symptoms (LUTS)    .  Increased prostate specific antigen (PSA) velocity    .  DJD (degenerative joint disease)    .  Chronic insomnia    .  Malignant melanoma      Removed from mediastinum via thoracotomy July, 2010   .  Ejection fraction      EF 60%,Echo, 2009, / EF 55-60%, echo, February, 2013   .  Aortic stenosis      Mild, echo and catheterization, 2009 / Mild, echo, February, 2013   .  Mitral stenosis      Very mild, echo, from mitral annular calcification   .  Shortness of breath      Episodes of feeling shortness of breath with some mild dizziness with mild exertion., February, 2013   .  Tricuspid regurgitation      Moderate, echo, February, 2013, 56 mmHg   .  Pulmonary hypertension      56 mmHg, echo, February, 2013   .  Pneumonia     Past Surgical History   Procedure  Date   .  Bilateral inguinal hernia repair    .  Umbilical hernia repair    .  Median sternotomy for mediastinal melanoma  1980s    History reviewed. No pertinent family history.  History    Social History   .  Marital Status:  Married     Spouse Name:  evelyn Blaylock     Number of Children:  6   .  Years of Education:  N/A    Occupational History   .  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  Smoking status:  Former Smoker     Quit date:  08/03/1960   .  Smokeless tobacco:  Never Used   .  Alcohol Use:  No   .  Drug Use:  No    Social History Narrative    2 biological children and 4 adopted children    History   Smoking status   .  Former Smoker   .  Quit date:  08/03/1960   Smokeless tobacco   .  Never Used    History   Alcohol Use  No    Allergies   Allergen  Reactions   .  Ivp Dye (Iodinated Diagnostic Agents)     Current Facility-Administered Medications   Medication  Dose  Route  Frequency  Provider  Last  Rate  Last Dose   .  0.9 % sodium chloride infusion   Intravenous  Continuous  Roma Kayser Schorr, NP  10 mL/hr at 07/20/12 0304    .  acetaminophen (TYLENOL) tablet 650 mg  650 mg  Oral  Q6H PRN  Debby Crosley, MD      Or   .  acetaminophen (TYLENOL) suppository 650 mg  650 mg  Rectal  Q6H PRN  Debby Crosley, MD     .  alum & mag hydroxide-simeth (MAALOX/MYLANTA) 200-200-20 MG/5ML suspension 30 mL  30 mL  Oral  Q6H PRN  Debby Crosley, MD     .  ceFEPIme (MAXIPIME) 1 g in dextrose 5 % 50 mL IVPB  1 g  Intravenous  Q12H  Thuyvan Thi Phan, PHARMD   1 g at 07/20/12 0900   .  dutasteride (AVODART) capsule 0.5 mg  0.5 mg  Oral  Daily  Debby Crosley, MD   0.5 mg at 07/20/12 0934   .  enoxaparin (LOVENOX) injection 30 mg  30 mg  Subcutaneous  QHS  Debby Crosley, MD   30 mg at 07/20/12 0000   .  guaiFENesin (MUCINEX) 12 hr tablet 600 mg  600 mg  Oral  BID  Osvaldo Shipper, MD   600 mg at 07/20/12 0934   .  guaiFENesin-dextromethorphan (ROBITUSSIN DM) 100-10 MG/5ML syrup 5 mL  5 mL  Oral  Q4H PRN  Debby Crosley, MD     .  levalbuterol (XOPENEX) nebulizer solution 0.63 mg  0.63 mg  Nebulization  Q6H PRN  Osvaldo Shipper, MD     .  levofloxacin (LEVAQUIN) IVPB 750 mg  750 mg  Intravenous  Q24H  Thuyvan Thi Phan, PHARMD     .  metoprolol (LOPRESSOR) tablet 50 mg  50 mg  Oral  BID  Osvaldo Shipper, MD   50 mg at 07/20/12 0934   .  oxyCODONE (Oxy IR/ROXICODONE) immediate release tablet 5 mg  5 mg  Oral  Q4H PRN  Gery Pray, MD   5 mg at 07/19/12 2359   .  pantoprazole (PROTONIX) EC tablet 40 mg  40 mg  Oral  BID AC  Debby Crosley, MD   40 mg at 07/20/12 0934   .  polyethylene glycol (MIRALAX / GLYCOLAX) packet 17 g  17 g  Oral  Daily PRN  Debby Crosley, MD     .  saccharomyces boulardii (FLORASTOR) capsule 250 mg  250 mg  Oral  BID  Osvaldo Shipper, MD   250 mg at 07/20/12 1400   .  sodium chloride 0.9 % injection 3 mL  3 mL  Intravenous  PRN  Debby Crosley, MD     .  Tamsulosin HCl (FLOMAX) capsule 0.4 mg  0.4  mg  Oral  BID  Debby Crosley, MD   0.4 mg at 07/20/12 0934   .  vancomycin (VANCOCIN) IVPB 1000 mg/200 mL premix  1,000 mg  Intravenous  Q12H  Thuyvan Stacey Drain, PHARMD      Prescriptions prior to admission   Medication  Sig  Dispense  Refill   .  alum & mag hydroxide-simeth (MAALOX/MYLANTA) 200-200-20 MG/5ML suspension  Take 30 mLs by mouth every 6 (six) hours as needed. Stomach discomfort.     .  Alum & Mag Hydroxide-Simeth (MAGIC MOUTHWASH) SOLN  Take 5 mLs by mouth 4 (four) times daily. Swish and swallow.     Marland Kitchen  amoxicillin-clavulanate (AUGMENTIN) 875-125 MG per tablet  Take 1 tablet by mouth 2 (two) times daily. X 4 weeks, stop date Jan 6th.  60 tablet  0   .  AVODART 0.5 MG capsule  TAKE 1 CAPSULE EVERY DAY  30 capsule  10   .  etodolac (LODINE XL) 400 MG 24 hr tablet  TAKE 1 TABLET (400 MG TOTAL) BY MOUTH DAILY.  60 tablet  5   .  guaiFENesin-dextromethorphan (ROBITUSSIN DM) 100-10 MG/5ML syrup  Take 5 mLs by mouth every 4 (four) hours as needed for cough.  118 mL    .  ipratropium (ATROVENT) 0.02 % nebulizer solution  Take 2.5 mLs (0.5 mg total) by nebulization 3 (three) times daily.  75 mL    .  levalbuterol (XOPENEX) 0.63 MG/3ML nebulizer solution  Take 3 mLs (0.63 mg total) by nebulization 3 (three) times daily.  3 mL    .  linezolid (ZYVOX) 600 MG tablet  Take 1 tablet (600 mg total) by mouth every 12 (twelve) hours. For 4 weeks, stop date Jan 6th, 2014  60 tablet  0   .  oxyCODONE (OXY IR/ROXICODONE) 5 MG immediate release tablet  Take 5 mg by mouth every 4 (four) hours as needed.     .  pantoprazole (PROTONIX) 40 MG tablet  Take 1 tablet (40 mg total) by mouth 2 (two) times daily before a meal.     .  polyethylene glycol (MIRALAX / GLYCOLAX) packet  Take 17 g by mouth daily as needed (constipation).  14 each    .  Tamsulosin HCl (FLOMAX) 0.4 MG CAPS  Take 0.4 mg by mouth 2 (two) times daily.     .  trospium (SANCTURA) 20 MG tablet  Take 1 tablet (20 mg total) by mouth 2 (two) times  daily.  60 tablet  0    Review of Systems:  Cardiac Review of Systems: Y or N  Chest Pain [ ]  Resting SOB [ y ] Exertional SOB [ y ] [place].Etienne ]  Pedal Edema [yes increasing ] Palpitations Milo.Brash ] Syncope [ n ] Presyncope [ n ]  General Review of Systems: [Y] = yes [ ] =no  Constitional: recent weight change [ y ]; anorexia [ y ]; fatigue [ y ]; nausea [ ] ; night sweats [  y ]; fever [ m ]; or chills [ ] ;  Dental: poor dentition[n ];  Eye : blurred vision [ ] ; diplopia [ ] ; vision changes [ ] ; Amaurosis fugax[ ] ;  Resp: cough [ ] ; wheezing[ ] ; hemoptysis[ ] ; shortness of breath[ ] ; paroxysmal nocturnal dyspnea[ ] ; dyspnea on exertion[ ] ; or orthopnea[ ] ;  GI: gallstones[ ] , vomiting[ ] ; dysphagia[ ] ; melena[ ] ; hematochezia [ ] ; heartburn[ ] ; Hx of Colonoscopy[ ] ;  GU: kidney stones [ ] ; hematuria[ ] ; dysuria [ ] ; nocturia[ ] ; history of obstruction [ ] ;  Skin: rash, swelling[ ] ;, hair loss[ ] ; peripheral edema[ ] ; or itching[ ] ;  Musculosketetal: myalgias[ ] ; joint swelling[ ] ; joint erythema[ ] ;  joint pain[ ] ; back pain[ ] ;  Heme/Lymph: bruising[ ] ; bleeding[ n ]; anemia[ ] ;  Neuro: TIA[ ] ; headaches[ ] ; stroke[ ] ; vertigo[ ] ; seizures[ ] ; paresthesias[ ] ; difficulty walking[ y ];  Psych:depression[ ] ; anxiety[ ] ;  Endocrine: diabetes[ ] ; thyroid dysfunction[ ] ;  Immunizations: Flu [ y]; Pneumococcal[ y ];  Other:  Physical Exam:  BP 107/62  Pulse 87  Temp 97.9 F (36.6 C) (Oral)  Resp 28  Ht 6' 0.05" (1.83 m)  Wt 186 lb 8.2 oz (84.6 kg)  BMI 25.26 kg/m2  SpO2 100%  General appearance: alert, cooperative, appears older than stated age, flushed and mild distress  Neurologic: intact  Heart: normal apical impulse, irregularly irregular rhythm, no click and no rub  Lungs: diminished breath sounds LLL and LUL  Abdomen: soft, non-tender; bowel sounds normal; no masses, no organomegaly  Extremities: edema 2-3 + edema lower extremities and Homans sign is negative, no sign of DVT   Diagnostic Studies & Laboratory data:  Recent Radiology Findings:  Dg Chest 1 View  07/20/2012 *RADIOLOGY REPORT* Clinical Data: Post left thoracentesis CHEST - 1 VIEW Comparison: 07/19/2012 Findings: Cardiac silhouette is obscured due to complete opacification of the left hemithorax by a large left pleural effusion with accompanying left lung atelectasis. No gross evidence of pneumothorax identified. Increased right basilar atelectasis versus consolidation. Associated right pleural effusion. No acute osseous findings. IMPRESSION: Increased opacification of the left hemithorax by left pleural effusion and atelectasis versus consolidation of left lung. Increased right basilar consolidation with persistent right pleural effusion. Original Report Authenticated By: Ulyses Southward, M.D.  Dg Chest 2 View  07/19/2012 *RADIOLOGY REPORT* Clinical Data: Pneumonia. Cough, shortness of breath and chest pain. CHEST - 2 VIEW Comparison: Plain films of the chest 07/06/2012. CT chest 07/02/2012. Findings: There has been marked increase in the patient's left pleural effusion. Smaller right pleural effusion has also increased. Extensive airspace disease in the left chest has worsened. Right basilar airspace opacity does not appear notably changed. Cardiac silhouette is obscured. The patient is status post CABG. IMPRESSION: 1. Increased left much larger than right pleural effusions. 2. Extensive left side airspace disease has worsened. Right basilar airspace opacity is unchanged. Original Report Authenticated By: Holley Dexter, M.D.   Recent Lab Findings:  Lab Results   Component  Value  Date    WBC  7.9  07/20/2012    HGB  10.7*  07/20/2012    HCT  33.2*  07/20/2012    PLT  120*  07/20/2012    GLUCOSE  100*  07/20/2012    CHOL  135  06/27/2012    TRIG  40  06/27/2012    HDL  51  06/27/2012    LDLCALC  76  06/27/2012    ALT  17  06/28/2012    AST  27  06/28/2012    NA  130*  07/20/2012    K  4.3  07/20/2012     CL  96  07/20/2012    CREATININE  1.04  07/20/2012    BUN  46*  07/20/2012    CO2  27  07/20/2012    TSH  3.13  05/19/2011    INR  1.0 RATIO  01/12/2008    HGBA1C  5.6  07/02/2012    Assessment / Plan:  Necrotizing pneumonia, community acquired MRSA  Rapidly increasing left Pleural effusion consistent with empyema  Dysphagia, pharyngeal  Atrial fibrillation  Hypertension  COPD (chronic obstructive pulmonary disease)  CAD (coronary artery disease) mild AS  I have seen and examined the patient, will get repeat ct to access the extent pulmonary disease, I have recommended Bronch, Left VATS/ drainage of effusion and decortcation.  Dr Dorris Fetch has OR time tomorrow late morning and can possibly proceed with surgical drainage.  The goals risks and alternatives of the planned surgical procedure Bronch, left VATS have been discussed with the patient in detail and with his daughter and son. The risks of the procedure including death, infection, stroke, myocardial infarction, bleeding, blood transfusion have all been discussed specifically. I have quoted Marzella Schlein a 10 % of perioperative mortality and a complication rate as high as 25 %. The patient's questions have been answered.CAPERS HAGMANN is willing to proceed with the planned procedure.  Delight Ovens MD  Beeper [phone]  Office [phone]  07/20/2012 6:06 PM    This is Dr. Dennie Maizes comprehensive consult note from yesterday. He has asked me to do bronchoscopy, left VATS today for treatment of the large left pleural effusion. I have reviewed his chart, examined the patient and reviewed the CT from this morning. I agree with the plan outlined by Dr. Tyrone Sage.I have discussed the procedure with Mr. Billy and he understands the indications, risks, benefits and alternatives. We will proceed as planned  CT Chest 07/21/12  *RADIOLOGY REPORT*  Clinical Data: Postop evaluation of empyema  CT CHEST WITHOUT CONTRAST  Technique:  Multidetector CT imaging of the chest was performed  following the standard protocol without IV contrast.  Comparison: Thorax 11/30 1013  Findings: Exam is degraded by patient respirator motion and.  The left hemithorax is completely opacified by pleural fluid which  has simple fluid density. The right upper lobe and right lower  lobe are collapsed centrally towards the hilum. There is a much  smaller pleural effusion on the right with mild right basilar  consolidation (image 41) improved from prior. The right upper  lobe is clear.  The right mainstem bronchus appears normal. The distal left main  stem bronchus (at the level of the lingular bronchus) contains an  endoluminal plug measuring 2.1 x 1.4 centimeters (image 25).  This could represent a endobronchial neoplasm versus mucous  plugging or infection.  The heart appears normal without pericardial fluid. There is  valvular calcifications. Prior sternotomy.  Review of the upper abdomen is unremarkable. Review the skeleton  demonstrates degenerative change.  IMPRESSION:  1. Filling defect within the distal left main stem bronchus  representing either endobronchial neoplasm versus mucous plugging.  Consider bronchoscopy for evaluation.  2. Complete filling of the left hemithorax with pleural fluid and  atelectatic lung which contracts centrally toward the hilum.  3. Small right effusion and right lower lobe consolidation. The  effusion is new while the consolidation is improved.

## 2012-07-21 NOTE — Progress Notes (Signed)
Patient ID: Christian [name], male   DOB: [birth year], 78 y.o.   MRN: [id number]   Hemodynamically stable  Sedated on vent but awakens and follows commands  CT output decreased. It is serosanguinous.  Urine output 20/hr.

## 2012-07-21 NOTE — Addendum Note (Signed)
Addendum  created 07/21/12 1721 by Judie Petit, MD   Modules edited:Anesthesia Attestations, Anesthesia Blocks and Procedures, Inpatient Notes

## 2012-07-22 ENCOUNTER — Inpatient Hospital Stay (HOSPITAL_COMMUNITY): Payer: Medicare Other

## 2012-07-22 DIAGNOSIS — J95821 Acute postprocedural respiratory failure: Secondary | ICD-10-CM | POA: Diagnosis present

## 2012-07-22 DIAGNOSIS — J9620 Acute and chronic respiratory failure, unspecified whether with hypoxia or hypercapnia: Secondary | ICD-10-CM

## 2012-07-22 DIAGNOSIS — R0902 Hypoxemia: Secondary | ICD-10-CM

## 2012-07-22 LAB — CBC
HCT: 23.5 % — ABNORMAL LOW (ref 39.0–52.0)
Platelets: 99 10*3/uL — ABNORMAL LOW (ref 150–400)
RDW: 12.9 % (ref 11.5–15.5)
WBC: 4.4 10*3/uL (ref 4.0–10.5)

## 2012-07-22 LAB — POCT I-STAT 3, ART BLOOD GAS (G3+)
Acid-base deficit: 1 mmol/L (ref 0.0–2.0)
Bicarbonate: 25.1 mEq/L — ABNORMAL HIGH (ref 20.0–24.0)
Patient temperature: 98
Patient temperature: 98
pH, Arterial: 7.597 — ABNORMAL HIGH (ref 7.350–7.450)

## 2012-07-22 LAB — BASIC METABOLIC PANEL
Calcium: 8 mg/dL — ABNORMAL LOW (ref 8.4–10.5)
Chloride: 101 mEq/L (ref 96–112)
Creatinine, Ser: 1.09 mg/dL (ref 0.50–1.35)
GFR calc Af Amer: 73 mL/min — ABNORMAL LOW (ref >90)
GFR calc non Af Amer: 63 mL/min — ABNORMAL LOW (ref >90)

## 2012-07-22 LAB — PHOSPHORUS: Phosphorus: 2.2 mg/dL — ABNORMAL LOW (ref 2.3–4.6)

## 2012-07-22 LAB — POCT I-STAT 7, (LYTES, BLD GAS, ICA,H+H)
Bicarbonate: 26.1 mEq/L — ABNORMAL HIGH (ref 20.0–24.0)
Calcium, Ion: 1.19 mmol/L (ref 1.13–1.30)
HCT: 28 % — ABNORMAL LOW (ref 39.0–52.0)
Hemoglobin: 9.5 g/dL — ABNORMAL LOW (ref 13.0–17.0)
Sodium: 135 mEq/L (ref 135–145)
pH, Arterial: 7.306 — ABNORMAL LOW (ref 7.350–7.450)

## 2012-07-22 LAB — MAGNESIUM: Magnesium: 1.9 mg/dL (ref 1.5–2.5)

## 2012-07-22 LAB — GLUCOSE, CAPILLARY
Glucose-Capillary: 128 mg/dL — ABNORMAL HIGH (ref 70–99)
Glucose-Capillary: 96 mg/dL (ref 70–99)
Glucose-Capillary: 83 mg/dL (ref 70–99)

## 2012-07-22 LAB — PREPARE RBC (CROSSMATCH)

## 2012-07-22 MED ORDER — DEXTROSE 5 % IV SOLN
30.0000 ug/min | INTRAVENOUS | Status: DC
  Administered 2012-07-22: 30 ug/min via INTRAVENOUS
  Filled 2012-07-22: qty 2

## 2012-07-22 MED ORDER — SODIUM PHOSPHATE 3 MMOLE/ML IV SOLN: 20.0000 mmol | Freq: Once | INTRAVENOUS | Status: DC

## 2012-07-22 MED ORDER — DEXTROSE 5 % IV SOLN
1.0000 g | Freq: Three times a day (TID) | INTRAVENOUS | Status: DC
  Administered 2012-07-22 – 2012-07-25 (×10): 1 g via INTRAVENOUS
  Filled 2012-07-22 (×11): qty 1

## 2012-07-22 MED ORDER — ENOXAPARIN SODIUM 40 MG/0.4ML ~~LOC~~ SOLN
40.0000 mg | SUBCUTANEOUS | Status: DC
  Administered 2012-07-23 – 2012-07-29 (×8): 40 mg via SUBCUTANEOUS
  Filled 2012-07-22 (×9): qty 0.4

## 2012-07-22 MED ORDER — SODIUM CHLORIDE 0.9 % IJ SOLN
10.0000 mL | INTRAMUSCULAR | Status: DC | PRN
  Administered 2012-08-05 (×2): 20 mL
  Administered 2012-08-07 – 2012-08-08 (×4): 10 mL
  Filled 2012-07-22: qty 20
  Filled 2012-07-22 (×3): qty 10

## 2012-07-22 MED ORDER — FENTANYL CITRATE 0.05 MG/ML IJ SOLN
12.5000 ug | INTRAMUSCULAR | Status: DC | PRN
  Administered 2012-07-23 (×4): 25 ug via INTRAVENOUS

## 2012-07-22 MED ORDER — STARCH (THICKENING) PO POWD
ORAL | Status: DC | PRN
  Filled 2012-07-22: qty 227

## 2012-07-22 MED ORDER — SODIUM CHLORIDE 0.9 % IJ SOLN
10.0000 mL | Freq: Two times a day (BID) | INTRAMUSCULAR | Status: DC
  Administered 2012-07-22 – 2012-07-24 (×2): 20 mL
  Administered 2012-07-24: 10 mL
  Administered 2012-07-25: 20 mL
  Administered 2012-07-25 – 2012-07-31 (×12): 10 mL
  Administered 2012-08-01: 20 mL
  Administered 2012-08-01 – 2012-08-06 (×7): 10 mL
  Filled 2012-07-22: qty 20
  Filled 2012-07-22 (×4): qty 10
  Filled 2012-07-22: qty 20

## 2012-07-22 MED ORDER — SODIUM CHLORIDE 0.9 % IV SOLN: INTRAVENOUS | Status: DC

## 2012-07-22 MED ORDER — SODIUM GLYCEROPHOSPHATE 1 MMOLE/ML IV SOLN
20.0000 mmol | Freq: Once | INTRAVENOUS | Status: AC
  Administered 2012-07-22: 20 mmol via INTRAVENOUS
  Filled 2012-07-22: qty 20

## 2012-07-22 MED ORDER — FUROSEMIDE 10 MG/ML IJ SOLN
40.0000 mg | Freq: Once | INTRAMUSCULAR | Status: AC
  Administered 2012-07-22: 40 mg via INTRAVENOUS
  Filled 2012-07-22: qty 4

## 2012-07-22 NOTE — Progress Notes (Signed)
Spoke with Dr. Darrick Penna contacted contacted concerning ABG, orders received, respiratory notified of changes to be made and new vent orders.

## 2012-07-22 NOTE — Progress Notes (Signed)
1 Day Post-Op Procedure(s) (LRB): VIDEO ASSISTED THORACOSCOPY (VATS)/EMPYEMA (Left) VIDEO BRONCHOSCOPY (Bilateral) Subjective: VATS for PNTX, blebs Doing well  Objective: Vital signs in last 24 hours: Temp:  [97.7 F (36.5 C)-98.8 F (37.1 C)] 97.7 F (36.5 C) (12/20 1650) Pulse Rate:  [83-119] 101  (12/20 1500) Cardiac Rhythm:  [-] Atrial fibrillation (12/20 0800) Resp:  [11-37] 20  (12/20 1500) BP: (69-127)/(41-64) 92/47 mmHg (12/20 1500) SpO2:  [95 %-100 %] 100 % (12/20 1500) Arterial Line BP: (72-141)/(40-70) 109/47 mmHg (12/20 1500) FiO2 (%):  [30 %-50.2 %] 30 % (12/20 0930)  Hemodynamic parameters for last 24 hours: CVP:  [19 mmHg-23 mmHg] 19 mmHg  Intake/Output from previous day: 12/19 0701 - 12/20 0700 In: 4300.3 [I.V.:3082.3; IV Piggyback:1218] Out: 1825 [Urine:715; Blood:300; Chest Tube:810] Intake/Output this shift:    Min chest tube drainage  Lab Results:  [facility] 07/22/12 0330 07/21/12 1620  WBC 4.4 5.8  HGB 8.0* 9.0*  HCT 23.5* 27.4*  PLT 99* 117*   BMET:  Basename 07/22/12 0330 07/21/12 1620  NA 132* 133*  K 4.3 4.3  CL 101 100  CO2 22 22  GLUCOSE 102* 117*  BUN 40* 38*  CREATININE 1.09 0.85  CALCIUM 8.0* 8.1*    PT/INR:  Basename 07/20/12 1919  LABPROT 14.5  INR 1.15   ABG    Component Value Date/Time   PHART 7.395 07/22/2012 0615   HCO3 23.7 07/22/2012 0615   TCO2 25 07/22/2012 0615   ACIDBASEDEF 1.0 07/22/2012 0615   O2SAT 97.0 07/22/2012 0615   CBG (last 3)   Basename 07/22/12 1905 07/22/12 1142 07/22/12 0613  GLUCAP 83 96 128*    Assessment/Plan: S/P Procedure(s) (LRB): VIDEO ASSISTED THORACOSCOPY (VATS)/EMPYEMA (Left) VIDEO BRONCHOSCOPY (Bilateral) Stable postop VATS   LOS: 3 days    VAN TRIGT III,PETER 07/22/2012

## 2012-07-22 NOTE — Care Management Note (Unsigned)
    Page 1 of 2   08/05/2012     5:31:44 PM   CARE MANAGEMENT NOTE 08/05/2012  Patient:  Christian [name], Christian [name]   Account Number:  [id number]  Date Initiated:  07/20/2012  Documentation initiated by:  DAVIS,RHONDA  Subjective/Objective Assessment:   pt readmitted due to aspiration pneumonia and poss sepsis     Action/Plan:   from [facility]   Anticipated DC Date:  08/05/2012   Anticipated DC Plan:  SKILLED NURSING FACILITY  In-house referral  Clinical Social Worker      DC Planning Services  NA      [facility] Choice  NA   Choice offered to / List presented to:  NA   DME arranged  NA      DME agency  NA     HH arranged  NA      HH agency  NA   Status of service:  In process, will continue to follow Medicare Important Message given?  NA - LOS <3 / Initial given by admissions (If response is "NO", the following Medicare IM given date fields will be blank) Date Medicare IM given:   Date Additional Medicare IM given:    Discharge Disposition:    Per UR Regulation:  Reviewed for med. necessity/level of care/duration of stay  If discussed at Long Length of Stay Meetings, dates discussed:   08/02/2012    Comments:  08/05/12 Maddalena Linarez,RN,BSN [phone] MET WITH PT, DAUGHTER AND GRANDDAUGHTER.  DAUGHTER QUITE UPSET, WANTS PT TO GO TO [facility] [place], BUT UNFORTUNATELY PT HAS [employer], AND THEY WILL NOT APPROVE LTAC STAY.  PT HAS BEEN AT [facility] [place], AND SHE IS UNHAPPY WITH THIS.  WE DISCUSSED ALTERNATIVES, IE, GOING HOME WITH [facility], OR DISCHARGING TO ANOTHER SNF.  THEY ARE INTERESTED IN [facility] [facility].  WILL CONSULT CSW TO SEE IF THIS FACILITY MIGHT HAVE AVAILABLE PRIVATE ROOM FOR PT EARLY NEXT WEEK.  CSW TO FOLLOW UP ON MONDAY.  08/02/12- 1145- Donn Pierini RN, BSN [phone] Pt remains on IV abx- for thoracentesis today- chest tube removed 07/30/12- plan remains to return to SNF when medically stable.  07-26-12 7:50am Avie Arenas, RNBSN  [phone] Remains on IV antibiotics for necrotizing pneumonis.  post op VATS - still with CT's - amio to po on 12-23 for Atrial fib.  07-22-12 7:40am Avie Arenas, RNBSWN [phone] Post op VATS  on 12-19 - remains on vent and pressors.  [id number] [name], RN, BSN, CCM: CHART REVIEWED AND UPDATED.  Next chart review due on [id number]. NO DISCHARGE NEEDS PRESENT AT THIS TIME.  Have spoken with the daguhter Andrey Campanile concerning the level of care that her Father is recieving due to family upset about condition. Reason for medications and treatments explained and Daughter was able to talk to the MD via phone.  Family outlook toward patient and staff improved. CASE MANAGEMENT [phone]

## 2012-07-22 NOTE — Procedures (Signed)
Extubation Procedure Note  Patient Details:   Name: Christian [name] DOB: 1931/11/02 MRN: [id number]   Airway Documentation:     Evaluation  O2 sats: stable throughout Complications: No apparent complications Patient did tolerate procedure well. Bilateral Breath Sounds: Clear;Diminished Suctioning: Airway;Oral Yes  Pt positive for cuff leak, extubated to 5lpm [place] per MD order. Pt tolerated extubation procedure well, and is resting comfortably at this time. Vitals are within normal limits. RT will continue to monitor.   Parke Poisson 07/22/2012, 10:05 AM

## 2012-07-22 NOTE — Progress Notes (Signed)
PULMONARY  / CRITICAL CARE MEDICINE  Name: Christian [name] MRN: [id number] DOB: [birth year]    LOS: 3  REFERRING MD :  [facility] PCP: Kriste Basque CHIEF COMPLAINT:  Loculated left pleural effusion/empyema    BRIEF PATIENT DESCRIPTION:  78 yo male, PCP of [name], seen in office 12-17 with increased effusion. He was discharged from [facility] 11/25 to 12-10 to [facility] after treatment for MRSA necrotizing pna.  VATS/decortication 12/19 > empyema  LINES / TUBES: R IJ CVL 12/19 >> 12/20 (ordered) ETT 12/19 >> 12/20 PICC (ordered) 12/20 >>   CULTURES: 12-18 thora fluid>>cloudy, 45% PMNs 12-18 bcx2>> 12-18 uc>> 12/19 pleural fluid>>>  ANTIBIOTICS: Levaquin12/18 >> 12/20 vanc 12/18 >> Cefepime 12/18 >>  SIGNIFICANT EVENTS:  12/20 VATS decortication. Findings c/w empyema   INTERVAL HISTORY:  Passed SBT and looks good. Extubated  VITAL SIGNS: Temp:  [97.8 F (36.6 C)-98.8 F (37.1 C)] 97.8 F (36.6 C) (12/20 1201) Pulse Rate:  [83-120] 101  (12/20 1500) Resp:  [11-37] 20  (12/20 1500) BP: (69-127)/(41-64) 92/47 mmHg (12/20 1500) SpO2:  [95 %-100 %] 100 % (12/20 1500) Arterial Line BP: (72-141)/(40-70) 109/47 mmHg (12/20 1500) FiO2 (%):  [30 %-50.2 %] 30 % (12/20 0930) Weight:  [86.3 kg (190 lb 4.1 oz)] 86.3 kg (190 lb 4.1 oz) (12/19 1900) HEMODYNAMICS: CVP:  [19 mmHg-23 mmHg] 19 mmHg VENTILATOR SETTINGS: Vent Mode:  [-] PSV;CPAP FiO2 (%):  [30 %-50.2 %] 30 % Set Rate:  [14 bmp-16 bmp] 14 bmp Vt Set:  [550 mL-620 mL] 550 mL PEEP:  [5 cmH20] 5 cmH20 Pressure Support:  [5 cmH20] 5 cmH20 Plateau Pressure:  [20 cmH20-28 cmH20] 20 cmH20 INTAKE / OUTPUT: Intake/Output      12/19 0701 - 12/20 0700 12/20 0701 - 12/21 0700   P.O.     I.V. (mL/kg) 3082.3 (35.7) 115.5 (1.3)   Blood  300   IV Piggyback 1218 396   Total Intake(mL/kg) 4300.3 (49.8) 811.5 (9.4)   Urine (mL/kg/hr) 715 (0.3) 1135 (1.3)   Blood 300    Chest Tube 810 70   Total Output 1825 1205   Net +2475.3  -393.5          PHYSICAL EXAMINATION: General: NAD Neuro: no focal deficits HEENT:  WNL Cardiovascular: sinus with PACs, no M Lungs: diminished on L, no wheeze Abdomen:  Soft, NT, NABS Musculoskeletal:  Intact Skin:  Warm + le edema (chronic)   LABS: Cbc  Lab 07/22/12 0330 07/21/12 1620 07/21/12 1335 07/20/12 1919  WBC 4.4 -- -- --  HGB 8.0* 9.0* 9.5* --  HCT 23.5* 27.4* 28.0* --  PLT 99* 117* -- 132*    Chemistry   Lab 07/22/12 0330 07/21/12 1620 07/21/12 1335 07/20/12 1919  NA 132* 133* 135 --  K 4.3 4.3 4.5 --  CL 101 100 -- 98  CO2 22 22 -- 26  BUN 40* 38* -- 44*  CREATININE 1.09 0.85 -- 0.99  CALCIUM 8.0* 8.1* -- 8.8  MG 1.9 -- -- --  PHOS 2.2* -- -- --  GLUCOSE 102* 117* -- 153*    Liver fxn  Lab 07/20/12 1919  AST 16  ALT 17  ALKPHOS 117  BILITOT 1.0  PROT 5.6*  ALBUMIN 1.9*   coags  Lab 07/20/12 1919  APTT 36  INR 1.15   Sepsis markers No results found for this basename: LATICACIDVEN:3,PROCALCITON:3 in the last 168 hours Cardiac markers  Lab 07/19/12 1805  CKTOTAL --  CKMB --  TROPONINI <0.30  BNP  Lab 07/19/12 1805  PROBNP 2817.0*   ABG  Lab 07/22/12 0615 07/22/12 0328 07/21/12 1615  PHART 7.395 7.597* 7.513*  PCO2ART 38.6 25.7* 28.7*  PO2ART 91.0 248.0* 167.0*  HCO3 23.7 25.1* 22.9  TCO2 25 26 23.8    CBG trend  Lab 07/22/12 1142 07/22/12 0613 07/22/12 0326 07/22/12 0005 07/21/12 1951  GLUCAP 96 128* 99 81 85    IMAGING: Ct Chest Without Contrast  07/21/2012  *RADIOLOGY REPORT*  Clinical Data: Postop evaluation of empyema  CT CHEST WITHOUT CONTRAST  Technique:  Multidetector CT imaging of the chest was performed following the standard protocol without IV contrast.  Comparison: Thorax 11/30 1013  Findings: Exam is degraded by patient respirator motion and.  The left hemithorax is completely opacified by pleural fluid which has simple fluid density.  The right upper lobe and right lower lobe are collapsed centrally  towards the hilum.  There is a much smaller pleural effusion on the right with mild right basilar consolidation (image 41) improved from prior.  The right upper lobe is clear.  The right mainstem bronchus appears normal.  The distal left main stem bronchus (at the level of the lingular bronchus) contains an endoluminal plug  measuring 2.1 x 1.4  centimeters (image 25). This could represent a endobronchial neoplasm versus mucous plugging or infection.  The heart appears normal without pericardial fluid.   There is valvular calcifications.  Prior sternotomy.  Review of the upper abdomen is unremarkable.  Review the skeleton demonstrates degenerative change.  IMPRESSION:  1.  Filling defect within the distal left main stem bronchus representing either endobronchial neoplasm versus mucous plugging. Consider bronchoscopy for evaluation.  2.  Complete filling of the left hemithorax with pleural fluid and atelectatic lung which contracts centrally toward the hilum. 3.  Small right effusion and right lower lobe consolidation.  The effusion is new while the consolidation is improved.   Original Report Authenticated By: Genevive Bi, M.D.    Dg Chest Port 1 View  07/22/2012  *RADIOLOGY REPORT*  Clinical Data: Line placement.  PORTABLE CHEST - 1 VIEW  Comparison: One-view chest 07/22/2012 at 06:40 a.m.  Findings: A new right-sided PICC line is in place with the tip is at the cavoatrial junction.  The patient has been extubated.  The right IJ sheath remains.  Two left-sided chest tubes are noted. Large left pleural effusion remains.  A small right pleural effusion is stable.  Mild generalized edema has increased since the prior study.  There is no significant pneumothorax.  IMPRESSION:  1.  Interval placement right-sided PICC line with the tip at the cavoatrial junction. 2.  The cardiomegaly and congestive heart failure with slight increase in diffuse generalized edema. 3.  Left-sided chest tubes are in place without  evidence for a pneumothorax.   Original Report Authenticated By: Marin Roberts, M.D.    Dg Chest Port 1 View  07/22/2012  *RADIOLOGY REPORT*  Clinical Data: Post VATS, empyema, assess endotracheal tube position  PORTABLE CHEST - 1 VIEW  Comparison: Portable exam 0640 hours compared to 07/21/2012  Findings: Tip of endotracheal tube 5.8 cm above carina. Paired left thoracostomy tubes again noted. Enlargement of cardiac silhouette post median sternotomy. Calcified left hilar adenopathy. Persistent atelectasis versus consolidation right base. Persistent left lower lobe atelectasis versus consolidation. Left upper lobe atelectasis. No pneumothorax. Right jugular line stable.  IMPRESSION: Previously identified left pneumothorax resolved. Persistent left pleural effusion and significant atelectasis versus consolidation identified throughout left lung as well  as at right base.   Original Report Authenticated By: Ulyses Southward, M.D.    Dg Chest Portable 1 View  07/21/2012  *RADIOLOGY REPORT*  Clinical Data: Thoracotomy and chest tube insertion  PORTABLE CHEST - 1 VIEW  Comparison: CT 07/21/2012  Findings: Two chest tubes on the left with drainage of a large left effusion.  There remains left pleural effusion and collapse of the left lower lobe.  Probable left apical pneumothorax.  Right lower lobe consolidation is slightly improved. Vascular congestion with probable edema has progressed on the right. Question cavitary lesion in the right lateral lung base versus a loculated hydropneumothorax.  This was not present previously.  Endotracheal tube in good position.  Right jugular catheter tip in the right innominate vein.  IMPRESSION: Partial drainage of large left effusion.  Two chest tubes on the left.  Probable left apical pneumothorax.  Vascular congestion with probable edema in the right lung.  Right pleural effusion.  15 mm air-filled cavity in the right lung base may represent hydro pneumothorax or a cavitary  lesion.  This was not present on the recent CT.   Original Report Authenticated By: Janeece Riggers, M.D.     ECG:  DIAGNOSES: Active Problems:  Necrotizing pneumonia, community acquired MRSA  ESSENTIAL HYPERTENSION  COPD (chronic obstructive pulmonary disease)  CAD (coronary artery disease)  Dysphagia, pharyngeal  Pleural effusion  Atrial fibrillation  Pedal edema  Empyema of left pleural space  He will need prolonged course of abx and clearly failed attempt @ oral therapy   PLAN: Monitor in ICU post extubation Post op mgmt per TCTS Abx as above F/U culture data and narrow abx accordingly I have ordered PICC with plan for 4-6 wks IV abx Cont rest of medical mgmt as is  Discussed with Dr Dorris Fetch 35 mins CCM  Billy Fischer, MD ; [facility] [phone].  After 5:30 PM or weekends, call [phone]

## 2012-07-22 NOTE — Progress Notes (Signed)
eLink Physician-Brief Progress Note Patient Name: Christian [name] DOB: 1931-10-08 MRN: [id number]  Date of Service  07/22/2012   HPI/Events of Note  Call from nurse reporting hypotension in patient with MMP s/p VATS for emphyema.  Now with BP of 100/47 (60) following 500 cc albumin bolus.  UOP also low at 15 to 20 cc/hr.   eICU Interventions  Plan: Check CVP  - if low will continue to administer fluid.  If greater than 10 will initiate pressor support   Intervention Category Intermediate Interventions: Hypotension - evaluation and management  Silver Achey 07/22/2012, 12:51 AM

## 2012-07-22 NOTE — Progress Notes (Signed)
eLink Physician-Brief Progress Note Patient Name: Christian [name] DOB: [birth year] MRN: [id number]  Date of Service  07/22/2012   HPI/Events of Note  Awake and pulling at ETT  eICU Interventions  Order for soft wrist restraints for patient safety   Intervention Category Minor Interventions: Agitation / anxiety - evaluation and management  DETERDING,ELIZABETH 07/22/2012, 6:27 AM

## 2012-07-22 NOTE — Progress Notes (Signed)
ANTIBIOTIC CONSULT NOTE - FOLLOW UP  Pharmacy Consult:  Vancomycin / Cefepime Indication:  HCAP / Empyema  Allergies  Allergen Reactions  . Ivp Dye (Iodinated Diagnostic Agents)     Patient Measurements: Height: 6' (182.9 cm) Weight: 190 lb 4.1 oz (86.3 kg) IBW/kg (Calculated) : 77.6   Vital Signs: Temp: 98.8 F (37.1 C) (12/20 0845) Temp src: Oral (12/20 0845) BP: 106/57 mmHg (12/20 0930) Pulse Rate: 103  (12/20 0930) Intake/Output from previous day: 12/19 0701 - 12/20 0700 In: 4300.3 [I.V.:3082.3; IV Piggyback:1218] Out: 1825 [Urine:715; Blood:300; Chest Tube:810] Intake/Output from this shift: Total I/O In: 50 [I.V.:50] Out: -   Labs:  Basename 07/22/12 0330 07/21/12 1620 07/20/12 1919  WBC 4.4 5.8 8.0  HGB 8.0* 9.0* 10.4*  PLT 99* 117* 132*  LABCREA -- -- --  CREATININE 1.09 0.85 0.99   Estimated Creatinine Clearance: 61.3 ml/min (by C-G formula based on Cr of 1.09). No results found for this basename: VANCOTROUGH:2,VANCOPEAK:2,VANCORANDOM:2,GENTTROUGH:2,GENTPEAK:2,GENTRANDOM:2,TOBRATROUGH:2,TOBRAPEAK:2,TOBRARND:2,AMIKACINPEAK:2,AMIKACINTROU:2,AMIKACIN:2, in the last 72 hours   Microbiology: Recent Results (from the past 720 hour(s))  CULTURE, RESPIRATORY     Status: Normal   Collection Time   07/01/12 11:20 PM      Component Value Range Status Comment   Specimen Description SPUTUM   Final    Special Requests NONE   Final    Gram Stain     Final    Value: ABUNDANT WBC PRESENT,BOTH PMN AND MONONUCLEAR     MODERATE SQUAMOUS EPITHELIAL CELLS PRESENT     MODERATE GRAM POSITIVE COCCI IN CLUSTERS     IN PAIRS IN CHAINS RARE GRAM POSITIVE RODS   Culture     Final    Value: MODERATE METHICILLIN RESISTANT STAPHYLOCOCCUS AUREUS     Note: RIFAMPIN AND GENTAMICIN SHOULD NOT BE USED AS SINGLE DRUGS FOR TREATMENT OF STAPH INFECTIONS. This organism DOES NOT demonstrate inducible Clindamycin resistance in vitro. CRITICAL RESULT CALLED TO, READ BACK BY AND VERIFIED WITH:  REEVES RN 9AM      07/04/12 GUSTK   Report Status 07/04/2012 FINAL   Final    Organism ID, Bacteria METHICILLIN RESISTANT STAPHYLOCOCCUS AUREUS   Final   MRSA PCR SCREENING     Status: Abnormal   Collection Time   07/02/12 10:58 AM      Component Value Range Status Comment   MRSA by PCR POSITIVE (*) NEGATIVE Final   CLOSTRIDIUM DIFFICILE BY PCR     Status: Normal   Collection Time   07/07/12  1:05 PM      Component Value Range Status Comment   C difficile by pcr NEGATIVE  NEGATIVE Final   BODY FLUID CULTURE     Status: Normal (Preliminary result)   Collection Time   07/20/12 11:24 AM      Component Value Range Status Comment   Specimen Description FLUID PLEURAL   Final    Special Requests NONE   Final    Gram Stain     Final    Value: NO WBC SEEN     NO ORGANISMS SEEN   Culture NO GROWTH   Final    Report Status PENDING   Incomplete   MRSA PCR SCREENING     Status: Abnormal   Collection Time   07/20/12  5:31 PM      Component Value Range Status Comment   MRSA by PCR POSITIVE (*) NEGATIVE Final   CULTURE, RESPIRATORY     Status: Normal (Preliminary result)   Collection Time   07/21/12 12:13  PM      Component Value Range Status Comment   Specimen Description BRONCHIAL WASHINGS   Final    Special Requests PATIENT ON FOLLOWING MAXIPIME,VANCOMYCIN   Final    Gram Stain     Final    Value: FEW WBC PRESENT, PREDOMINANTLY PMN     NO SQUAMOUS EPITHELIAL CELLS SEEN     NO ORGANISMS SEEN   Culture NO GROWTH 1 DAY   Final    Report Status PENDING   Incomplete   TISSUE CULTURE     Status: Normal (Preliminary result)   Collection Time   07/21/12  1:01 PM      Component Value Range Status Comment   Specimen Description TISSUE   Final    Special Requests LEFT PLEURAL PEEL PT ON MAXIPIME VANCOMYCIN   Final    Gram Stain     Final    Value: RARE WBC PRESENT,BOTH PMN AND MONONUCLEAR     NO ORGANISMS SEEN   Culture NO GROWTH 1 DAY   Final    Report Status PENDING   Incomplete   BODY  FLUID CULTURE     Status: Normal (Preliminary result)   Collection Time   07/21/12  1:01 PM      Component Value Range Status Comment   Specimen Description FLUID LEFT PLEURAL   Final    Special Requests PT ON MAXIPIME VANCOMYCIN   Final    Gram Stain     Final    Value: RARE WBC PRESENT,BOTH PMN AND MONONUCLEAR     NO ORGANISMS SEEN   Culture PENDING   Incomplete    Report Status PENDING   Incomplete        Assessment: 89 yom recently discharged (07/12/12) with Augmentin + Linezolid (until 08/08/12) for CA-MRSA necrotizing PNA and Fluconazole x 10d for oral thrush.  Patient presented to [facility] 07/19/12 at PCP's recommendation given worsening PNA/pleural effusion on CXR and SOB.  Transferred to [facility] on 07/20/12, now s/p thoracoscopy, drainage of empyema and decortication.  Currently on vancomycin and cefepime.  Noted patient's SCr has trended up.  Vanc 12/17 x 8 days >> Cefepime 12/17 x 8 days >> LVQ 12/17 x 3 days >> 12/19  12/19 bronchial washing cx - pending 12/19 left pleural fluid cx - pending 12/19 left pleural tissue cx - pending 12/18 pleural fluid cx - pending   Goal of Therapy:  Vancomycin trough level 15-20 mcg/ml   Plan:  - Vanc 1gm IV Q12H - Change Cefepime to 1gm IV Q8H - Monitor renal fxn, clinical course, vanc trough soon - F/U initiation of nutritional support    Akiel Fennell D. Laney Potash, PharmD, BCPS Pager:  [phone] 07/22/2012, 9:43 AM

## 2012-07-22 NOTE — Progress Notes (Signed)
eLink Physician-Brief Progress Note Patient Name: Christian [name] DOB: 1931/12/24 MRN: [id number]  Date of Service  07/22/2012   HPI/Events of Note  Resp alkalosis with pH 7.59/25/248/25   eICU Interventions  Plan: Decrease TV from 620 to 550 cc RR to remain at 16   Intervention Category Intermediate Interventions: Other: (Acid/Base)  Christian [name] 07/22/2012, 3:36 AM

## 2012-07-22 NOTE — Op Note (Signed)
NAMEJONTAY, MASTON                ACCOUNT NO.:  [id number]  MEDICAL RECORD NO.:  [id number]  LOCATION:  2310                         FACILITY:  [facility]  PHYSICIAN:  Salvatore Decent. Dorris Fetch, M.D.DATE OF BIRTH:  07-06-1932  DATE OF PROCEDURE:  07/21/2012 DATE OF DISCHARGE:                              OPERATIVE REPORT   PREOPERATIVE DIAGNOSES:  Left pleural effusion, probable empyema, occlusion of left mainstem bronchus.  POSTOPERATIVE DIAGNOSES:  Left organizing empyema, mucous plug in the left mainstem bronchus.  PROCEDURE:  Bronchoscopy, left video-assisted thoracoscopy, drainage of empyema, and decortication.  SURGEON:  Salvatore Decent. Dorris Fetch, MD  ASSISTANT:  None.  ANESTHESIA:  General.  FINDINGS:   Bronchoscopy: Left mainstem completely occluded by thick mucus.  No endobronchial lesions seen after mucus cleared.  Left VATS, 3 L of bloody fluid, early organizing empyema, good re- expansion of the lung post decortication.  CLINICAL NOTE:  Mr. Cueto is a 78 year old gentleman, who recently was treated for MRSA pneumonia.  He had been discharged to rehab, however, followup chest x-ray showed worsening pneumonia and increasing left pleural effusion.  He was admitted. Dr. Tyrone Sage was consulted and recommended bronchoscopy and left video-assisted thoracoscopy for decortication.  He subsequently had a CT of the chest which showed a small right effusion, a large left effusion, complete atelectasis of the left lung, and occlusion of the left main stem bronchus, mucus versus tumor.  I saw the patient on the morning of surgery.  I agreed with Dr. Dennie Maizes assessment.  I have discussed this with the patient who accepted the risks of surgery and agreed to proceed.  OPERATIVE NOTE:  Mr. Maalouf was brought to the preoperative holding area on July 21, 2012.  There Anesthesia placed an arterial blood pressure monitoring line and a central line.  He was taken to the operating  room, anesthetized, and intubated with a single-lumen endotracheal tube. Flexible fiberoptic bronchoscopy was performed.  On insertion of the bronchoscope, there was thick white viscous sputum up to the level of the carina with a slight spill over into the right mainstem.  This mucus was cleared with suctioning.  A portion was sent for cytology.  The remainder was sent for cultures for bacteria, fungus and AFB.  After clearing all the sputum from both sides, the patient was noted to have normal endobronchial anatomy and no endobronchial lesions to the level of the subsegmental bronchi.  There was considerable edema in the airways on the left side moreso than the right.  The patient then was reintubated with a double-lumen endotracheal tube. He was placed in a right lateral decubitus position and the left chest was prepped and draped in usual sterile fashion.  An incision was made in the 8th intercostal space. It was carried through the skin and subcutaneous tissue.  The chest was entered bluntly using a hemostat. A sucker was placed into the chest and fluid was evacuated.  Specimens were again sent for pathology as well as the aforementioned cultures.  A total of 3 L of bloody fluid was evacuated.  A port was inserted into the incision and the scope was placed into the chest.  A small working incision was made  in approximately the 5th intercostal space anterolaterally.  No rib spreading was performed.  There was still fluid And an extensive amount of fibrinous debris both on the visceral and parietal pleural surfaces as well as free-floating in the pleural space. This tissue was sent for pathology and cultures as well.  After all of this had been evacuated, it was clear that there was some entrapment, particularly the lower lobe.  The upper lobe expanded reasonably well. The lower lobe was decorticated using a blunt dissection with a Kittner dissector and the peel removed with a sponge  stick.  The patient intermittently required ventilation of the left lung during the procedure and that was used to assess the effectiveness of the decortication.  There was some air leak, particularly from the inferior aspect of the lower lobe after the decortication.  The chest was copiously irrigated with several liters of warm saline.  A 3rd incision was made for placement of one of the chest tubes and the original port was used for the other chest tube.  A 32-French Blake drains were placed anteriorly and posteriorly in the chest through these incisions and secured with #1 silk sutures.  After final inspection with the scope with the left lung inflated, scope was removed and the working incision was closed with a running #1 Vicryl fascial suture followed by a 2-0 Vicryl subcutaneous suture and a 3-0 Vicryl subcuticular suture.  The patient will be kept electively intubated overnight to keep the lung expanded and assist with secretion clearance.  We will attempt to wean the ventilator tomorrow.     Salvatore Decent Dorris Fetch, M.D.     SCH/MEDQ  D:  07/21/2012  T:  07/22/2012  Job:  [id number]

## 2012-07-22 NOTE — Progress Notes (Addendum)
1 Day Post-Op Procedure(s) (LRB): VIDEO ASSISTED THORACOSCOPY (VATS)/EMPYEMA (Left) VIDEO BRONCHOSCOPY (Bilateral) Subjective: Intubated, sedated  Objective: Vital signs in last 24 hours: Temp:  [96.1 F (35.6 C)-98.6 F (37 C)] 98.3 F (36.8 C) (12/20 0734) Pulse Rate:  [86-120] 86  (12/20 0800) Cardiac Rhythm:  [-] Atrial fibrillation (12/19 2000) Resp:  [11-37] 37  (12/20 0800) BP: (69-116)/(41-70) 86/56 mmHg (12/20 0700) SpO2:  [95 %-100 %] 100 % (12/20 0800) Arterial Line BP: (72-141)/(40-70) 141/70 mmHg (12/20 0800) FiO2 (%):  [30 %-50.2 %] 30.2 % (12/20 0800) Weight:  [190 lb 4.1 oz (86.3 kg)] 190 lb 4.1 oz (86.3 kg) (12/19 1900)  Hemodynamic parameters for last 24 hours: CVP:  [19 mmHg-23 mmHg] 19 mmHg  Intake/Output from previous day: 12/19 0701 - 12/20 0700 In: 4300.3 [I.V.:3082.3; IV Piggyback:1218] Out: 1825 [Urine:715; Blood:300; Chest Tube:810] Intake/Output this shift:    General appearance: sedated on ventilator Heart: regular rate and rhythm Lungs: rhonchi bilaterally Abdomen: normal findings: soft, non-tender no air leak  Lab Results:  Basename 07/22/12 0330 07/21/12 1620  WBC 4.4 5.8  HGB 8.0* 9.0*  HCT 23.5* 27.4*  PLT 99* 117*   BMET:  Basename 07/22/12 0330 07/21/12 1620  NA 132* 133*  K 4.3 4.3  CL 101 100  CO2 22 22  GLUCOSE 102* 117*  BUN 40* 38*  CREATININE 1.09 0.85  CALCIUM 8.0* 8.1*    PT/INR:  Basename 07/20/12 1919  LABPROT 14.5  INR 1.15   ABG    Component Value Date/Time   PHART 7.395 07/22/2012 0615   HCO3 23.7 07/22/2012 0615   TCO2 25 07/22/2012 0615   ACIDBASEDEF 1.0 07/22/2012 0615   O2SAT 97.0 07/22/2012 0615   CBG (last 3)   Basename 07/22/12 0613 07/22/12 0326 07/22/12 0005  GLUCAP 128* 99 81    Assessment/Plan: S/P Procedure(s) (LRB): VIDEO ASSISTED THORACOSCOPY (VATS)/EMPYEMA (Left) VIDEO BRONCHOSCOPY (Bilateral) POD # 1  CV- hypotension last PM requiring neosynephrine and volume. BP ok  now  RESP- s/p decortication for empyema. CXR this AM shows greatly improved aeration of LUL but still has dense consolidation/ atelectasis of LLL. Moderate secretions. On xopenex and atrovent Hopefully can wean from vent today.  ID- Continue Vanco, cefepime, levaquin for pneumonia/ empyema  ANEMIA secondary to ABL- transfuse given hypotension  RENAL- lytes, creatinine OK  SCD for DVT prophylaxis, wait another 24 hours before resuming lovenox  CBG well controlled   LOS: 3 days    HENDRICKSON,STEVEN C 07/22/2012

## 2012-07-22 NOTE — Progress Notes (Signed)
Call place to elink concerning low BP.

## 2012-07-22 NOTE — Progress Notes (Signed)
eLink Physician-Brief Progress Note Patient Name: Christian [name] DOB: 08-24-1931 MRN: [id number]  Date of Service  07/22/2012   HPI/Events of Note  Hypophosphatemia   eICU Interventions  Phos replaced   Intervention Category Minor Interventions: Electrolytes abnormality - evaluation and management  Christian [name] 07/22/2012, 4:30 AM

## 2012-07-22 NOTE — Progress Notes (Signed)
eLink Physician-Brief Progress Note Patient Name: NIKO PENSON DOB: 1932-04-17 MRN: [id number]  Date of Service  07/22/2012   HPI/Events of Note  Continued hypotension with MAP in the 50s.  CVP earlier was 22.  Has HR in the low 100s.   eICU Interventions  Plan: Start NEO gtt for BP support.   Intervention Category Major Interventions: Hypotension - evaluation and management  Matan Steen 07/22/2012, 3:10 AM

## 2012-07-22 NOTE — Progress Notes (Signed)
Peripherally Inserted Central Catheter/Midline Placement  The IV Nurse has discussed with the patient and/or persons authorized to consent for the patient, the purpose of this procedure and the potential benefits and risks involved with this procedure.  The benefits include less needle sticks, lab draws from the catheter and patient may be discharged home with the catheter.  Risks include, but not limited to, infection, bleeding, blood clot (thrombus formation), and puncture of an artery; nerve damage and irregular heat beat.  Alternatives to this procedure were also discussed.  PICC/Midline Placement Documentation        Stacie Glaze Horton 07/22/2012, 1:54 PM

## 2012-07-23 ENCOUNTER — Inpatient Hospital Stay (HOSPITAL_COMMUNITY): Payer: Medicare Other

## 2012-07-23 DIAGNOSIS — J9819 Other pulmonary collapse: Secondary | ICD-10-CM

## 2012-07-23 DIAGNOSIS — J9811 Atelectasis: Secondary | ICD-10-CM | POA: Diagnosis present

## 2012-07-23 LAB — GLUCOSE, CAPILLARY
Glucose-Capillary: 107 mg/dL — ABNORMAL HIGH (ref 70–99)
Glucose-Capillary: 115 mg/dL — ABNORMAL HIGH (ref 70–99)
Glucose-Capillary: 120 mg/dL — ABNORMAL HIGH (ref 70–99)
Glucose-Capillary: 91 mg/dL (ref 70–99)
Glucose-Capillary: 92 mg/dL (ref 70–99)

## 2012-07-23 LAB — CBC
HCT: 29.5 % — ABNORMAL LOW (ref 39.0–52.0)
Hemoglobin: 10 g/dL — ABNORMAL LOW (ref 13.0–17.0)
MCH: 29.7 pg (ref 26.0–34.0)
MCHC: 33.9 g/dL (ref 30.0–36.0)
MCV: 87.5 fL (ref 78.0–100.0)
RDW: 13.4 % (ref 11.5–15.5)

## 2012-07-23 LAB — TYPE AND SCREEN
ABO/RH(D): O POS
Antibody Screen: NEGATIVE
Unit division: 0

## 2012-07-23 LAB — BODY FLUID CULTURE: Gram Stain: NONE SEEN

## 2012-07-23 LAB — COMPREHENSIVE METABOLIC PANEL
Albumin: 1.8 g/dL — ABNORMAL LOW (ref 3.5–5.2)
BUN: 30 mg/dL — ABNORMAL HIGH (ref 6–23)
Calcium: 8 mg/dL — ABNORMAL LOW (ref 8.4–10.5)
Creatinine, Ser: 0.9 mg/dL (ref 0.50–1.35)
GFR calc Af Amer: >90 mL/min (ref >90)
Glucose, Bld: 105 mg/dL — ABNORMAL HIGH (ref 70–99)
Potassium: 4.1 mEq/L (ref 3.5–5.1)
Total Protein: 4.8 g/dL — ABNORMAL LOW (ref 6.0–8.3)

## 2012-07-23 MED ORDER — AMIODARONE HCL IN DEXTROSE 360-4.14 MG/200ML-% IV SOLN
INTRAVENOUS | Status: AC
  Administered 2012-07-23: 200 mL
  Filled 2012-07-23: qty 200

## 2012-07-23 MED ORDER — DM-GUAIFENESIN ER 30-600 MG PO TB12
1.0000 | ORAL_TABLET | Freq: Two times a day (BID) | ORAL | Status: DC | PRN
  Administered 2012-07-27 – 2012-07-30 (×2): 1 via ORAL
  Filled 2012-07-23 (×4): qty 1

## 2012-07-23 MED ORDER — FUROSEMIDE 10 MG/ML IJ SOLN
20.0000 mg | Freq: Every day | INTRAMUSCULAR | Status: DC
  Administered 2012-07-23 – 2012-07-24 (×2): 20 mg via INTRAVENOUS
  Filled 2012-07-23: qty 2

## 2012-07-23 MED ORDER — AMIODARONE HCL IN DEXTROSE 360-4.14 MG/200ML-% IV SOLN
1.0000 mg/min | INTRAVENOUS | Status: AC
  Administered 2012-07-23 (×3): 1 mg/min via INTRAVENOUS
  Filled 2012-07-23: qty 200

## 2012-07-23 MED ORDER — FUROSEMIDE 10 MG/ML IJ SOLN
INTRAMUSCULAR | Status: AC
  Filled 2012-07-23: qty 2

## 2012-07-23 MED ORDER — AMIODARONE HCL IN DEXTROSE 360-4.14 MG/200ML-% IV SOLN
0.5000 mg/min | INTRAVENOUS | Status: DC
  Administered 2012-07-23 – 2012-07-24 (×2): 0.5 mg/min via INTRAVENOUS
  Filled 2012-07-23 (×4): qty 200

## 2012-07-23 MED ORDER — AMIODARONE LOAD VIA INFUSION
150.0000 mg | Freq: Once | INTRAVENOUS | Status: DC
  Administered 2012-07-23: 150 mg via INTRAVENOUS

## 2012-07-23 MED ORDER — ENOXAPARIN SODIUM 40 MG/0.4ML ~~LOC~~ SOLN: 40.0000 mg | SUBCUTANEOUS | Status: DC

## 2012-07-23 MED ORDER — AMIODARONE LOAD VIA INFUSION
150.0000 mg | Freq: Once | INTRAVENOUS | Status: DC
  Filled 2012-07-23: qty 83.34

## 2012-07-23 NOTE — Progress Notes (Signed)
ANTIBIOTIC CONSULT NOTE - FOLLOW UP  Pharmacy Consult:  Vancomycin / Cefepime Indication:  HCAP / Empyema  Allergies  Allergen Reactions  . Ivp Dye (Iodinated Diagnostic Agents)     Patient Measurements: Height: 6' (182.9 cm) Weight: 192 lb 7.4 oz (87.3 kg) IBW/kg (Calculated) : 77.6   Vital Signs: Temp: 97.5 F (36.4 C) (12/21 1644) Temp src: Oral (12/21 1644) BP: 92/66 mmHg (12/21 1800) Pulse Rate: 97  (12/21 1800) Intake/Output from previous day: 12/20 0701 - 12/21 0700 In: 2261.5 [I.V.:665.5; Blood:300; IV Piggyback:1296] Out: 1960 [Urine:1680; Chest Tube:280] Intake/Output from this shift: Total I/O In: 845.2 [I.V.:783.2; IV Piggyback:62] Out: 550 [Urine:400; Chest Tube:150]  Labs:  [facility] 07/23/12 0450 07/22/12 0330 07/21/12 1620  WBC 6.2 4.4 5.8  HGB 10.0* 8.0* 9.0*  PLT 123* 99* 117*  LABCREA -- -- --  CREATININE 0.90 1.09 0.85   Estimated Creatinine Clearance: 74.2 ml/min (by C-G formula based on Cr of 0.9).  Basename 07/23/12 1730  VANCOTROUGH 20.5*  VANCOPEAK --  Drue Dun --  GENTTROUGH --  GENTPEAK --  GENTRANDOM --  TOBRATROUGH --  TOBRAPEAK --  TOBRARND --  AMIKACINPEAK --  AMIKACINTROU --  AMIKACIN --      Assessment: 66 yom recently discharged (07/12/12) with Augmentin + Linezolid (until 08/08/12) for CA-MRSA necrotizing PNA and Fluconazole x 10d for oral thrush.  Patient presented to [facility] 07/19/12 at PCP's recommendation given worsening PNA/pleural effusion on CXR and SOB.  Transferred to [facility] on 07/20/12, now s/p thoracoscopy, drainage of empyema and decortication.  Currently on vancomycin and cefepime.  WBC=6.2, afeb and SCr= 0.9 (CrCl~70) with a vancomycin trough= 20.5 at 5:30pm today. Vancomycin noted to stop on 07/27/12.  Vanc 12/17 x 8 days >> Cefepime 12/17 x 8 days >> LVQ 12/17 x 3 days >> 12/19  12/19 bronchial washing cx - ngtd 12/19 left pleural fluid cx - ngtd 12/19 left pleural tissue cx - ngtd 12/18 pleural  fluid cx - ngtd   Goal of Therapy:  Vancomycin trough level 15-20 mcg/ml   Plan:  - Continue Vanc 1gm IV Q12H - Change Cefepime to 1gm IV Q8H - Monitor renal fxn and clinical course  Earnest Bailey D 07/23/2012 6:28 PM

## 2012-07-23 NOTE — Progress Notes (Signed)
  Amiodarone Drug - Drug Interaction Consult Note  Recommendations: Monitor closely for hypokalemia Amiodarone is metabolized by the cytochrome P450 system and therefore has the potential to cause many drug interactions. Amiodarone has an average plasma half-life of 50 days (range 20 to 100 days).   There is potential for drug interactions to occur several weeks or months after stopping treatment and the onset of drug interactions may be slow after initiating amiodarone.   []  Statins: Increased risk of myopathy. Simvastatin- restrict dose to 20mg  daily. Other statins: counsel patients to report any muscle pain or weakness immediately.  []  Anticoagulants: Amiodarone can increase anticoagulant effect. Consider warfarin dose reduction. Patients should be monitored closely and the dose of anticoagulant altered accordingly, remembering that amiodarone levels take several weeks to stabilize.  []  Antiepileptics: Amiodarone can increase plasma concentration of phenytoin, phenytoin dose should be reduced. Note that small changes in phenytoin dose can result in large changes in phenytoin levels. Monitor patient closely and counsel on signs of toxicity.  []  Beta blockers: increased risk of bradycardia, AV block and myocardial depression. Sotalol - avoid concomitant use.  []   Calcium channel blockers (diltiazem and verapamil): increased risk of bradycardia, AV block and myocardial depression.  []   Cyclosporine: Amiodarone increases levels of cyclosporine. Reduced dose of cyclosporine is recommended.  []  Digoxin dose should be halved when amiodarone is started.  [x]  Diuretics: increased risk of cardiotoxicity if hypokalemia occurs.  []  Oral hypoglycemic agents (glyburide, glipizide, glimepiride): increased risk of hypoglycemia. Patient's glucose levels should be monitored closely when initiating amiodarone therapy.   []  Drugs that prolong the QT interval: Concurrent therapy is contraindicated due to  the increased risk of torsades de pointes; . Antibiotics: e.g. fluoroquinolones, erythromycin. . Antiarrhythmics: e.g. quinidine, procainamide, disopyramide, sotalol. . Antipsychotics: e.g. phenothiazines, haloperidol.  . Lithium, tricyclic antidepressants, and methadone. Thank You,  Talbert Cage Poteet  07/23/2012 2:24 PM

## 2012-07-23 NOTE — Progress Notes (Signed)
2 Days Post-Op Procedure(s) (LRB): VIDEO ASSISTED THORACOSCOPY (VATS)/EMPYEMA (Left) VIDEO BRONCHOSCOPY (Bilateral) Subjective: Left lower lobe pneumonia, left empyema status post decortication Mild chest tube drainage no significant air leak Patient walked in the [facility] twice and is working hard with incentive spirometer Objective: Vital signs in last 24 hours: Temp:  [97.5 F (36.4 C)-98.2 F (36.8 C)] 97.5 F (36.4 C) (12/21 1644) Pulse Rate:  [73-142] 97  (12/21 1800) Cardiac Rhythm:  [-] Atrial fibrillation (12/21 0800) Resp:  [17-39] 24  (12/21 1800) BP: (87-112)/(44-66) 92/66 mmHg (12/21 1800) SpO2:  [92 %-100 %] 100 % (12/21 1800) Arterial Line BP: (86-126)/(39-61) 102/48 mmHg (12/21 1800) Weight:  [192 lb 7.4 oz (87.3 kg)] 192 lb 7.4 oz (87.3 kg) (12/21 0600)  Hemodynamic parameters for last 24 hours:   atrial fibrillation alternating with sinus rhythm  Intake/Output from previous day: 12/20 0701 - 12/21 0700 In: 2261.5 [I.V.:665.5; Blood:300; IV Piggyback:1296] Out: 1960 [Urine:1680; Chest Tube:280] Intake/Output this shift: Total I/O In: 845.2 [I.V.:783.2; IV Piggyback:62] Out: 550 [Urine:400; Chest Tube:150]  Exam diminished breath sounds on left   Lab Results:  [facility] 07/23/12 0450 07/22/12 0330  WBC 6.2 4.4  HGB 10.0* 8.0*  HCT 29.5* 23.5*  PLT 123* 99*   BMET:  Basename 07/23/12 0450 07/22/12 0330  NA 135 132*  K 4.1 4.3  CL 101 101  CO2 24 22  GLUCOSE 105* 102*  BUN 30* 40*  CREATININE 0.90 1.09  CALCIUM 8.0* 8.0*    PT/INR:  Basename 07/20/12 1919  LABPROT 14.5  INR 1.15   ABG    Component Value Date/Time   PHART 7.395 07/22/2012 0615   HCO3 23.7 07/22/2012 0615   TCO2 25 07/22/2012 0615   ACIDBASEDEF 1.0 07/22/2012 0615   O2SAT 97.0 07/22/2012 0615   CBG (last 3)   Basename 07/23/12 1208 07/23/12 0821 07/23/12 0611  GLUCAP 135* 115* 92    Assessment/Plan: S/P Procedure(s) (LRB): VIDEO ASSISTED THORACOSCOPY (VATS)/EMPYEMA  (Left) VIDEO BRONCHOSCOPY (Bilateral) Check x-ray today may need bronchoscopy to reexpand the left lung   LOS: 4 days    VAN TRIGT III,Christian [name] 07/23/2012

## 2012-07-23 NOTE — Progress Notes (Signed)
2 Days Post-Op Procedure(s) (LRB): VIDEO ASSISTED THORACOSCOPY (VATS)/EMPYEMA (Left) VIDEO BRONCHOSCOPY (Bilateral) Subjective: Empyema Atelectasis of L lung Cannot clear secretions, CXR worse Will try NTS, ambulation today- if CXR not better in am -- bronch per CCM  Objective: Vital signs in last 24 hours: Temp:  [97.7 F (36.5 C)-98.6 F (37 C)] 97.9 F (36.6 C) (12/21 0823) Pulse Rate:  [62-123] 121  (12/21 1000) Cardiac Rhythm:  [-] Atrial fibrillation (12/21 0800) Resp:  [19-39] 29  (12/21 1000) BP: (73-106)/(44-66) 91/49 mmHg (12/21 1000) SpO2:  [97 %-100 %] 97 % (12/21 1000) Arterial Line BP: (86-128)/(39-59) 88/50 mmHg (12/21 1000) Weight:  [192 lb 7.4 oz (87.3 kg)] 192 lb 7.4 oz (87.3 kg) (12/21 0600)  Hemodynamic parameters for last 24 hours:  rapid afib  Intake/Output from previous day: 12/20 0701 - 12/21 0700 In: 2261.5 [I.V.:665.5; Blood:300; IV Piggyback:1296] Out: 1960 [Urine:1680; Chest Tube:280] Intake/Output this shift: Total I/O In: 210 [I.V.:150; IV Piggyback:60] Out: 165 [Urine:115; Chest Tube:50]    Lab Results:  [facility] 07/23/12 0450 07/22/12 0330  WBC 6.2 4.4  HGB 10.0* 8.0*  HCT 29.5* 23.5*  PLT 123* 99*   BMET:  Basename 07/23/12 0450 07/22/12 0330  NA 135 132*  K 4.1 4.3  CL 101 101  CO2 24 22  GLUCOSE 105* 102*  BUN 30* 40*  CREATININE 0.90 1.09  CALCIUM 8.0* 8.0*    PT/INR:  Basename 07/20/12 1919  LABPROT 14.5  INR 1.15   ABG    Component Value Date/Time   PHART 7.395 07/22/2012 0615   HCO3 23.7 07/22/2012 0615   TCO2 25 07/22/2012 0615   ACIDBASEDEF 1.0 07/22/2012 0615   O2SAT 97.0 07/22/2012 0615   CBG (last 3)   Basename 07/23/12 0821 07/23/12 0358 07/22/12 2345  GLUCAP 115* 91 92    Assessment/Plan: S/P Procedure(s) (LRB): VIDEO ASSISTED THORACOSCOPY (VATS)/EMPYEMA (Left) VIDEO BRONCHOSCOPY (Bilateral)pul toilette,amdulate NTS prn   LOS: 4 days    VAN TRIGT III,Christian [name] 07/23/2012

## 2012-07-23 NOTE — Progress Notes (Signed)
PULMONARY  / CRITICAL CARE MEDICINE  Name: Christian [name] MRN: [id number] DOB: [birth year]    LOS: 4  REFERRING MD :  [facility] PCP: Kriste Basque CHIEF COMPLAINT:  Loculated left pleural effusion/empyema    BRIEF PATIENT DESCRIPTION:  78 yo male, PCP of [name], seen in office 12-17 with increased effusion. He was discharged from [facility] 11/25 to 12-10 to [facility] after treatment for MRSA necrotizing pna.  VATS/decortication 12/19 > empyema  LINES / TUBES: R IJ CVL 12/19 >> 12/20 (ordered) ETT 12/19 >> 12/20 PICC (ordered) 12/20 >>   CULTURES: 12-18 thora fluid>>cloudy, 45% PMNs 12-18 bcx2>> 12-18 uc>> 12/19 pleural fluid>>>  ANTIBIOTICS: Levaquin12/18 >> 12/20 vanc 12/18 >> Cefepime 12/18 >>  SIGNIFICANT EVENTS:  12/20 VATS decortication. Findings c/w empyema   INTERVAL HISTORY:  Adequate sats , no increased work of breathing   VITAL SIGNS: Temp:  [97.7 F (36.5 C)-98.2 F (36.8 C)] 97.7 F (36.5 C) (12/21 1211) Pulse Rate:  [62-142] 113  (12/21 1200) Resp:  [19-39] 27  (12/21 1200) BP: (73-106)/(44-66) 92/47 mmHg (12/21 1200) SpO2:  [92 %-100 %] 100 % (12/21 1200) Arterial Line BP: (86-128)/(39-59) 90/51 mmHg (12/21 1200) Weight:  [87.3 kg (192 lb 7.4 oz)] 87.3 kg (192 lb 7.4 oz) (12/21 0600) HEMODYNAMICS:   VENTILATOR SETTINGS:   INTAKE / OUTPUT: Intake/Output      12/20 0701 - 12/21 0700 12/21 0701 - 12/22 0700   I.V. (mL/kg) 665.5 (7.6) 316.6 (3.6)   Blood 300    IV Piggyback 1296 62   Total Intake(mL/kg) 2261.5 (25.9) 378.6 (4.3)   Urine (mL/kg/hr) 1680 (0.8) 220 (0.4)   Blood     Chest Tube 280 50   Total Output 1960 270   Net +301.5 +108.6          PHYSICAL EXAMINATION: General: NAD Neuro: no focal deficits HEENT:  WNL Cardiovascular: sinus with PACs, no M Lungs: diminished on L, no wheeze Abdomen:  Soft, NT, NABS Musculoskeletal:  Intact Skin:  Warm + le edema (chronic)   LABS: Cbc  Lab 07/23/12 0450 07/22/12 0330 07/21/12 1620   WBC 6.2 -- --  HGB 10.0* 8.0* 9.0*  HCT 29.5* 23.5* 27.4*  PLT 123* 99* 117*    Chemistry   Lab 07/23/12 0450 07/22/12 0330 07/21/12 1620  NA 135 132* 133*  K 4.1 4.3 4.3  CL 101 101 100  CO2 24 22 22   BUN 30* 40* 38*  CREATININE 0.90 1.09 0.85  CALCIUM 8.0* 8.0* 8.1*  MG -- 1.9 --  PHOS -- 2.2* --  GLUCOSE 105* 102* 117*    Liver fxn  Lab 07/23/12 0450 07/20/12 1919  AST 14 16  ALT 13 17  ALKPHOS 86 117  BILITOT 1.2 1.0  PROT 4.8* 5.6*  ALBUMIN 1.8* 1.9*   coags  Lab 07/20/12 1919  APTT 36  INR 1.15   Sepsis markers No results found for this basename: LATICACIDVEN:3,PROCALCITON:3 in the last 168 hours Cardiac markers  Lab 07/19/12 1805  CKTOTAL --  CKMB --  TROPONINI <0.30   BNP  Lab 07/19/12 1805  PROBNP 2817.0*   ABG  Lab 07/22/12 0615 07/22/12 0328 07/21/12 1615  PHART 7.395 7.597* 7.513*  PCO2ART 38.6 25.7* 28.7*  PO2ART 91.0 248.0* 167.0*  HCO3 23.7 25.1* 22.9  TCO2 25 26 23.8    CBG trend  Lab 07/23/12 1208 07/23/12 0821 07/23/12 0358 07/22/12 2345 07/22/12 1905  GLUCAP 135* 115* 91 92 83    IMAGING: Dg  Chest Port 1 View  07/23/2012  *RADIOLOGY REPORT*  Clinical Data: Post left VATS, follow-up  PORTABLE CHEST - 1 VIEW  Comparison: Portable chest x-ray of 07/22/2012  Findings: The left lung is poorly aerated with almost complete opacification.  To left chest tubes are present.  No definite pneumothorax is seen.  There is pleural and parenchymal opacity at the right lung base remaining.  Right PICC line tip is seen to the expected SVC - RA junction.  Heart size is difficult to assess due to opacity throughout the left lung.  IMPRESSION:  1.  Almost complete opacification of the left lung with left chest tubes remaining. 2.  No change in right basilar pleural and parenchymal opacity.   Original Report Authenticated By: Dwyane Dee, M.D.    Dg Chest Port 1 View  07/22/2012  *RADIOLOGY REPORT*  Clinical Data: Line placement.  PORTABLE  CHEST - 1 VIEW  Comparison: One-view chest 07/22/2012 at 06:40 a.m.  Findings: A new right-sided PICC line is in place with the tip is at the cavoatrial junction.  The patient has been extubated.  The right IJ sheath remains.  Two left-sided chest tubes are noted. Large left pleural effusion remains.  A small right pleural effusion is stable.  Mild generalized edema has increased since the prior study.  There is no significant pneumothorax.  IMPRESSION:  1.  Interval placement right-sided PICC line with the tip at the cavoatrial junction. 2.  The cardiomegaly and congestive heart failure with slight increase in diffuse generalized edema. 3.  Left-sided chest tubes are in place without evidence for a pneumothorax.   Original Report Authenticated By: Marin Roberts, M.D.    Dg Chest Port 1 View  07/22/2012  *RADIOLOGY REPORT*  Clinical Data: Post VATS, empyema, assess endotracheal tube position  PORTABLE CHEST - 1 VIEW  Comparison: Portable exam 0640 hours compared to 07/21/2012  Findings: Tip of endotracheal tube 5.8 cm above carina. Paired left thoracostomy tubes again noted. Enlargement of cardiac silhouette post median sternotomy. Calcified left hilar adenopathy. Persistent atelectasis versus consolidation right base. Persistent left lower lobe atelectasis versus consolidation. Left upper lobe atelectasis. No pneumothorax. Right jugular line stable.  IMPRESSION: Previously identified left pneumothorax resolved. Persistent left pleural effusion and significant atelectasis versus consolidation identified throughout left lung as well as at right base.   Original Report Authenticated By: Ulyses Southward, M.D.    Dg Chest Portable 1 View  07/21/2012  *RADIOLOGY REPORT*  Clinical Data: Thoracotomy and chest tube insertion  PORTABLE CHEST - 1 VIEW  Comparison: CT 07/21/2012  Findings: Two chest tubes on the left with drainage of a large left effusion.  There remains left pleural effusion and collapse of the left  lower lobe.  Probable left apical pneumothorax.  Right lower lobe consolidation is slightly improved. Vascular congestion with probable edema has progressed on the right. Question cavitary lesion in the right lateral lung base versus a loculated hydropneumothorax.  This was not present previously.  Endotracheal tube in good position.  Right jugular catheter tip in the right innominate vein.  IMPRESSION: Partial drainage of large left effusion.  Two chest tubes on the left.  Probable left apical pneumothorax.  Vascular congestion with probable edema in the right lung.  Right pleural effusion.  15 mm air-filled cavity in the right lung base may represent hydro pneumothorax or a cavitary lesion.  This was not present on the recent CT.   Original Report Authenticated By: Janeece Riggers, M.D.  ECG:  DIAGNOSES: Principal Problem:  *Acute postprocedural respiratory failure Active Problems:  ESSENTIAL HYPERTENSION  COPD (chronic obstructive pulmonary disease)  CAD (coronary artery disease)  Necrotizing pneumonia, community acquired MRSA  Dysphagia, pharyngeal  Pleural effusion  Atrial fibrillation  Pedal edema  Empyema of left pleural space  He will need prolonged course of abx and clearly failed attempt @ oral therapy   PLAN: Monitor in ICU post extubation Post op mgmt per TCTS Abx as above F/U culture data and narrow abx accordingly  PICC with plan for 4-6 wks IV abx Increase pulmonary hygiene , mobilize pt , flutter /IS , add ezpap for nebs  Cont rest of medical mgmt as is     PARRETT,TAMMY NP-C  [phone]  I have interviewed and examined the patient and reviewed the database. I have formulated the assessment and plan as reflected in the note above with amendments made by me.   Billy Fischer, MD;  PCCM service; Mobile [phone]

## 2012-07-24 ENCOUNTER — Inpatient Hospital Stay (HOSPITAL_COMMUNITY): Payer: Medicare Other

## 2012-07-24 LAB — CBC
HCT: 28.2 % — ABNORMAL LOW (ref 39.0–52.0)
Hemoglobin: 9.4 g/dL — ABNORMAL LOW (ref 13.0–17.0)
MCH: 29.6 pg (ref 26.0–34.0)
MCHC: 33.3 g/dL (ref 30.0–36.0)
MCV: 88.7 fL (ref 78.0–100.0)
Platelets: 139 10*3/uL — ABNORMAL LOW (ref 150–400)
RBC: 3.18 MIL/uL — ABNORMAL LOW (ref 4.22–5.81)
RDW: 13.5 % (ref 11.5–15.5)
WBC: 6.8 10*3/uL (ref 4.0–10.5)

## 2012-07-24 LAB — BODY FLUID CULTURE: Culture: NO GROWTH

## 2012-07-24 LAB — BASIC METABOLIC PANEL
BUN: 31 mg/dL — ABNORMAL HIGH (ref 6–23)
CO2: 25 mEq/L (ref 19–32)
Calcium: 8.3 mg/dL — ABNORMAL LOW (ref 8.4–10.5)
Chloride: 99 mEq/L (ref 96–112)
Creatinine, Ser: 1.01 mg/dL (ref 0.50–1.35)
GFR calc Af Amer: 80 mL/min — ABNORMAL LOW (ref >90)
GFR calc non Af Amer: 69 mL/min — ABNORMAL LOW (ref >90)
Glucose, Bld: 115 mg/dL — ABNORMAL HIGH (ref 70–99)
Potassium: 4.1 mEq/L (ref 3.5–5.1)
Sodium: 132 mEq/L — ABNORMAL LOW (ref 135–145)

## 2012-07-24 LAB — TISSUE CULTURE

## 2012-07-24 LAB — GLUCOSE, CAPILLARY

## 2012-07-24 MED ORDER — FUROSEMIDE 10 MG/ML IJ SOLN
40.0000 mg | Freq: Every day | INTRAMUSCULAR | Status: DC
  Administered 2012-07-25 – 2012-07-27 (×3): 40 mg via INTRAVENOUS
  Filled 2012-07-24 (×5): qty 4

## 2012-07-24 MED ORDER — BOOST PO LIQD
237.0000 mL | Freq: Three times a day (TID) | ORAL | Status: DC
  Administered 2012-07-24 – 2012-08-06 (×14): 237 mL via ORAL
  Filled 2012-07-24 (×51): qty 237

## 2012-07-24 MED ORDER — AMIODARONE HCL 200 MG PO TABS
200.0000 mg | ORAL_TABLET | Freq: Two times a day (BID) | ORAL | Status: DC
  Administered 2012-07-24 – 2012-07-27 (×7): 200 mg via ORAL
  Filled 2012-07-24 (×8): qty 1

## 2012-07-24 MED ORDER — ENSURE COMPLETE PO LIQD
237.0000 mL | Freq: Three times a day (TID) | ORAL | Status: DC
  Administered 2012-07-24 – 2012-08-02 (×16): 237 mL via ORAL

## 2012-07-24 MED ORDER — ENSURE PO LIQD: 1.0000 | Freq: Three times a day (TID) | ORAL | Status: DC

## 2012-07-24 NOTE — Progress Notes (Signed)
PULMONARY  / CRITICAL CARE MEDICINE  Name: Christian [name] MRN: [id number] DOB: 03-26-1932    LOS: 5  REFERRING MD :  [facility] PCP: Kriste Basque CHIEF COMPLAINT:  Loculated left pleural effusion/empyema    BRIEF PATIENT DESCRIPTION:  78 yo male, PCP of [name], seen in office 12-17 with increased effusion. He was discharged from [facility] 11/25 to 12-10 to [facility] after treatment for MRSA necrotizing pna.  VATS/decortication 12/19 > empyema  LINES / TUBES: R IJ CVL 12/19 >> 12/20  ETT 12/19 >> 12/20 PICC 12/20 >>   CULTURES: 12/18 MRSA PCR >> POS 12/19 pleural fluid >> NEG   ANTIBIOTICS: Levaquin12/18 >> 12/20 vanc 12/18 >> Cefepime 12/18 >>  SIGNIFICANT EVENTS:  12/20 VATS decortication. Findings c/w empyema   INTERVAL HISTORY:  Adequate sats , no increased work of breathing   VITAL SIGNS: Temp:  [97.5 F (36.4 C)-98 F (36.7 C)] 97.6 F (36.4 C) (12/22 1251) Pulse Rate:  [73-110] 99  (12/22 1700) Resp:  [15-31] 22  (12/22 1700) BP: (88-117)/(47-75) 112/73 mmHg (12/22 1700) SpO2:  [87 %-100 %] 87 % (12/22 1700) Weight:  [87.6 kg (193 lb 2 oz)] 87.6 kg (193 lb 2 oz) (12/22 0600) HEMODYNAMICS:   VENTILATOR SETTINGS:   INTAKE / OUTPUT: Intake/Output      12/21 0701 - 12/22 0700 12/22 0701 - 12/23 0700   P.O. 120 1200   I.V. (mL/kg) 1650.3 (18.8) 583.6 (6.7)   Blood     IV Piggyback 562 66   Total Intake(mL/kg) 2332.3 (26.6) 1849.6 (21.1)   Urine (mL/kg/hr) 790 (0.4) 533 (0.5)   Chest Tube 230 120   Total Output 1020 653   Net +1312.3 +1196.6        Stool Occurrence  2 x     PHYSICAL EXAMINATION: General: NAD Neuro: no focal deficits HEENT:  WNL Cardiovascular: sinus with PACs, no M Lungs: diminished on L, no wheeze Abdomen:  Soft, NT, NABS Musculoskeletal:  Intact Skin:  Warm + le edema (chronic)   LABS: Cbc  Lab 07/24/12 0355 07/23/12 0450 07/22/12 0330  WBC 6.8 -- --  HGB 9.4* 10.0* 8.0*  HCT 28.2* 29.5* 23.5*  PLT 139* 123* 99*     Chemistry   Lab 07/24/12 0355 07/23/12 0450 07/22/12 0330  NA 132* 135 132*  K 4.1 4.1 4.3  CL 99 101 101  CO2 25 24 22   BUN 31* 30* 40*  CREATININE 1.01 0.90 1.09  CALCIUM 8.3* 8.0* 8.0*  MG -- -- 1.9  PHOS -- -- 2.2*  GLUCOSE 115* 105* 102*    Liver fxn  Lab 07/23/12 0450 07/20/12 1919  AST 14 16  ALT 13 17  ALKPHOS 86 117  BILITOT 1.2 1.0  PROT 4.8* 5.6*  ALBUMIN 1.8* 1.9*   coags  Lab 07/20/12 1919  APTT 36  INR 1.15   Sepsis markers No results found for this basename: LATICACIDVEN:3,PROCALCITON:3 in the last 168 hours Cardiac markers  Lab 07/19/12 1805  CKTOTAL --  CKMB --  TROPONINI <0.30   BNP  Lab 07/19/12 1805  PROBNP 2817.0*   ABG  Lab 07/22/12 0615 07/22/12 0328 07/21/12 1615  PHART 7.395 7.597* 7.513*  PCO2ART 38.6 25.7* 28.7*  PO2ART 91.0 248.0* 167.0*  HCO3 23.7 25.1* 22.9  TCO2 25 26 23.8    CBG trend  Lab 07/24/12 1248 07/23/12 2324 07/23/12 1828 07/23/12 1208 07/23/12 0821  GLUCAP 118* 120* 107* 135* 115*    CXR: Much improved aeration of  L lung   ECG:  DIAGNOSES: Principal Problem:  *Acute postprocedural respiratory failure Active Problems:  Necrotizing pneumonia, community acquired MRSA  Atelectasis of left lung  ESSENTIAL HYPERTENSION  COPD (chronic obstructive pulmonary disease)  CAD (coronary artery disease)  Dysphagia, pharyngeal  Pleural effusion  Atrial fibrillation  Pedal edema  Empyema of left pleural space    PLAN: Monitor in ICU post extubation Post op mgmt per TCTS Abx as above F/U culture data and adjust abx accordingly Plan for 4-6 wks IV abx - might be worth getting ID input for recs Cont chest percussion flutter /IS , ezpap  Cont rest of medical mgmt as is      Billy Fischer, MD;  PCCM service; Mobile [phone]

## 2012-07-24 NOTE — Progress Notes (Signed)
3 Days Post-Op Procedure(s) (LRB): VIDEO ASSISTED THORACOSCOPY (VATS)/EMPYEMA (Left) VIDEO BRONCHOSCOPY (Bilateral) Subjective: Status post decortication left empyema 2 chest tubes remained with output greater than 300 cc Chest x-ray shows improved aeration of left upper lobe today with ambulation and improved status promontory Operative cultures unrevealing continue Maxipime-vancomycin  Objective: Vital signs in last 24 hours: Temp:  [97.5 F (36.4 C)-98 F (36.7 C)] 98 F (36.7 C) (12/22 0753) Pulse Rate:  [73-121] 92  (12/22 0900) Cardiac Rhythm:  [-] Atrial fibrillation (12/22 0700) Resp:  [15-31] 19  (12/22 0900) BP: (91-113)/(47-66) 99/54 mmHg (12/22 0900) SpO2:  [98 %-100 %] 100 % (12/22 0900) Arterial Line BP: (90-116)/(41-61) 102/48 mmHg (12/21 1800) Weight:  [193 lb 2 oz (87.6 kg)] 193 lb 2 oz (87.6 kg) (12/22 0600)  Hemodynamic parameters for last 24 hours:   stable  Intake/Output from previous day: 12/21 0701 - 12/22 0700 In: 2332.3 [P.O.:120; I.V.:1650.3; IV Piggyback:562] Out: 1020 [Urine:790; Chest Tube:230] Intake/Output this shift: Total I/O In: 64 [IV Piggyback:64] Out: 88 [Urine:88]  Pleurx shows no airleak but persistent drainage we both chest tubes  Lab Results:  Basename 07/24/12 0355 07/23/12 0450  WBC 6.8 6.2  HGB 9.4* 10.0*  HCT 28.2* 29.5*  PLT 139* 123*   BMET:  Basename 07/24/12 0355 07/23/12 0450  NA 132* 135  K 4.1 4.1  CL 99 101  CO2 25 24  GLUCOSE 115* 105*  BUN 31* 30*  CREATININE 1.01 0.90  CALCIUM 8.3* 8.0*    PT/INR: No results found for this basename: LABPROT,INR in the last 72 hours ABG    Component Value Date/Time   PHART 7.395 07/22/2012 0615   HCO3 23.7 07/22/2012 0615   TCO2 25 07/22/2012 0615   ACIDBASEDEF 1.0 07/22/2012 0615   O2SAT 97.0 07/22/2012 0615   CBG (last 3)   Basename 07/23/12 2324 07/23/12 1828 07/23/12 1208  GLUCAP 120* 107* 135*    Assessment/Plan: S/P Procedure(s) (LRB): VIDEO ASSISTED  THORACOSCOPY (VATS)/EMPYEMA (Left) VIDEO BRONCHOSCOPY (Bilateral) Continue IV antibiotics for empyema, oral antibiotics failed in the past the patient will need PICC line and 6 weeks of IV antibiotics Patient will remain in ICU until primary status improves   VAN TRIGT III,Chanci Ojala 07/24/2012

## 2012-07-24 NOTE — Progress Notes (Signed)
3 Days Post-Op Procedure(s) (LRB): VIDEO ASSISTED THORACOSCOPY (VATS)/EMPYEMA (Left) VIDEO BRONCHOSCOPY (Bilateral) Subjective: Walked hallway Coughing up lots of sputum  Objective: Vital signs in last 24 hours: Temp:  [97.5 F (36.4 C)-98 F (36.7 C)] 97.6 F (36.4 C) (12/22 1251) Pulse Rate:  [73-110] 106  (12/22 1500) Cardiac Rhythm:  [-] Atrial fibrillation (12/22 1500) Resp:  [15-31] 25  (12/22 1500) BP: (88-117)/(47-75) 117/62 mmHg (12/22 1500) SpO2:  [91 %-100 %] 91 % (12/22 1500) Arterial Line BP: (90-102)/(41-52) 102/48 mmHg (12/21 1800) Weight:  [193 lb 2 oz (87.6 kg)] 193 lb 2 oz (87.6 kg) (12/22 0600)  Hemodynamic parameters for last 24 hours:  stable  Intake/Output from previous day: 12/21 0701 - 12/22 0700 In: 2332.3 [P.O.:120; I.V.:1650.3; IV Piggyback:562] Out: 1020 [Urine:790; Chest Tube:230] Intake/Output this shift: Total I/O In: 1619.6 [P.O.:1020; I.V.:533.6; IV Piggyback:66] Out: 318 [Urine:318]    Lab Results:  [facility] 07/24/12 0355 07/23/12 0450  WBC 6.8 6.2  HGB 9.4* 10.0*  HCT 28.2* 29.5*  PLT 139* 123*   BMET:  Basename 07/24/12 0355 07/23/12 0450  NA 132* 135  K 4.1 4.1  CL 99 101  CO2 25 24  GLUCOSE 115* 105*  BUN 31* 30*  CREATININE 1.01 0.90  CALCIUM 8.3* 8.0*    PT/INR: No results found for this basename: LABPROT,INR in the last 72 hours ABG    Component Value Date/Time   PHART 7.395 07/22/2012 0615   HCO3 23.7 07/22/2012 0615   TCO2 25 07/22/2012 0615   ACIDBASEDEF 1.0 07/22/2012 0615   O2SAT 97.0 07/22/2012 0615   CBG (last 3)   Basename 07/24/12 1248 07/23/12 2324 07/23/12 1828  GLUCAP 118* 120* 107*    Assessment/Plan: S/P Procedure(s) (LRB): VIDEO ASSISTED THORACOSCOPY (VATS)/EMPYEMA (Left) VIDEO BRONCHOSCOPY (Bilateral) Cont Ab"s, ambulation,diuresis,ensure   LOS: 5 days    VAN TRIGT III,Janai Maudlin 07/24/2012

## 2012-07-25 ENCOUNTER — Encounter (HOSPITAL_COMMUNITY): Payer: Self-pay | Admitting: Thoracic Surgery (Cardiothoracic Vascular Surgery)

## 2012-07-25 ENCOUNTER — Inpatient Hospital Stay (HOSPITAL_COMMUNITY): Payer: Medicare Other

## 2012-07-25 DIAGNOSIS — J90 Pleural effusion, not elsewhere classified: Secondary | ICD-10-CM

## 2012-07-25 DIAGNOSIS — J852 Abscess of lung without pneumonia: Secondary | ICD-10-CM

## 2012-07-25 LAB — BASIC METABOLIC PANEL
BUN: 28 mg/dL — ABNORMAL HIGH (ref 6–23)
CO2: 26 mEq/L (ref 19–32)
Calcium: 8.3 mg/dL — ABNORMAL LOW (ref 8.4–10.5)
Chloride: 95 mEq/L — ABNORMAL LOW (ref 96–112)
Creatinine, Ser: 1.08 mg/dL (ref 0.50–1.35)
GFR calc Af Amer: 74 mL/min — ABNORMAL LOW (ref >90)
GFR calc non Af Amer: 64 mL/min — ABNORMAL LOW (ref >90)
Glucose, Bld: 111 mg/dL — ABNORMAL HIGH (ref 70–99)
Potassium: 4 mEq/L (ref 3.5–5.1)
Sodium: 126 mEq/L — ABNORMAL LOW (ref 135–145)

## 2012-07-25 LAB — CBC
HCT: 28.3 % — ABNORMAL LOW (ref 39.0–52.0)
Hemoglobin: 9.5 g/dL — ABNORMAL LOW (ref 13.0–17.0)
MCH: 29.4 pg (ref 26.0–34.0)
MCHC: 33.6 g/dL (ref 30.0–36.0)
MCV: 87.6 fL (ref 78.0–100.0)
Platelets: 164 10*3/uL (ref 150–400)
RBC: 3.23 MIL/uL — ABNORMAL LOW (ref 4.22–5.81)
RDW: 13.3 % (ref 11.5–15.5)
WBC: 6.7 10*3/uL (ref 4.0–10.5)

## 2012-07-25 LAB — GLUCOSE, CAPILLARY
Glucose-Capillary: 109 mg/dL — ABNORMAL HIGH (ref 70–99)
Glucose-Capillary: 122 mg/dL — ABNORMAL HIGH (ref 70–99)
Glucose-Capillary: 95 mg/dL (ref 70–99)

## 2012-07-25 MED ORDER — TIGECYCLINE 50 MG IV SOLR
100.0000 mg | Freq: Once | INTRAVENOUS | Status: AC
  Administered 2012-07-25: 100 mg via INTRAVENOUS
  Filled 2012-07-25: qty 100

## 2012-07-25 MED ORDER — TIGECYCLINE 50 MG IV SOLR
50.0000 mg | Freq: Two times a day (BID) | INTRAVENOUS | Status: DC
  Administered 2012-07-26 – 2012-07-29 (×8): 50 mg via INTRAVENOUS
  Filled 2012-07-25 (×9): qty 100

## 2012-07-25 NOTE — Clinical Documentation Improvement (Signed)
CHF DOCUMENTATION CLARIFICATION QUERY  THIS DOCUMENT IS NOT A PERMANENT PART OF THE MEDICAL RECORD  TO RESPOND TO THE THIS QUERY, FOLLOW THE INSTRUCTIONS BELOW:  1. If needed, update documentation for the patient's encounter via the notes activity.  2. Access this query again and click edit on the In [employer].  3. After updating, or not, click F2 to complete all highlighted (required) fields concerning your review. Select "additional documentation in the medical record" OR "no additional documentation provided".  4. Click Sign note button.  5. The deficiency will fall out of your In Basket *Please let us know if you are not able to complete this workflow by phone or e-mail (listed below).  Please update your documentation within the medical record to reflect your response to this query.                                                                                    07/25/12  Dear Dr. Dorris Fetch, Kathie Rhodes or Lowella Dandy, PA  Associates, (re-issued on 07/26/12 at [phone]) In a better effort to capture your patient's severity of illness, reflect appropriate length of stay and utilization of resources, a review of the patient medical record has revealed the following indicators the diagnosis of Heart Failure.    Based on your clinical judgment, please clarify and document in a progress note and/or discharge summary the clinical condition associated with the following supporting information:  In responding to this query please exercise your independent judgment.  The fact that a query is asked, does not imply that any particular answer is desired or expected.  Based on your clinical judgment can you provide a diagnosis that represents the below listed clinical indicators?  In this patient admitted with Necrotizing PNA/Empyema a review of the medical record reveals the following:   HP states:    Clinically, he has no features of CHF at this time, and known EF is  55%-60%  CXR  Cardiomegaly and congestive heart failure with slight increase in diffuse generalized edema  BNP  2817.0  Treatment:  Lasix  XOPENEX  Clarification Needed   Please clarify if pt with CHF and please note acuity and type in the progress notes or d/c summary.   Possible Clinical Conditions?   Chronic Systolic Congestive Heart Failure Chronic Diastolic Congestive Heart Failure Chronic Systolic & Diastolic Congestive Heart Failure Acute Systolic Congestive Heart Failure Acute Diastolic Congestive Heart Failure Acute Systolic & Diastolic Congestive Heart Failure Acute on Chronic Systolic Congestive Heart Failure Acute on Chronic Diastolic Congestive Heart Failure Acute on Chronic Systolic & Diastolic  Congestive Heart Failure Other Condition________________________________________ Cannot Clinically Determine  Supporting Information:  Risk Factors Pleural Effusion, necrotizing pna, Community acquired MRSA, COPD exacerbation,   Signs & Symptoms:  The cardiomegaly and congestive heart failure with slight increase in diffuse generalized edema Pedal edema with fluid overload   Diagnostics: EKG 07/19/12 AFIB  CXR 07/22/12 The cardiomegaly and congestive heart failure with slight increase in diffuse generalized edema   Component     Latest Ref Rng 07/19/2012          Pro B Natriuretic peptide (BNP)     0 -  450 pg/mL 2817.0 (H)   Treatment: Lasix  Reviewed:  no additional documentation provided ljh  Thank You,  Enis Slipper  RN, BSN, MSN/Inf, CCDS Clinical Documentation Specialist [facility] Dept Pager: [phone] / E-mail: [email or web address]  Health Information Management [place]

## 2012-07-25 NOTE — Progress Notes (Signed)
[facility] BRIEF SICU PROGRESS NOTE  4 Days Post-Op  S/P Procedure(s) (LRB): VIDEO ASSISTED THORACOSCOPY (VATS)/EMPYEMA (Left) VIDEO BRONCHOSCOPY (Bilateral)   Stable day  Plan: Continue current plan  OWEN,CLARENCE H 07/25/2012 9:09 PM

## 2012-07-25 NOTE — Progress Notes (Signed)
Patient due for Chest Vest at this time but refused due to nausea. RT informed pt she would return at 2 for breathing treatment and would postpone chest vest until then. Pt in agreement. RN made aware. RT will continue to monitor.

## 2012-07-25 NOTE — Consult Note (Signed)
[facility]    Date of Admission:  07/19/2012  Date of Consult:  07/25/2012  Reason for Consult:S Maltophila HCAP, Necrotizing PNA Referring Physician: Dr. Marchelle Gearing   HPI: Christian [name] is an 78 y.o. male as well as her significant for coronary disease atrial flutter COPD he was admitted to [facility] long on 06/27/12 for Necrotizing pneumonia do to community acquired MRSA, with acute on chronic respiratory failure with hypoxia: chest x-ray showed new patchy multifocal pneumonia despite being on antibiotics. Patient was then placed on broad-spectrum antibiotics with vancomycin and Zosyn. Required BIPAP support briefly. CT of the chest was done which showed extensive bilateral cavitary pneumonia with areas of necrosis with endobronchial material occluding the left lower lobe bronchus. Pulmonology consultation was obtained. Sputum culture showed a moderate MRSA sensitive to vancomycin. Antibiotics were changed to oral Augmentin and Zyvox for polymicrobial coverage in addition to the pulmonary toileting per pulmonary recommendations. We were consulted via phone and rcommmended an additional 4 weeks of antiboitics and pt was continue on oral zyvox and augmentin on DC to SNF. He was seen in fu with LB CCM  And was worse. CT showed   IMPRESSION:  1. Filling defect within the distal left main stem bronchus  representing either endobronchial neoplasm versus mucous plugging.   2. Complete filling of the left hemithorax with pleural fluid and  atelectatic lung which contracts centrally toward the hilum.  3. Small right effusion and right lower lobe consolidation. The  effusion is new while the consolidation is improved.     He was admitted and placed on vancomycin and cefepime taken to the OR on  [id number] and underwent vats and decortication of his left-sided empyema. On bronchoscopy he had mucous plugging in the left side and this was removed and cultures were sent. Asian  is continued on his vancomycin and cefepime and now is growing Stenotrophomonas maltophilia a few colonies from the cultures taken at during the bronchoscopy. He does consist continue to have infiltrates especially left right and has had some fevers. We're consulted to assist with the management work up of this patient with possible S. maltophilia healthcare associated pneumonia (versus colonization) and necrotizing MRSA pneumonia with empyema. I spent greater than 60 minutes with the patient including greater than 50% of time in face to face counsel of the patient and in coordination of their care.     Past Medical History  Diagnosis Date  . Allergic rhinitis   . COPD (chronic obstructive pulmonary disease)   . Disorders of diaphragm   . Acute bronchitis   . Hypertension   . CAD (coronary artery disease)     Catheterization 2009, mild coronary disease (after abnormal nuclear study,With hypotensive response to exercise, 2009)  . RBBB   . Atrial flutter     Ablated in the past, no recurrence  . Venous insufficiency   . Edema   . Dyslipidemia     Patient has an and to use statin  . GERD (gastroesophageal reflux disease)   . Diverticulosis of colon   . Hypertrophy of prostate with urinary obstruction and other lower urinary tract symptoms (LUTS)   . Increased prostate specific antigen (PSA) velocity   . DJD (degenerative joint disease)   . Chronic insomnia   . Malignant melanoma     Removed from mediastinum via thoracotomy July, 2010  . Ejection fraction     EF 60%,Echo, 2009,  /  EF 55-60%, echo, February, 2013  .  Aortic stenosis     Mild, echo and catheterization, 2009 /  Mild, echo, February, 2013  . Mitral stenosis     Very mild, echo, from mitral annular calcification  . Shortness of breath     Episodes of feeling shortness of breath with some mild dizziness with mild exertion., February, 2013  . Tricuspid regurgitation     Moderate, echo, February, 2013, 56 mmHg  . Pulmonary  hypertension     56 mmHg, echo, February, 2013  . Pneumonia     Past Surgical History  Procedure Date  . Bilateral inguinal hernia repair   . Umbilical hernia repair   . Median sternotomy for mediastinal melanoma 1980s  ergies:   Allergies  Allergen Reactions  . Ivp Dye (Iodinated Diagnostic Agents)      Medications: I have reviewed patients current medications as documented in Epic Anti-infectives     Start     Dose/Rate Route Frequency Ordered Stop   07/26/12 0000   tigecycline (TYGACIL) 50 mg in sodium chloride 0.9 % 100 mL IVPB        50 mg 200 mL/hr over 30 Minutes Intravenous Every 12 hours 07/25/12 1134     07/25/12 1200   tigecycline (TYGACIL) 100 mg in sodium chloride 0.9 % 100 mL IVPB        100 mg 200 mL/hr over 30 Minutes Intravenous  Once 07/25/12 1134 07/25/12 1244   07/22/12 1000   ceFEPIme (MAXIPIME) 1 g in dextrose 5 % 50 mL IVPB  Status:  Discontinued        1 g 100 mL/hr over 30 Minutes Intravenous Every 8 hours 07/22/12 0937 07/25/12 1133   07/20/12 2200   levofloxacin (LEVAQUIN) IVPB 750 mg        750 mg 100 mL/hr over 90 Minutes Intravenous Every 24 hours 07/20/12 0759 07/22/12 0103   07/20/12 1800   vancomycin (VANCOCIN) IVPB 1000 mg/200 mL premix  Status:  Discontinued        1,000 mg 200 mL/hr over 60 Minutes Intravenous Every 12 hours 07/20/12 0756 07/25/12 1133   07/20/12 0900   ceFEPIme (MAXIPIME) 1 g in dextrose 5 % 50 mL IVPB  Status:  Discontinued        1 g 100 mL/hr over 30 Minutes Intravenous Every 12 hours 07/20/12 0758 07/22/12 0937   07/19/12 1800   ceFEPIme (MAXIPIME) 1 g in dextrose 5 % 50 mL IVPB  Status:  Discontinued        1 g 100 mL/hr over 30 Minutes Intravenous 3 times per day 07/19/12 1715 07/19/12 1736   07/19/12 1800   ceFEPIme (MAXIPIME) 1 g in dextrose 5 % 50 mL IVPB  Status:  Discontinued        1 g 100 mL/hr over 30 Minutes Intravenous Every 24 hours 07/19/12 1736 07/20/12 0758   07/19/12 1800   levofloxacin  (LEVAQUIN) IVPB 750 mg  Status:  Discontinued        750 mg 100 mL/hr over 90 Minutes Intravenous Every 48 hours 07/19/12 1738 07/20/12 0759   07/19/12 1800   vancomycin (VANCOCIN) 750 mg in sodium chloride 0.9 % 150 mL IVPB  Status:  Discontinued        750 mg 150 mL/hr over 60 Minutes Intravenous Every 12 hours 07/19/12 1738 07/20/12 0756   07/19/12 1730   levofloxacin (LEVAQUIN) IVPB 750 mg  Status:  Discontinued        750 mg 100 mL/hr over 90 Minutes Intravenous  Every 24 hours 07/19/12 1715 07/19/12 1737          Social History:  reports that he quit smoking about 52 years ago. He has never used smokeless tobacco. He reports that he does not drink alcohol or use illicit drugs.  History reviewed. No pertinent family history.  As in HPI and primary teams notes otherwise 12 point review of systems is negative  Blood pressure 101/54, pulse 60, temperature 98.7 F (37.1 C), temperature source Oral, resp. rate 14, height 6' (1.829 m), weight 199 lb 1.2 oz (90.3 kg), SpO2 100.00%. General: Alert and awake, oriented  not in any acute distress. HEENT: anicteric sclera, pupils reactive to light and accommodation, EOMI, oropharynx clear and without exudate CVS regular rate, normal r,  no murmur rubs or gallops Chest:rhonchi throughout, chest tube in place with sanguinous material  Abdomen: soft nontender, nondistended, normal bowel sounds, Extremities: no  clubbing or edema noted bilaterally Skin: no rashes Neuro: nonfocal, strength and sensation intact   Results for orders placed during the hospital encounter of 07/19/12 (from the past 48 hour(s))  VANCOMYCIN, TROUGH     Status: Abnormal   Collection Time   07/23/12  5:30 PM      Component Value Range Comment   Vancomycin Tr 20.5 (*) 10.0 - 20.0 ug/mL   GLUCOSE, CAPILLARY     Status: Abnormal   Collection Time   07/23/12  6:28 PM      Component Value Range Comment   Glucose-Capillary 107 (*) 70 - 99 mg/dL    Comment 1  Documented in Chart      Comment 2 Notify RN     GLUCOSE, CAPILLARY     Status: Abnormal   Collection Time   07/23/12 11:24 PM      Component Value Range Comment   Glucose-Capillary 120 (*) 70 - 99 mg/dL   CBC     Status: Abnormal   Collection Time   07/24/12  3:55 AM      Component Value Range Comment   WBC 6.8  4.0 - 10.5 K/uL    RBC 3.18 (*) 4.22 - 5.81 MIL/uL    Hemoglobin 9.4 (*) 13.0 - 17.0 g/dL    HCT 16.1 (*) 09.6 - 52.0 %    MCV 88.7  78.0 - 100.0 fL    MCH 29.6  26.0 - 34.0 pg    MCHC 33.3  30.0 - 36.0 g/dL    RDW 04.5  40.9 - 81.1 %    Platelets 139 (*) 150 - 400 K/uL   BASIC METABOLIC PANEL     Status: Abnormal   Collection Time   07/24/12  3:55 AM      Component Value Range Comment   Sodium 132 (*) 135 - 145 mEq/L    Potassium 4.1  3.5 - 5.1 mEq/L    Chloride 99  96 - 112 mEq/L    CO2 25  19 - 32 mEq/L    Glucose, Bld 115 (*) 70 - 99 mg/dL    BUN 31 (*) 6 - 23 mg/dL    Creatinine, Ser 9.14  0.50 - 1.35 mg/dL    Calcium 8.3 (*) 8.4 - 10.5 mg/dL    GFR calc non Af Amer 69 (*) >90 mL/min    GFR calc Af Amer 80 (*) >90 mL/min   GLUCOSE, CAPILLARY     Status: Abnormal   Collection Time   07/24/12 12:48 PM      Component Value Range Comment  Glucose-Capillary 118 (*) 70 - 99 mg/dL    Comment 1 Documented in Chart      Comment 2 Notify RN     GLUCOSE, CAPILLARY     Status: Abnormal   Collection Time   07/24/12  5:51 PM      Component Value Range Comment   Glucose-Capillary 136 (*) 70 - 99 mg/dL    Comment 1 Documented in Chart      Comment 2 Notify RN     GLUCOSE, CAPILLARY     Status: Abnormal   Collection Time   07/25/12 12:10 AM      Component Value Range Comment   Glucose-Capillary 109 (*) 70 - 99 mg/dL   BASIC METABOLIC PANEL     Status: Abnormal   Collection Time   07/25/12  4:00 AM      Component Value Range Comment   Sodium 126 (*) 135 - 145 mEq/L    Potassium 4.0  3.5 - 5.1 mEq/L    Chloride 95 (*) 96 - 112 mEq/L    CO2 26  19 - 32 mEq/L     Glucose, Bld 111 (*) 70 - 99 mg/dL    BUN 28 (*) 6 - 23 mg/dL    Creatinine, Ser 3.47  0.50 - 1.35 mg/dL    Calcium 8.3 (*) 8.4 - 10.5 mg/dL    GFR calc non Af Amer 64 (*) >90 mL/min    GFR calc Af Amer 74 (*) >90 mL/min   CBC     Status: Abnormal   Collection Time   07/25/12  4:00 AM      Component Value Range Comment   WBC 6.7  4.0 - 10.5 K/uL    RBC 3.23 (*) 4.22 - 5.81 MIL/uL    Hemoglobin 9.5 (*) 13.0 - 17.0 g/dL    HCT 42.5 (*) 95.6 - 52.0 %    MCV 87.6  78.0 - 100.0 fL    MCH 29.4  26.0 - 34.0 pg    MCHC 33.6  30.0 - 36.0 g/dL    RDW 38.7  56.4 - 33.2 %    Platelets 164  150 - 400 K/uL   GLUCOSE, CAPILLARY     Status: Normal   Collection Time   07/25/12  5:58 AM      Component Value Range Comment   Glucose-Capillary 95  70 - 99 mg/dL   GLUCOSE, CAPILLARY     Status: Abnormal   Collection Time   07/25/12 11:35 AM      Component Value Range Comment   Glucose-Capillary 122 (*) 70 - 99 mg/dL       Component Value Date/Time   SDES TISSUE 07/21/2012 1301   SDES TISSUE 07/21/2012 1301   SDES TISSUE 07/21/2012 1301   SDES FLUID PLEURAL LEFT 07/21/2012 1301   SDES FLUID PLEURAL LEFT 07/21/2012 1301   SDES FLUID LEFT PLEURAL 07/21/2012 1301   SPECREQUEST LEFT PLEURAL PEEL PT ON MAXIPIME VANCOMYCIN 07/21/2012 1301   SPECREQUEST LEFT PLEURAL PEEL PT ON MAXIPIME VANCOMYCIN 07/21/2012 1301   SPECREQUEST LEFT PLEURAL PEEL PT ON MAXIPIME VANCOMYCIN 07/21/2012 1301   SPECREQUEST PATIENT ON FOLLOWING MAXIPIME,VANCOMYCIN 07/21/2012 1301   SPECREQUEST PATIENT ON FOLLOWING MAXIPIME,VANCOMYCIN 07/21/2012 1301   SPECREQUEST PT ON MAXIPIME VANCOMYCIN 07/21/2012 1301   CULT NO GROWTH 3 DAYS 07/21/2012 1301   CULT CULTURE WILL BE EXAMINED FOR 6 WEEKS BEFORE ISSUING A FINAL REPORT 07/21/2012 1301   CULT CULTURE IN PROGRESS FOR FOUR WEEKS 07/21/2012 1301  CULT CULTURE WILL BE EXAMINED FOR 6 WEEKS BEFORE ISSUING A FINAL REPORT 07/21/2012 1301   CULT CULTURE IN PROGRESS FOR FOUR WEEKS  07/21/2012 1301   CULT NO GROWTH 3 DAYS 07/21/2012 1301   REPTSTATUS 07/24/2012 FINAL 07/21/2012 1301   REPTSTATUS PENDING 07/21/2012 1301   REPTSTATUS PENDING 07/21/2012 1301   REPTSTATUS PENDING 07/21/2012 1301   REPTSTATUS PENDING 07/21/2012 1301   REPTSTATUS 07/24/2012 FINAL 07/21/2012 1301   Dg Chest Port 1 View  07/25/2012  *RADIOLOGY REPORT*  Clinical Data: History of empyema and post VATS.  PORTABLE CHEST - 1 VIEW  Comparison: 07/24/2012  Findings: Stable position of the left chest tubes.  Again noted is marked volume loss in the left hemithorax with persistent interstitial densities.  There are right basilar densities that may represent a combination of pleural fluid and atelectasis. Difficult to exclude mild edema.  The heart and mediastinum are grossly stable.  PICC line tip in the lower SVC region. No definite pneumothorax.  IMPRESSION: Minimal change since the previous examination.  Volume loss and interstitial densities in the left lung.  Persistent right basilar densities as described.   Original Report Authenticated By: Richarda Overlie, M.D.    Dg Chest Port 1 View  07/24/2012  *RADIOLOGY REPORT*  Clinical Data: Empyema  PORTABLE CHEST - 1 VIEW  Comparison: 07/23/2012  Findings: Cardiomediastinal silhouette is stable.  Stable left chest tubes position.  Improvement in aeration in the left upper lobe with decreasing left pleural effusion.  Stable right pleural effusion with right lower lobe atelectasis or infiltrate. Persistent small left pleural effusion with left lower lobe atelectasis or infiltrate. Stable right arm PICC line position.  No diagnostic pneumothorax. No pulmonary edema.  IMPRESSION: Stable left chest tubes position.  Improvement in aeration in the left upper lobe with decreasing left pleural effusion.  Stable right pleural effusion with right lower lobe atelectasis or infiltrate.  Persistent small left pleural effusion with left lower lobe atelectasis or infiltrate. Stable  right arm PICC line position.  No diagnostic pneumothorax.   Original Report Authenticated By: Natasha Mead, M.D.      Recent Results (from the past 720 hour(s))  CULTURE, RESPIRATORY     Status: Normal   Collection Time   07/01/12 11:20 PM      Component Value Range Status Comment   Specimen Description SPUTUM   Final    Special Requests NONE   Final    Gram Stain     Final    Value: ABUNDANT WBC PRESENT,BOTH PMN AND MONONUCLEAR     MODERATE SQUAMOUS EPITHELIAL CELLS PRESENT     MODERATE GRAM POSITIVE COCCI IN CLUSTERS     IN PAIRS IN CHAINS RARE GRAM POSITIVE RODS   Culture     Final    Value: MODERATE METHICILLIN RESISTANT STAPHYLOCOCCUS AUREUS     Note: RIFAMPIN AND GENTAMICIN SHOULD NOT BE USED AS SINGLE DRUGS FOR TREATMENT OF STAPH INFECTIONS. This organism DOES NOT demonstrate inducible Clindamycin resistance in vitro. CRITICAL RESULT CALLED TO, READ BACK BY AND VERIFIED WITH: REEVES RN 9AM      07/04/12 GUSTK   Report Status 07/04/2012 FINAL   Final    Organism ID, Bacteria METHICILLIN RESISTANT STAPHYLOCOCCUS AUREUS   Final   MRSA PCR SCREENING     Status: Abnormal   Collection Time   07/02/12 10:58 AM      Component Value Range Status Comment   MRSA by PCR POSITIVE (*) NEGATIVE Final   CLOSTRIDIUM DIFFICILE BY PCR  Status: Normal   Collection Time   07/07/12  1:05 PM      Component Value Range Status Comment   C difficile by pcr NEGATIVE  NEGATIVE Final   BODY FLUID CULTURE     Status: Normal   Collection Time   07/20/12 11:24 AM      Component Value Range Status Comment   Specimen Description FLUID PLEURAL   Final    Special Requests NONE   Final    Gram Stain     Final    Value: NO WBC SEEN     NO ORGANISMS SEEN   Culture NO GROWTH 3 DAYS   Final    Report Status 07/23/2012 FINAL   Final   MRSA PCR SCREENING     Status: Abnormal   Collection Time   07/20/12  5:31 PM      Component Value Range Status Comment   MRSA by PCR POSITIVE (*) NEGATIVE Final   CULTURE,  RESPIRATORY     Status: Normal   Collection Time   07/21/12 12:13 PM      Component Value Range Status Comment   Specimen Description BRONCHIAL WASHINGS   Final    Special Requests PATIENT ON FOLLOWING MAXIPIME,VANCOMYCIN   Final    Gram Stain     Final    Value: FEW WBC PRESENT, PREDOMINANTLY PMN     NO SQUAMOUS EPITHELIAL CELLS SEEN     NO ORGANISMS SEEN   Culture FEW STENOTROPHOMONAS MALTOPHILIA   Final    Report Status 07/25/2012 FINAL   Final    Organism ID, Bacteria STENOTROPHOMONAS MALTOPHILIA   Final   AFB CULTURE WITH SMEAR     Status: Normal (Preliminary result)   Collection Time   07/21/12 12:13 PM      Component Value Range Status Comment   Specimen Description BRONCHIAL WASHINGS   Final    Special Requests PATIENT ON FOLLOWING MAXIPIME,VANCOMYCIN   Final    ACID FAST SMEAR NO ACID FAST BACILLI SEEN   Final    Culture     Final    Value: CULTURE WILL BE EXAMINED FOR 6 WEEKS BEFORE ISSUING A FINAL REPORT   Report Status PENDING   Incomplete   FUNGUS CULTURE W SMEAR     Status: Normal (Preliminary result)   Collection Time   07/21/12 12:13 PM      Component Value Range Status Comment   Specimen Description BRONCHIAL WASHINGS   Final    Special Requests PATIENT ON FOLLOWING MAXIPIME,VANCOMYCIN   Final    Fungal Smear NO YEAST OR FUNGAL ELEMENTS SEEN   Final    Culture CULTURE IN PROGRESS FOR FOUR WEEKS   Final    Report Status PENDING   Incomplete   TISSUE CULTURE     Status: Normal   Collection Time   07/21/12  1:01 PM      Component Value Range Status Comment   Specimen Description TISSUE   Final    Special Requests LEFT PLEURAL PEEL PT ON MAXIPIME VANCOMYCIN   Final    Gram Stain     Final    Value: RARE WBC PRESENT,BOTH PMN AND MONONUCLEAR     NO ORGANISMS SEEN   Culture NO GROWTH 3 DAYS   Final    Report Status 07/24/2012 FINAL   Final   AFB CULTURE WITH SMEAR     Status: Normal (Preliminary result)   Collection Time   07/21/12  1:01 PM  Component Value  Range Status Comment   Specimen Description TISSUE   Final    Special Requests LEFT PLEURAL PEEL PT ON MAXIPIME VANCOMYCIN   Final    ACID FAST SMEAR NO ACID FAST BACILLI SEEN   Final    Culture     Final    Value: CULTURE WILL BE EXAMINED FOR 6 WEEKS BEFORE ISSUING A FINAL REPORT   Report Status PENDING   Incomplete   FUNGUS CULTURE W SMEAR     Status: Normal (Preliminary result)   Collection Time   07/21/12  1:01 PM      Component Value Range Status Comment   Specimen Description TISSUE   Final    Special Requests LEFT PLEURAL PEEL PT ON MAXIPIME VANCOMYCIN   Final    Fungal Smear NO YEAST OR FUNGAL ELEMENTS SEEN   Final    Culture CULTURE IN PROGRESS FOR FOUR WEEKS   Final    Report Status PENDING   Incomplete   AFB CULTURE WITH SMEAR     Status: Normal (Preliminary result)   Collection Time   07/21/12  1:01 PM      Component Value Range Status Comment   Specimen Description FLUID PLEURAL LEFT   Final    Special Requests PATIENT ON FOLLOWING MAXIPIME,VANCOMYCIN   Final    ACID FAST SMEAR NO ACID FAST BACILLI SEEN   Final    Culture     Final    Value: CULTURE WILL BE EXAMINED FOR 6 WEEKS BEFORE ISSUING A FINAL REPORT   Report Status PENDING   Incomplete   FUNGUS CULTURE W SMEAR     Status: Normal (Preliminary result)   Collection Time   07/21/12  1:01 PM      Component Value Range Status Comment   Specimen Description FLUID PLEURAL LEFT   Final    Special Requests PATIENT ON FOLLOWING MAXIPIME,VANCOMYCIN   Final    Fungal Smear NO YEAST OR FUNGAL ELEMENTS SEEN   Final    Culture CULTURE IN PROGRESS FOR FOUR WEEKS   Final    Report Status PENDING   Incomplete   BODY FLUID CULTURE     Status: Normal   Collection Time   07/21/12  1:01 PM      Component Value Range Status Comment   Specimen Description FLUID LEFT PLEURAL   Final    Special Requests PT ON MAXIPIME VANCOMYCIN   Final    Gram Stain     Final    Value: RARE WBC PRESENT,BOTH PMN AND MONONUCLEAR     NO ORGANISMS  SEEN   Culture NO GROWTH 3 DAYS   Final    Report Status 07/24/2012 FINAL   Final      Impression/Recommendation  31 -year-old man with multiple medical problems including COPD with recent admission for necrotizing MRSA pneumonia and empyema readmitted to hospital and now status post VAT with decortication of the left side did empyema and also now with S. maltophilia having been cultured on bronchoscopy with question of whether or not this is causing a healthcare associated PNA on top of his prior severe MRSA infection  #1 S. Maltophila : This could be a colonizer and was present in only a few colonies on culture. However I assume this came from the large amount of mucous that was occluding bronchus given that fact, recent fevers and CXR changes will go ahead and rx as being an HCAP. --will start Tygacil 100mg  iv x 1 now and 50mg   IV q 12 hours --will ask lab to check Tygacil sensis --organism was sensitive to Timentin which would be option but might need pharmacy to procure this abx as it is not typicall on shelves --if worsens would get more endobroncchial cultures and consider repeat Bronchoscopy  --would consider a 14 day course of therapy  #2 Necrotizing PNA: due to MRSA. TYgacil will cover this as well (was tetracycline Sensitive): Would give him a protracted course of active abx against this organism once IV abx finished.    Thank you so much for this interesting consult  [facility] [phone] (pager) [phone] (office) 07/25/2012, 1:07 PM  Paulette Blanch Dam 07/25/2012, 1:07 PM

## 2012-07-25 NOTE — Progress Notes (Addendum)
4 Days Post-Op Procedure(s) (LRB): VIDEO ASSISTED THORACOSCOPY (VATS)/EMPYEMA (Left) VIDEO BRONCHOSCOPY (Bilateral) Subjective:  Christian [name] complains of some shortness of breath this morning.  He continues to have a productive cough.  He also has a very low grade fever this morning. +BM  Objective: Vital signs in last 24 hours: Temp:  [97.6 F (36.4 C)-99.7 F (37.6 C)] 99.7 F (37.6 C) (12/23 0400) Pulse Rate:  [74-112] 105  (12/23 0800) Cardiac Rhythm:  [-] Atrial fibrillation (12/23 0800) Resp:  [17-26] 19  (12/23 0800) BP: (90-117)/(48-75) 102/67 mmHg (12/23 0700) SpO2:  [87 %-100 %] 100 % (12/23 0800) Weight:  [199 lb 1.2 oz (90.3 kg)] 199 lb 1.2 oz (90.3 kg) (12/23 0600)  Intake/Output from previous day: 12/22 0701 - 12/23 0700 In: 2849.6 [P.O.:1200; I.V.:1333.6; IV Piggyback:316] Out: 1083 [Urine:913; Chest Tube:170]  General appearance: alert, cooperative and no distress Heart: regular rate and rhythm Lungs: diminished bilaterally, upper airway wheezing on expiration Abdomen: soft, non-tender; bowel sounds normal; no masses,  no organomegaly Wound: clean and dry  Lab Results:  Basename 07/25/12 0400 07/24/12 0355  WBC 6.7 6.8  HGB 9.5* 9.4*  HCT 28.3* 28.2*  PLT 164 139*   BMET:  Basename 07/25/12 0400 07/24/12 0355  NA 126* 132*  K 4.0 4.1  CL 95* 99  CO2 26 25  GLUCOSE 111* 115*  BUN 28* 31*  CREATININE 1.08 1.01  CALCIUM 8.3* 8.3*    PT/INR: No results found for this basename: LABPROT,INR in the last 72 hours ABG    Component Value Date/Time   PHART 7.395 07/22/2012 0615   HCO3 23.7 07/22/2012 0615   TCO2 25 07/22/2012 0615   ACIDBASEDEF 1.0 07/22/2012 0615   O2SAT 97.0 07/22/2012 0615   CBG (last 3)   Basename 07/25/12 0558 07/25/12 0010 07/24/12 1751  GLUCAP 95 109* 136*    Assessment/Plan: S/P Procedure(s) (LRB): VIDEO ASSISTED THORACOSCOPY (VATS)/EMPYEMA (Left) VIDEO BRONCHOSCOPY (Bilateral)  1. Chest tubes in place- no air leak  present, 170cc output present 2. Pulm- CXR shows worsening left sided pleural effusion/atelectasis, + coughing up large amount of sputum- CCM following 3. Emypema- On IV Vanc, low grade temp this morning 4. Dispo- continue pulmonary care, worsening pleural effusion likely loculated leave chest tubes for now   LOS: 6 days    Christian [name], Christian [name] 07/25/2012     Chart reviewed, patient examined, agree with above. CXR looks stable. The left lung still has significant consolidation but that will get better with time. There is some pleural edema and thickening. I doubt that there is any significant effusion. Continue current treatment. Agree with diuresis at this time. Keep chest tubes today.

## 2012-07-25 NOTE — Progress Notes (Signed)
PULMONARY  / CRITICAL CARE MEDICINE  Name: Christian [name] MRN: [id number] DOB: 29-Nov-1931    LOS: 6  REFERRING MD :  [facility] PCP: Kriste Basque CHIEF COMPLAINT:  Loculated left pleural effusion/empyema    BRIEF PATIENT DESCRIPTION:  78 yo male, PCP of [name], seen in office 12-17 with increased effusion. He was discharged from [facility] 11/25 to 12-10 to [facility] after treatment for MRSA necrotizing pna.  VATS/decortication 12/19 > empyema  LINES / TUBES: R IJ CVL 12/19 >> 12/20  ETT 12/19 >> 12/20 PICC 12/20 >>   CULTURES: 12/18 MRSA PCR >> POS 12/19 pleural fluid >> NEG 12/19 Bronch washings - STENOTROPHOMONAS (resistant to bactrim, intermediate to Ceftaz and Levoflox)  Results for orders placed during the hospital encounter of 07/19/12  BODY FLUID CULTURE     Status: Normal   Collection Time   07/20/12 11:24 AM      Component Value Range Status Comment   Specimen Description FLUID PLEURAL   Final    Special Requests NONE   Final    Gram Stain     Final    Value: NO WBC SEEN     NO ORGANISMS SEEN   Culture NO GROWTH 3 DAYS   Final    Report Status 07/23/2012 FINAL   Final   MRSA PCR SCREENING     Status: Abnormal   Collection Time   07/20/12  5:31 PM      Component Value Range Status Comment   MRSA by PCR POSITIVE (*) NEGATIVE Final   CULTURE, RESPIRATORY     Status: Normal   Collection Time   07/21/12 12:13 PM      Component Value Range Status Comment   Specimen Description BRONCHIAL WASHINGS   Final    Special Requests PATIENT ON FOLLOWING MAXIPIME,VANCOMYCIN   Final    Gram Stain     Final    Value: FEW WBC PRESENT, PREDOMINANTLY PMN     NO SQUAMOUS EPITHELIAL CELLS SEEN     NO ORGANISMS SEEN   Culture FEW STENOTROPHOMONAS MALTOPHILIA   Final    Report Status 07/25/2012 FINAL   Final    Organism ID, Bacteria STENOTROPHOMONAS MALTOPHILIA   Final   AFB CULTURE WITH SMEAR     Status: Normal (Preliminary result)   Collection Time   07/21/12 12:13 PM       Component Value Range Status Comment   Specimen Description BRONCHIAL WASHINGS   Final    Special Requests PATIENT ON FOLLOWING MAXIPIME,VANCOMYCIN   Final    ACID FAST SMEAR NO ACID FAST BACILLI SEEN   Final    Culture     Final    Value: CULTURE WILL BE EXAMINED FOR 6 WEEKS BEFORE ISSUING A FINAL REPORT   Report Status PENDING   Incomplete   FUNGUS CULTURE W SMEAR     Status: Normal (Preliminary result)   Collection Time   07/21/12 12:13 PM      Component Value Range Status Comment   Specimen Description BRONCHIAL WASHINGS   Final    Special Requests PATIENT ON FOLLOWING MAXIPIME,VANCOMYCIN   Final    Fungal Smear NO YEAST OR FUNGAL ELEMENTS SEEN   Final    Culture CULTURE IN PROGRESS FOR FOUR WEEKS   Final    Report Status PENDING   Incomplete   TISSUE CULTURE     Status: Normal   Collection Time   07/21/12  1:01 PM      Component Value Range  Status Comment   Specimen Description TISSUE   Final    Special Requests LEFT PLEURAL PEEL PT ON MAXIPIME VANCOMYCIN   Final    Gram Stain     Final    Value: RARE WBC PRESENT,BOTH PMN AND MONONUCLEAR     NO ORGANISMS SEEN   Culture NO GROWTH 3 DAYS   Final    Report Status 07/24/2012 FINAL   Final   AFB CULTURE WITH SMEAR     Status: Normal (Preliminary result)   Collection Time   07/21/12  1:01 PM      Component Value Range Status Comment   Specimen Description TISSUE   Final    Special Requests LEFT PLEURAL PEEL PT ON MAXIPIME VANCOMYCIN   Final    ACID FAST SMEAR NO ACID FAST BACILLI SEEN   Final    Culture     Final    Value: CULTURE WILL BE EXAMINED FOR 6 WEEKS BEFORE ISSUING A FINAL REPORT   Report Status PENDING   Incomplete   FUNGUS CULTURE W SMEAR     Status: Normal (Preliminary result)   Collection Time   07/21/12  1:01 PM      Component Value Range Status Comment   Specimen Description TISSUE   Final    Special Requests LEFT PLEURAL PEEL PT ON MAXIPIME VANCOMYCIN   Final    Fungal Smear NO YEAST OR FUNGAL ELEMENTS SEEN    Final    Culture CULTURE IN PROGRESS FOR FOUR WEEKS   Final    Report Status PENDING   Incomplete   AFB CULTURE WITH SMEAR     Status: Normal (Preliminary result)   Collection Time   07/21/12  1:01 PM      Component Value Range Status Comment   Specimen Description FLUID PLEURAL LEFT   Final    Special Requests PATIENT ON FOLLOWING MAXIPIME,VANCOMYCIN   Final    ACID FAST SMEAR NO ACID FAST BACILLI SEEN   Final    Culture     Final    Value: CULTURE WILL BE EXAMINED FOR 6 WEEKS BEFORE ISSUING A FINAL REPORT   Report Status PENDING   Incomplete   FUNGUS CULTURE W SMEAR     Status: Normal (Preliminary result)   Collection Time   07/21/12  1:01 PM      Component Value Range Status Comment   Specimen Description FLUID PLEURAL LEFT   Final    Special Requests PATIENT ON FOLLOWING MAXIPIME,VANCOMYCIN   Final    Fungal Smear NO YEAST OR FUNGAL ELEMENTS SEEN   Final    Culture CULTURE IN PROGRESS FOR FOUR WEEKS   Final    Report Status PENDING   Incomplete   BODY FLUID CULTURE     Status: Normal   Collection Time   07/21/12  1:01 PM      Component Value Range Status Comment   Specimen Description FLUID LEFT PLEURAL   Final    Special Requests PT ON MAXIPIME VANCOMYCIN   Final    Gram Stain     Final    Value: RARE WBC PRESENT,BOTH PMN AND MONONUCLEAR     NO ORGANISMS SEEN   Culture NO GROWTH 3 DAYS   Final    Report Status 07/24/2012 FINAL   Final      ANTIBIOTICS: Levaquin12/18 >> 12/20 vanc 12/18 >> Cefepime 12/18 >>         SIGNIFICANT EVENTS:  12/20 VATS decortication. Findings c/w empyema 07/24/12: Adequate sats ,  no increased work of breathing     SUBJECTIVE/OVERNIGHT/INTERVAL HX 07/25/12: No complaints. Feels gasseous in abdomen and a bit tired but improving.   VITAL SIGNS: Temp:  [97.6 F (36.4 C)-99.7 F (37.6 C)] 99.7 F (37.6 C) (12/23 0400) Pulse Rate:  [74-112] 81  (12/23 1000) Resp:  [17-28] 28  (12/23 1000) BP: (90-117)/(48-75) 94/59 mmHg (12/23  1000) SpO2:  [87 %-100 %] 95 % (12/23 1000) Weight:  [90.3 kg (199 lb 1.2 oz)] 90.3 kg (199 lb 1.2 oz) (12/23 0600) HEMODYNAMICS:   VENTILATOR SETTINGS:   INTAKE / OUTPUT: Intake/Output      12/22 0701 - 12/23 0700 12/23 0701 - 12/24 0700   P.O. 1200    I.V. (mL/kg) 1333.6 (14.8)    IV Piggyback 316    Total Intake(mL/kg) 2849.6 (31.6)    Urine (mL/kg/hr) 913 (0.4) 400   Chest Tube 170    Total Output 1083 400   Net +1766.6 -400        Stool Occurrence 2 x      PHYSICAL EXAMINATION: General: NAD Neuro: no focal deficits HEENT:  WNL Cardiovascular: sinus with PACs, no M Lungs: diminished on L, no wheeze Abdomen:  Soft, NT, NABS Musculoskeletal:  Intact Skin:  Warm + le edema (chronic)   LABS: Cbc  Lab 07/25/12 0400 07/24/12 0355 07/23/12 0450  WBC 6.7 -- --  HGB 9.5* 9.4* 10.0*  HCT 28.3* 28.2* 29.5*  PLT 164 139* 123*    Chemistry   Lab 07/25/12 0400 07/24/12 0355 07/23/12 0450 07/22/12 0330  NA 126* 132* 135 --  K 4.0 4.1 4.1 --  CL 95* 99 101 --  CO2 26 25 24  --  BUN 28* 31* 30* --  CREATININE 1.08 1.01 0.90 --  CALCIUM 8.3* 8.3* 8.0* --  MG -- -- -- 1.9  PHOS -- -- -- 2.2*  GLUCOSE 111* 115* 105* --    Liver fxn  Lab 07/23/12 0450 07/20/12 1919  AST 14 16  ALT 13 17  ALKPHOS 86 117  BILITOT 1.2 1.0  PROT 4.8* 5.6*  ALBUMIN 1.8* 1.9*   coags  Lab 07/20/12 1919  APTT 36  INR 1.15   Sepsis markers No results found for this basename: LATICACIDVEN:3,PROCALCITON:3 in the last 168 hours Cardiac markers  Lab 07/19/12 1805  CKTOTAL --  CKMB --  TROPONINI <0.30   BNP  Lab 07/19/12 1805  PROBNP 2817.0*   ABG  Lab 07/22/12 0615 07/22/12 0328 07/21/12 1615  PHART 7.395 7.597* 7.513*  PCO2ART 38.6 25.7* 28.7*  PO2ART 91.0 248.0* 167.0*  HCO3 23.7 25.1* 22.9  TCO2 25 26 23.8    CBG trend  Lab 07/25/12 0558 07/25/12 0010 07/24/12 1751 07/24/12 1248 07/23/12 2324  GLUCAP 95 109* 136* 118* 120*   Dg Chest Port 1  View  07/25/2012  *RADIOLOGY REPORT*  Clinical Data: History of empyema and post VATS.  PORTABLE CHEST - 1 VIEW  Comparison: 07/24/2012  Findings: Stable position of the left chest tubes.  Again noted is marked volume loss in the left hemithorax with persistent interstitial densities.  There are right basilar densities that may represent a combination of pleural fluid and atelectasis. Difficult to exclude mild edema.  The heart and mediastinum are grossly stable.  PICC line tip in the lower SVC region. No definite pneumothorax.  IMPRESSION: Minimal change since the previous examination.  Volume loss and interstitial densities in the left lung.  Persistent right basilar densities as described.   Original Report  Authenticated By: Richarda Overlie, M.D.    Dg Chest Port 1 View  07/24/2012  *RADIOLOGY REPORT*  Clinical Data: Empyema  PORTABLE CHEST - 1 VIEW  Comparison: 07/23/2012  Findings: Cardiomediastinal silhouette is stable.  Stable left chest tubes position.  Improvement in aeration in the left upper lobe with decreasing left pleural effusion.  Stable right pleural effusion with right lower lobe atelectasis or infiltrate. Persistent small left pleural effusion with left lower lobe atelectasis or infiltrate. Stable right arm PICC line position.  No diagnostic pneumothorax. No pulmonary edema.  IMPRESSION: Stable left chest tubes position.  Improvement in aeration in the left upper lobe with decreasing left pleural effusion.  Stable right pleural effusion with right lower lobe atelectasis or infiltrate.  Persistent small left pleural effusion with left lower lobe atelectasis or infiltrate. Stable right arm PICC line position.  No diagnostic pneumothorax.   Original Report Authenticated By: Natasha Mead, M.D.      ECG:  DIAGNOSES: Principal Problem:  *Acute postprocedural respiratory failure Active Problems:  ESSENTIAL HYPERTENSION  COPD (chronic obstructive pulmonary disease)  CAD (coronary artery  disease)  Necrotizing pneumonia, community acquired MRSA  Dysphagia, pharyngeal  Pleural effusion  Atrial fibrillation  Pedal edema  Empyema of left pleural space  Atelectasis of left lung  He is deconditioned but appears to be progressing. His hyponatremia could inhibit his conditioning; will watch it with diuresis.If not improving will consider SAMSCA.   Of note, his stenotrophomonaas apears resistant to cephalosporins ("intermediate". I will call ID  PLAN: Monitor in ICU post extubation Post op mgmt per TCTS Abx as above; will call ID Plan for 4-6 wks IV abx - will call ID Cont chest percussion flutter /IS , ezpap  Cont rest of medical mgmt as is   Dr. Kalman Shan, M.D., [facility].C.P Pulmonary and Critical Care Medicine Staff Physician [facility] Pager: [phone], If no answer or between  15:00h - 7:00h: call [phone]  07/25/2012 10:56 AM

## 2012-07-25 NOTE — Progress Notes (Signed)
INITIAL NUTRITION ASSESSMENT  DOCUMENTATION CODES Per approved criteria  -Not Applicable   INTERVENTION: Continue oral supplements: Ensure Complete po BID, each supplement provides 350 kcal and 13 grams of protein, Boost TID alternating  NUTRITION DIAGNOSIS: Inadequate oral intake related to decreased appetite as evidenced by meal intake 0-10% x 6 days.   Goal: Pt to meet >/= 90% of their estimated nutrition needs; not met  Monitor:  Monitor diet tolerance, po intake meals and oral supplements, wt trends  Reason for Assessment: Assess Nutrition requirments/status  78 y.o. male  Admitting Dx: Acute postprocedural respiratory failure  ASSESSMENT: Pt had an episode of emesis this morning but feels better now. Reports good appetite PTA but po intake has been very poor since hospitalized. He is receiving both Boost and Ensure Complete and the RN reports to be alternating with fair intake. His wt hx show significant wt gain 6.4%,12# since admission.   Inadequate oral intake and he is also at risk for malnutrition given poor po's. Encouraged oral supplement intake to help meet nutrition needs.  Height: Ht Readings from Last 1 Encounters:  07/21/12 6' (1.829 m)    Weight: Wt Readings from Last 1 Encounters:  07/25/12 199 lb 1.2 oz (90.3 kg)    Ideal Body Weight: 178# (80.9 kg)  % Ideal Body Weight: 112%  Wt Readings from Last 10 Encounters:  07/25/12 199 lb 1.2 oz (90.3 kg)  07/25/12 199 lb 1.2 oz (90.3 kg)  07/19/12 186 lb 9.6 oz (84.641 kg)  06/27/12 182 lb (82.555 kg)  05/18/12 183 lb (83.008 kg)  02/11/12 190 lb (86.183 kg)  11/03/11 187 lb (84.823 kg)  10/12/11 186 lb 12.8 oz (84.732 kg)  09/15/11 186 lb (84.369 kg)  09/07/11 184 lb (83.462 kg)    Usual Body Weight: 182# (82.7kg)  % Usual Body Weight: 109%  BMI:  Body mass index is 27.00 kg/(m^2). Overweight  Estimated Nutritional Needs: Kcal: 2075-2490 kcal/day Protein: 99-116 gr/day Fluid: 1  ml/kcal  Skin: no issues noted  Diet Order: Cardiac  EDUCATION NEEDS: -Education needs addressed related to importance of adequate nutrition intake   Intake/Output Summary (Last 24 hours) at 07/25/12 1024 Last data filed at 07/25/12 1000  Gross per 24 hour  Intake 2407.5 ml  Output   1400 ml  Net 1007.5 ml    Last BM: 07/24/12  Labs:   Lab 07/25/12 0400 07/24/12 0355 07/23/12 0450 07/22/12 0330  NA 126* 132* 135 --  K 4.0 4.1 4.1 --  CL 95* 99 101 --  CO2 26 25 24  --  BUN 28* 31* 30* --  CREATININE 1.08 1.01 0.90 --  CALCIUM 8.3* 8.3* 8.0* --  MG -- -- -- 1.9  PHOS -- -- -- 2.2*  GLUCOSE 111* 115* 105* --    CBG (last 3)   Basename 07/25/12 0558 07/25/12 0010 07/24/12 1751  GLUCAP 95 109* 136*    Scheduled Meds:   . amiodarone  200 mg Oral BID  . antiseptic oral rinse  15 mL Mouth Rinse QID  . ceFEPime (MAXIPIME) IV  1 g Intravenous Q8H  . chlorhexidine  15 mL Mouth Rinse BID  . enoxaparin  40 mg Subcutaneous Q24H  . feeding supplement  237 mL Oral TID BM  . furosemide  40 mg Intravenous Daily  . insulin aspart  0-24 Units Subcutaneous Q6H  . ipratropium  0.5 mg Nebulization Q6H  . lactose free nutrition  237 mL Oral TID WC  . levalbuterol  0.63 mg  Nebulization Q6H  . pantoprazole (PROTONIX) IV  40 mg Intravenous Daily  . sodium chloride  10-40 mL Intracatheter Q12H  . vancomycin  1,000 mg Intravenous Q12H    Continuous Infusions:   Past Medical History  Diagnosis Date  . Allergic rhinitis   . COPD (chronic obstructive pulmonary disease)   . Disorders of diaphragm   . Acute bronchitis   . Hypertension   . CAD (coronary artery disease)     Catheterization 2009, mild coronary disease (after abnormal nuclear study,With hypotensive response to exercise, 2009)  . RBBB   . Atrial flutter     Ablated in the past, no recurrence  . Venous insufficiency   . Edema   . Dyslipidemia     Patient has an and to use statin  . GERD (gastroesophageal reflux  disease)   . Diverticulosis of colon   . Hypertrophy of prostate with urinary obstruction and other lower urinary tract symptoms (LUTS)   . Increased prostate specific antigen (PSA) velocity   . DJD (degenerative joint disease)   . Chronic insomnia   . Malignant melanoma     Removed from mediastinum via thoracotomy July, 2010  . Ejection fraction     EF 60%,Echo, 2009,  /  EF 55-60%, echo, February, 2013  . Aortic stenosis     Mild, echo and catheterization, 2009 /  Mild, echo, February, 2013  . Mitral stenosis     Very mild, echo, from mitral annular calcification  . Shortness of breath     Episodes of feeling shortness of breath with some mild dizziness with mild exertion., February, 2013  . Tricuspid regurgitation     Moderate, echo, February, 2013, 56 mmHg  . Pulmonary hypertension     56 mmHg, echo, February, 2013  . Pneumonia     Past Surgical History  Procedure Date  . Bilateral inguinal hernia repair   . Umbilical hernia repair   . Median sternotomy for mediastinal melanoma 1980s    [phone]

## 2012-07-26 ENCOUNTER — Inpatient Hospital Stay (HOSPITAL_COMMUNITY): Payer: Medicare Other

## 2012-07-26 DIAGNOSIS — J852 Abscess of lung without pneumonia: Secondary | ICD-10-CM

## 2012-07-26 LAB — GLUCOSE, CAPILLARY
Glucose-Capillary: 100 mg/dL — ABNORMAL HIGH (ref 70–99)
Glucose-Capillary: 91 mg/dL (ref 70–99)
Glucose-Capillary: 96 mg/dL (ref 70–99)

## 2012-07-26 LAB — BASIC METABOLIC PANEL
BUN: 31 mg/dL — ABNORMAL HIGH (ref 6–23)
Calcium: 8.6 mg/dL (ref 8.4–10.5)
Chloride: 98 mEq/L (ref 96–112)
Creatinine, Ser: 1.13 mg/dL (ref 0.50–1.35)
GFR calc Af Amer: 70 mL/min — ABNORMAL LOW (ref >90)

## 2012-07-26 LAB — CBC
HCT: 29.8 % — ABNORMAL LOW (ref 39.0–52.0)
MCHC: 33.6 g/dL (ref 30.0–36.0)
MCV: 87.4 fL (ref 78.0–100.0)
Platelets: 213 10*3/uL (ref 150–400)
RDW: 13.4 % (ref 11.5–15.5)
WBC: 8.3 10*3/uL (ref 4.0–10.5)

## 2012-07-26 LAB — MAGNESIUM: Magnesium: 1.9 mg/dL (ref 1.5–2.5)

## 2012-07-26 LAB — PHOSPHORUS: Phosphorus: 2.6 mg/dL (ref 2.3–4.6)

## 2012-07-26 MED ORDER — TAMSULOSIN HCL 0.4 MG PO CAPS
0.4000 mg | ORAL_CAPSULE | Freq: Two times a day (BID) | ORAL | Status: DC
  Administered 2012-07-26 – 2012-08-04 (×18): 0.4 mg via ORAL
  Filled 2012-07-26 (×21): qty 1

## 2012-07-26 MED ORDER — SODIUM CHLORIDE 0.9 % IV SOLN
INTRAVENOUS | Status: DC
  Administered 2012-07-26: 20 mL/h via INTRAVENOUS
  Administered 2012-07-27: 500 mL via INTRAVENOUS
  Administered 2012-07-28: 20 mL/h via INTRAVENOUS
  Administered 2012-07-31 – 2012-08-02 (×3): via INTRAVENOUS

## 2012-07-26 NOTE — Progress Notes (Signed)
[facility]   Day # 2 of tygacil  Subjective: No new complaints   Antibiotics:  Anti-infectives     Start     Dose/Rate Route Frequency Ordered Stop   07/26/12 0000   tigecycline (TYGACIL) 50 mg in sodium chloride 0.9 % 100 mL IVPB        50 mg 200 mL/hr over 30 Minutes Intravenous Every 12 hours 07/25/12 1134     07/25/12 1200   tigecycline (TYGACIL) 100 mg in sodium chloride 0.9 % 100 mL IVPB        100 mg 200 mL/hr over 30 Minutes Intravenous  Once 07/25/12 1134 07/25/12 1244   07/22/12 1000   ceFEPIme (MAXIPIME) 1 g in dextrose 5 % 50 mL IVPB  Status:  Discontinued        1 g 100 mL/hr over 30 Minutes Intravenous Every 8 hours 07/22/12 0937 07/25/12 1133   07/20/12 2200   levofloxacin (LEVAQUIN) IVPB 750 mg        750 mg 100 mL/hr over 90 Minutes Intravenous Every 24 hours 07/20/12 0759 07/22/12 0103   07/20/12 1800   vancomycin (VANCOCIN) IVPB 1000 mg/200 mL premix  Status:  Discontinued        1,000 mg 200 mL/hr over 60 Minutes Intravenous Every 12 hours 07/20/12 0756 07/25/12 1133   07/20/12 0900   ceFEPIme (MAXIPIME) 1 g in dextrose 5 % 50 mL IVPB  Status:  Discontinued        1 g 100 mL/hr over 30 Minutes Intravenous Every 12 hours 07/20/12 0758 07/22/12 0937   07/19/12 1800   ceFEPIme (MAXIPIME) 1 g in dextrose 5 % 50 mL IVPB  Status:  Discontinued        1 g 100 mL/hr over 30 Minutes Intravenous 3 times per day 07/19/12 1715 07/19/12 1736   07/19/12 1800   ceFEPIme (MAXIPIME) 1 g in dextrose 5 % 50 mL IVPB  Status:  Discontinued        1 g 100 mL/hr over 30 Minutes Intravenous Every 24 hours 07/19/12 1736 07/20/12 0758   07/19/12 1800   levofloxacin (LEVAQUIN) IVPB 750 mg  Status:  Discontinued        750 mg 100 mL/hr over 90 Minutes Intravenous Every 48 hours 07/19/12 1738 07/20/12 0759   07/19/12 1800   vancomycin (VANCOCIN) 750 mg in sodium chloride 0.9 % 150 mL IVPB  Status:  Discontinued        750 mg 150 mL/hr over 60  Minutes Intravenous Every 12 hours 07/19/12 1738 07/20/12 0756   07/19/12 1730   levofloxacin (LEVAQUIN) IVPB 750 mg  Status:  Discontinued        750 mg 100 mL/hr over 90 Minutes Intravenous Every 24 hours 07/19/12 1715 07/19/12 1737          Medications: Scheduled Meds:   . amiodarone  200 mg Oral BID  . antiseptic oral rinse  15 mL Mouth Rinse QID  . chlorhexidine  15 mL Mouth Rinse BID  . enoxaparin  40 mg Subcutaneous Q24H  . feeding supplement  237 mL Oral TID BM  . furosemide  40 mg Intravenous Daily  . insulin aspart  0-24 Units Subcutaneous Q6H  . ipratropium  0.5 mg Nebulization Q6H  . lactose free nutrition  237 mL Oral TID WC  . levalbuterol  0.63 mg Nebulization Q6H  . pantoprazole (PROTONIX) IV  40 mg Intravenous Daily  . sodium chloride  10-40  mL Intracatheter Q12H  . tigecycline (TYGACIL) IVPB  50 mg Intravenous Q12H   Continuous Infusions:  PRN Meds:.alum & mag hydroxide-simeth, dextromethorphan-guaiFENesin, fentaNYL, fentaNYL, food thickener, levalbuterol, ondansetron (ZOFRAN) IV, polyethylene glycol, potassium chloride, senna-docusate, sodium chloride, traMADol   Objective: Weight change: -1 lb 5.2 oz (-0.6 kg)  Intake/Output Summary (Last 24 hours) at 07/26/12 1130 Last data filed at 07/26/12 1000  Gross per 24 hour  Intake    308 ml  Output   1330 ml  Net  -1022 ml   Blood pressure 100/56, pulse 109, temperature 98.4 F (36.9 C), temperature source Axillary, resp. rate 21, height 6' (1.829 m), weight 197 lb 12 oz (89.7 kg), SpO2 98.00%. Temp:  [97.5 F (36.4 C)-98.7 F (37.1 C)] 98.4 F (36.9 C) (12/24 0757) Pulse Rate:  [39-109] 109  (12/24 1000) Resp:  [13-33] 21  (12/24 1000) BP: (93-119)/(51-71) 100/56 mmHg (12/24 0900) SpO2:  [92 %-100 %] 98 % (12/24 1000) Weight:  [197 lb 12 oz (89.7 kg)] 197 lb 12 oz (89.7 kg) (12/24 0600)  Physical Exam: General: Alert and awake, oriented not in any acute distress.  HEENT: anicteric sclera, pupils  reactive to light and accommodation, EOMI, oropharynx clear and without exudate  CVS regular rate, normal r, no murmur rubs or gallops  Chest:rhonchi throughout, chest tube in place with sanguinous material  Abdomen: soft nontender, nondistended, normal bowel sounds,  Extremities: no clubbing or edema noted bilaterally  Skin: no rashes  Neuro: nonfocal, strength and sensation intact   Lab Results:  Basename 07/26/12 0435 07/25/12 0400  WBC 8.3 6.7  HGB 10.0* 9.5*  HCT 29.8* 28.3*  PLT 213 164    BMET  Basename 07/26/12 0435 07/25/12 0400  NA 131* 126*  K 3.8 4.0  CL 98 95*  CO2 27 26  GLUCOSE 115* 111*  BUN 31* 28*  CREATININE 1.13 1.08  CALCIUM 8.6 8.3*    Micro Results: Recent Results (from the past 240 hour(s))  BODY FLUID CULTURE     Status: Normal   Collection Time   07/20/12 11:24 AM      Component Value Range Status Comment   Specimen Description FLUID PLEURAL   Final    Special Requests NONE   Final    Gram Stain     Final    Value: NO WBC SEEN     NO ORGANISMS SEEN   Culture NO GROWTH 3 DAYS   Final    Report Status 07/23/2012 FINAL   Final   MRSA PCR SCREENING     Status: Abnormal   Collection Time   07/20/12  5:31 PM      Component Value Range Status Comment   MRSA by PCR POSITIVE (*) NEGATIVE Final   CULTURE, RESPIRATORY     Status: Normal   Collection Time   07/21/12 12:13 PM      Component Value Range Status Comment   Specimen Description BRONCHIAL WASHINGS   Final    Special Requests PATIENT ON FOLLOWING MAXIPIME,VANCOMYCIN   Final    Gram Stain     Final    Value: FEW WBC PRESENT, PREDOMINANTLY PMN     NO SQUAMOUS EPITHELIAL CELLS SEEN     NO ORGANISMS SEEN   Culture FEW STENOTROPHOMONAS MALTOPHILIA   Final    Report Status 07/26/2012 FINAL   Final    Organism ID, Bacteria STENOTROPHOMONAS MALTOPHILIA   Final   AFB CULTURE WITH SMEAR     Status: Normal (Preliminary result)  Collection Time   07/21/12 12:13 PM      Component Value Range  Status Comment   Specimen Description BRONCHIAL WASHINGS   Final    Special Requests PATIENT ON FOLLOWING MAXIPIME,VANCOMYCIN   Final    ACID FAST SMEAR NO ACID FAST BACILLI SEEN   Final    Culture     Final    Value: CULTURE WILL BE EXAMINED FOR 6 WEEKS BEFORE ISSUING A FINAL REPORT   Report Status PENDING   Incomplete   FUNGUS CULTURE W SMEAR     Status: Normal (Preliminary result)   Collection Time   07/21/12 12:13 PM      Component Value Range Status Comment   Specimen Description BRONCHIAL WASHINGS   Final    Special Requests PATIENT ON FOLLOWING MAXIPIME,VANCOMYCIN   Final    Fungal Smear NO YEAST OR FUNGAL ELEMENTS SEEN   Final    Culture CULTURE IN PROGRESS FOR FOUR WEEKS   Final    Report Status PENDING   Incomplete   TISSUE CULTURE     Status: Normal   Collection Time   07/21/12  1:01 PM      Component Value Range Status Comment   Specimen Description TISSUE   Final    Special Requests LEFT PLEURAL PEEL PT ON MAXIPIME VANCOMYCIN   Final    Gram Stain     Final    Value: RARE WBC PRESENT,BOTH PMN AND MONONUCLEAR     NO ORGANISMS SEEN   Culture NO GROWTH 3 DAYS   Final    Report Status 07/24/2012 FINAL   Final   AFB CULTURE WITH SMEAR     Status: Normal (Preliminary result)   Collection Time   07/21/12  1:01 PM      Component Value Range Status Comment   Specimen Description TISSUE   Final    Special Requests LEFT PLEURAL PEEL PT ON MAXIPIME VANCOMYCIN   Final    ACID FAST SMEAR NO ACID FAST BACILLI SEEN   Final    Culture     Final    Value: CULTURE WILL BE EXAMINED FOR 6 WEEKS BEFORE ISSUING A FINAL REPORT   Report Status PENDING   Incomplete   FUNGUS CULTURE W SMEAR     Status: Normal (Preliminary result)   Collection Time   07/21/12  1:01 PM      Component Value Range Status Comment   Specimen Description TISSUE   Final    Special Requests LEFT PLEURAL PEEL PT ON MAXIPIME VANCOMYCIN   Final    Fungal Smear NO YEAST OR FUNGAL ELEMENTS SEEN   Final    Culture  CULTURE IN PROGRESS FOR FOUR WEEKS   Final    Report Status PENDING   Incomplete   AFB CULTURE WITH SMEAR     Status: Normal (Preliminary result)   Collection Time   07/21/12  1:01 PM      Component Value Range Status Comment   Specimen Description FLUID PLEURAL LEFT   Final    Special Requests PATIENT ON FOLLOWING MAXIPIME,VANCOMYCIN   Final    ACID FAST SMEAR NO ACID FAST BACILLI SEEN   Final    Culture     Final    Value: CULTURE WILL BE EXAMINED FOR 6 WEEKS BEFORE ISSUING A FINAL REPORT   Report Status PENDING   Incomplete   FUNGUS CULTURE W SMEAR     Status: Normal (Preliminary result)   Collection Time   07/21/12  1:01  PM      Component Value Range Status Comment   Specimen Description FLUID PLEURAL LEFT   Final    Special Requests PATIENT ON FOLLOWING MAXIPIME,VANCOMYCIN   Final    Fungal Smear NO YEAST OR FUNGAL ELEMENTS SEEN   Final    Culture CULTURE IN PROGRESS FOR FOUR WEEKS   Final    Report Status PENDING   Incomplete   BODY FLUID CULTURE     Status: Normal   Collection Time   07/21/12  1:01 PM      Component Value Range Status Comment   Specimen Description FLUID LEFT PLEURAL   Final    Special Requests PT ON MAXIPIME VANCOMYCIN   Final    Gram Stain     Final    Value: RARE WBC PRESENT,BOTH PMN AND MONONUCLEAR     NO ORGANISMS SEEN   Culture NO GROWTH 3 DAYS   Final    Report Status 07/24/2012 FINAL   Final     Studies/Results: Dg Chest Port 1 View  07/26/2012  *RADIOLOGY REPORT*  Clinical Data: Left pleural effusion  PORTABLE CHEST - 1 VIEW  Comparison: Yesterday  Findings: Right PICC and left chest tube stable.  No pneumothorax. Bilateral pleural effusions left greater than right stable. Bibasilar atelectasis verses airspace disease left greater than right stable.  Interstitial edema and vascular congestion improved.  IMPRESSION: Improved edema.   Original Report Authenticated By: Jolaine Click, M.D.    Dg Chest Port 1 View  07/25/2012  *RADIOLOGY REPORT*   Clinical Data: History of empyema and post VATS.  PORTABLE CHEST - 1 VIEW  Comparison: 07/24/2012  Findings: Stable position of the left chest tubes.  Again noted is marked volume loss in the left hemithorax with persistent interstitial densities.  There are right basilar densities that may represent a combination of pleural fluid and atelectasis. Difficult to exclude mild edema.  The heart and mediastinum are grossly stable.  PICC line tip in the lower SVC region. No definite pneumothorax.  IMPRESSION: Minimal change since the previous examination.  Volume loss and interstitial densities in the left lung.  Persistent right basilar densities as described.   Original Report Authenticated By: Richarda Overlie, M.D.       Assessment/Plan: Christian [name] is a 78 y.o. male with multiple medical problems including COPD with recent admission for necrotizing MRSA pneumonia and empyema readmitted to hospital and now status post VAT with decortication of the left side did empyema and also now with S. maltophilia having been cultured on bronchoscopy with question of whether or not this is causing a healthcare associated PNA on top of his prior severe MRSA infection   #1 S. Maltophila : This could be a colonizer and was present in only a few colonies on culture. However I assume this came from the large amount of mucous that was occluding bronchus given that fact, recent fevers and CXR changes we went ahead and rx as being an HCAP with Tygacil --lab will ask lab to do Tygacil MIC --if worsens would get more endobroncchial cultures and consider repeat Bronchoscopy  --would give a  14 day course of therapy with tygacil   #2 Necrotizing PNA: due to MRSA. TYgacil will cover this as well (was tetracycline Sensitive):  Once tygacil course if finished would change to po doxcycline 100mg  bid orally and continue on this for another month with followup with CVTS and with [place] in [facility]  I will ask my clinic manager  to  arrange HSFU in 3 weeks time  I will sign off for now as he seems to be improving. Dr. Ninetta Lights is covering for questions for the next 4 days.      LOS: 7 days   Acey Lav 07/26/2012, 11:30 AM

## 2012-07-26 NOTE — Progress Notes (Signed)
PULMONARY  / CRITICAL CARE MEDICINE  Name: Christian [name] MRN: [id number] DOB: Aug 12, 1931    LOS: 7  REFERRING MD :  Christian [name] PCP: Christian [name] CHIEF COMPLAINT:  Loculated left pleural effusion/empyema    BRIEF PATIENT DESCRIPTION:  78 yo male, PCP of Christian [name], seen in office 12-17 with increased effusion. He was discharged from [facility] 11/25 to 12-10 to [facility] after treatment for MRSA necrotizing pna.  VATS/decortication 12/19 > empyema  LINES / TUBES: R IJ CVL 12/19 >> 12/20  ETT 12/19 >> 12/20 PICC 12/20 >>   CULTURES: 12/18 MRSA PCR >> POS 12/19 pleural fluid >> NEG 12/19 Bronch washings - STENOTROPHOMONAS (resistant to bactrim, intermediate to Ceftaz and Levoflox)  Results for orders placed during the hospital encounter of 07/19/12  BODY FLUID CULTURE     Status: Normal   Collection Time   07/20/12 11:24 AM      Component Value Range Status Comment   Specimen Description FLUID PLEURAL   Final    Special Requests NONE   Final    Gram Stain     Final    Value: NO WBC SEEN     NO ORGANISMS SEEN   Culture NO GROWTH 3 DAYS   Final    Report Status 07/23/2012 FINAL   Final   MRSA PCR SCREENING     Status: Abnormal   Collection Time   07/20/12  5:31 PM      Component Value Range Status Comment   MRSA by PCR POSITIVE (*) NEGATIVE Final   CULTURE, RESPIRATORY     Status: Normal   Collection Time   07/21/12 12:13 PM      Component Value Range Status Comment   Specimen Description BRONCHIAL WASHINGS   Final    Special Requests PATIENT ON FOLLOWING MAXIPIME,VANCOMYCIN   Final    Gram Stain     Final    Value: FEW WBC PRESENT, PREDOMINANTLY PMN     NO SQUAMOUS EPITHELIAL CELLS SEEN     NO ORGANISMS SEEN   Culture FEW STENOTROPHOMONAS MALTOPHILIA   Final    Report Status 07/26/2012 FINAL   Final    Organism ID, Bacteria STENOTROPHOMONAS MALTOPHILIA   Final   AFB CULTURE WITH SMEAR     Status: Normal (Preliminary result)   Collection Time   07/21/12 12:13 PM   Component Value Range Status Comment   Specimen Description BRONCHIAL WASHINGS   Final    Special Requests PATIENT ON FOLLOWING MAXIPIME,VANCOMYCIN   Final    ACID FAST SMEAR NO ACID FAST BACILLI SEEN   Final    Culture     Final    Value: CULTURE WILL BE EXAMINED FOR 6 WEEKS BEFORE ISSUING A FINAL REPORT   Report Status PENDING   Incomplete   FUNGUS CULTURE W SMEAR     Status: Normal (Preliminary result)   Collection Time   07/21/12 12:13 PM      Component Value Range Status Comment   Specimen Description BRONCHIAL WASHINGS   Final    Special Requests PATIENT ON FOLLOWING MAXIPIME,VANCOMYCIN   Final    Fungal Smear NO YEAST OR FUNGAL ELEMENTS SEEN   Final    Culture CULTURE IN PROGRESS FOR FOUR WEEKS   Final    Report Status PENDING   Incomplete   TISSUE CULTURE     Status: Normal   Collection Time   07/21/12  1:01 PM      Component Value Range Status Comment  Specimen Description TISSUE   Final    Special Requests LEFT PLEURAL PEEL PT ON MAXIPIME VANCOMYCIN   Final    Gram Stain     Final    Value: RARE WBC PRESENT,BOTH PMN AND MONONUCLEAR     NO ORGANISMS SEEN   Culture NO GROWTH 3 DAYS   Final    Report Status 07/24/2012 FINAL   Final   AFB CULTURE WITH SMEAR     Status: Normal (Preliminary result)   Collection Time   07/21/12  1:01 PM      Component Value Range Status Comment   Specimen Description TISSUE   Final    Special Requests LEFT PLEURAL PEEL PT ON MAXIPIME VANCOMYCIN   Final    ACID FAST SMEAR NO ACID FAST BACILLI SEEN   Final    Culture     Final    Value: CULTURE WILL BE EXAMINED FOR 6 WEEKS BEFORE ISSUING A FINAL REPORT   Report Status PENDING   Incomplete   FUNGUS CULTURE W SMEAR     Status: Normal (Preliminary result)   Collection Time   07/21/12  1:01 PM      Component Value Range Status Comment   Specimen Description TISSUE   Final    Special Requests LEFT PLEURAL PEEL PT ON MAXIPIME VANCOMYCIN   Final    Fungal Smear NO YEAST OR FUNGAL ELEMENTS SEEN    Final    Culture CULTURE IN PROGRESS FOR FOUR WEEKS   Final    Report Status PENDING   Incomplete   AFB CULTURE WITH SMEAR     Status: Normal (Preliminary result)   Collection Time   07/21/12  1:01 PM      Component Value Range Status Comment   Specimen Description FLUID PLEURAL LEFT   Final    Special Requests PATIENT ON FOLLOWING MAXIPIME,VANCOMYCIN   Final    ACID FAST SMEAR NO ACID FAST BACILLI SEEN   Final    Culture     Final    Value: CULTURE WILL BE EXAMINED FOR 6 WEEKS BEFORE ISSUING A FINAL REPORT   Report Status PENDING   Incomplete   FUNGUS CULTURE W SMEAR     Status: Normal (Preliminary result)   Collection Time   07/21/12  1:01 PM      Component Value Range Status Comment   Specimen Description FLUID PLEURAL LEFT   Final    Special Requests PATIENT ON FOLLOWING MAXIPIME,VANCOMYCIN   Final    Fungal Smear NO YEAST OR FUNGAL ELEMENTS SEEN   Final    Culture CULTURE IN PROGRESS FOR FOUR WEEKS   Final    Report Status PENDING   Incomplete   BODY FLUID CULTURE     Status: Normal   Collection Time   07/21/12  1:01 PM      Component Value Range Status Comment   Specimen Description FLUID LEFT PLEURAL   Final    Special Requests PT ON MAXIPIME VANCOMYCIN   Final    Gram Stain     Final    Value: RARE WBC PRESENT,BOTH PMN AND MONONUCLEAR     NO ORGANISMS SEEN   Culture NO GROWTH 3 DAYS   Final    Report Status 07/24/2012 FINAL   Final      ANTIBIOTICS: Levaquin12/18 >> 12/20 vanc 12/18 >>12/23 Cefepime 12/18 >>12/23 TYGACIL 12/23 (ID condult) >>    SIGNIFICANT EVENTS:  12/20 VATS decortication. Findings c/w empyema 07/24/12: Adequate sats , no increased work  of breathing  07/25/12: No complaints. Feels gasseous in abdomen and a bit tired but improving.    SUBJECTIVE/OVERNIGHT/INTERVAL HX  - feeling better. Lot of phlegm. RN says he is not movtiated to do PT   VITAL SIGNS: Temp:  [97.5 F (36.4 C)-98.7 F (37.1 C)] 98.4 F (36.9 C) (12/24 0757) Pulse  Rate:  [39-109] 109  (12/24 1000) Resp:  [13-33] 21  (12/24 1000) BP: (93-119)/(50-71) 100/56 mmHg (12/24 0900) SpO2:  [92 %-100 %] 98 % (12/24 1000) Weight:  [89.7 kg (197 lb 12 oz)] 89.7 kg (197 lb 12 oz) (12/24 0600) HEMODYNAMICS:   VENTILATOR SETTINGS:   INTAKE / OUTPUT: Intake/Output      12/23 0701 - 12/24 0700 12/24 0701 - 12/25 0700   P.O.     I.V. (mL/kg) 240 (2.7) 60 (0.7)   IV Piggyback  8   Total Intake(mL/kg) 240 (2.7) 68 (0.8)   Urine (mL/kg/hr) 1570 (0.7) 230   Chest Tube 280    Total Output 1850 230   Net -1610 -162        Urine Occurrence 3 x      PHYSICAL EXAMINATION: General: Deconditioned. Sitting in chair. Looking better Neuro: no focal deficits HEENT:  WNL Cardiovascular: sinus with PACs, no M Lungs: diminished on L, no wheeze Abdomen:  Soft, NT, NABS Musculoskeletal:  Intact Skin:  Warm + le edema (chronic)   LABS: Cbc  Lab 07/26/12 0435 07/25/12 0400 07/24/12 0355  WBC 8.3 -- --  HGB 10.0* 9.5* 9.4*  HCT 29.8* 28.3* 28.2*  PLT 213 164 139*    Chemistry   Lab 07/26/12 0435 07/25/12 0400 07/24/12 0355 07/22/12 0330  NA 131* 126* 132* --  K 3.8 4.0 4.1 --  CL 98 95* 99 --  CO2 27 26 25  --  BUN 31* 28* 31* --  CREATININE 1.13 1.08 1.01 --  CALCIUM 8.6 8.3* 8.3* --  MG 1.9 -- -- 1.9  PHOS 2.6 -- -- 2.2*  GLUCOSE 115* 111* 115* --    Liver fxn  Lab 07/23/12 0450 07/20/12 1919  AST 14 16  ALT 13 17  ALKPHOS 86 117  BILITOT 1.2 1.0  PROT 4.8* 5.6*  ALBUMIN 1.8* 1.9*   coags  Lab 07/20/12 1919  APTT 36  INR 1.15   Sepsis markers No results found for this basename: LATICACIDVEN:3,PROCALCITON:3 in the last 168 hours Cardiac markers  Lab 07/19/12 1805  CKTOTAL --  CKMB --  TROPONINI <0.30   BNP  Lab 07/19/12 1805  PROBNP 2817.0*   ABG  Lab 07/22/12 0615 07/22/12 0328 07/21/12 1615  PHART 7.395 7.597* 7.513*  PCO2ART 38.6 25.7* 28.7*  PO2ART 91.0 248.0* 167.0*  HCO3 23.7 25.1* 22.9  TCO2 25 26 23.8    CBG  trend  Lab 07/25/12 2359 07/25/12 1611 07/25/12 1135 07/25/12 0558 07/25/12 0010  GLUCAP 96 112* 122* 95 109*   Dg Chest Port 1 View  07/26/2012  *RADIOLOGY REPORT*  Clinical Data: Left pleural effusion  PORTABLE CHEST - 1 VIEW  Comparison: Yesterday  Findings: Right PICC and left chest tube stable.  No pneumothorax. Bilateral pleural effusions left greater than right stable. Bibasilar atelectasis verses airspace disease left greater than right stable.  Interstitial edema and vascular congestion improved.  IMPRESSION: Improved edema.   Original Report Authenticated By: Jolaine Click, M.D.    Dg Chest Port 1 View  07/25/2012  *RADIOLOGY REPORT*  Clinical Data: History of empyema and post VATS.  PORTABLE CHEST - 1  VIEW  Comparison: 07/24/2012  Findings: Stable position of the left chest tubes.  Again noted is marked volume loss in the left hemithorax with persistent interstitial densities.  There are right basilar densities that may represent a combination of pleural fluid and atelectasis. Difficult to exclude mild edema.  The heart and mediastinum are grossly stable.  PICC line tip in the lower SVC region. No definite pneumothorax.  IMPRESSION: Minimal change since the previous examination.  Volume loss and interstitial densities in the left lung.  Persistent right basilar densities as described.   Original Report Authenticated By: Richarda Overlie, M.D.      ECG:  DIAGNOSES: Principal Problem:  *Acute postprocedural respiratory failure Active Problems:  ESSENTIAL HYPERTENSION  COPD (chronic obstructive pulmonary disease)  CAD (coronary artery disease)  Necrotizing pneumonia, community acquired MRSA  Dysphagia, pharyngeal  Pleural effusion  Atrial fibrillation  Pedal edema  Empyema of left pleural space  Atelectasis of left lung    PLAN: Monitor in ICU post extubation Post op mgmt per TCTS Abx per ID Cont chest percussion flutter /IS , ezpap  Cont rest of medical mgmt as is PCCM will  visit again 07/28/12     Dr. Kalman Shan, M.D., F.C.C.P Pulmonary and Critical Care Medicine Staff Physician [facility] Pager: [phone], If no answer or between  15:00h - 7:00h: call [phone]  07/26/2012 10:57 AM

## 2012-07-26 NOTE — Progress Notes (Addendum)
5 Days Post-Op Procedure(s) (LRB): VIDEO ASSISTED THORACOSCOPY (VATS)/EMPYEMA (Left) VIDEO BRONCHOSCOPY (Bilateral) Subjective:  Mr. Calzadilla is doing better this morning.  He continues to have large amounts of sputum production.  His foley catheter is leaking.    Objective: Vital signs in last 24 hours: Temp:  [97.5 F (36.4 C)-98.7 F (37.1 C)] 98.4 F (36.9 C) (12/24 0757) Pulse Rate:  [44-108] 104  (12/24 0700) Cardiac Rhythm:  [-] Atrial fibrillation (12/24 0745) Resp:  [13-33] 24  (12/24 0700) BP: (93-119)/(50-71) 115/56 mmHg (12/24 0700) SpO2:  [95 %-100 %] 99 % (12/24 0753) Weight:  [197 lb 12 oz (89.7 kg)] 197 lb 12 oz (89.7 kg) (12/24 0600)  Intake/Output from previous day: 12/23 0701 - 12/24 0700 In: 240 [I.V.:240] Out: 1850 [Urine:1570; Chest Tube:280]  General appearance: alert, cooperative and no distress Heart: regular rate and rhythm Lungs: diminished breath sounds bibasilar Abdomen: soft, non-tender; bowel sounds normal; no masses,  no organomegaly Extremities: edema 1-2+ Wound: clean and dry  Lab Results:  Basename 07/26/12 0435 07/25/12 0400  WBC 8.3 6.7  HGB 10.0* 9.5*  HCT 29.8* 28.3*  PLT 213 164   BMET:  Basename 07/26/12 0435 07/25/12 0400  NA 131* 126*  K 3.8 4.0  CL 98 95*  CO2 27 26  GLUCOSE 115* 111*  BUN 31* 28*  CREATININE 1.13 1.08  CALCIUM 8.6 8.3*    PT/INR: No results found for this basename: LABPROT,INR in the last 72 hours ABG    Component Value Date/Time   PHART 7.395 07/22/2012 0615   HCO3 23.7 07/22/2012 0615   TCO2 25 07/22/2012 0615   ACIDBASEDEF 1.0 07/22/2012 0615   O2SAT 97.0 07/22/2012 0615   CBG (last 3)   Basename 07/25/12 2359 07/25/12 1611 07/25/12 1135  GLUCAP 96 112* 122*    Assessment/Plan: S/P Procedure(s) (LRB): VIDEO ASSISTED THORACOSCOPY (VATS)/EMPYEMA (Left) VIDEO BRONCHOSCOPY (Bilateral)  1. Chest tubes in place- no air leak present, 280cc output yesterday, will leave in place, put to water  seal 2. Pulm- CXR stable, continue aggressive pulm toilet 3. D/C Foley catheter- is leaking, will try bedside urinarl 4. Empyema- on IV Vanc 5. Volume Overload- patients weight is still up 11lbs, + LE edema, continue IV diuresis 6. Dispo- patient is stable, will put chest tubes to water seal, transfer to step down   LOS: 7 days    BARRETT, ERIN 07/26/2012   I have seen and examined the patient and agree with the assessment and plan as outlined.  OWEN,CLARENCE H 07/26/2012 10:45 AM

## 2012-07-27 ENCOUNTER — Inpatient Hospital Stay (HOSPITAL_COMMUNITY): Payer: Medicare Other

## 2012-07-27 LAB — CULTURE, RESPIRATORY W GRAM STAIN

## 2012-07-27 LAB — GLUCOSE, CAPILLARY
Glucose-Capillary: 93 mg/dL (ref 70–99)
Glucose-Capillary: 87 mg/dL (ref 70–99)

## 2012-07-27 MED ORDER — FUROSEMIDE 10 MG/ML IJ SOLN
40.0000 mg | Freq: Two times a day (BID) | INTRAMUSCULAR | Status: DC
  Administered 2012-07-27 – 2012-07-29 (×4): 40 mg via INTRAVENOUS
  Filled 2012-07-27 (×5): qty 4

## 2012-07-27 MED ORDER — ENSURE PUDDING PO PUDG
1.0000 | Freq: Three times a day (TID) | ORAL | Status: DC
  Administered 2012-07-27 – 2012-08-09 (×11): 1 via ORAL

## 2012-07-27 NOTE — Progress Notes (Signed)
Patient has only been able to void 50cc since removal of catheter at 1600 yesterday afternoon at 1600. Bladder scan showed 300cc, however Christian [name] was feeling uncomfortable pressure with the inability to urinate. 14 Fr catheter replaced with the return of 350cc clear yellow urine. Christian [name] stated that he felt much better.

## 2012-07-27 NOTE — Progress Notes (Signed)
PULMONARY  / CRITICAL CARE MEDICINE  Name: Christian [name] MRN: [id number] DOB: 1931-11-18    LOS: 8  REFERRING MD :  [facility] PCP: Kriste Basque CHIEF COMPLAINT:  Loculated left pleural effusion/empyema    BRIEF PATIENT DESCRIPTION:  78 yo male, PCP of Christian [name], seen in office 12-17 with increased effusion. He was discharged from [facility] 11/25 to 12-10 to [facility] after treatment for MRSA necrotizing pna.  VATS/decortication 12/19 > empyema  LINES / TUBES: R IJ CVL 12/19 >> 12/20  ETT 12/19 >> 12/20 PICC 12/20 >>   CULTURES: 12/18 MRSA PCR >> POS 12/19 pleural fluid >> NEG 12/19 Bronch washings - STENOTROPHOMONAS (resistant to bactrim, intermediate to Ceftaz and Levoflox)   ANTIBIOTICS: Levaquin12/18 >> 12/20 vanc 12/18 >>12/23 Cefepime 12/18 >>12/23 TYGACIL 12/23 (ID consult) >>    SIGNIFICANT EVENTS:  12/20 VATS decortication. Findings c/w empyema 07/24/12: Adequate sats , no increased work of breathing  07/25/12: No complaints. Feels gasseous in abdomen and a bit tired but improving.    SUBJECTIVE/OVERNIGHT/INTERVAL HX  - Afebrile -foley had to be reinserted Denies pain, cough better   VITAL SIGNS: Temp:  [97.6 F (36.4 C)-98.7 F (37.1 C)] 97.7 F (36.5 C) (12/25 0814) Pulse Rate:  [39-117] 103  (12/25 0800) Resp:  [16-28] 20  (12/25 0800) BP: (96-127)/(56-70) 99/59 mmHg (12/25 0800) SpO2:  [94 %-100 %] 94 % (12/25 0800) Weight:  [88.8 kg (195 lb 12.3 oz)] 88.8 kg (195 lb 12.3 oz) (12/25 0515) HEMODYNAMICS:   VENTILATOR SETTINGS:   INTAKE / OUTPUT: Intake/Output      12/24 0701 - 12/25 0700 12/25 0701 - 12/26 0700   P.O. 720    I.V. (mL/kg) 580 (6.5)    Other 545 40   IV Piggyback 316    Total Intake(mL/kg) 2161 (24.3) 40 (0.5)   Urine (mL/kg/hr) 1255 (0.6)    Chest Tube 95    Total Output 1350    Net +811 +40        Stool Occurrence 3 x      PHYSICAL EXAMINATION: General: Deconditioned.  Looking better Neuro: no focal deficits HEENT:   WNL Cardiovascular: sinus with PACs, no M Lungs: diminished on L, no wheeze Abdomen:  Soft, NT, NABS Musculoskeletal:  Intact Skin:  Warm + le edema (chronic)   LABS: Cbc  Lab 07/26/12 0435 07/25/12 0400 07/24/12 0355  WBC 8.3 -- --  HGB 10.0* 9.5* 9.4*  HCT 29.8* 28.3* 28.2*  PLT 213 164 139*    Chemistry   Lab 07/26/12 0435 07/25/12 0400 07/24/12 0355 07/22/12 0330  NA 131* 126* 132* --  K 3.8 4.0 4.1 --  CL 98 95* 99 --  CO2 27 26 25  --  BUN 31* 28* 31* --  CREATININE 1.13 1.08 1.01 --  CALCIUM 8.6 8.3* 8.3* --  MG 1.9 -- -- 1.9  PHOS 2.6 -- -- 2.2*  GLUCOSE 115* 111* 115* --    Liver fxn  Lab 07/23/12 0450 07/20/12 1919  AST 14 16  ALT 13 17  ALKPHOS 86 117  BILITOT 1.2 1.0  PROT 4.8* 5.6*  ALBUMIN 1.8* 1.9*   coags  Lab 07/20/12 1919  APTT 36  INR 1.15   Sepsis markers No results found for this basename: LATICACIDVEN:3,PROCALCITON:3 in the last 168 hours Cardiac markers No results found for this basename: CKTOTAL:3,CKMB:3,TROPONINI:3 in the last 168 hours BNP  Lab 07/27/12 0500  PROBNP 3851.0*   ABG  Lab 07/22/12 0615 07/22/12 0328 07/21/12 1615  PHART 7.395 7.597* 7.513*  PCO2ART 38.6 25.7* 28.7*  PO2ART 91.0 248.0* 167.0*  HCO3 23.7 25.1* 22.9  TCO2 25 26 23.8    CBG trend  Lab 07/27/12 0755 07/26/12 2333 07/26/12 1754 07/26/12 1134 07/26/12 0607  GLUCAP 93 84 91 108* 100*   Dg Chest 2 View  07/27/2012  *RADIOLOGY REPORT*  Clinical Data: Effusions, cough, post VATS  CHEST - 2 VIEW  Comparison: Portable chest x-ray of 07/26/2012  Findings: The left chest tube remains in aeration of the lungs has improved minimally.  Basilar opacities remain consistent with atelectasis and effusions.  The right PICC line is unchanged in position and cardiomegaly remains.  IMPRESSION: Minimally improved aeration.  Left chest tube remains.  No change in position of the right PICC line.   Original Report Authenticated By: Dwyane Dee, M.D.    Dg Chest  Port 1 View  07/26/2012  *RADIOLOGY REPORT*  Clinical Data: Left pleural effusion  PORTABLE CHEST - 1 VIEW  Comparison: Yesterday  Findings: Right PICC and left chest tube stable.  No pneumothorax. Bilateral pleural effusions left greater than right stable. Bibasilar atelectasis verses airspace disease left greater than right stable.  Interstitial edema and vascular congestion improved.  IMPRESSION: Improved edema.   Original Report Authenticated By: Jolaine Click, M.D.       DIAGNOSES: Principal Problem:  *Acute postprocedural respiratory failure Active Problems:  ESSENTIAL HYPERTENSION  COPD (chronic obstructive pulmonary disease)  CAD (coronary artery disease)  Necrotizing pneumonia, community acquired MRSA  Dysphagia, pharyngeal  Pleural effusion  Atrial fibrillation  Pedal edema  Empyema of left pleural space  Atelectasis of left lung    PLAN: Chest tube  mgmt per TCTS - no air leak today Abx per ID Cont chest percussion flutter /IS , ezpap  Tygacil per ID for stenotrophomonas in BAL- should also cover MRSA Ct PO amio PT     Cyril Mourning MD. FCCP. [facility] Pager [phone] If no response call [phone]    07/27/2012 8:35 AM

## 2012-07-27 NOTE — Progress Notes (Signed)
Pt admitted from 2300, oriented to unit and routines, pain management and safety discussed, pt and wife verbalize understanding.  Son also at bedside

## 2012-07-27 NOTE — Progress Notes (Signed)
6 Days Post-Op Procedure(s) (LRB): VIDEO ASSISTED THORACOSCOPY (VATS)/EMPYEMA (Left) VIDEO BRONCHOSCOPY (Bilateral) Subjective: Status post left VATS and decortication of left empyema Chest x-ray today shows significant improvement in left lung aeration Chest tube drainage remains greater than 100 cc-- we'll not remove any chest tubes today Operative cultures reviewed by ID and antibiotics change to Tygacilin Overall patient is improving Objective: Vital signs in last 24 hours: Temp:  [97.6 F (36.4 C)-98.7 F (37.1 C)] 97.6 F (36.4 C) (12/25 1021) Pulse Rate:  [39-117] 84  (12/25 1021) Cardiac Rhythm:  [-] Atrial fibrillation (12/25 1021) Resp:  [16-28] 24  (12/25 1021) BP: (96-127)/(59-70) 101/67 mmHg (12/25 1021) SpO2:  [94 %-100 %] 97 % (12/25 1021) Weight:  [195 lb 12.3 oz (88.8 kg)] 195 lb 12.3 oz (88.8 kg) (12/25 0515)  Hemodynamic parameters for last 24 hours:   stable A. fib  Intake/Output from previous day: 12/24 0701 - 12/25 0700 In: 2161 [P.O.:720; I.V.:580; IV Piggyback:316] Out: 1350 [Urine:1255; Chest Tube:95] Intake/Output this shift: Total I/O In: 166 [P.O.:120; Other:40; IV Piggyback:6] Out: 100 [Urine:100]    Lab Results:  Basename 07/26/12 0435 07/25/12 0400  WBC 8.3 6.7  HGB 10.0* 9.5*  HCT 29.8* 28.3*  PLT 213 164   BMET:  Basename 07/26/12 0435 07/25/12 0400  NA 131* 126*  K 3.8 4.0  CL 98 95*  CO2 27 26  GLUCOSE 115* 111*  BUN 31* 28*  CREATININE 1.13 1.08  CALCIUM 8.6 8.3*    PT/INR: No results found for this basename: LABPROT,INR in the last 72 hours ABG    Component Value Date/Time   PHART 7.395 07/22/2012 0615   HCO3 23.7 07/22/2012 0615   TCO2 25 07/22/2012 0615   ACIDBASEDEF 1.0 07/22/2012 0615   O2SAT 97.0 07/22/2012 0615   CBG (last 3)   Basename 07/27/12 0755 07/26/12 2333 07/26/12 1754  GLUCAP 93 84 91    Assessment/Plan: S/P Procedure(s) (LRB): VIDEO ASSISTED THORACOSCOPY (VATS)/EMPYEMA (Left) VIDEO  BRONCHOSCOPY (Bilateral) Continue antibiotics, chest tube drainage and ambulation   LOS: 8 days    Christian [name],Christian [name] 07/27/2012

## 2012-07-28 ENCOUNTER — Inpatient Hospital Stay (HOSPITAL_COMMUNITY): Payer: Medicare Other

## 2012-07-28 LAB — BASIC METABOLIC PANEL
BUN: 43 mg/dL — ABNORMAL HIGH (ref 6–23)
CO2: 30 mEq/L (ref 19–32)
Calcium: 8.2 mg/dL — ABNORMAL LOW (ref 8.4–10.5)
Chloride: 97 mEq/L (ref 96–112)
Creatinine, Ser: 1.35 mg/dL (ref 0.50–1.35)
GFR calc Af Amer: 56 mL/min — ABNORMAL LOW (ref >90)
GFR calc non Af Amer: 49 mL/min — ABNORMAL LOW (ref >90)
Glucose, Bld: 101 mg/dL — ABNORMAL HIGH (ref 70–99)
Potassium: 3.5 mEq/L (ref 3.5–5.1)
Sodium: 132 mEq/L — ABNORMAL LOW (ref 135–145)

## 2012-07-28 LAB — CBC
HCT: 28.8 % — ABNORMAL LOW (ref 39.0–52.0)
Hemoglobin: 10 g/dL — ABNORMAL LOW (ref 13.0–17.0)
MCH: 29.4 pg (ref 26.0–34.0)
MCHC: 34.7 g/dL (ref 30.0–36.0)
MCV: 84.7 fL (ref 78.0–100.0)
Platelets: 207 10*3/uL (ref 150–400)
RBC: 3.4 MIL/uL — ABNORMAL LOW (ref 4.22–5.81)
RDW: 13.4 % (ref 11.5–15.5)
WBC: 7.9 10*3/uL (ref 4.0–10.5)

## 2012-07-28 LAB — GLUCOSE, CAPILLARY
Glucose-Capillary: 91 mg/dL (ref 70–99)
Glucose-Capillary: 95 mg/dL (ref 70–99)
Glucose-Capillary: 103 mg/dL — ABNORMAL HIGH (ref 70–99)
Glucose-Capillary: 102 mg/dL — ABNORMAL HIGH (ref 70–99)

## 2012-07-28 LAB — LIPASE, BLOOD: Lipase: 24 U/L (ref 11–59)

## 2012-07-28 LAB — HEPATIC FUNCTION PANEL
Alkaline Phosphatase: 109 U/L (ref 39–117)
Indirect Bilirubin: 0.3 mg/dL (ref 0.3–0.9)
Total Bilirubin: 0.6 mg/dL (ref 0.3–1.2)
Total Protein: 4.4 g/dL — ABNORMAL LOW (ref 6.0–8.3)

## 2012-07-28 MED ORDER — POTASSIUM CHLORIDE CRYS ER 20 MEQ PO TBCR
40.0000 meq | EXTENDED_RELEASE_TABLET | Freq: Every day | ORAL | Status: DC
  Administered 2012-07-28 – 2012-08-09 (×13): 40 meq via ORAL
  Filled 2012-07-28 (×13): qty 2

## 2012-07-28 MED ORDER — DIGOXIN 0.25 MG/ML IJ SOLN
0.2500 mg | Freq: Once | INTRAMUSCULAR | Status: AC
  Administered 2012-07-28: 0.25 mg via INTRAVENOUS
  Filled 2012-07-28 (×3): qty 1

## 2012-07-28 MED ORDER — DIGOXIN 125 MCG PO TABS
0.1250 mg | ORAL_TABLET | Freq: Every day | ORAL | Status: DC
  Administered 2012-07-29 – 2012-08-09 (×12): 0.125 mg via ORAL
  Filled 2012-07-28 (×12): qty 1

## 2012-07-28 MED ORDER — PANTOPRAZOLE SODIUM 40 MG PO TBEC
40.0000 mg | DELAYED_RELEASE_TABLET | Freq: Every morning | ORAL | Status: DC
  Administered 2012-07-29 – 2012-08-09 (×12): 40 mg via ORAL
  Filled 2012-07-28 (×11): qty 1

## 2012-07-28 MED ORDER — DIGOXIN 0.25 MG/ML IJ SOLN
0.2500 mg | Freq: Once | INTRAMUSCULAR | Status: AC
  Administered 2012-07-28: 0.25 mg via INTRAVENOUS
  Filled 2012-07-28: qty 1

## 2012-07-28 MED ORDER — DIGOXIN 0.25 MG/ML IJ SOLN
0.5000 mg | Freq: Once | INTRAMUSCULAR | Status: AC
  Administered 2012-07-28: 0.5 mg via INTRAVENOUS
  Filled 2012-07-28: qty 2

## 2012-07-28 NOTE — Progress Notes (Signed)
PULMONARY  / CRITICAL CARE MEDICINE  Name: TEJA JUDICE MRN: [id number] DOB: 1932/02/23    LOS: 9  REFERRING MD :  [facility] PCP: Kriste Basque CHIEF COMPLAINT:  Loculated left pleural effusion/empyema    BRIEF PATIENT DESCRIPTION:  78 yo male, PCP of [name], seen in office 12-17 with increased effusion. He was discharged from [facility] 11/25 to 12-10 to [facility] after treatment for MRSA necrotizing pna.  VATS/decortication 12/19 > empyema  LINES / TUBES: R IJ CVL 12/19 >> 12/20  ETT 12/19 >> 12/20; extubated  PICC 12/20 >>   CULTURES: 12/18 MRSA PCR >> POS 12/19 pleural fluid >> NEG 12/19 Bronch washings - STENOTROPHOMONAS (resistant to bactrim, intermediate to Ceftaz and Levoflox, sens to Minocyclin)   ANTIBIOTICS: Levaquin12/18 >> 12/20 vanc 12/18 >>12/23 Cefepime 12/18 >>12/23 TYGACIL 12/23 (ID consult) >>    SIGNIFICANT EVENTS:  12/20 VATS decortication. Findings c/w empyema 07/24/12: Adequate sats , no increased work of breathing  07/25/12: No complaints. Feels gasseous in abdomen and a bit tired but improving.    SUBJECTIVE/OVERNIGHT/INTERVAL HX  - Afebrile -foley had to be reinserted Denies pain, cough better -nausea and vomiting in am    VITAL SIGNS: Temp:  [97.8 F (36.6 C)-99 F (37.2 C)] 97.8 F (36.6 C) (12/26 1200) Pulse Rate:  [89-104] 93  (12/26 1200) Resp:  [17-28] 19  (12/26 1200) BP: (91-109)/(50-65) 91/50 mmHg (12/26 1200) SpO2:  [94 %-100 %] 97 % (12/26 1200) 2 liters  HEMODYNAMICS:   VENTILATOR SETTINGS:   INTAKE / OUTPUT: Intake/Output      12/25 0701 - 12/26 0700 12/26 0701 - 12/27 0700   P.O. 240 240   I.V. (mL/kg) 400.3 (4.5) 100 (1.1)   Other 40    IV Piggyback 6 300   Total Intake(mL/kg) 686.3 (7.7) 640 (7.2)   Urine (mL/kg/hr) 2175 (1) 775 (1.4)   Chest Tube 0    Total Output 2175 775   Net -1488.7 -135        Emesis Occurrence 1 x      PHYSICAL EXAMINATION: General: Deconditioned.  Looking better Neuro: no  focal deficits HEENT:  WNL Cardiovascular: sinus with PACs, no M Lungs: diminished on L, no wheeze, no airleak  Abdomen:  Soft, NT, NABS Musculoskeletal:  Intact Skin:  Warm + le edema (chronic)   LABS: Cbc  Lab 07/28/12 0410 07/26/12 0435 07/25/12 0400  WBC 7.9 -- --  HGB 10.0* 10.0* 9.5*  HCT 28.8* 29.8* 28.3*  PLT 207 213 164    Chemistry   Lab 07/28/12 0410 07/26/12 0435 07/25/12 0400 07/22/12 0330  NA 132* 131* 126* --  K 3.5 3.8 4.0 --  CL 97 98 95* --  CO2 30 27 26  --  BUN 43* 31* 28* --  CREATININE 1.35 1.13 1.08 --  CALCIUM 8.2* 8.6 8.3* --  MG -- 1.9 -- 1.9  PHOS -- 2.6 -- 2.2*  GLUCOSE 101* 115* 111* --    Liver fxn  Lab 07/23/12 0450  AST 14  ALT 13  ALKPHOS 86  BILITOT 1.2  PROT 4.8*  ALBUMIN 1.8*   coags No results found for this basename: APTT:3,INR:3 in the last 168 hours Sepsis markers No results found for this basename: LATICACIDVEN:3,PROCALCITON:3 in the last 168 hours Cardiac markers No results found for this basename: CKTOTAL:3,CKMB:3,TROPONINI:3 in the last 168 hours BNP  Lab 07/27/12 0500  PROBNP 3851.0*   ABG  Lab 07/22/12 0615 07/22/12 0328 07/21/12 1615  PHART 7.395 7.597* 7.513*  PCO2ART 38.6 25.7* 28.7*  PO2ART 91.0 248.0* 167.0*  HCO3 23.7 25.1* 22.9  TCO2 25 26 23.8    CBG trend  Lab 07/28/12 1239 07/28/12 0756 07/28/12 0607 07/27/12 2201 07/27/12 1609  GLUCAP 103* 95 91 104* 87   Dg Chest 2 View  07/28/2012  *RADIOLOGY REPORT*  Clinical Data: Empyema, follow-up, history of malignant melanoma  CHEST - 2 VIEW  Comparison: Chest x-ray of 07/27/2012  Findings: Two left chest tubes remain and no pneumothorax is seen. There is still opacity at both lung bases consistent with effusions, atelectasis, and possibly pneumonia particularly at the left lung base.  The right PICC line tip overlies the mid SVC. Cardiomegaly is stable.  Median sternotomy sutures again are noted.  IMPRESSION: No significant change in bibasilar  opacities consistent with effusions, atelectasis, possibly pneumonia.  The left chest tubes remain.   Original Report Authenticated By: Dwyane Dee, M.D.    Dg Chest 2 View  07/27/2012  *RADIOLOGY REPORT*  Clinical Data: Effusions, cough, post VATS  CHEST - 2 VIEW  Comparison: Portable chest x-ray of 07/26/2012  Findings: The left chest tube remains in aeration of the lungs has improved minimally.  Basilar opacities remain consistent with atelectasis and effusions.  The right PICC line is unchanged in position and cardiomegaly remains.  IMPRESSION: Minimally improved aeration.  Left chest tube remains.  No change in position of the right PICC line.   Original Report Authenticated By: Dwyane Dee, M.D.   agree no sig overall change. Bibasilar volume loss. Prob element of effusion on left.     DIAGNOSES: Principal Problem:  *Acute postprocedural respiratory failure Active Problems:  ESSENTIAL HYPERTENSION  COPD (chronic obstructive pulmonary disease)  CAD (coronary artery disease)  Necrotizing pneumonia, community acquired MRSA  Dysphagia, pharyngeal  Pleural effusion  Atrial fibrillation  Pedal edema  Empyema of left pleural space  Atelectasis of left lung  CT #1 removed 12/26,  Feeling better clinically. Marland Kitchen  PLAN: Chest tube  mgmt per TCTS - no air leak today Abx per ID Cont chest percussion flutter /IS , ezpap  Tygacil per ID for stenotrophomonas in BAL- should also cover MRSA 4/14 Amio d/ced due to nausea; Digoxin started; we will need to closely monitor  PT DVT prophylaxis on Lovenox    Agree wit the above. The patient was seen and examined. I personally reviewed the e MR including  Labs and X rays. Given that Mr. Michelle Piper is currently receiving Tygacil and he complains of nausea and vomiting we will review LFTs and lipase. If any concerns of liver or pancreatic toxicity due to Tygacil, we will consult with the ID team in regards to antibiotic adjustments.  Monitor for signs of  aspiration pneumonia, now second day with morning emesis.   Vanetta Mulders, MD  [facility]  [place], [place]   Pager [phone]  07/28/2012 1:06 PM

## 2012-07-28 NOTE — Clinical Social Work Psychosocial (Signed)
     Clinical Social Work Department BRIEF PSYCHOSOCIAL ASSESSMENT 07/28/2012  Patient:  Christian [name], Christian [name]     Account Number:  [id number]     Admit date:  07/19/2012  Clinical Social Worker:  Lourdes Sledge  Date/Time:  07/28/2012 10:17 AM  Referred by:  RN  Date Referred:  07/28/2012 Referred for  SNF Placement   Other Referral:   Interview type:  Family Other interview type:   CSW completed assessment with pt daughter Marijean Niemann [phone] as pt had asked CSW to speak with his daughter regarding dc plans.    PSYCHOSOCIAL DATA Living Status:  FACILITY Admitted from facility:  [facility] [place] Level of care:  Skilled Nursing Facility Primary support name:  Marijean Niemann [phone] Primary support relationship to patient:  CHILD, ADULT Degree of support available:   Pt wife and daughter actively involved in pt care.    CURRENT CONCERNS Current Concerns  Post-Acute Placement   Other Concerns:    SOCIAL WORK ASSESSMENT / PLAN CSW informed pt is from [facility] [place]. CSW visited pt room and pt requested for CSW to contact his daughter.    CSW contacted pt daughter and explored whether the plan is for pt to return to [facility] when medically stable. Daughter confirmed the plan is for pt to return to the facility at discharge. Daughter is not saving a bed at the facility however spoke with admissions director on 12/24 regarding pt returning.    CSW has a left a message for [place] [address] at  [facility]. CSW to facilitate with dc planning.   Assessment/plan status:  Psychosocial Support/Ongoing Assessment of Needs Other assessment/ plan:   Information/referral to community resources:   Pt does not need any resources at this time.    PATIENTS/FAMILYS RESPONSE TO PLAN OF CARE: Before pt surgery pt requested for CSW to speak to his daughter regarding dc plans. Pt daughter actively involved and confirmed dc plans.

## 2012-07-28 NOTE — Progress Notes (Addendum)
[address]            [place] [address]          [phone]     7 Days Post-Op Procedure(s) (LRB): VIDEO ASSISTED THORACOSCOPY (VATS)/EMPYEMA (Left) VIDEO BRONCHOSCOPY (Bilateral)  Subjective: OOB in chair.  Coughed up a large amount of mucus this am when he went for CXR.  Nausea improved and ate a good dinner last night.  Pain controlled.   Objective: Vital signs in last 24 hours: Patient Vitals for the past 24 hrs:  BP Temp Temp src Pulse Resp SpO2  07/28/12 0335 109/65 mmHg 97.8 F (36.6 C) Oral 98  24  94 %  07/28/12 0323 - - - 104  19  98 %  07/28/12 0000 98/54 mmHg 97.9 F (36.6 C) Oral 89  28  100 %  07/27/12 1952 - - - - - 97 %  07/27/12 1920 106/60 mmHg 99 F (37.2 C) - 95  18  97 %  07/27/12 1616 - 97.8 F (36.6 C) Oral - - -  07/27/12 1515 107/60 mmHg - - 100  22  96 %  07/27/12 1500 - - - - - 100 %  07/27/12 1244 - 97.7 F (36.5 C) Oral - - -  07/27/12 1240 97/48 mmHg - - 87  27  98 %  07/27/12 1021 101/67 mmHg 97.6 F (36.4 C) Oral 84  24  97 %  07/27/12 0900 - - - 104  17  97 %  07/27/12 0814 - 97.7 F (36.5 C) Oral - - -  07/27/12 0800 99/59 mmHg - - 103  20  94 %   Current Weight  07/27/12 195 lb 12.3 oz (88.8 kg)     Intake/Output from previous day: 12/25 0701 - 12/26 0700 In: 286 [P.O.:240; IV Piggyback:6] Out: 2175 [Urine:2175]    PHYSICAL EXAM:  Heart: RRR, tachy around 100 Lungs: diminished BS in bases bilaterally Wound: Dressed and dry Chest tube: No air leak    Lab Results: CBC: Basename 07/28/12 0410 07/26/12 0435  WBC 7.9 8.3  HGB 10.0* 10.0*  HCT 28.8* 29.8*  PLT 207 213   BMET:  Basename 07/28/12 0410 07/26/12 0435  NA 132* 131*  K 3.5 3.8  CL 97 98  CO2 30 27  GLUCOSE 101* 115*  BUN 43* 31*  CREATININE 1.35 1.13  CALCIUM 8.2* 8.6    PT/INR: No results found for this basename: LABPROT,INR in the last 72 hours  CXR: Findings: Two left chest tubes remain and no  pneumothorax is seen. There is still opacity at both lung bases consistent with  effusions, atelectasis, and possibly pneumonia particularly at the  left lung base. The right PICC line tip overlies the mid SVC.  Cardiomegaly is stable. Median sternotomy sutures again are noted.  IMPRESSION:  No significant change in bibasilar opacities consistent with  effusions, atelectasis, possibly pneumonia. The left chest tubes  remain.   Assessment/Plan: S/P Procedure(s) (LRB): VIDEO ASSISTED THORACOSCOPY (VATS)/EMPYEMA (Left) VIDEO BRONCHOSCOPY (Bilateral) ID- Tygacil D#4/14 for MRSA/Stenontrophomonas maltophili.  WBC stable, no fever. CT output low.  Hopefully can d/c 1 CT soon. Pulm- continue pulm toilet, diuresis. Cr up slightly this am.  Will monitor. ?d/c Foley. Mobilize as tolerated.   LOS: 9 days    COLLINS,GINA H 07/28/2012    Chart reviewed, patient examined, agree with above. He is  back in A-fib with RVR this am. Amio stopped yesterday because of nausea. Will start digoxin for rate control. His BP is borderline and with lung disease will avoid B-Blocker. CXR stable and minimal chest tube output so will remove anterior chest tube. He has a lot of edema. Continue diuretic. Keep foley in for diuresis.

## 2012-07-28 NOTE — Progress Notes (Signed)
Pt is refusing Chest Vest. He states that he is unable to tolerate at this time. The pt is working with the flutter and IS and is producing a moderate amount of think, tan secretions. He states that he would like to be checked with each scheduled time because he may feel better throughout the night. RT will continue to monitor.

## 2012-07-29 ENCOUNTER — Inpatient Hospital Stay (HOSPITAL_COMMUNITY): Payer: Medicare Other

## 2012-07-29 LAB — GLUCOSE, CAPILLARY: Glucose-Capillary: 128 mg/dL — ABNORMAL HIGH (ref 70–99)

## 2012-07-29 LAB — BASIC METABOLIC PANEL
Calcium: 8.2 mg/dL — ABNORMAL LOW (ref 8.4–10.5)
Chloride: 98 mEq/L (ref 96–112)
Creatinine, Ser: 1.4 mg/dL — ABNORMAL HIGH (ref 0.50–1.35)
GFR calc Af Amer: 54 mL/min — ABNORMAL LOW (ref >90)
GFR calc non Af Amer: 47 mL/min — ABNORMAL LOW (ref >90)

## 2012-07-29 LAB — DIGOXIN LEVEL: Digoxin Level: 2 ng/mL (ref 0.8–2.0)

## 2012-07-29 MED ORDER — MINOCYCLINE HCL 100 MG PO CAPS
100.0000 mg | ORAL_CAPSULE | Freq: Two times a day (BID) | ORAL | Status: DC
  Administered 2012-07-29 – 2012-08-09 (×22): 100 mg via ORAL
  Filled 2012-07-29 (×24): qty 1

## 2012-07-29 NOTE — Evaluation (Signed)
Physical Therapy Evaluation Patient Details Name: Christian [name] MRN: [id number] DOB: Jun 26, 1932 Today's Date: 07/29/2012 Time: 1191-4782 PT Time Calculation (min): 33 min  PT Assessment / Plan / Recommendation Clinical Impression  78 y/o male admitted 07/19/12 with SOB. Rapidly increasing left Pleural effusion consistent with empyema. Presents to PT today with below impairments limiting mobility and independence. Will benefit physical therapy in the acute setting to maximize strength and independence for safe d/c and decreased burden of care at [facility].     PT Assessment  Patient needs continued PT services    Follow Up Recommendations  SNF    Does the patient have the potential to tolerate intense rehabilitation      Barriers to Discharge        Equipment Recommendations  None recommended by PT    Recommendations for Other Services     Frequency Min 3X/week    Precautions / Restrictions Precautions Precautions: Fall Restrictions Weight Bearing Restrictions: No         Mobility  Bed Mobility Bed Mobility: Not assessed Transfers Sit to Stand: 4: Min guard;From chair/3-in-1;With armrests;With upper extremity assist Stand to Sit: 4: Min guard;To chair/3-in-1;With armrests;With upper extremity assist Details for Transfer Assistance: Min guard for safety. Increased time to complete transfer. Ambulation/Gait Ambulation/Gait Assistance: 4: Min guard Ambulation Distance (Feet): 280 Feet Assistive device: Other (Comment) (pushing RW) Ambulation/Gait Assistance Details: cues for tall posture and proximity to w/c Gait Pattern: Shuffle;Trunk flexed;Step-through pattern         Exercises General Exercises - Lower Extremity Ankle Circles/Pumps: AROM;Both;10 reps;Seated Long Arc Quad: AROM;Both;10 reps;Seated Toe Raises: AROM;Both;5 reps;Seated   PT Diagnosis: Difficulty walking;Generalized weakness;Acute pain  PT Problem List: Decreased activity tolerance;Decreased  strength;Decreased balance;Decreased mobility;Pain;Decreased knowledge of use of DME;Cardiopulmonary status limiting activity PT Treatment Interventions: DME instruction;Gait training;Functional mobility training;Therapeutic activities;Therapeutic exercise;Balance training;Patient/family education   PT Goals Acute Rehab PT Goals Pt will go Supine/Side to Sit: with modified independence PT Goal: Supine/Side to Sit - Progress: Goal set today Pt will go Sit to Supine/Side: with modified independence PT Goal: Sit to Supine/Side - Progress: Goal set today Pt will go Sit to Stand: with modified independence PT Goal: Sit to Stand - Progress: Goal set today Pt will Ambulate: >150 feet;with modified independence;with least restrictive assistive device PT Goal: Ambulate - Progress: Goal set today  Visit Information  Last PT Received On: 07/29/12 Assistance Needed: +1 (2 helpful for lines and leads)    Subjective Data  Subjective: I am a little nauseas.  Patient Stated Goal: rehab   Prior Functioning  Home Living Available Help at Discharge: Skilled Nursing Facility Type of Home: Skilled Nursing Facility Prior Function Level of Independence: Independent Comments: Prior to recent admission to [facility], pt was independent.    Cognition  Overall Cognitive Status: Appears within functional limits for tasks assessed/performed Arousal/Alertness: Awake/alert Orientation Level: Appears intact for tasks assessed Behavior During Session: [facility] for tasks performed    Extremity/Trunk Assessment Right Upper Extremity Assessment RUE ROM/Strength/Tone: [facility] for tasks assessed Left Upper Extremity Assessment LUE ROM/Strength/Tone: WFL for tasks assessed Right Lower Extremity Assessment RLE ROM/Strength/Tone: Deficits RLE ROM/Strength/Tone Deficits: increased swelling limiting ankle ROM, generalized weakness grossly 4/5 Left Lower Extremity Assessment LLE ROM/Strength/Tone: Deficits LLE ROM/Strength/Tone  Deficits: increased swelling limiting ankle ROM, generalized weakness grossly 4/5 Trunk Assessment Trunk Assessment: Kyphotic   Balance    End of Session PT - End of Session Equipment Utilized During Treatment: Gait belt Activity Tolerance: Patient tolerated treatment well Patient left:  in chair;with call bell/phone within reach Nurse Communication: Mobility status  GP     [facility] HELEN 07/29/2012, 3:24 PM

## 2012-07-29 NOTE — Progress Notes (Signed)
Resp care Note; Pt refusing CPT at this time.

## 2012-07-29 NOTE — Progress Notes (Signed)
PULMONARY  / CRITICAL CARE MEDICINE  Name: Christian [name] MRN: [id number] DOB: 02/03/1932    LOS: 10  REFERRING MD :  [facility] PCP: Kriste Basque CHIEF COMPLAINT:  Loculated left pleural effusion/empyema    BRIEF PATIENT DESCRIPTION:  78 yo male, PCP of [name], seen in office 12-17 with increased effusion. He was discharged from [facility] 11/25 to 12-10 to [facility] after treatment for MRSA necrotizing pna.  VATS/decortication 12/19 > empyema  LINES / TUBES: R IJ CVL 12/19 >> 12/20  ETT 12/19 >> 12/20; extubated  PICC 12/20 >>   CULTURES: 12/18 MRSA PCR >> POS 12/19 pleural fluid >> NEG 12/19 Bronch washings - STENOTROPHOMONAS (resistant to bactrim, intermediate to Ceftaz and Levoflox, sens to Minocyclin)   ANTIBIOTICS: Levaquin12/18 >> 12/20 vanc 12/18 >>12/23 Cefepime 12/18 >>12/23 TYGACIL 12/23 (ID consult) >> 12/27 not tolerating due to nausea and vomiting  Minocycline 12/27>>    SIGNIFICANT EVENTS:  12/20 VATS decortication. Findings c/w empyema 07/24/12: Adequate sats , no increased work of breathing  07/25/12: No complaints. Feels gasseous in abdomen and a bit tired but improving.    SUBJECTIVE/OVERNIGHT/INTERVAL HX  - Afebrile -denies pain, cough better -nausea and vomiting persists    VITAL SIGNS: Temp:  [97.4 F (36.3 C)-97.9 F (36.6 C)] 97.6 F (36.4 C) (12/27 1200) Pulse Rate:  [78-99] 99  (12/27 1200) Resp:  [14-27] 20  (12/27 1200) BP: (97-126)/(54-63) 104/55 mmHg (12/27 1200) SpO2:  [94 %-100 %] 96 % (12/27 1200) 2 liters  HEMODYNAMICS:   VENTILATOR SETTINGS:   INTAKE / OUTPUT: Intake/Output      12/26 0701 - 12/27 0700 12/27 0701 - 12/28 0700   P.O. 960 240   I.V. (mL/kg) 480 (5.4) 100 (1.1)   Other     IV Piggyback 300 200   Total Intake(mL/kg) 1740 (19.6) 540 (6.1)   Urine (mL/kg/hr) 2650 (1.2) 625 (0.8)   Chest Tube 250    Total Output 2900 625   Net -1160 -85        Stool Occurrence 1 x      PHYSICAL EXAMINATION: General:  Deconditioned.  Looking better Neuro: no focal deficits HEENT:  WNL Cardiovascular: sinus with PACs, no M Lungs: diminished on L, no wheeze, no airleak  Abdomen:  Soft, NT, NABS Musculoskeletal:  Intact Skin:  Warm + le edema (chronic)   LABS: Cbc  Lab 07/28/12 0410 07/26/12 0435 07/25/12 0400  WBC 7.9 -- --  HGB 10.0* 10.0* 9.5*  HCT 28.8* 29.8* 28.3*  PLT 207 213 164    Chemistry   Lab 07/29/12 0430 07/28/12 0410 07/26/12 0435  NA 134* 132* 131*  K 3.9 3.5 3.8  CL 98 97 98  CO2 30 30 27   BUN 46* 43* 31*  CREATININE 1.40* 1.35 1.13  CALCIUM 8.2* 8.2* 8.6  MG -- -- 1.9  PHOS -- -- 2.6  GLUCOSE 102* 101* 115*    Liver fxn  Lab 07/28/12 1550 07/23/12 0450  AST 15 14  ALT 13 13  ALKPHOS 109 86  BILITOT 0.6 1.2  PROT 4.4* 4.8*  ALBUMIN 1.7* 1.8*   coags No results found for this basename: APTT:3,INR:3 in the last 168 hours Sepsis markers No results found for this basename: LATICACIDVEN:3,PROCALCITON:3 in the last 168 hours Cardiac markers No results found for this basename: CKTOTAL:3,CKMB:3,TROPONINI:3 in the last 168 hours BNP  Lab 07/27/12 0500  PROBNP 3851.0*   ABG No results found for this basename: PHART:3,PCO2ART:3,PO2ART:3,HCO3:3,TCO2:3 in the last 168 hours  CBG trend  Lab 07/29/12 1125 07/29/12 0541 07/28/12 2332 07/28/12 1734 07/28/12 1239  GLUCAP 128* 96 117* 102* 103*   Dg Chest 2 View  07/29/2012  *RADIOLOGY REPORT*  Clinical Data: Productive cough.  Left empyema status post decortication.  CHEST - 2 VIEW  Comparison: 07/28/2012.  Findings: Trachea is midline.  Heart size is grossly stable.  Right PICC and a single left chest tube remain in place.  There are bilateral pleural effusions, small in size, left greater than right.  Tiny amount of left pleural air is seen laterally. Mild bibasilar air space disease.  Linear densities in the left upper lobe with left apical pleural thickening or pleural fluid.  IMPRESSION:  1.  Small left  hydropneumothorax with single left chest tube in place, stable. 2.  Left upper and left lower lobe air space disease are unchanged. 3.  Mild right basilar airspace disease and small pleural effusion, stable.   Original Report Authenticated By: Leanna Battles, M.D.    Dg Chest 2 View  07/28/2012  *RADIOLOGY REPORT*  Clinical Data: Empyema, follow-up, history of malignant melanoma  CHEST - 2 VIEW  Comparison: Chest x-ray of 07/27/2012  Findings: Two left chest tubes remain and no pneumothorax is seen. There is still opacity at both lung bases consistent with effusions, atelectasis, and possibly pneumonia particularly at the left lung base.  The right PICC line tip overlies the mid SVC. Cardiomegaly is stable.  Median sternotomy sutures again are noted.  IMPRESSION: No significant change in bibasilar opacities consistent with effusions, atelectasis, possibly pneumonia.  The left chest tubes remain.   Original Report Authenticated By: Dwyane Dee, M.D.   agree no sig overall change. Bibasilar volume loss. Prob element of effusion on left.     DIAGNOSES: Principal Problem:  *Acute postprocedural respiratory failure Active Problems:  ESSENTIAL HYPERTENSION  COPD (chronic obstructive pulmonary disease)  CAD (coronary artery disease)  Necrotizing pneumonia, community acquired MRSA  Dysphagia, pharyngeal  Pleural effusion  Atrial fibrillation  Pedal edema  Empyema of left pleural space  Atelectasis of left lung  CT #1 removed 12/26,  Feeling better clinically. Marland Kitchen  PLAN: Chest tube  mgmt per TCTS - no air leak today Abx discussed with Dr. Ninetta Lights Mr. Michelle Piper is currently receiving Tygacil and he complains of nausea and vomiting most likely related to this antibiotic. LFTs and lipase WNL.  We will change Tygacil to po Minocycline. With persistent nausea or evidence of worsening disease, we will consider iv minocycline.Minocycline  should also cover MRSA.  Antibiotics 5/14, Minocycline day 1.  Cont chest  percussion flutter /IS , ezpap  Digoxin started; Digoxin level pending. PT DVT prophylaxis on Lovenox      Vanetta Mulders, MD  [facility]  [place], [place]   Pager [phone]  07/29/2012 3:21 PM

## 2012-07-29 NOTE — Progress Notes (Signed)
Pt began to feel sick on stomach around 14:00 and is still nauseated. Pt does not want to do CPT because of this. RT worked with patient doing Flutter valve and IS. Pt is showing good effort and patient is having a productive cough.

## 2012-07-29 NOTE — Evaluation (Signed)
Occupational Therapy Evaluation Patient Details Name: Christian [name] MRN: [id number] DOB: 16-Jan-1932 Today's Date: 07/29/2012 Time: 7425-9563 OT Time Calculation (min): 33 min  OT Assessment / Plan / Recommendation Clinical Impression  Pt admitted with loculated left pleural effusion/empyema. Recent stay in [facility] from 11/25-12/10 for MRSA necrotizing PNA after which he was discharged to [facility].  Will continue to follow acutely to address below problem list.  Continue to recommend SNF to further progress rehab before eventual return home.      OT Assessment  Patient needs continued OT Services    Follow Up Recommendations  SNF    Barriers to Discharge      Equipment Recommendations  None recommended by OT    Recommendations for Other Services    Frequency  Min 2X/week    Precautions / Restrictions Precautions Precautions: Fall, chest tube Restrictions Weight Bearing Restrictions: No   Pertinent Vitals/Pain See vitals    ADL  Eating/Feeding: Performed;Modified independent Where Assessed - Eating/Feeding: Chair Grooming: Performed;Wash/dry face;Set up Where Assessed - Grooming: Supported sitting Toilet Transfer: Mining engineer Method: Sit to Barista: Other (comment) (recliner) Equipment Used: Gait belt;Wheelchair (pt pushed w/c for support) Transfers/Ambulation Related to ADLs: min guard during ambulation while pushing w/c. Pt ambulated around unit with standing rest breaks x2. ADL Comments: Educated pt on performing hand/wrist/elbow AROM to assist in decreasing swelling.     OT Diagnosis: Generalized weakness;Acute pain  OT Problem List: Decreased strength;Decreased activity tolerance;Decreased knowledge of use of DME or AE;Pain OT Treatment Interventions: Self-care/ADL training;DME and/or AE instruction;Therapeutic activities;Energy conservation;Patient/family education   OT Goals Acute Rehab OT Goals OT Goal  Formulation: With patient/family Time For Goal Achievement: 08/19/2012 Potential to Achieve Goals: Good ADL Goals Pt Will Perform Grooming: with supervision;Standing at sink ADL Goal: Grooming - Progress: Goal set today Pt Will Transfer to Toilet: with supervision;Ambulation;Regular height toilet ADL Goal: Toilet Transfer - Progress: Goal set today Pt Will Perform Toileting - Clothing Manipulation: with supervision;Sitting on 3-in-1 or toilet;Standing ADL Goal: Toileting - Clothing Manipulation - Progress: Goal set today Pt Will Perform Toileting - Hygiene: with supervision;Sit to stand from 3-in-1/toilet ADL Goal: Toileting - Hygiene - Progress: Goal set today Additional ADL Goal #1: Pt will perform all aspects of bathing/dressing ADLs at supervision level while independently initiating rest breaks as needed. ADL Goal: Additional Goal #1 - Progress: Goal set today Miscellaneous OT Goals Miscellaneous OT Goal #1: Pt will perform all functoinal mobility at supervision level during ADLs. OT Goal: Miscellaneous Goal #1 - Progress: Goal set today  Visit Information  Last OT Received On: 07/29/12 Assistance Needed: +1 (2 helpful for lines and leads) PT/OT Co-Evaluation/Treatment: Yes    Subjective Data      Prior Functioning     Home Living Available Help at Discharge: Skilled Nursing Facility Type of Home: Skilled Nursing Facility Prior Function Level of Independence: Independent Comments: Prior to recent admission to [facility], pt was independent.         Vision/Perception     Cognition  Overall Cognitive Status: Appears within functional limits for tasks assessed/performed Arousal/Alertness: Awake/alert Orientation Level: Appears intact for tasks assessed Behavior During Session: [facility]: Lambert Rhonda W for tasks performed    Extremity/Trunk Assessment Right Upper Extremity Assessment RUE ROM/Strength/Tone: [facility] for tasks assessed (increased swelling throughout) Left Upper Extremity  Assessment LUE ROM/Strength/Tone: WFL for tasks assessed (increased swelling thoughout) Right Lower Extremity Assessment RLE ROM/Strength/Tone: Deficits RLE ROM/Strength/Tone Deficits: increased swelling limiting ankle ROM, generalized weakness grossly 4/5 Left  Lower Extremity Assessment LLE ROM/Strength/Tone: Deficits LLE ROM/Strength/Tone Deficits: increased swelling limiting ankle ROM, generalized weakness grossly 4/5 Trunk Assessment Trunk Assessment: Kyphotic     Mobility Bed Mobility Bed Mobility: Not assessed Transfers Transfers: Sit to Stand;Stand to Sit Sit to Stand: 4: Min guard;From chair/3-in-1;With armrests;With upper extremity assist Stand to Sit: 4: Min guard;To chair/3-in-1;With armrests;With upper extremity assist Details for Transfer Assistance: Min guard for safety. Increased time to complete transfer.     Shoulder Instructions     Exercise    Balance     End of Session OT - End of Session Equipment Utilized During Treatment: Gait belt Activity Tolerance: Patient tolerated treatment well Patient left: in chair;with call bell/phone within reach Nurse Communication: Mobility status  GO    07/29/2012 Cipriano Mile OTR/L Pager [phone] Office [phone]  Cipriano Mile 07/29/2012, 3:25 PM

## 2012-07-29 NOTE — Progress Notes (Addendum)
                    [address]            [place] [address]          [phone]     8 Days Post-Op Procedure(s) (LRB): VIDEO ASSISTED THORACOSCOPY (VATS)/EMPYEMA (Left) VIDEO BRONCHOSCOPY (Bilateral)  Subjective: Still having "waves" of nausea, but better than before.  Breathing stable.    Objective: Vital signs in last 24 hours: Patient Vitals for the past 24 hrs:  BP Temp Temp src Pulse Resp SpO2  07/29/12 0400 126/55 mmHg 97.8 F (36.6 C) Oral 85  25  96 %  07/29/12 0207 - - - - - 99 %  07/29/12 0000 101/63 mmHg 97.9 F (36.6 C) Oral 98  14  97 %  07/28/12 2138 - - - 95  25  100 %  07/28/12 2000 97/54 mmHg 97.7 F (36.5 C) Oral 86  16  100 %  07/28/12 1600 107/61 mmHg 97.4 F (36.3 C) Oral 78  27  94 %  07/28/12 1453 - - - - - 98 %  07/28/12 1200 91/50 mmHg 97.8 F (36.6 C) Oral 93  19  97 %  07/28/12 0800 103/60 mmHg 97.8 F (36.6 C) Oral 102  17  100 %   Current Weight  07/27/12 195 lb 12.3 oz (88.8 kg)     Intake/Output from previous day: 12/26 0701 - 12/27 0700 In: 1720 [P.O.:960; I.V.:460; IV Piggyback:300] Out: 2900 [Urine:2650; Chest Tube:250]    PHYSICAL EXAM:  Heart: RRR, few PACs Lungs: Diminished BS in bases, L>R Wound: Clean and dry Chest tube: no air leak    Lab Results: CBC: Basename 07/28/12 0410  WBC 7.9  HGB 10.0*  HCT 28.8*  PLT 207   BMET:  Basename 07/29/12 0430 07/28/12 0410  NA 134* 132*  K 3.9 3.5  CL 98 97  CO2 30 30  GLUCOSE 102* 101*  BUN 46* 43*  CREATININE 1.40* 1.35  CALCIUM 8.2* 8.2*    PT/INR: No results found for this basename: LABPROT,INR in the last 72 hours    Assessment/Plan: S/P Procedure(s) (LRB): VIDEO ASSISTED THORACOSCOPY (VATS)/EMPYEMA (Left) VIDEO BRONCHOSCOPY (Bilateral) ID- Tygacil D#5/14 for MRSA/Stenontrophomonas maltophili.  Continue remaining CT to water seal.  Hopefully can d/c soon. Pulm- IS/pulm toilet.  Per CCM. ARI- Cr increased further today. UOP  remains excellent. Continue diuresis and monitor renal function.  Continue Foley for volume measurement. GI- still with nausea after d/c of Amio.  ?related to Tygacil.  Will continue to watch. CV- Had rapid AF yesterday, now mostly SR with PACs.  On Dig- will monitor closely.  BPs still low.    LOS: 10 days    COLLINS,GINA H 07/29/2012    Patient seen and examined. Agree with above Nausea and poor appetite in setting of chronic illness with protein calorie malnutrition(severe) and deconditioning Agree with plan to try alternative antibiotics Will stop lasix for now and watch creatinine- still looks a little total body overloaded but probably a little on the dry side intravascularly

## 2012-07-30 ENCOUNTER — Inpatient Hospital Stay (HOSPITAL_COMMUNITY): Payer: Medicare Other

## 2012-07-30 LAB — BASIC METABOLIC PANEL
GFR calc Af Amer: 52 mL/min — ABNORMAL LOW (ref >90)
GFR calc non Af Amer: 45 mL/min — ABNORMAL LOW (ref >90)
Potassium: 4.1 mEq/L (ref 3.5–5.1)
Sodium: 135 mEq/L (ref 135–145)

## 2012-07-30 LAB — CBC
Hemoglobin: 10.4 g/dL — ABNORMAL LOW (ref 13.0–17.0)
MCHC: 34 g/dL (ref 30.0–36.0)
Platelets: 233 10*3/uL (ref 150–400)
RBC: 3.53 MIL/uL — ABNORMAL LOW (ref 4.22–5.81)

## 2012-07-30 MED ORDER — FUROSEMIDE 10 MG/ML IJ SOLN
40.0000 mg | Freq: Every day | INTRAMUSCULAR | Status: DC
  Administered 2012-07-30: 40 mg via INTRAVENOUS
  Filled 2012-07-30: qty 4

## 2012-07-30 MED ORDER — ENOXAPARIN SODIUM 30 MG/0.3ML ~~LOC~~ SOLN
30.0000 mg | SUBCUTANEOUS | Status: DC
  Administered 2012-07-30: 30 mg via SUBCUTANEOUS
  Filled 2012-07-30 (×2): qty 0.3

## 2012-07-30 NOTE — Progress Notes (Signed)
Resp Care Note; Pt refusing chest vest.

## 2012-07-30 NOTE — Progress Notes (Signed)
Resp Care Note; Pt refused all CPT txs tonight.

## 2012-07-30 NOTE — Progress Notes (Addendum)
[address]            [place] [address]          [phone]     9 Days Post-Op  Procedure(s) (LRB): VIDEO ASSISTED THORACOSCOPY (VATS)/EMPYEMA (Left) VIDEO BRONCHOSCOPY (Bilateral) Subjective: Nausea better, able to eat better this am  Objective  Telemetry afib/SR  Temp:  [97.6 F (36.4 C)-98.7 F (37.1 C)] 98 F (36.7 C) (12/28 0743) Pulse Rate:  [89-99] 97  (12/28 0400) Resp:  [19-21] 19  (12/28 0400) BP: (104-117)/(50-59) 117/53 mmHg (12/28 0400) SpO2:  [95 %-98 %] 96 % (12/28 0400)   Intake/Output Summary (Last 24 hours) at 07/30/12 0913 Last data filed at 07/30/12 0800  Gross per 24 hour  Intake   1260 ml  Output   1275 ml  Net    -15 ml       General appearance: alert, cooperative and no distress Heart: regular rate and rhythm Lungs: dim in bases Abdomen: + distension, nontender Extremities: marked edema Wound: incisions healing well  Lab Results:  Basename 07/30/12 0410 07/29/12 0430  NA 135 134*  K 4.1 3.9  CL 99 98  CO2 31 30  GLUCOSE 103* 102*  BUN 48* 46*  CREATININE 1.45* 1.40*  CALCIUM 8.2* 8.2*  MG -- --  PHOS -- --    Basename 07/28/12 1550  AST 15  ALT 13  ALKPHOS 109  BILITOT 0.6  PROT 4.4*  ALBUMIN 1.7*    Basename 07/28/12 1550  LIPASE 24  AMYLASE --    Basename 07/30/12 0410 07/28/12 0410  WBC 9.0 7.9  NEUTROABS -- --  HGB 10.4* 10.0*  HCT 30.6* 28.8*  MCV 86.7 84.7  PLT 233 207   No results found for this basename: CKTOTAL:4,CKMB:4,TROPONINI:4 in the last 72 hours No components found with this basename: POCBNP:3 No results found for this basename: DDIMER in the last 72 hours No results found for this basename: HGBA1C in the last 72 hours No results found for this basename: CHOL,HDL,LDLCALC,TRIG,CHOLHDL in the last 72 hours No results found for this basename: TSH,T4TOTAL,FREET3,T3FREE,THYROIDAB in the last 72 hours No results found for this basename:  VITAMINB12,FOLATE,FERRITIN,TIBC,IRON,RETICCTPCT in the last 72 hours  Medications: Scheduled    . antiseptic oral rinse  15 mL Mouth Rinse QID  . chlorhexidine  15 mL Mouth Rinse BID  . digoxin  0.125 mg Oral Daily  . enoxaparin  40 mg Subcutaneous Q24H  . feeding supplement  237 mL Oral TID BM  . feeding supplement  1 Container Oral TID WC  . ipratropium  0.5 mg Nebulization Q6H  . lactose free nutrition  237 mL Oral TID WC  . levalbuterol  0.63 mg Nebulization Q6H  . minocycline  100 mg Oral Q12H  . pantoprazole  40 mg Oral q morning - 10a  . potassium chloride  40 mEq Oral Daily  . sodium chloride  10-40 mL Intracatheter Q12H  . Tamsulosin HCl  0.4 mg Oral BID     Radiology/Studies:  Dg Chest 2 View  07/30/2012  *RADIOLOGY REPORT*  Clinical Data: Left empyema.  Status post decortication.  CHEST - 2 VIEW  Comparison: 07/29/2012  Findings: Bilateral pleural effusions and bibasilar opacities, presumably atelectasis again noted, unchanged.  Heart is enlarged. Prior CABG.  Left chest tube is in place.  No visible pneumothorax currently.  No acute bony abnormality.  IMPRESSION: No  current visible left pneumothorax.  Continued bilateral effusions and bibasilar opacities.   Original Report Authenticated By: Charlett Nose, M.D.    Dg Chest 2 View  07/29/2012  *RADIOLOGY REPORT*  Clinical Data: Productive cough.  Left empyema status post decortication.  CHEST - 2 VIEW  Comparison: 07/28/2012.  Findings: Trachea is midline.  Heart size is grossly stable.  Right PICC and a single left chest tube remain in place.  There are bilateral pleural effusions, small in size, left greater than right.  Tiny amount of left pleural air is seen laterally. Mild bibasilar air space disease.  Linear densities in the left upper lobe with left apical pleural thickening or pleural fluid.  IMPRESSION:  1.  Small left hydropneumothorax with single left chest tube in place, stable. 2.  Left upper and left lower lobe air  space disease are unchanged. 3.  Mild right basilar airspace disease and small pleural effusion, stable.   Original Report Authenticated By: Leanna Battles, M.D.     INR: Will add last result for INR, ABG once components are confirmed Will add last 4 CBG results once components are confirmed  Assessment/Plan: S/P Procedure(s) (LRB): VIDEO ASSISTED THORACOSCOPY (VATS)/EMPYEMA (Left) VIDEO BRONCHOSCOPY (Bilateral)  1 nausea better of tygacil 2 creat up a little further, prob intravasc dry, push nutrition as able Poss d/c tube soon, 25 cc drainage yest. but mod effusion persists    LOS: 11 days    GOLD,[name] 12/28/20139:13 AM   Says he ate a good breakfast D/c CT Still very edematous, creatinine stable, will restart lasix once daily

## 2012-07-30 NOTE — Progress Notes (Signed)
Resp Care Note; Pt refusing CPT,states he isn't feeling well and the CPT makes him feel worse.

## 2012-07-30 NOTE — Progress Notes (Signed)
PULMONARY  / CRITICAL CARE MEDICINE  Name: Christian [name] MRN: [id number] DOB: [birth year]    LOS: 11  REFERRING MD :  [facility] PCP: Kriste Basque CHIEF COMPLAINT:  Loculated left pleural effusion/empyema    BRIEF PATIENT DESCRIPTION:  78 yo male, PCP of [name], seen in office 12-17 with increased effusion. He was discharged from [facility] 11/25 to 12-10 to [facility] after treatment for MRSA necrotizing pna.  VATS/decortication 12/19 > empyema  LINES / TUBES: R IJ CVL 12/19 >> 12/20  ETT 12/19 >> 12/20; extubated  PICC 12/20 >>   CULTURES: 12/18 MRSA PCR >> POS 12/19 pleural fluid >> NEG 12/19 Bronch washings - STENOTROPHOMONAS (resistant to bactrim, intermediate to Ceftaz and Levoflox, sens to Minocyclin)   ANTIBIOTICS: Levaquin12/18 >> 12/20 vanc 12/18 >>12/23 Cefepime 12/18 >>12/23 TYGACIL 12/23 (ID consult) >> 12/27 not tolerating due to nausea and vomiting  Minocycline 12/27>>    SIGNIFICANT EVENTS:  12/20 VATS decortication. Findings c/w empyema 07/24/12: Adequate sats , no increased work of breathing  07/25/12: No complaints. Feels gasseous in abdomen and a bit tired but improving.    SUBJECTIVE/OVERNIGHT/INTERVAL HX He reports nausea resolved, able to eat breakfast Mild chest tube site pain with deep breath or cough. Cough productive scanter and clearer   VITAL SIGNS: Temp:  [97.6 F (36.4 C)-98.7 F (37.1 C)] 98 F (36.7 C) (12/28 0743) Pulse Rate:  [89-99] 97  (12/28 0400) Resp:  [19-21] 19  (12/28 0400) BP: (104-117)/(50-59) 117/53 mmHg (12/28 0400) SpO2:  [95 %-100 %] 100 % (12/28 0934) 2 liters  HEMODYNAMICS:   VENTILATOR SETTINGS:   INTAKE / OUTPUT: Intake/Output      12/27 0701 - 12/28 0700 12/28 0701 - 12/29 0700   P.O. 840    I.V. (mL/kg) 480 (5.4) 20 (0.2)   IV Piggyback 200    Total Intake(mL/kg) 1520 (17.1) 20 (0.2)   Urine (mL/kg/hr) 1375 (0.6)    Chest Tube 25    Total Output 1400    Net +120 +20        Stool Occurrence 3 x 1 x      PHYSICAL EXAMINATION: General: Deconditioned. Frail, NAD Neuro: no focal deficits HEENT:  WNL Cardiovascular: sinus with PACs, no M Lungs: diminished on L, no wheeze, no airleak, Sero-sanguinous fluid in tube Abdomen:  Soft, NT, NABS Musculoskeletal:  Intact Skin:  Warm + le edema (chronic)   LABS: Cbc  Lab 07/30/12 0410 07/28/12 0410 07/26/12 0435  WBC 9.0 -- --  HGB 10.4* 10.0* 10.0*  HCT 30.6* 28.8* 29.8*  PLT 233 207 213    Chemistry   Lab 07/30/12 0410 07/29/12 0430 07/28/12 0410 07/26/12 0435  NA 135 134* 132* --  K 4.1 3.9 3.5 --  CL 99 98 97 --  CO2 31 30 30  --  BUN 48* 46* 43* --  CREATININE 1.45* 1.40* 1.35 --  CALCIUM 8.2* 8.2* 8.2* --  MG -- -- -- 1.9  PHOS -- -- -- 2.6  GLUCOSE 103* 102* 101* --    Liver fxn  Lab 07/28/12 1550  AST 15  ALT 13  ALKPHOS 109  BILITOT 0.6  PROT 4.4*  ALBUMIN 1.7*   coags No results found for this basename: APTT:3,INR:3 in the last 168 hours Sepsis markers No results found for this basename: LATICACIDVEN:3,PROCALCITON:3 in the last 168 hours Cardiac markers No results found for this basename: CKTOTAL:3,CKMB:3,TROPONINI:3 in the last 168 hours BNP  Lab 07/27/12 0500  PROBNP 3851.0*   ABG  No results found for this basename: PHART:3,PCO2ART:3,PO2ART:3,HCO3:3,TCO2:3 in the last 168 hours  CBG trend  Lab 07/29/12 1125 07/29/12 0541 07/28/12 2332 07/28/12 1734 07/28/12 1239  GLUCAP 128* 96 117* 102* 103*   Dg Chest 2 View  07/30/2012  *RADIOLOGY REPORT*  Clinical Data: Left empyema.  Status post decortication.  CHEST - 2 VIEW  Comparison: 07/29/2012  Findings: Bilateral pleural effusions and bibasilar opacities, presumably atelectasis again noted, unchanged.  Heart is enlarged. Prior CABG.  Left chest tube is in place.  No visible pneumothorax currently.  No acute bony abnormality.  IMPRESSION: No current visible left pneumothorax.  Continued bilateral effusions and bibasilar opacities.   Original Report  Authenticated By: Charlett Nose, M.D.    Dg Chest 2 View  07/29/2012  *RADIOLOGY REPORT*  Clinical Data: Productive cough.  Left empyema status post decortication.  CHEST - 2 VIEW  Comparison: 07/28/2012.  Findings: Trachea is midline.  Heart size is grossly stable.  Right PICC and a single left chest tube remain in place.  There are bilateral pleural effusions, small in size, left greater than right.  Tiny amount of left pleural air is seen laterally. Mild bibasilar air space disease.  Linear densities in the left upper lobe with left apical pleural thickening or pleural fluid.  IMPRESSION:  1.  Small left hydropneumothorax with single left chest tube in place, stable. 2.  Left upper and left lower lobe air space disease are unchanged. 3.  Mild right basilar airspace disease and small pleural effusion, stable.   Original Report Authenticated By: Leanna Battles, M.D.   Imaging reviewed- fluid on L. ATX +/- fluid on L     DIAGNOSES: Principal Problem:  *Acute postprocedural respiratory failure Active Problems:  ESSENTIAL HYPERTENSION  COPD (chronic obstructive pulmonary disease)  CAD (coronary artery disease)  Necrotizing pneumonia, community acquired MRSA  Dysphagia, pharyngeal  Pleural effusion  Atrial fibrillation  Pedal edema  Empyema of left pleural space  Atelectasis of left lung  CT #1 removed 12/26,  Feeling better clinically. Marland Kitchen  PLAN: Chest tube  mgmt per TCTS - no air leak today Tolerating po minocycline with no fever, WBC 9K, nausea resolving off tygacil  LFTs and lipase WNL.   With persistent nausea or evidence of worsening disease, we will consider iv minocycline.Minocycline  should also cover MRSA.  Antibiotics 5/14, Minocycline day 2.  Cont chest percussion flutter /IS , ezpap  Digoxin started; Digoxin level pending. PT DVT prophylaxis on Lovenox      CD Young, MD PCCM [phone]  07/30/2012 10:01 AM

## 2012-07-31 ENCOUNTER — Inpatient Hospital Stay (HOSPITAL_COMMUNITY): Payer: Medicare Other

## 2012-07-31 LAB — BASIC METABOLIC PANEL
CO2: 32 mEq/L (ref 19–32)
Glucose, Bld: 109 mg/dL — ABNORMAL HIGH (ref 70–99)
Potassium: 4.1 mEq/L (ref 3.5–5.1)
Sodium: 138 mEq/L (ref 135–145)

## 2012-07-31 MED ORDER — GUAIFENESIN ER 600 MG PO TB12
1200.0000 mg | ORAL_TABLET | Freq: Two times a day (BID) | ORAL | Status: DC
  Administered 2012-07-31 – 2012-08-09 (×19): 1200 mg via ORAL
  Filled 2012-07-31 (×20): qty 2

## 2012-07-31 MED ORDER — ENOXAPARIN SODIUM 40 MG/0.4ML ~~LOC~~ SOLN
40.0000 mg | SUBCUTANEOUS | Status: DC
  Administered 2012-07-31 – 2012-08-08 (×9): 40 mg via SUBCUTANEOUS
  Filled 2012-07-31 (×10): qty 0.4

## 2012-07-31 MED ORDER — FUROSEMIDE 40 MG PO TABS
40.0000 mg | ORAL_TABLET | Freq: Every day | ORAL | Status: DC
  Administered 2012-07-31 – 2012-08-01 (×2): 40 mg via ORAL
  Filled 2012-07-31 (×3): qty 1

## 2012-07-31 NOTE — Progress Notes (Signed)
PULMONARY  / CRITICAL CARE MEDICINE  Name: Christian [name] MRN: [id number] DOB: Feb 15, 1932    LOS: 12  REFERRING MD :  Christian [name] PCP: Christian [name] CHIEF COMPLAINT:  Loculated left pleural effusion/empyema    BRIEF PATIENT DESCRIPTION:  78 yo male, PCP of Christian [name], seen in office 12-17 with increased effusion. He was discharged from [facility] 11/25 to 12-10 to [facility] after treatment for MRSA necrotizing pna.  VATS/decortication 12/19 > empyema  LINES / TUBES: R IJ CVL 12/19 >> 12/20  ETT 12/19 >> 12/20; extubated  PICC 12/20 >>   CULTURES: 12/18 MRSA PCR >> POS 12/19 pleural fluid >> NEG 12/19 Bronch washings - STENOTROPHOMONAS (resistant to bactrim, intermediate to Ceftaz and Levoflox, sens to Minocyclin)   ANTIBIOTICS: Levaquin12/18 >> 12/20 vanc 12/18 >>12/23 Cefepime 12/18 >>12/23 TYGACIL 12/23 (ID consult) >> 12/27 not tolerating due to nausea and vomiting  Minocycline 12/27>>    SIGNIFICANT EVENTS:  12/20 VATS decortication. Findings c/w empyema 07/24/12: Adequate sats , no increased work of breathing  07/25/12: No complaints. Feels gasseous in abdomen and a bit tired but improving.    SUBJECTIVE/OVERNIGHT/INTERVAL HX Leaving room to ambulate w/ assistance. States he feels better, less cough remains productive.   VITAL SIGNS: Temp:  [97.7 F (36.5 C)-98.2 F (36.8 C)] 97.7 F (36.5 C) (12/29 0400) Pulse Rate:  [84-99] 84  (12/29 0400) Resp:  [18-22] 22  (12/29 0400) BP: (105-125)/(46-94) 105/62 mmHg (12/29 0400) SpO2:  [95 %-100 %] 96 % (12/29 0905) 2 liters  HEMODYNAMICS:   VENTILATOR SETTINGS:   INTAKE / OUTPUT: Intake/Output      12/28 0701 - 12/29 0700 12/29 0701 - 12/30 0700   P.O.     I.V. (mL/kg) 440 (5)    IV Piggyback     Total Intake(mL/kg) 440 (5)    Urine (mL/kg/hr) 1600 (0.8)    Stool 1    Chest Tube     Total Output 1601    Net -1161         Stool Occurrence 1 x      PHYSICAL EXAMINATION: General: Deconditioned. Frail, NAD,  Ambulates leaning on wheelchair Neuro: no focal deficits HEENT:  WNL Cardiovascular: sinus with PACs, no M Lungs: Dull L>R base, Sero-sanguinous fluid in tube Abdomen:   Musculoskeletal:  Intact Skin:  Warm + le edema (chronic)   LABS: Cbc  Lab 07/30/12 0410 07/28/12 0410 07/26/12 0435  WBC 9.0 -- --  HGB 10.4* 10.0* 10.0*  HCT 30.6* 28.8* 29.8*  PLT 233 207 213    Chemistry   Lab 07/31/12 0425 07/30/12 0410 07/29/12 0430 07/26/12 0435  NA 138 135 134* --  K 4.1 4.1 3.9 --  CL 102 99 98 --  CO2 32 31 30 --  BUN 48* 48* 46* --  CREATININE 1.44* 1.45* 1.40* --  CALCIUM 8.0* 8.2* 8.2* --  MG -- -- -- 1.9  PHOS -- -- -- 2.6  GLUCOSE 109* 103* 102* --    Liver fxn  Lab 07/28/12 1550  AST 15  ALT 13  ALKPHOS 109  BILITOT 0.6  PROT 4.4*  ALBUMIN 1.7*   coags No results found for this basename: APTT:3,INR:3 in the last 168 hours Sepsis markers No results found for this basename: LATICACIDVEN:3,PROCALCITON:3 in the last 168 hours Cardiac markers No results found for this basename: CKTOTAL:3,CKMB:3,TROPONINI:3 in the last 168 hours BNP  Lab 07/27/12 0500  PROBNP 3851.0*   ABG No results found for this basename: PHART:3,PCO2ART:3,PO2ART:3,HCO3:3,TCO2:3 in the  last 168 hours  CBG trend  Lab 07/29/12 1125 07/29/12 0541 07/28/12 2332 07/28/12 1734 07/28/12 1239  GLUCAP 128* 96 117* 102* 103*   Dg Chest 2 View  07/30/2012  *RADIOLOGY REPORT*  Clinical Data: Left empyema.  Status post decortication.  CHEST - 2 VIEW  Comparison: 07/29/2012  Findings: Bilateral pleural effusions and bibasilar opacities, presumably atelectasis again noted, unchanged.  Heart is enlarged. Prior CABG.  Left chest tube is in place.  No visible pneumothorax currently.  No acute bony abnormality.  IMPRESSION: No current visible left pneumothorax.  Continued bilateral effusions and bibasilar opacities.   Original Report Authenticated By: Charlett Nose, M.D.    Dg Chest Port 1  View  07/31/2012  *RADIOLOGY REPORT*  Clinical Data: Status post decortication  PORTABLE CHEST - 1 VIEW  Comparison: 07/30/2012  Findings: Previous median sternotomy CABG procedure.  There is a right arm PICC line with tip in the cavoatrial junction.  Similar appearance of cardiac enlargement.  Interval increase in volume of bilateral pleural effusions, left greater than right.  Airspace consolidation within the left upper lobe and left midlung has increased in the interval.  IMPRESSION:  1.  Increase in pleural effusions 2.  Worsening aeration to the left lung.   Original Report Authenticated By: Signa Kell, M.D.   Imaging reviewed-     DIAGNOSES: Principal Problem:  *Acute postprocedural respiratory failure Active Problems:  ESSENTIAL HYPERTENSION  COPD (chronic obstructive pulmonary disease)  CAD (coronary artery disease)  Necrotizing pneumonia, community acquired MRSA  Dysphagia, pharyngeal  Pleural effusion  Atrial fibrillation  Pedal edema  Empyema of left pleural space  Atelectasis of left lung  CT #1 removed 12/26,  Feeling better clinically. Marland Kitchen  PLAN: Chest tube  mgmt per TCTS - no air leak today Tolerating po minocycline with no fever, WBC 9K, nausea resolving off tygacil, Needs more aggressive pulmonary toilet- possibly bronch for secretion clearance  LFTs and lipase WNL.  With evidence of worsening disease, we will consider iv minocycline. Minocycline  should also cover MRSA.  Antibiotics 5/14, Minocycline day 3.  Cont chest percussion flutter /IS , ezpap and ambulation Digoxin started; Digoxin level pending. PT DVT prophylaxis on Lovenox      CD Christian Longwell, MD PCCM [phone]  07/31/2012 9:35 AM

## 2012-07-31 NOTE — Progress Notes (Signed)
Resp Care Note; Pt refusing chest vest. 

## 2012-07-31 NOTE — Progress Notes (Signed)
Resp Care Note; Pt refusing Chest vest.

## 2012-07-31 NOTE — Progress Notes (Addendum)
[address]            [place] [address]          [phone]     10 Days Post-Op  Procedure(s) (LRB): VIDEO ASSISTED THORACOSCOPY (VATS)/EMPYEMA (Left) VIDEO BRONCHOSCOPY (Bilateral) Subjective: Feels a little better, sob improving  Objective  Telemetry sinus rhythm  Temp:  [97.7 F (36.5 C)-98.2 F (36.8 C)] 97.7 F (36.5 C) (12/29 0400) Pulse Rate:  [84-99] 84  (12/29 0400) Resp:  [18-22] 22  (12/29 0400) BP: (105-125)/(46-94) 105/62 mmHg (12/29 0400) SpO2:  [95 %-100 %] 96 % (12/29 0400)   Intake/Output Summary (Last 24 hours) at 07/31/12 0853 Last data filed at 07/31/12 0600  Gross per 24 hour  Intake    420 ml  Output   1601 ml  Net  -1181 ml       General appearance: alert, cooperative, fatigued and no distress Heart: regular rate and rhythm Lungs: coarse and diminished throughout Abdomen: soft, + BS, nontender, + distension Extremities: + significant edema persists Wound: healing well  Lab Results:  Basename 07/31/12 0425 07/30/12 0410  NA 138 135  K 4.1 4.1  CL 102 99  CO2 32 31  GLUCOSE 109* 103*  BUN 48* 48*  CREATININE 1.44* 1.45*  CALCIUM 8.0* 8.2*  MG -- --  PHOS -- --    Basename 07/28/12 1550  AST 15  ALT 13  ALKPHOS 109  BILITOT 0.6  PROT 4.4*  ALBUMIN 1.7*    Basename 07/28/12 1550  LIPASE 24  AMYLASE --    Basename 07/30/12 0410  WBC 9.0  NEUTROABS --  HGB 10.4*  HCT 30.6*  MCV 86.7  PLT 233   No results found for this basename: CKTOTAL:4,CKMB:4,TROPONINI:4 in the last 72 hours No components found with this basename: POCBNP:3 No results found for this basename: DDIMER in the last 72 hours No results found for this basename: HGBA1C in the last 72 hours No results found for this basename: CHOL,HDL,LDLCALC,TRIG,CHOLHDL in the last 72 hours No results found for this basename: TSH,T4TOTAL,FREET3,T3FREE,THYROIDAB in the last 72 hours No results found for this basename:  VITAMINB12,FOLATE,FERRITIN,TIBC,IRON,RETICCTPCT in the last 72 hours  Medications: Scheduled    . antiseptic oral rinse  15 mL Mouth Rinse QID  . chlorhexidine  15 mL Mouth Rinse BID  . digoxin  0.125 mg Oral Daily  . enoxaparin  30 mg Subcutaneous Q24H  . feeding supplement  237 mL Oral TID BM  . feeding supplement  1 Container Oral TID WC  . furosemide  40 mg Intravenous Daily  . ipratropium  0.5 mg Nebulization Q6H  . lactose free nutrition  237 mL Oral TID WC  . levalbuterol  0.63 mg Nebulization Q6H  . minocycline  100 mg Oral Q12H  . pantoprazole  40 mg Oral q morning - 10a  . potassium chloride  40 mEq Oral Daily  . sodium chloride  10-40 mL Intracatheter Q12H  . Tamsulosin HCl  0.4 mg Oral BID     Radiology/Studies:  Dg Chest 2 View  07/30/2012  *RADIOLOGY REPORT*  Clinical Data: Left empyema.  Status post decortication.  CHEST - 2 VIEW  Comparison: 07/29/2012  Findings: Bilateral pleural effusions and bibasilar opacities, presumably atelectasis again noted, unchanged.  Heart is enlarged. Prior CABG.  Left chest tube is in place.  No visible pneumothorax currently.  No acute bony  abnormality.  IMPRESSION: No current visible left pneumothorax.  Continued bilateral effusions and bibasilar opacities.   Original Report Authenticated By: Charlett Nose, M.D.    Dg Chest Port 1 View  07/31/2012  *RADIOLOGY REPORT*  Clinical Data: Status post decortication  PORTABLE CHEST - 1 VIEW  Comparison: 07/30/2012  Findings: Previous median sternotomy CABG procedure.  There is a right arm PICC line with tip in the cavoatrial junction.  Similar appearance of cardiac enlargement.  Interval increase in volume of bilateral pleural effusions, left greater than right.  Airspace consolidation within the left upper lobe and left midlung has increased in the interval.  IMPRESSION:  1.  Increase in pleural effusions 2.  Worsening aeration to the left lung.   Original Report Authenticated By: Signa Kell,  M.D.     INR: Will add last result for INR, ABG once components are confirmed Will add last 4 CBG results once components are confirmed  Assessment/Plan: S/P Procedure(s) (LRB): VIDEO ASSISTED THORACOSCOPY (VATS)/EMPYEMA (Left) VIDEO BRONCHOSCOPY (Bilateral)  1 stable overall with slow clinical progress 2 renal fxn stable, cont IV lasix 3 cont minocycline 4 observe effusions closely as Left has enlarged 5 cont pulm rx   LOS: 12 days    GOLD,[name] 12/29/20138:53 AM    Patient seen and examined Agree with above Aeration at left base has worsened- I suspect his is more atelectasis than effusion- will push pulmonary hygiene

## 2012-08-01 ENCOUNTER — Inpatient Hospital Stay (HOSPITAL_COMMUNITY): Payer: Medicare Other

## 2012-08-01 DIAGNOSIS — J209 Acute bronchitis, unspecified: Secondary | ICD-10-CM

## 2012-08-01 LAB — BASIC METABOLIC PANEL
BUN: 45 mg/dL — ABNORMAL HIGH (ref 6–23)
Chloride: 99 mEq/L (ref 96–112)
GFR calc Af Amer: 60 mL/min — ABNORMAL LOW (ref >90)
Glucose, Bld: 103 mg/dL — ABNORMAL HIGH (ref 70–99)
Potassium: 4.3 mEq/L (ref 3.5–5.1)

## 2012-08-01 MED ORDER — WHITE PETROLATUM GEL
Status: AC
  Administered 2012-08-01: 0.2
  Filled 2012-08-01: qty 5

## 2012-08-01 MED ORDER — ACETYLCYSTEINE 10 % IN SOLN
4.0000 mL | Freq: Three times a day (TID) | RESPIRATORY_TRACT | Status: AC
  Administered 2012-08-01 (×2): 4 mL via RESPIRATORY_TRACT
  Filled 2012-08-01 (×3): qty 4

## 2012-08-01 NOTE — Progress Notes (Signed)
[name]            [place] [address]          [phone]     11 Days Post-Op  Procedure(s) (LRB): VIDEO ASSISTED THORACOSCOPY (VATS)/EMPYEMA (Left) VIDEO BRONCHOSCOPY (Bilateral) Subjective: Feels as though breathing a little better  Objective  Telemetry afib/sinus tachy  Temp:  [97.9 F (36.6 C)-98.2 F (36.8 C)] 98.1 F (36.7 C) (12/30 0400) Pulse Rate:  [96] 96  (12/30 0400) Resp:  [18-20] 19  (12/30 0400) BP: (96-113)/(50-62) 113/50 mmHg (12/30 0400) SpO2:  [96 %-100 %] 97 % (12/30 0400)   Intake/Output Summary (Last 24 hours) at 08/01/12 0748 Last data filed at 08/01/12 0500  Gross per 24 hour  Intake    220 ml  Output   1050 ml  Net   -830 ml       General appearance: alert, cooperative and no distress Heart: regular rate and rhythm and tachy Lungs: dim in bases Abdomen: soft, nontender Extremities: marked edema Wound: stable/ healing  Lab Results:  Basename 08/01/12 0500 07/31/12 0425  NA 135 138  K 4.3 4.1  CL 99 102  CO2 32 32  GLUCOSE 103* 109*  BUN 45* 48*  CREATININE 1.29 1.44*  CALCIUM 7.9* 8.0*  MG -- --  PHOS -- --   No results found for this basename: AST:2,ALT:2,ALKPHOS:2,BILITOT:2,PROT:2,ALBUMIN:2 in the last 72 hours No results found for this basename: LIPASE:2,AMYLASE:2 in the last 72 hours  Basename 07/30/12 0410  WBC 9.0  NEUTROABS --  HGB 10.4*  HCT 30.6*  MCV 86.7  PLT 233   No results found for this basename: CKTOTAL:4,CKMB:4,TROPONINI:4 in the last 72 hours No components found with this basename: POCBNP:3 No results found for this basename: DDIMER in the last 72 hours No results found for this basename: HGBA1C in the last 72 hours No results found for this basename: CHOL,HDL,LDLCALC,TRIG,CHOLHDL in the last 72 hours No results found for this basename: TSH,T4TOTAL,FREET3,T3FREE,THYROIDAB in the last 72 hours No results found for this basename:  VITAMINB12,FOLATE,FERRITIN,TIBC,IRON,RETICCTPCT in the last 72 hours  Medications: Scheduled    . antiseptic oral rinse  15 mL Mouth Rinse QID  . chlorhexidine  15 mL Mouth Rinse BID  . digoxin  0.125 mg Oral Daily  . enoxaparin (LOVENOX) injection  40 mg Subcutaneous Q24H  . feeding supplement  237 mL Oral TID BM  . feeding supplement  1 Container Oral TID WC  . furosemide  40 mg Oral Daily  . guaiFENesin  1,200 mg Oral BID  . ipratropium  0.5 mg Nebulization Q6H  . lactose free nutrition  237 mL Oral TID WC  . levalbuterol  0.63 mg Nebulization Q6H  . minocycline  100 mg Oral Q12H  . pantoprazole  40 mg Oral q morning - 10a  . potassium chloride  40 mEq Oral Daily  . sodium chloride  10-40 mL Intracatheter Q12H  . Tamsulosin HCl  0.4 mg Oral BID     Radiology/Studies:  Dg Chest Port 1 View  07/31/2012  *RADIOLOGY REPORT*  Clinical Data: Status post decortication  PORTABLE CHEST - 1 VIEW  Comparison: 07/30/2012  Findings: Previous median sternotomy CABG procedure.  There is a right arm PICC line with tip in the cavoatrial junction.  Similar appearance of cardiac enlargement.  Interval increase in volume of bilateral pleural effusions, left greater than right.  Airspace  consolidation within the left upper lobe and left midlung has increased in the interval.  IMPRESSION:  1.  Increase in pleural effusions 2.  Worsening aeration to the left lung.   Original Report Authenticated By: Signa Kell, M.D.     INR: Will add last result for INR, ABG once components are confirmed Will add last 4 CBG results once components are confirmed  Assessment/Plan: S/P Procedure(s) (LRB): VIDEO ASSISTED THORACOSCOPY (VATS)/EMPYEMA (Left) VIDEO BRONCHOSCOPY (Bilateral)  1. CXR fairly stable but will get CT scan to better delineate pathology 2 add mucomyst for 24 hours 3 cont other rx/tx 4 creat improved, BUN stable  LOS: 13 days    Christian [name] 12/30/20137:48 AM

## 2012-08-01 NOTE — Progress Notes (Signed)
NUTRITION FOLLOW UP  Intervention:   Continue supplements as ordered:  Ensure Complete or Boost Plus PO TID to maximize oral intake.  Nutrition Dx:   Inadequate oral intake related to decreased appetite as evidenced by poor meal intake, ongoing.  Goal:   Intake to meet >90% of estimated nutrition needs, unmet.  Monitor:   PO intake, weight trend, labs.  Assessment:   PO intake remains poor, consuming ~25% of meals.  Also drinking Ensure Complete and/or Boost Plus supplements 2-3 times daily.  Height: Ht Readings from Last 1 Encounters:  07/21/12 6' (1.829 m)    Weight Status:   Wt Readings from Last 1 Encounters:  07/27/12 195 lb 12.3 oz (88.8 kg)  Down slightly from admission weight; no new weight available.  Re-estimated needs:  Kcal: 2075-2490 kcal/day  Protein: 99-116 gr/day  Fluid: 1 ml/kcal  Skin: extravasation with open areas on buttocks  Diet Order: Cardiac; Ensure Complete PO TID, Boost Plus PO TID (alternating)   Intake/Output Summary (Last 24 hours) at 08/01/12 1218 Last data filed at 08/01/12 1050  Gross per 24 hour  Intake    280 ml  Output   1050 ml  Net   -770 ml    Last BM: 12/30   Labs:   Lab 08/01/12 0500 07/31/12 0425 07/30/12 0410 07/26/12 0435  NA 135 138 135 --  K 4.3 4.1 4.1 --  CL 99 102 99 --  CO2 32 32 31 --  BUN 45* 48* 48* --  CREATININE 1.29 1.44* 1.45* --  CALCIUM 7.9* 8.0* 8.2* --  MG -- -- -- 1.9  PHOS -- -- -- 2.6  GLUCOSE 103* 109* 103* --    CBG (last 3)  No results found for this basename: GLUCAP:3 in the last 72 hours  Scheduled Meds:   . acetylcysteine  4 mL Nebulization TID  . antiseptic oral rinse  15 mL Mouth Rinse QID  . chlorhexidine  15 mL Mouth Rinse BID  . digoxin  0.125 mg Oral Daily  . enoxaparin (LOVENOX) injection  40 mg Subcutaneous Q24H  . feeding supplement  237 mL Oral TID BM  . feeding supplement  1 Container Oral TID WC  . furosemide  40 mg Oral Daily  . guaiFENesin  1,200 mg Oral BID    . ipratropium  0.5 mg Nebulization Q6H  . lactose free nutrition  237 mL Oral TID WC  . levalbuterol  0.63 mg Nebulization Q6H  . minocycline  100 mg Oral Q12H  . pantoprazole  40 mg Oral q morning - 10a  . potassium chloride  40 mEq Oral Daily  . sodium chloride  10-40 mL Intracatheter Q12H  . Tamsulosin HCl  0.4 mg Oral BID    Continuous Infusions:   . sodium chloride 20 mL/hr at 08/01/12 1100    Joaquin Courts, [place], LDN, CNSC Pager# [phone] After Hours Pager# [phone]

## 2012-08-01 NOTE — Progress Notes (Signed)
Ct Chest reviewed.  Majority of opacity left base is the heart shadow. There is still some atelectasis and consolidation of the LLL, but it is much improved. He has an enlarging right effusion- will see if we can get that tapped tomorrow.

## 2012-08-01 NOTE — Progress Notes (Signed)
PT Cancellation Note  Patient Details Name: Christian [name] MRN: [id number] DOB: 1932-07-30   Cancelled Treatment:     Patient going to CT at this time. Will attempt later as time allows!  Thanks 08/01/2012 Fredrich Birks PTA [phone] pager [phone] office      Fredrich Birks 08/01/2012, 8:48 AM

## 2012-08-01 NOTE — Progress Notes (Signed)
Resp Care Note; Pt refusing chest vest. 

## 2012-08-01 NOTE — Progress Notes (Signed)
Resp Care Note; Pt refusing chest vest,states he doesn't feel up to it.

## 2012-08-01 NOTE — Progress Notes (Signed)
Down to CT scan via wheelchair

## 2012-08-01 NOTE — Progress Notes (Signed)
PULMONARY  / CRITICAL CARE MEDICINE  Name: Christian [name] MRN: [id number] DOB: 19-Jan-1932    LOS: 13  REFERRING MD :  Triad PCP: Kriste Basque CHIEF COMPLAINT:  Loculated left pleural effusion/empyema    BRIEF PATIENT DESCRIPTION:  78 yo male, PCP of [name], seen in office 12-17 with increased effusion. He was discharged from [facility] 11/25 to 12-10 to [facility] after treatment for MRSA necrotizing pna.  VATS/decortication 12/19 > empyema  LINES / TUBES: R IJ CVL 12/19 >> 12/20  ETT 12/19 >> 12/20; extubated  PICC 12/20 >>   CULTURES: 12/18 MRSA PCR >> POS 12/19 pleural fluid >> NEG 12/19 Bronch washings - STENOTROPHOMONAS (resistant to bactrim, intermediate to Ceftaz and Levoflox, sens to Minocyclin)   ANTIBIOTICS: Levaquin12/18 >> 12/20 vanc 12/18 >>12/23 Cefepime 12/18 >>12/23 TYGACIL 12/23 (ID consult) >> 12/27 not tolerating due to nausea and vomiting  Minocycline 12/27>>    SIGNIFICANT EVENTS:  12/20 VATS decortication. Findings c/w empyema 07/24/12: Adequate sats , no increased work of breathing  07/25/12: No complaints. Feels gasseous in abdomen and a bit tired but improving.    SUBJECTIVE/OVERNIGHT/INTERVAL HX Leaving room to ambulate w/ assistance. States he feels better, less cough remains productive.   VITAL SIGNS: Temp:  [97.6 F (36.4 C)-98.2 F (36.8 C)] 97.7 F (36.5 C) (12/30 1200) Pulse Rate:  [96-108] 108  (12/30 1050) Resp:  [18-20] 19  (12/30 0400) BP: (99-113)/(50-62) 113/50 mmHg (12/30 0400) SpO2:  [97 %-100 %] 99 % (12/30 0756) 2 liters  HEMODYNAMICS:   VENTILATOR SETTINGS:   INTAKE / OUTPUT: Intake/Output      12/29 0701 - 12/30 0700 12/30 0701 - 12/31 0700   I.V. (mL/kg) 260 (2.9) 20 (0.2)   Total Intake(mL/kg) 260 (2.9) 20 (0.2)   Urine (mL/kg/hr) 1050 (0.5)    Stool 0    Total Output 1050    Net -790 +20        Stool Occurrence 1 x      PHYSICAL EXAMINATION: General: Deconditioned. Frail, NAD, Neuro: no focal  deficits HEENT:  WNL Cardiovascular: sinus with PACs, no M Lungs: Dull L>R base, Sero-sanguinous fluid in tube Abdomen:   Musculoskeletal:  Intact Skin:  Warm + le edema (chronic)   LABS: Cbc  Lab 07/30/12 0410 07/28/12 0410 07/26/12 0435  WBC 9.0 -- --  HGB 10.4* 10.0* 10.0*  HCT 30.6* 28.8* 29.8*  PLT 233 207 213    Chemistry   Lab 08/01/12 0500 07/31/12 0425 07/30/12 0410 07/26/12 0435  NA 135 138 135 --  K 4.3 4.1 4.1 --  CL 99 102 99 --  CO2 32 32 31 --  BUN 45* 48* 48* --  CREATININE 1.29 1.44* 1.45* --  CALCIUM 7.9* 8.0* 8.2* --  MG -- -- -- 1.9  PHOS -- -- -- 2.6  GLUCOSE 103* 109* 103* --    Liver fxn  Lab 07/28/12 1550  AST 15  ALT 13  ALKPHOS 109  BILITOT 0.6  PROT 4.4*  ALBUMIN 1.7*   coags No results found for this basename: APTT:3,INR:3 in the last 168 hours Sepsis markers No results found for this basename: LATICACIDVEN:3,PROCALCITON:3 in the last 168 hours Cardiac markers No results found for this basename: CKTOTAL:3,CKMB:3,TROPONINI:3 in the last 168 hours BNP  Lab 07/27/12 0500  PROBNP 3851.0*   ABG No results found for this basename: PHART:3,PCO2ART:3,PO2ART:3,HCO3:3,TCO2:3 in the last 168 hours  CBG trend  Lab 07/29/12 1125 07/29/12 0541 07/28/12 2332 07/28/12 1734 07/28/12 1239  GLUCAP 128* 96 117* 102* 103*   Ct Chest Wo Contrast  08/01/2012  *RADIOLOGY REPORT*  Clinical Data: Left base opacity, question atelectasis or effusion.  CT CHEST WITHOUT CONTRAST  Technique:  Multidetector CT imaging of the chest was performed following the standard protocol without IV contrast.  Comparison: Chest x-ray 08/01/2012.  Chest CT 07/21/2012.  Findings: Moderate to large right pleural effusion, slightly increased since prior CT.  Small left pleural effusion which has decreased significantly since prior study.  This appears at least partially loculated superiorly.  Left lung airspace disease throughout both the upper and lower lobe, more  confluent in the lower lobe. This has improved since prior CT from 07/02/2012. There is a small locule of air noted on series 3 image 31 posterior medially in the left lower hemithorax.  I cannot exclude this represents pleural air and a small loculated hydropneumothorax Compressive atelectasis in the right lower lobe.  Changes of prior median sternotomy.  Extensive calcifications present, including substernal which extends along the anterior aspect of the proximal pulmonary artery and pulmonary outflow tract.  There also extensive left pleural calcifications anteriorly and pericardial calcifications as well.  Small pericardial effusion present.  No morphologic features noted at this time to suggest constrictive physiology.  No mediastinal, hilar or axillary adenopathy. Visualized thyroid and chest wall soft tissues unremarkable.  There is a wedge shaped low density area anteriorly within the spleen.  When comparing to prior study, this was likely present on the 07/21/2012, not present on 07/02/2012.  This is much better defined and visualized today's study and is concerning for splenic infarct.  IMPRESSION: Decreasing left pleural effusion and left airspace disease since prior study.  Small partially loculated left effusion persist with patchy airspace disease in both the left upper and left lower lobes.  There is suggestion of a small loculated hydropneumothorax in the posterior medial lower left hemithorax.  Large right pleural effusion, increased since prior study. Compressive atelectasis in the right lower lobe.  Extensive mediastinal, anterior left pleural and pericardial calcifications.  Small pericardial effusion.  No morphologic features to suggest constrictive physiology.  Wedge shaped low density area within the anterior spleen, which was likely present on prior study from 07/21/2012 but is better visualized and defined on today's study concerning for splenic infarct.   Original Report Authenticated By:  Charlett Nose, M.D.    Dg Chest Port 1 View  08/01/2012  *RADIOLOGY REPORT*  Clinical Data: Follow-up atelectasis.  PORTABLE CHEST - 1 VIEW  Comparison: 07/31/2012 and 07/22/2012.  Findings: 0625 hours.  Right arm PICC appears unchanged at the level of the SVC right atrial junction.  Heart size and mediastinal contours are stable.  There has been further slight worsening in the bilateral pleural effusions and associated bibasilar pulmonary opacities.  There is possible mild edema.  No pneumothorax is evident.  IMPRESSION: Worsening pleural effusions and bibasilar aeration.  Possible mild edema.   Original Report Authenticated By: Carey Bullocks, M.D.    Dg Chest Port 1 View  07/31/2012  *RADIOLOGY REPORT*  Clinical Data: Status post decortication  PORTABLE CHEST - 1 VIEW  Comparison: 07/30/2012  Findings: Previous median sternotomy CABG procedure.  There is a right arm PICC line with tip in the cavoatrial junction.  Similar appearance of cardiac enlargement.  Interval increase in volume of bilateral pleural effusions, left greater than right.  Airspace consolidation within the left upper lobe and left midlung has increased in the interval.  IMPRESSION:  1.  Increase  in pleural effusions 2.  Worsening aeration to the left lung.   Original Report Authenticated By: Signa Kell, M.D.   Imaging reviewed-     DIAGNOSES: Principal Problem:  *Acute postprocedural respiratory failure Active Problems:  ESSENTIAL HYPERTENSION  COPD (chronic obstructive pulmonary disease)  CAD (coronary artery disease)  Necrotizing pneumonia, community acquired MRSA  Dysphagia, pharyngeal  Pleural effusion  Atrial fibrillation  Pedal edema  Empyema of left pleural space  Atelectasis of left lung  CT #1 removed 12/26,  Feeling better clinically. Marland Kitchen  PLAN: - Chest tube  mgmt per TCTS - no air leak. - Tolerating po minocycline with no fever, WBC 9K, nausea resolving off tygacil, continue aggressive pulmonary  toilet. - LFTs and lipase WNL.  - With evidence of worsening disease, we will consider iv minocycline. Minocycline  should also cover MRSA.  Antibiotics 6/14, Minocycline day 4.  - Cont chest percussion flutter /IS , ezpap and ambulation. - Digoxin started; Digoxin level pending. - PT - DVT prophylaxis on Lovenox  - CT scan to be done today, will f/u in AM.  Alyson Reedy, M.D. [facility] Pulmonary/Critical Care Medicine. Pager: [phone]. After hours pager: [phone].

## 2012-08-01 NOTE — Progress Notes (Signed)
Resp Care Note; Pt refusing Chest vest. 

## 2012-08-02 ENCOUNTER — Inpatient Hospital Stay (HOSPITAL_COMMUNITY): Payer: Medicare Other

## 2012-08-02 ENCOUNTER — Other Ambulatory Visit (HOSPITAL_COMMUNITY): Payer: Medicare Other

## 2012-08-02 DIAGNOSIS — J690 Pneumonitis due to inhalation of food and vomit: Secondary | ICD-10-CM

## 2012-08-02 MED ORDER — AMIODARONE HCL 200 MG PO TABS
400.0000 mg | ORAL_TABLET | Freq: Two times a day (BID) | ORAL | Status: DC
  Administered 2012-08-02 – 2012-08-09 (×15): 400 mg via ORAL
  Filled 2012-08-02 (×18): qty 2

## 2012-08-02 MED ORDER — FUROSEMIDE 40 MG PO TABS
40.0000 mg | ORAL_TABLET | Freq: Two times a day (BID) | ORAL | Status: DC
  Administered 2012-08-02 – 2012-08-09 (×14): 40 mg via ORAL
  Filled 2012-08-02 (×17): qty 1

## 2012-08-02 NOTE — Progress Notes (Signed)
Physical Therapy Treatment Patient Details Name: Christian [name] MRN: [id number] DOB: 1931/10/15 Today's Date: 08/02/2012 Time: 7829-5621 PT Time Calculation (min): 24 min  PT Assessment / Plan / Recommendation Comments on Treatment Session  Pt continues to show overall improvement.  Pt Sa02 with slight initially decrease to 88% on 2L with ambulation however able to progress to >90% on 2L with slow deep breathing.      Follow Up Recommendations  SNF     Does the patient have the potential to tolerate intense rehabilitation     Barriers to Discharge        Equipment Recommendations  None recommended by PT    Recommendations for Other Services    Frequency Min 3X/week   Plan Discharge plan remains appropriate    Precautions / Restrictions Precautions Precautions: Fall Restrictions Weight Bearing Restrictions: No   Pertinent Vitals/Pain No c/o pain; c/o SOB 3/4 on Dyspnea scale     Mobility  Bed Mobility Bed Mobility: Not assessed Details for Bed Mobility Assistance: Pt OOB in recliner Transfers Transfers: Sit to Stand;Stand to Sit Sit to Stand: 4: Min guard;With armrests;From chair/3-in-1 Stand to Sit: 4: Min guard;With armrests;To chair/3-in-1 Details for Transfer Assistance: Min guard for safety. Increased time to complete transfer. Ambulation/Gait Ambulation/Gait Assistance: 4: Min guard Ambulation Distance (Feet): 200 Feet Assistive device: Other (Comment) (Pt pushed wheelchair to hold equipment) Ambulation/Gait Assistance Details: Pt needed intermittent standing rest breaks due to increase SOB.   Gait Pattern: Shuffle;Trunk flexed;Step-through pattern Stairs: No Wheelchair Mobility Wheelchair Mobility: No    Exercises     PT Diagnosis:    PT Problem List:   PT Treatment Interventions:     PT Goals Acute Rehab PT Goals PT Goal Formulation: With patient/family Time For Goal Achievement: 08/25/2012 Potential to Achieve Goals: Good Pt will go Supine/Side to  Sit: with modified independence PT Goal: Supine/Side to Sit - Progress: Progressing toward goal Pt will go Sit to Supine/Side: with modified independence PT Goal: Sit to Supine/Side - Progress: Progressing toward goal Pt will go Sit to Stand: with modified independence PT Goal: Sit to Stand - Progress: Progressing toward goal Pt will Ambulate: >150 feet;with modified independence;with least restrictive assistive device PT Goal: Ambulate - Progress: Progressing toward goal  Visit Information  Last PT Received On: 08/02/12 Assistance Needed: +1 (2 helpful for lines/O2) PT/OT Co-Evaluation/Treatment: Yes    Subjective Data  Subjective: "I'm suppose to have a procedure today to take fluid off my lungs." Patient Stated Goal: rehab   Cognition  Overall Cognitive Status: Appears within functional limits for tasks assessed/performed Arousal/Alertness: Awake/alert Orientation Level: Appears intact for tasks assessed Behavior During Session: [facility] for tasks performed    Balance  Balance Balance Assessed: Yes Static Standing Balance Static Standing - Balance Support: During functional activity Static Standing - Level of Assistance: 5: Stand by assistance Static Standing - Comment/# of Minutes: ~2 minutes to perform hand hygiene   End of Session PT - End of Session Equipment Utilized During Treatment: Gait belt Activity Tolerance: Patient tolerated treatment well Patient left: in chair;with call bell/phone within reach Nurse Communication: Mobility status   GP     [name] 08/02/2012, 12:06 PM Jake Shark, PT DPT [phone]

## 2012-08-02 NOTE — Progress Notes (Signed)
PULMONARY  / CRITICAL CARE MEDICINE  Name: Christian [name] MRN: [id number] DOB: 1932/05/10    LOS: 14  REFERRING MD :  [facility] PCP: Kriste Basque CHIEF COMPLAINT:  Loculated left pleural effusion/empyema    BRIEF PATIENT DESCRIPTION:  78 yo male, PCP of Christian [name], seen in office 12-17 with increased effusion. He was discharged from [facility] 11/25 to 12-10 to [facility] after treatment for MRSA necrotizing pna.  VATS/decortication 12/19 > empyema  LINES / TUBES: R IJ CVL 12/19 >> 12/20  ETT 12/19 >> 12/20; extubated  PICC 12/20 >>   CULTURES: 12/18 MRSA PCR >> POS 12/19 pleural fluid >> NEG 12/19 Bronch washings - STENOTROPHOMONAS (resistant to bactrim, intermediate to Ceftaz and Levoflox, sens to Minocyclin)   ANTIBIOTICS: Levaquin12/18 >> 12/20 vanc 12/18 >>12/23 Cefepime 12/18 >>12/23 TYGACIL 12/23 (ID consult) >> 12/27 not tolerating due to nausea and vomiting  Minocycline 12/27>>    SIGNIFICANT EVENTS:  12/20 VATS decortication. Findings c/w empyema 07/24/12: Adequate sats , no increased work of breathing  07/25/12: No complaints. Feels gasseous in abdomen and a bit tired but improving.    SUBJECTIVE/OVERNIGHT/INTERVAL HX Leaving room to ambulate w/ assistance. States he feels better, less cough remains productive.   VITAL SIGNS: Temp:  [97.6 F (36.4 C)-98.5 F (36.9 C)] 97.7 F (36.5 C) (12/31 1127) Pulse Rate:  [105-108] 107  (12/31 0400) Resp:  [21-27] 27  (12/31 0400) BP: (100-121)/(45-80) 108/80 mmHg (12/31 0802) SpO2:  [91 %-100 %] 96 % (12/31 0400) FiO2 (%):  [97 %] 97 % (12/31 0914) 2 liters  HEMODYNAMICS:   VENTILATOR SETTINGS: Vent Mode:  [-]  FiO2 (%):  [97 %] 97 % INTAKE / OUTPUT: Intake/Output      12/30 0701 - 12/31 0700 12/31 0701 - 01/01 0700   I.V. (mL/kg) 498.3 (5.6)    IV Piggyback 2    Total Intake(mL/kg) 500.3 (5.6)    Urine (mL/kg/hr) 1025 (0.5) 50 (0.1)   Total Output 1025 50   Net -524.7 -50        Stool Occurrence 2 x      PHYSICAL EXAMINATION: General: Deconditioned. Frail, NAD, Neuro: no focal deficits HEENT:  WNL Cardiovascular: sinus with PACs, no M Lungs: Dull L>R base, Sero-sanguinous fluid in tube Abdomen:   Musculoskeletal:  Intact Skin:  Warm + le edema (chronic)   LABS: Cbc  Lab 07/30/12 0410 07/28/12 0410  WBC 9.0 --  HGB 10.4* 10.0*  HCT 30.6* 28.8*  PLT 233 207    Chemistry   Lab 08/01/12 0500 07/31/12 0425 07/30/12 0410  NA 135 138 135  K 4.3 4.1 4.1  CL 99 102 99  CO2 32 32 31  BUN 45* 48* 48*  CREATININE 1.29 1.44* 1.45*  CALCIUM 7.9* 8.0* 8.2*  MG -- -- --  PHOS -- -- --  GLUCOSE 103* 109* 103*    Liver fxn  Lab 07/28/12 1550  AST 15  ALT 13  ALKPHOS 109  BILITOT 0.6  PROT 4.4*  ALBUMIN 1.7*   coags No results found for this basename: APTT:3,INR:3 in the last 168 hours Sepsis markers No results found for this basename: LATICACIDVEN:3,PROCALCITON:3 in the last 168 hours Cardiac markers No results found for this basename: CKTOTAL:3,CKMB:3,TROPONINI:3 in the last 168 hours BNP  Lab 07/27/12 0500  PROBNP 3851.0*   ABG No results found for this basename: PHART:3,PCO2ART:3,PO2ART:3,HCO3:3,TCO2:3 in the last 168 hours  CBG trend  Lab 07/29/12 1125 07/29/12 0541 07/28/12 2332 07/28/12 1734 07/28/12 1239  GLUCAP 128* 96 117* 102* 103*   Ct Chest Wo Contrast  08/01/2012  *RADIOLOGY REPORT*  Clinical Data: Left base opacity, question atelectasis or effusion.  CT CHEST WITHOUT CONTRAST  Technique:  Multidetector CT imaging of the chest was performed following the standard protocol without IV contrast.  Comparison: Chest x-ray 08/01/2012.  Chest CT 07/21/2012.  Findings: Moderate to large right pleural effusion, slightly increased since prior CT.  Small left pleural effusion which has decreased significantly since prior study.  This appears at least partially loculated superiorly.  Left lung airspace disease throughout both the upper and lower lobe, more  confluent in the lower lobe. This has improved since prior CT from 07/02/2012. There is a small locule of air noted on series 3 image 31 posterior medially in the left lower hemithorax.  I cannot exclude this represents pleural air and a small loculated hydropneumothorax Compressive atelectasis in the right lower lobe.  Changes of prior median sternotomy.  Extensive calcifications present, including substernal which extends along the anterior aspect of the proximal pulmonary artery and pulmonary outflow tract.  There also extensive left pleural calcifications anteriorly and pericardial calcifications as well.  Small pericardial effusion present.  No morphologic features noted at this time to suggest constrictive physiology.  No mediastinal, hilar or axillary adenopathy. Visualized thyroid and chest wall soft tissues unremarkable.  There is a wedge shaped low density area anteriorly within the spleen.  When comparing to prior study, this was likely present on the 07/21/2012, not present on 07/02/2012.  This is much better defined and visualized today's study and is concerning for splenic infarct.  IMPRESSION: Decreasing left pleural effusion and left airspace disease since prior study.  Small partially loculated left effusion persist with patchy airspace disease in both the left upper and left lower lobes.  There is suggestion of a small loculated hydropneumothorax in the posterior medial lower left hemithorax.  Large right pleural effusion, increased since prior study. Compressive atelectasis in the right lower lobe.  Extensive mediastinal, anterior left pleural and pericardial calcifications.  Small pericardial effusion.  No morphologic features to suggest constrictive physiology.  Wedge shaped low density area within the anterior spleen, which was likely present on prior study from 07/21/2012 but is better visualized and defined on today's study concerning for splenic infarct.   Original Report Authenticated By:  Charlett Nose, M.D.    Dg Chest Port 1 View  08/01/2012  *RADIOLOGY REPORT*  Clinical Data: Follow-up atelectasis.  PORTABLE CHEST - 1 VIEW  Comparison: 07/31/2012 and 07/22/2012.  Findings: 0625 hours.  Right arm PICC appears unchanged at the level of the SVC right atrial junction.  Heart size and mediastinal contours are stable.  There has been further slight worsening in the bilateral pleural effusions and associated bibasilar pulmonary opacities.  There is possible mild edema.  No pneumothorax is evident.  IMPRESSION: Worsening pleural effusions and bibasilar aeration.  Possible mild edema.   Original Report Authenticated By: Carey Bullocks, M.D.   Imaging reviewed-     DIAGNOSES: Principal Problem:  *Acute postprocedural respiratory failure Active Problems:  ESSENTIAL HYPERTENSION  COPD (chronic obstructive pulmonary disease)  CAD (coronary artery disease)  Necrotizing pneumonia, community acquired MRSA  Dysphagia, pharyngeal  Pleural effusion  Atrial fibrillation  Pedal edema  Empyema of left pleural space  Atelectasis of left lung  CT #1 removed 12/26,  Feeling better clinically. Marland Kitchen  PLAN: - Chest tube  mgmt per TCTS - no air leak. - Tolerating po minocycline with no  fever, WBC 9K, nausea resolving off tygacil, continue aggressive pulmonary toilet.  Abx day 7/14 minocycline day 5, will continue PO as the patient's WBC is improving and no fever.  - CBC in AM. - LFTs and lipase WNL.  - Cont chest percussion flutter /IS , ezpap and ambulation. - Digoxin started; Digoxin level 2.0. - PT. - DVT prophylaxis on Lovenox. - CT scan of the chest decrease left pleural effusion, loculated left effusion, otherwise per CVTS. - Will continue to follow for abx as above.  Alyson Reedy, M.D. [facility] Pulmonary/Critical Care Medicine. Pager: [phone]. After hours pager: [phone].

## 2012-08-02 NOTE — Progress Notes (Signed)
Occupational Therapy Treatment Patient Details Name: Christian [name] MRN: [id number] DOB: 1931-11-04 Today's Date: 08/02/2012 Time: 4098-1191 OT Time Calculation (min): 24 min  OT Assessment / Plan / Recommendation Comments on Treatment Session Pt progressing with therapy.     Follow Up Recommendations  SNF    Barriers to Discharge       Equipment Recommendations  None recommended by OT    Recommendations for Other Services    Frequency Min 2X/week   Plan Discharge plan remains appropriate    Precautions / Restrictions Precautions Precautions: Fall Restrictions Weight Bearing Restrictions: No   Pertinent Vitals/Pain Pt reports pain on buttocks- barrier cream applied    ADL  Grooming: Wash/dry hands;Min guard Where Assessed - Grooming: Supported standing (UE support on sink) Lower Body Bathing: Moderate assistance Where Assessed - Lower Body Bathing: Unsupported sit to stand Toilet Transfer: Min Pension scheme manager Method: Sit to Barista: Bedside commode Equipment Used: Gait belt;Wheelchair Transfers/Ambulation Related to ADLs: min guard during ambulation while pushing w/c. Pt ambulated around unit with standing rest breaks x2. ADL Comments: Pt with multiple red areas with skin not intact on bottom. Pt requesting cream be put on. Provided Mod assist to perform pericare (bottom) and apply barrier cream.     OT Diagnosis:    OT Problem List:   OT Treatment Interventions:     OT Goals Acute Rehab OT Goals Time For Goal Achievement: 09/01/2012 ADL Goals Pt Will Perform Grooming: with supervision;Standing at sink ADL Goal: Grooming - Progress: Progressing toward goals Pt Will Transfer to Toilet: with supervision;Ambulation;Regular height toilet ADL Goal: Toilet Transfer - Progress: Progressing toward goals Pt Will Perform Toileting - Clothing Manipulation: with supervision;Sitting on 3-in-1 or toilet;Standing ADL Goal: Toileting - Clothing  Manipulation - Progress: Progressing toward goals Pt Will Perform Toileting - Hygiene: with supervision;Sit to stand from 3-in-1/toilet ADL Goal: Toileting - Hygiene - Progress: Progressing toward goals Pt Will Perform Tub/Shower Transfer: Shower transfer;with supervision;Ambulation Additional ADL Goal #1: Pt will perform all aspects of bathing/dressing ADLs at supervision level while independently initiating rest breaks as needed. ADL Goal: Additional Goal #1 - Progress: Progressing toward goals Miscellaneous OT Goals Miscellaneous OT Goal #1: Pt will perform all functoinal mobility at supervision level during ADLs. OT Goal: Miscellaneous Goal #1 - Progress: Progressing toward goals  Visit Information  Last OT Received On: 08/02/12 Assistance Needed: +1 (2 helpful for lines/O2)    Subjective Data      Prior Functioning       Cognition  Overall Cognitive Status: Appears within functional limits for tasks assessed/performed Arousal/Alertness: Awake/alert Orientation Level: Appears intact for tasks assessed Behavior During Session: [facility] for tasks performed    Mobility  Shoulder Instructions Transfers Sit to Stand: 4: Min guard;With armrests;From chair/3-in-1 Stand to Sit: 4: Min guard;With armrests;To chair/3-in-1 Details for Transfer Assistance: Min guard for safety. Increased time to complete transfer.       Exercises      Balance     End of Session OT - End of Session Equipment Utilized During Treatment: Gait belt Activity Tolerance: Patient tolerated treatment well Patient left: in chair;with call bell/phone within reach Nurse Communication: Mobility status  GO     [name] 08/02/2012, 9:33 AM

## 2012-08-02 NOTE — Progress Notes (Addendum)
[name]            [place] [address]          [phone]     12 Days Post-Op  Procedure(s) (LRB): VIDEO ASSISTED THORACOSCOPY (VATS)/EMPYEMA (Left) VIDEO BRONCHOSCOPY (Bilateral) Subjective  Feels ok, some acid reflux sx  Objective  Telemetry afib with RVR at times  Temp:  [97.6 F (36.4 C)-98.5 F (36.9 C)] 97.8 F (36.6 C) (12/31 0802) Pulse Rate:  [105-108] 107  (12/31 0400) Resp:  [21-27] 27  (12/31 0400) BP: (100-121)/(45-80) 108/80 mmHg (12/31 0802) SpO2:  [91 %-100 %] 96 % (12/31 0400)   Intake/Output Summary (Last 24 hours) at 08/02/12 0818 Last data filed at 08/02/12 0803  Gross per 24 hour  Intake 500.33 ml  Output   1075 ml  Net -574.67 ml       General appearance: alert, cooperative, fatigued and no distress Heart: irregularly irregular rhythm Lungs: coarse BS, dim bilat lower fields Abdomen: mild distension, soft, nontender Extremities: + edema Wound: incis ok  Lab Results:  Basename 08/01/12 0500 07/31/12 0425  NA 135 138  K 4.3 4.1  CL 99 102  CO2 32 32  GLUCOSE 103* 109*  BUN 45* 48*  CREATININE 1.29 1.44*  CALCIUM 7.9* 8.0*  MG -- --  PHOS -- --   No results found for this basename: AST:2,ALT:2,ALKPHOS:2,BILITOT:2,PROT:2,ALBUMIN:2 in the last 72 hours No results found for this basename: LIPASE:2,AMYLASE:2 in the last 72 hours No results found for this basename: WBC:2,NEUTROABS:2,HGB:2,HCT:2,MCV:2,PLT:2 in the last 72 hours No results found for this basename: CKTOTAL:4,CKMB:4,TROPONINI:4 in the last 72 hours No components found with this basename: POCBNP:3 No results found for this basename: DDIMER in the last 72 hours No results found for this basename: HGBA1C in the last 72 hours No results found for this basename: CHOL,HDL,LDLCALC,TRIG,CHOLHDL in the last 72 hours No results found for this basename: TSH,T4TOTAL,FREET3,T3FREE,THYROIDAB in the last 72 hours No results found for this basename:  VITAMINB12,FOLATE,FERRITIN,TIBC,IRON,RETICCTPCT in the last 72 hours  Medications: Scheduled    . antiseptic oral rinse  15 mL Mouth Rinse QID  . chlorhexidine  15 mL Mouth Rinse BID  . digoxin  0.125 mg Oral Daily  . enoxaparin (LOVENOX) injection  40 mg Subcutaneous Q24H  . feeding supplement  237 mL Oral TID BM  . feeding supplement  1 Container Oral TID WC  . furosemide  40 mg Oral Daily  . guaiFENesin  1,200 mg Oral BID  . ipratropium  0.5 mg Nebulization Q6H  . lactose free nutrition  237 mL Oral TID WC  . levalbuterol  0.63 mg Nebulization Q6H  . minocycline  100 mg Oral Q12H  . pantoprazole  40 mg Oral q morning - 10a  . potassium chloride  40 mEq Oral Daily  . sodium chloride  10-40 mL Intracatheter Q12H  . Tamsulosin HCl  0.4 mg Oral BID     Radiology/Studies:  Ct Chest Wo Contrast  08/01/2012  *RADIOLOGY REPORT*  Clinical Data: Left base opacity, question atelectasis or effusion.  CT CHEST WITHOUT CONTRAST  Technique:  Multidetector CT imaging of the chest was performed following the standard protocol without IV contrast.  Comparison: Chest x-ray 08/01/2012.  Chest CT 07/21/2012.  Findings: Moderate to large right pleural effusion, slightly increased since prior CT.  Small left pleural effusion which has decreased significantly since prior study.  This  appears at least partially loculated superiorly.  Left lung airspace disease throughout both the upper and lower lobe, more confluent in the lower lobe. This has improved since prior CT from 07/02/2012. There is a small locule of air noted on series 3 image 31 posterior medially in the left lower hemithorax.  I cannot exclude this represents pleural air and a small loculated hydropneumothorax Compressive atelectasis in the right lower lobe.  Changes of prior median sternotomy.  Extensive calcifications present, including substernal which extends along the anterior aspect of the proximal pulmonary artery and pulmonary outflow  tract.  There also extensive left pleural calcifications anteriorly and pericardial calcifications as well.  Small pericardial effusion present.  No morphologic features noted at this time to suggest constrictive physiology.  No mediastinal, hilar or axillary adenopathy. Visualized thyroid and chest wall soft tissues unremarkable.  There is a wedge shaped low density area anteriorly within the spleen.  When comparing to prior study, this was likely present on the 07/21/2012, not present on 07/02/2012.  This is much better defined and visualized today's study and is concerning for splenic infarct.  IMPRESSION: Decreasing left pleural effusion and left airspace disease since prior study.  Small partially loculated left effusion persist with patchy airspace disease in both the left upper and left lower lobes.  There is suggestion of a small loculated hydropneumothorax in the posterior medial lower left hemithorax.  Large right pleural effusion, increased since prior study. Compressive atelectasis in the right lower lobe.  Extensive mediastinal, anterior left pleural and pericardial calcifications.  Small pericardial effusion.  No morphologic features to suggest constrictive physiology.  Wedge shaped low density area within the anterior spleen, which was likely present on prior study from 07/21/2012 but is better visualized and defined on today's study concerning for splenic infarct.   Original Report Authenticated By: Charlett Nose, M.D.    Dg Chest Port 1 View  08/01/2012  *RADIOLOGY REPORT*  Clinical Data: Follow-up atelectasis.  PORTABLE CHEST - 1 VIEW  Comparison: 07/31/2012 and 07/22/2012.  Findings: 0625 hours.  Right arm PICC appears unchanged at the level of the SVC right atrial junction.  Heart size and mediastinal contours are stable.  There has been further slight worsening in the bilateral pleural effusions and associated bibasilar pulmonary opacities.  There is possible mild edema.  No pneumothorax is  evident.  IMPRESSION: Worsening pleural effusions and bibasilar aeration.  Possible mild edema.   Original Report Authenticated By: Carey Bullocks, M.D.     INR: Will add last result for INR, ABG once components are confirmed Will add last 4 CBG results once components are confirmed  Assessment/Plan: S/P Procedure(s) (LRB): VIDEO ASSISTED THORACOSCOPY (VATS)/EMPYEMA (Left) VIDEO BRONCHOSCOPY (Bilateral)  1 for right thoracentesis today 2 cont aggressive pulm rx,, ? change to IV minocycline? 3 on Digoxin, may need additional agent, not an ideal AC RX candidate, on SQlovenox 4 renal fxn improved 5 push nutrition/rehab with PT  LOS: 14 days    Christian [name],Christian [name] 12/31/20138:18 AM   patient seen and examined, agree with above As nausea was due to antibiotic will try restarting amiodarone PO

## 2012-08-02 NOTE — Procedures (Signed)
Successful US guided right thoracentesis. Yielded 1.5L of clear yellow fluid. Pt tolerated procedure well. No immediate complications.  Specimen was sent for labs. CXR ordered.  Brayton El PA-C 08/02/2012 2:05 PM

## 2012-08-03 LAB — CBC
HCT: 27.2 % — ABNORMAL LOW (ref 39.0–52.0)
Hemoglobin: 9 g/dL — ABNORMAL LOW (ref 13.0–17.0)
MCH: 29.3 pg (ref 26.0–34.0)
MCHC: 33.1 g/dL (ref 30.0–36.0)
MCV: 88.6 fL (ref 78.0–100.0)
RBC: 3.07 MIL/uL — ABNORMAL LOW (ref 4.22–5.81)

## 2012-08-03 LAB — BASIC METABOLIC PANEL
BUN: 42 mg/dL — ABNORMAL HIGH (ref 6–23)
CO2: 34 mEq/L — ABNORMAL HIGH (ref 19–32)
Calcium: 8.2 mg/dL — ABNORMAL LOW (ref 8.4–10.5)
GFR calc non Af Amer: 50 mL/min — ABNORMAL LOW (ref >90)
Glucose, Bld: 104 mg/dL — ABNORMAL HIGH (ref 70–99)

## 2012-08-03 LAB — PHOSPHORUS: Phosphorus: 3.3 mg/dL (ref 2.3–4.6)

## 2012-08-03 NOTE — Progress Notes (Addendum)
[name]            [place] [address]          [phone]     13 Days Post-Op  Procedure(s) (LRB): VIDEO ASSISTED THORACOSCOPY (VATS)/EMPYEMA (Left) VIDEO BRONCHOSCOPY (Bilateral) Subjective: Feels much better after thoracentesis  Objective  Telemetry afib  Temp:  [97.4 F (36.3 C)-97.8 F (36.6 C)] 97.6 F (36.4 C) (01/01 0300) Pulse Rate:  [72-100] 100  (01/01 0300) Resp:  [17-20] 18  (01/01 0300) BP: (90-96)/(45-51) 95/50 mmHg (01/01 0805) SpO2:  [97 %-100 %] 100 % (01/01 0805) FiO2 (%):  [97 %] 97 % (12/31 0914)   Intake/Output Summary (Last 24 hours) at 08/03/12 0903 Last data filed at 08/03/12 0700  Gross per 24 hour  Intake    420 ml  Output    875 ml  Net   -455 ml       General appearance: alert, cooperative and no distress Heart: irregularly irregular rhythm Lungs: dim left base Abdomen: soft, nontender Extremities: + edema Wound: incis ok  Lab Results:  Basename 08/03/12 0530 08/01/12 0500  NA 137 135  K 4.4 4.3  CL 100 99  CO2 34* 32  GLUCOSE 104* 103*  BUN 42* 45*  CREATININE 1.31 1.29  CALCIUM 8.2* 7.9*  MG 1.9 --  PHOS 3.3 --   No results found for this basename: AST:2,ALT:2,ALKPHOS:2,BILITOT:2,PROT:2,ALBUMIN:2 in the last 72 hours No results found for this basename: LIPASE:2,AMYLASE:2 in the last 72 hours  Basename 08/03/12 0530  WBC 14.8*  NEUTROABS --  HGB 9.0*  HCT 27.2*  MCV 88.6  PLT 207   No results found for this basename: CKTOTAL:4,CKMB:4,TROPONINI:4 in the last 72 hours No components found with this basename: POCBNP:3 No results found for this basename: DDIMER in the last 72 hours No results found for this basename: HGBA1C in the last 72 hours No results found for this basename: CHOL,HDL,LDLCALC,TRIG,CHOLHDL in the last 72 hours No results found for this basename: TSH,T4TOTAL,FREET3,T3FREE,THYROIDAB in the last 72 hours No results found for this basename:  VITAMINB12,FOLATE,FERRITIN,TIBC,IRON,RETICCTPCT in the last 72 hours  Medications: Scheduled    . amiodarone  400 mg Oral BID  . antiseptic oral rinse  15 mL Mouth Rinse QID  . chlorhexidine  15 mL Mouth Rinse BID  . digoxin  0.125 mg Oral Daily  . enoxaparin (LOVENOX) injection  40 mg Subcutaneous Q24H  . feeding supplement  237 mL Oral TID BM  . feeding supplement  1 Container Oral TID WC  . furosemide  40 mg Oral BID  . guaiFENesin  1,200 mg Oral BID  . ipratropium  0.5 mg Nebulization Q6H  . lactose free nutrition  237 mL Oral TID WC  . levalbuterol  0.63 mg Nebulization Q6H  . minocycline  100 mg Oral Q12H  . pantoprazole  40 mg Oral q morning - 10a  . potassium chloride  40 mEq Oral Daily  . sodium chloride  10-40 mL Intracatheter Q12H  . Tamsulosin HCl  0.4 mg Oral BID     Radiology/Studies:  Dg Chest 1 View  08/02/2012  *RADIOLOGY REPORT*  Clinical Data: Right thoracentesis  CHEST - 1 VIEW  Comparison: CT scan from 08/01/2012.  Chest x-ray from 08/01/2012  Findings: Right pleural effusion has decreased in the interval.  No evidence for pneumothorax.  Left base appears better aerated than on the previous  study with persistent patchy left upper and lower lung opacities.  Left pleural effusion again noted. The cardiopericardial silhouette is enlarged.  Right PICC line persists and the tip it is difficult to discern but appears to be at about the level of the mid SVC.  IMPRESSION: Interval decrease in right pleural effusion without evidence for pneumothorax status post thoracentesis.   Original Report Authenticated By: Kennith Center, M.D.    US Thoracentesis Asp Pleural Space W/img Guide  08/02/2012  *RADIOLOGY REPORT*  Clinical Data:  Recent left-sided empyema status post VATS, new right-sided pleural effusion  ULTRASOUND GUIDED right THORACENTESIS  Comparison:  None  An ultrasound guided thoracentesis was thoroughly discussed with the patient and questions answered.  The  benefits, risks, alternatives and complications were also discussed.  The patient understands and wishes to proceed with the procedure.  Written consent was obtained.  Ultrasound was performed to localize and mark an adequate pocket of fluid in the right chest.  The area was then prepped and draped in the normal sterile fashion.  1% Lidocaine was used for local anesthesia.  Under ultrasound guidance a 19 gauge Yueh catheter was introduced.  Thoracentesis was performed.  The catheter was removed and a dressing applied.  Complications:  None immediate  Findings: A total of approximately 1.5 liters of clear yellow fluid was removed. A fluid sample was sent for laboratory analysis.  IMPRESSION: Successful ultrasound guided right thoracentesis yielding 1.5 liters of pleural fluid.  Read by Brayton El PA-C   Original Report Authenticated By: Irish Lack, M.D.    Dg Chest 1 View  08/02/2012  *RADIOLOGY REPORT*  Clinical Data: Right thoracentesis  CHEST - 1 VIEW  Comparison: CT scan from 08/01/2012.  Chest x-ray from 08/01/2012  Findings: Right pleural effusion has decreased in the interval.  No evidence for pneumothorax.  Left base appears better aerated than on the previous study with persistent patchy left upper and lower lung opacities.  Left pleural effusion again noted. The cardiopericardial silhouette is enlarged.  Right PICC line persists and the tip it is difficult to discern but appears to be at about the level of the mid SVC.  IMPRESSION: Interval decrease in right pleural effusion without evidence for pneumothorax status post thoracentesis.   Original Report Authenticated By: Kennith Center, M.D.      Assessment/Plan: S/P Procedure(s) (LRB): VIDEO ASSISTED THORACOSCOPY (VATS)/EMPYEMA (Left) VIDEO BRONCHOSCOPY (Bilateral)  1 good clinical response to right thoracentesis, monitor for re-accumulation as may need to consider pleurx if returns quickly 2 cont medical management/pulm toilet. No change  in afib management for now(amio restarted yesterday) 3 may be able to return to [facility] (SNF) soon    LOS: 15 days    Christian [name] 1/1/20149:03 AM   I have seen and examined Christian [name] and agree with the above assessment  and plan.  Delight Ovens MD Beeper [phone] Office [phone] 08/03/2012 12:40 PM

## 2012-08-03 NOTE — Progress Notes (Signed)
Resp Care Note,Pt refusing Chest vest.

## 2012-08-04 ENCOUNTER — Inpatient Hospital Stay (HOSPITAL_COMMUNITY): Payer: Medicare Other

## 2012-08-04 DIAGNOSIS — R609 Edema, unspecified: Secondary | ICD-10-CM

## 2012-08-04 MED ORDER — AMIODARONE HCL 400 MG PO TABS: 400.0000 mg | ORAL_TABLET | Freq: Two times a day (BID) | ORAL | Status: DC

## 2012-08-04 MED ORDER — GUAIFENESIN ER 600 MG PO TB12: 1200.0000 mg | ORAL_TABLET | Freq: Two times a day (BID) | ORAL | Status: AC

## 2012-08-04 MED ORDER — MINOCYCLINE HCL 100 MG PO CAPS: 100.0000 mg | ORAL_CAPSULE | Freq: Two times a day (BID) | ORAL | Status: DC

## 2012-08-04 MED ORDER — IPRATROPIUM BROMIDE 0.02 % IN SOLN: 0.5000 mg | Freq: Three times a day (TID) | RESPIRATORY_TRACT | Status: AC

## 2012-08-04 MED ORDER — DIGOXIN 125 MCG PO TABS: 0.1250 mg | ORAL_TABLET | Freq: Every day | ORAL | Status: AC

## 2012-08-04 MED ORDER — OXYCODONE HCL 5 MG PO TABS: 5.0000 mg | ORAL_TABLET | ORAL | Status: AC | PRN

## 2012-08-04 MED ORDER — TAMSULOSIN HCL 0.4 MG PO CAPS
0.4000 mg | ORAL_CAPSULE | Freq: Two times a day (BID) | ORAL | Status: DC
  Administered 2012-08-04 – 2012-08-09 (×10): 0.4 mg via ORAL
  Filled 2012-08-04 (×11): qty 1

## 2012-08-04 MED ORDER — OXYCODONE HCL 5 MG PO TABS: 5.0000 mg | ORAL_TABLET | ORAL | Status: DC | PRN

## 2012-08-04 MED ORDER — IPRATROPIUM BROMIDE 0.02 % IN SOLN: 0.5000 mg | Freq: Three times a day (TID) | RESPIRATORY_TRACT | Status: DC

## 2012-08-04 NOTE — Progress Notes (Signed)
Physical Therapy Treatment Patient Details Name: Christian [name] MRN: [id number] DOB: 11/23/1931 Today's Date: 08/04/2012 Time: 4098-1191 PT Time Calculation (min): 27 min  PT Assessment / Plan / Recommendation Comments on Treatment Session  Pt continues to show improvement with increase gait speed and decrease number of intermittent standing rest breaks due to SOB.      Follow Up Recommendations  SNF     Equipment Recommendations  None recommended by PT    Frequency Min 3X/week   Plan Discharge plan remains appropriate    Precautions / Restrictions Precautions Precautions: Fall Restrictions Weight Bearing Restrictions: No   Pertinent Vitals/Pain No c/o pain    Mobility  Bed Mobility Bed Mobility: Not assessed Details for Bed Mobility Assistance: Pt OOB in recliner Transfers Transfers: Sit to Stand;Stand to Sit Sit to Stand: 4: Min guard;With armrests;From chair/3-in-1 Stand to Sit: 4: Min guard;With armrests;To chair/3-in-1 Details for Transfer Assistance: Min guard for safety. Increased time to complete transfer. Ambulation/Gait Ambulation/Gait Assistance: 4: Min guard Ambulation Distance (Feet): 200 Feet Assistive device:  (W/C for equipment) Ambulation/Gait Assistance Details: Pt with increased gait speed and overall improved ambulation.  Pt Sa02 Decreased to 88% on 2L but returned to 93% on 2L with slow deep breathing.  Continues to need intermittent rest breaks. Gait Pattern: Shuffle;Trunk flexed;Step-through pattern Gait velocity: increased Stairs: No Wheelchair Mobility Wheelchair Mobility: No    Exercises General Exercises - Lower Extremity Quad Sets: Strengthening;Both;10 reps Long Arc Quad: Strengthening;Both;20 reps Hip Flexion/Marching: Strengthening;Both;20 reps   PT Diagnosis:    PT Problem List:   PT Treatment Interventions:     PT Goals Acute Rehab PT Goals PT Goal Formulation: With patient/family Time For Goal Achievement: Sep 07, 2012 Potential  to Achieve Goals: Good Pt will go Sit to Stand: with modified independence PT Goal: Sit to Stand - Progress: Progressing toward goal Pt will Ambulate: >150 feet;with modified independence;with least restrictive assistive device PT Goal: Ambulate - Progress: Progressing toward goal  Visit Information  Last PT Received On: 08/04/12 Assistance Needed: +1    Subjective Data  Subjective: "I'm doing so much better after they got 1.5L off my lungs." Patient Stated Goal: To get stronger   Cognition  Overall Cognitive Status: Appears within functional limits for tasks assessed/performed Arousal/Alertness: Awake/alert Orientation Level: Appears intact for tasks assessed Behavior During Session: [facility] for tasks performed    Balance     End of Session PT - End of Session Equipment Utilized During Treatment: Gait belt Activity Tolerance: Patient tolerated treatment well Patient left: in chair;with call bell/phone within reach Nurse Communication: Mobility status   GP     Brianah Hopson 08/04/2012, 12:16 PM Jake Shark, PT DPT [phone]

## 2012-08-04 NOTE — Progress Notes (Signed)
Clinical Child psychotherapist (CSW) to remain following for SNF placement.  Theresia Bough, MSW, Theresia Majors [phone]

## 2012-08-04 NOTE — Progress Notes (Signed)
Patient being transferred to 2035 via wheelchair. Phone report called to [facility] who is in charge. Patient's wife and daughter visiting the patient and are aware of the new room number.

## 2012-08-04 NOTE — Progress Notes (Addendum)
[address]            [place] [address]          [phone]     14 Days Post-Op  Procedure(s) (LRB): VIDEO ASSISTED THORACOSCOPY (VATS)/EMPYEMA (Left) VIDEO BRONCHOSCOPY (Bilateral) Subjective: Feels he is better, did get dizzy with ambulation   Objective  Telemetry afib  Temp:  [97.7 F (36.5 C)-98.7 F (37.1 C)] 98 F (36.7 C) (01/02 0300) Pulse Rate:  [77-99] 77  (01/02 0400) Resp:  [15-21] 18  (01/02 0400) BP: (92-113)/(45-60) 94/45 mmHg (01/02 0300) SpO2:  [98 %-100 %] 98 % (01/02 0631)   Intake/Output Summary (Last 24 hours) at 08/04/12 0741 Last data filed at 08/04/12 0400  Gross per 24 hour  Intake      0 ml  Output   1675 ml  Net  -1675 ml       General appearance: alert, cooperative and no distress Heart: irregularly irregular rhythm Lungs: dim in bases Abdomen: soft, nontender Extremities: + BLE edema- same Wound: incis ok  Lab Results:  Basename 08/03/12 0530  NA 137  K 4.4  CL 100  CO2 34*  GLUCOSE 104*  BUN 42*  CREATININE 1.31  CALCIUM 8.2*  MG 1.9  PHOS 3.3   No results found for this basename: AST:2,ALT:2,ALKPHOS:2,BILITOT:2,PROT:2,ALBUMIN:2 in the last 72 hours No results found for this basename: LIPASE:2,AMYLASE:2 in the last 72 hours  Basename 08/03/12 0530  WBC 14.8*  NEUTROABS --  HGB 9.0*  HCT 27.2*  MCV 88.6  PLT 207   No results found for this basename: CKTOTAL:4,CKMB:4,TROPONINI:4 in the last 72 hours No components found with this basename: POCBNP:3 No results found for this basename: DDIMER in the last 72 hours No results found for this basename: HGBA1C in the last 72 hours No results found for this basename: CHOL,HDL,LDLCALC,TRIG,CHOLHDL in the last 72 hours No results found for this basename: TSH,T4TOTAL,FREET3,T3FREE,THYROIDAB in the last 72 hours No results found for this basename: VITAMINB12,FOLATE,FERRITIN,TIBC,IRON,RETICCTPCT in the last 72  hours  Medications: Scheduled    . amiodarone  400 mg Oral BID  . antiseptic oral rinse  15 mL Mouth Rinse QID  . chlorhexidine  15 mL Mouth Rinse BID  . digoxin  0.125 mg Oral Daily  . enoxaparin (LOVENOX) injection  40 mg Subcutaneous Q24H  . feeding supplement  237 mL Oral TID BM  . feeding supplement  1 Container Oral TID WC  . furosemide  40 mg Oral BID  . guaiFENesin  1,200 mg Oral BID  . ipratropium  0.5 mg Nebulization Q6H  . lactose free nutrition  237 mL Oral TID WC  . levalbuterol  0.63 mg Nebulization Q6H  . minocycline  100 mg Oral Q12H  . pantoprazole  40 mg Oral q morning - 10a  . potassium chloride  40 mEq Oral Daily  . sodium chloride  10-40 mL Intracatheter Q12H  . Tamsulosin HCl  0.4 mg Oral BID     Radiology/Studies:  Dg Chest 1 View  08/02/2012  *RADIOLOGY REPORT*  Clinical Data: Right thoracentesis  CHEST - 1 VIEW  Comparison: CT scan from 08/01/2012.  Chest x-ray from 08/01/2012  Findings: Right pleural effusion has decreased in the interval.  No evidence for pneumothorax.  Left base appears better aerated than on the previous study with persistent patchy left upper and lower lung opacities.  Left pleural effusion  again noted. The cardiopericardial silhouette is enlarged.  Right PICC line persists and the tip it is difficult to discern but appears to be at about the level of the mid SVC.  IMPRESSION: Interval decrease in right pleural effusion without evidence for pneumothorax status post thoracentesis.   Original Report Authenticated By: Kennith Center, M.D.    US Thoracentesis Asp Pleural Space W/img Guide  08/02/2012  *RADIOLOGY REPORT*  Clinical Data:  Recent left-sided empyema status post VATS, new right-sided pleural effusion  ULTRASOUND GUIDED right THORACENTESIS  Comparison:  None  An ultrasound guided thoracentesis was thoroughly discussed with the patient and questions answered.  The benefits, risks, alternatives and complications were also discussed.   The patient understands and wishes to proceed with the procedure.  Written consent was obtained.  Ultrasound was performed to localize and mark an adequate pocket of fluid in the right chest.  The area was then prepped and draped in the normal sterile fashion.  1% Lidocaine was used for local anesthesia.  Under ultrasound guidance a 19 gauge Yueh catheter was introduced.  Thoracentesis was performed.  The catheter was removed and a dressing applied.  Complications:  None immediate  Findings: A total of approximately 1.5 liters of clear yellow fluid was removed. A fluid sample was sent for laboratory analysis.  IMPRESSION: Successful ultrasound guided right thoracentesis yielding 1.5 liters of pleural fluid.  Read by Brayton El PA-C   Original Report Authenticated By: Irish Lack, M.D.     INR: Will add last result for INR, ABG once components are confirmed Will add last 4 CBG results once components are confirmed  Assessment/Plan: S/P Procedure(s) (LRB): VIDEO ASSISTED THORACOSCOPY (VATS)/EMPYEMA (Left) VIDEO BRONCHOSCOPY (Bilateral)  1 increased pleuro-parechymal changes on left with poss more effusion. Clinically feels better, maintaining good SaO2 2 cont aggressive medical pulm management 3 cont current afib management 4 Disposition- SNF return rec by PT   LOS: 16 days    Essynce Munsch E 1/2/20147:41 AM    Patient seen and examined. Agree with above Transfer to 2000 Possibly to SNF soon- has been at [facility] previously

## 2012-08-04 NOTE — Progress Notes (Signed)
Clinical Social Worker (CSW) observed that pt transferred to 2035. CSW has informed covering Unit CSW of plan for pt to return to [facility] when medically stable. This CSW signing off.  Theresia Bough, MSW, Theresia Majors [phone]

## 2012-08-04 NOTE — Progress Notes (Signed)
PULMONARY  / CRITICAL CARE MEDICINE  Name: Christian [name] MRN: [id number] DOB: [birth year]    LOS: 16  REFERRING MD :  [facility] PCP: Kriste Basque CHIEF COMPLAINT:  Loculated left pleural effusion/empyema    BRIEF PATIENT DESCRIPTION:  79 yo male, PCP of [name], seen in office 12-17 with increased effusion. He was discharged from [facility] 11/25 to 12-10 to [facility] after treatment for MRSA necrotizing pna.  VATS/decortication 12/19 > empyema  LINES / TUBES: R IJ CVL 12/19 >> 12/20  ETT 12/19 >> 12/20; extubated  PICC 12/20 >>   CULTURES: 12/18 MRSA PCR >> POS 12/19 pleural fluid >> NEG 12/19 Bronch washings - STENOTROPHOMONAS (resistant to bactrim, intermediate to Ceftaz and Levoflox, sens to Minocyclin)   ANTIBIOTICS: Levaquin12/18 >> 12/20 vanc 12/18 >>12/23 Cefepime 12/18 >>12/23 TYGACIL 12/23 (ID consult) >> 12/27 not tolerating due to nausea and vomiting  Minocycline 12/27>>    SIGNIFICANT EVENTS:  12/20 VATS decortication. Findings c/w empyema 07/24/12: Adequate sats , no increased work of breathing  07/25/12: No complaints. Feels gasseous in abdomen and a bit tired but improving.    SUBJECTIVE/OVERNIGHT/INTERVAL HX Continues to improve   VITAL SIGNS: Temp:  [97.7 F (36.5 C)-98.2 F (36.8 C)] 97.7 F (36.5 C) (01/02 1500) Pulse Rate:  [77-99] 92  (01/02 1111) Resp:  [15-21] 18  (01/02 0400) BP: (94-113)/(45-58) 107/51 mmHg (01/02 1500) SpO2:  [98 %-100 %] 99 % (01/02 1500)  HEMODYNAMICS:   VENTILATOR SETTINGS:   INTAKE / OUTPUT: Intake/Output      01/01 0701 - 01/02 0700 01/02 0701 - 01/03 0700   P.O.  840   I.V. (mL/kg)  20 (0.2)   Total Intake(mL/kg)  860 (9.7)   Urine (mL/kg/hr) 1675 (0.8) 325 (0.4)   Total Output 1675 325   Net -1675 +535        Urine Occurrence  1 x   Stool Occurrence  1 x     PHYSICAL EXAMINATION: General: Deconditioned. Frail, NAD, Neuro: no focal deficits HEENT:  WNL Cardiovascular: RRR s M Lungs: Dull L>R  base Abdomen:   Ext: 3+ LE edema   LABS: Cbc  Lab 08/03/12 0530 07/30/12 0410  WBC 14.8* --  HGB 9.0* 10.4*  HCT 27.2* 30.6*  PLT 207 233    Chemistry   Lab 08/03/12 0530 08/01/12 0500 07/31/12 0425  NA 137 135 138  K 4.4 4.3 4.1  CL 100 99 102  CO2 34* 32 32  BUN 42* 45* 48*  CREATININE 1.31 1.29 1.44*  CALCIUM 8.2* 7.9* 8.0*  MG 1.9 -- --  PHOS 3.3 -- --  GLUCOSE 104* 103* 109*     CXR: increased opacity in L base  DIAGNOSES:  Necrotizing pneumonia, community acquired MRSA  Empyema of left pleural space  Atelectasis of left lung  COPD (chronic obstructive pulmonary disease)  LE edema  Resistant stenotrophomonas in resp secretions    PLAN: Post op mgmt per TCTS Abx per ID Agree with continued diuresis PT/OT as ordered If he is not going to receive long term IV abx, would consider removal of PICC when no longer needed D/C planning   PCCM will sign off. Please call if we can be of further assistance   Billy Fischer, MD ; [facility] [phone].  After 5:30 PM or weekends, call [phone]

## 2012-08-04 NOTE — Discharge Summary (Signed)
Physician Discharge Summary  Patient ID: Christian [name] MRN: [id number] DOB/AGE: September 27, 1931 79 y.o.  Admit date: 07/19/2012 Discharge date: 08/05/2012  Admission Diagnoses: 1.Necrotizing pneumonia (community acquired MRSA) 2.Left pleural effusion and empyema 3.History of COPD 4. History of CAD (coronary artery disease) 5.History of essential hypertension  6.History of aflutter (s/p ablation) 7.History of dyslipidemia 8.History of prostate with urinary obstruction and other lower urinary tract symptoms (LUTS)  9.History of malignant melanoma 10.History of mild AS and MS 11.Remote history of tobacco abuse  Discharge Diagnoses:  1.Necrotizing pneumonia (community acquired MRSA) 2.Left pleural effusion and empyema 3.History of COPD 4. History of CAD (coronary artery disease) 5.History of essential hypertension  6.History of aflutter (s/p ablation) 7.History of dyslipidemia 8.History of prostate with urinary obstruction and other lower urinary tract symptoms (LUTS)  9.History of malignant melanoma 10.History of mild AS and MS 11.Remote history of tobacco abuse 12. Atrial fibrillation    Procedure (s):  1.Left thoracentesis 07/21/2012 2.Left thoracentesis 08/02/2012 3.Bronchoscopy, left video-assisted thoracoscopy, drainage of  empyema, and decortication by Dr. Dorris Fetch on 07/21/2012   Pathology: Pleura, peel, Left - BENIGN REACTIVE PLEURA WITH ACUTE AND CHRONIC INFLAMMATION, FIBROINFLAMMATORY EXUDATE, AND FIBRINOID DEBRIS, CONSISTENT WITH HISTORY OF EMPYEMA. - NEGATIVE FOR MALIGNANCY.  History of Presenting Illness: This is 79 year old Caucasian male who, just after Thanksgiving, was was admitted to [facility] with COPD exacerbation and  necrotizing community-acquired MRSA pneumonia. The patient aspirated during his inpatient stay. A swallow study showed silent aspiration. He had a short stay in the ICU. The patient was discharged approximately week ago to  [facility]. Since discharge, he has received antibiotics, but he has remained short of breath. He was oxygen dependent and hypoxic at the time of discharge. On July 20, 2012,, he had a follow up appointment in the Pulmonary office. A chest x-ray revealed possible worsening pneumonia and increasing left pleural effusion. He was sent to the ER to be evaluated and was admitted to [facility]. Attempts at thoracentesis was not successful. Chest xray on 07/21/2012 showed white out of left pleural space.  He has a previous history of mass (melanoma) resected from left chest via sternotomy by Dr Andrey Campanile (in 1980?) . He notes left diaphragm was injured at that time.  He was transferred to [facility] for further evaluation and treatment. A consult was obtained with Dr. Dorris Fetch for consideration of bronchoscopy, a left VATS, drainage of left pleural effusion, and decortication. Potential risks, benefits, and complications were discussed with the patient and he agreed to proceed. He underwent a bronchoscopy and left lung surgery on 07/21/2012.  Brief Hospital Course:  He remained intubated and sedated a couple of days post op. He did have hypotension and was given volume as well as started on Neo synephrine. He was continued on Vancomycin, Cefepime, and Levaquin. He did have ABL anemia. His H and H went as low as 8 and 23.5 and he was given a transfusion. His last H and H was up to 9 and 27.2.He was extubated on 12/20 without difficulty. Critical care followed him post op as well. He had a fair amount of output from his chest tubes as well as a productive cough. A consult was obtained with infectious disease, as cultures revealed STENOTROPHOMONAS MALTOPHILIA. AFB and fungus were negative. Antibiotics were changed to Tygacil IV. He was found to have increasing lower extremity edema so he was diuresed accordingly.He went into afib with RVR. He was started on Amiodarone. He then required  Digoxin,as Amiodarone was stopped secondary to nausea. It was later determined that Tygacil was likely cause of nausea. Amiodarone was restarted for better rate control. He did remain in a fib.He is likely not a good candidate for Coumadin.A beta blocker was avoided secondary to labile blood pressure and COPD. His chest tube output decreased and there was no air leak.His anterior chest tube was removed on 12/26. Remaining chest tube was removed 12/28. His antibiotic was then changed to Minocycline. He had a fairly large right pleural effusion. He underwent a right thoracentesis via [place] on 12/31. 1.5 liters was removed. He has been tolerating a diet and has had a bowel movement. His wound is clean, dry, and continuing to heal. He is ambulating fairly well Provided he remains afebrile, hemodynamically stable, and pending morning round evaluation, he will be surgically stable for SNF 08/05/2012.   Latest Vital Signs: Blood pressure 101/53, pulse 77, temperature 98.2 F (36.8 C), temperature source Oral, resp. rate 18, height 6' (1.829 m), weight 88.8 kg (195 lb 12.3 oz), SpO2 98.00%.  Physical Exam: General appearance: alert, cooperative and no distress  Heart: irregularly irregular rhythm  Lungs: dim in bases  Abdomen: soft, nontender  Extremities: + BLE edema- same  Wound: incis ok   Discharge Condition:Stable  Recent laboratory studies:  Lab Results  Component Value Date   WBC 14.8* 08/03/2012   HGB 9.0* 08/03/2012   HCT 27.2* 08/03/2012   MCV 88.6 08/03/2012   PLT 207 08/03/2012   Lab Results  Component Value Date   NA 137 08/03/2012   K 4.4 08/03/2012   CL 100 08/03/2012   CO2 34* 08/03/2012   CREATININE 1.31 08/03/2012   GLUCOSE 104* 08/03/2012     Diagnostic Studies:   Ct Chest Wo Contrast  08-27-2012  *RADIOLOGY REPORT*  Clinical Data: Left base opacity, question atelectasis or effusion.  CT CHEST WITHOUT CONTRAST  Technique:  Multidetector CT imaging of the chest was performed following the  standard protocol without IV contrast.  Comparison: Chest x-ray Aug 27, 2012.  Chest CT 07/21/2012.  Findings: Moderate to large right pleural effusion, slightly increased since prior CT.  Small left pleural effusion which has decreased significantly since prior study.  This appears at least partially loculated superiorly.  Left lung airspace disease throughout both the upper and lower lobe, more confluent in the lower lobe. This has improved since prior CT from 07/02/2012. There is a small locule of air noted on series 3 image 31 posterior medially in the left lower hemithorax.  I cannot exclude this represents pleural air and a small loculated hydropneumothorax Compressive atelectasis in the right lower lobe.  Changes of prior median sternotomy.  Extensive calcifications present, including substernal which extends along the anterior aspect of the proximal pulmonary artery and pulmonary outflow tract.  There also extensive left pleural calcifications anteriorly and pericardial calcifications as well.  Small pericardial effusion present.  No morphologic features noted at this time to suggest constrictive physiology.  No mediastinal, hilar or axillary adenopathy. Visualized thyroid and chest wall soft tissues unremarkable.  There is a wedge shaped low density area anteriorly within the spleen.  When comparing to prior study, this was likely present on the 07/21/2012, not present on 07/02/2012.  This is much better defined and visualized today's study and is concerning for splenic infarct.  IMPRESSION: Decreasing left pleural effusion and left airspace disease since prior study.  Small partially loculated left effusion persist with patchy airspace disease in both the left upper and  left lower lobes.  There is suggestion of a small loculated hydropneumothorax in the posterior medial lower left hemithorax.  Large right pleural effusion, increased since prior study. Compressive atelectasis in the right lower lobe.   Extensive mediastinal, anterior left pleural and pericardial calcifications.  Small pericardial effusion.  No morphologic features to suggest constrictive physiology.  Wedge shaped low density area within the anterior spleen, which was likely present on prior study from 07/21/2012 but is better visualized and defined on today's study concerning for splenic infarct.   Original Report Authenticated By: Charlett Nose, M.D.    Ct Chest Without Contrast  07/21/2012  *RADIOLOGY REPORT*  Clinical Data: Postop evaluation of empyema  CT CHEST WITHOUT CONTRAST  Technique:  Multidetector CT imaging of the chest was performed following the standard protocol without IV contrast.  Comparison: Thorax 11/30 1013  Findings: Exam is degraded by patient respirator motion and.  The left hemithorax is completely opacified by pleural fluid which has simple fluid density.  The right upper lobe and right lower lobe are collapsed centrally towards the hilum.  There is a much smaller pleural effusion on the right with mild right basilar consolidation (image 41) improved from prior.  The right upper lobe is clear.  The right mainstem bronchus appears normal.  The distal left main stem bronchus (at the level of the lingular bronchus) contains an endoluminal plug  measuring 2.1 x 1.4  centimeters (image 25). This could represent a endobronchial neoplasm versus mucous plugging or infection.  The heart appears normal without pericardial fluid.   There is valvular calcifications.  Prior sternotomy.  Review of the upper abdomen is unremarkable.  Review the skeleton demonstrates degenerative change.  IMPRESSION:  1.  Filling defect within the distal left main stem bronchus representing either endobronchial neoplasm versus mucous plugging. Consider bronchoscopy for evaluation.  2.  Complete filling of the left hemithorax with pleural fluid and atelectatic lung which contracts centrally toward the hilum. 3.  Small right effusion and right lower lobe  consolidation.  The effusion is new while the consolidation is improved.   Original Report Authenticated By: Genevive Bi, M.D.    Dg Chest Port 1 View  08/04/2012  *RADIOLOGY REPORT*  Clinical Data: Left empyema status post VATS  PORTABLE CHEST - 1 VIEW  Comparison: 08/02/2012  Findings: Enlarging left effusion with increased left lung atelectasis / consolidation.  Stable small right base effusion and worsening right base atelectasis/airspace disease.  Median sternotomy wires noted.  Right PICC line tip in the SVC.  Exam is rotated to the left.  IMPRESSION: Increasing left effusion with worsening left lung atelectasis / consolidation.  Stable small right effusion with slight worsening right base airspace disease/atelectasis.  No pneumothorax   Original Report Authenticated By: Judie Petit. Shick, M.D.    US Thoracentesis Asp Pleural Space W/img Guide  08/02/2012  *RADIOLOGY REPORT*  Clinical Data:  Recent left-sided empyema status post VATS, new right-sided pleural effusion  ULTRASOUND GUIDED right THORACENTESIS  Comparison:  None  An ultrasound guided thoracentesis was thoroughly discussed with the patient and questions answered.  The benefits, risks, alternatives and complications were also discussed.  The patient understands and wishes to proceed with the procedure.  Written consent was obtained.  Ultrasound was performed to localize and mark an adequate pocket of fluid in the right chest.  The area was then prepped and draped in the normal sterile fashion.  1% Lidocaine was used for local anesthesia.  Under ultrasound guidance a 19 gauge Griffith Citron  catheter was introduced.  Thoracentesis was performed.  The catheter was removed and a dressing applied.  Complications:  None immediate  Findings: A total of approximately 1.5 liters of clear yellow fluid was removed. A fluid sample was sent for laboratory analysis.  IMPRESSION: Successful ultrasound guided right thoracentesis yielding 1.5 liters of pleural fluid.  Read by  Brayton El PA-C   Original Report Authenticated By: Irish Lack, M.D.    US Thoracentesis Asp Pleural Space W/img Guide  07/21/2012  *RADIOLOGY REPORT*  Clinical Data:  Loculated left pleural effusion  ULTRASOUND GUIDED left THORACENTESIS  Comparison:  None  An ultrasound guided thoracentesis was thoroughly discussed with the patient and questions answered.  The benefits, risks, alternatives and complications were also discussed.  The patient understands and wishes to proceed with the procedure.  Written consent was obtained.  Ultrasound was performed to localize and mark an adequate pocket of fluid in the left chest.  The area was then prepped and draped in the normal sterile fashion.  1% Lidocaine was used for local anesthesia.  Under ultrasound guidance a 19 gauge Yueh catheter was introduced.  Thoracentesis was performed.  The catheter was removed and a dressing applied.  Complications:  None  Findings: A total of approximately 25 ml of bloody fluid was removed. A fluid sample was sent for laboratory analysis.  IMPRESSION: Successful ultrasound guided left thoracentesis yielding 25 ml of pleural fluid. Effusion very loculated.  Informed ordering MD.  Was asked to proceed to gain as much fluid as possible and send for tests.  The patient's blood pressure was also low pre and during procedure. He was asymptomatic.  Post procedure blood pressure was 75/48.  Read by: Ralene Muskrat, P.A.-C   Original Report Authenticated By: Malachy Moan, M.D.    Discharge Orders    Future Appointments: Provider: Department: Dept Phone: Center:   08/19/2012 9:30 AM Michele Mcalpine, MD [facility] [phone] None      Discharge Medications:   Medication List     As of 08/04/2012  9:30 AM    STOP taking these medications         amoxicillin-clavulanate 875-125 MG per tablet   Commonly known as: AUGMENTIN      etodolac 400 MG 24 hr tablet   Commonly known as: LODINE XL       guaiFENesin-dextromethorphan 100-10 MG/5ML syrup   Commonly known as: ROBITUSSIN DM      linezolid 600 MG tablet   Commonly known as: ZYVOX      magic mouthwash Soln      trospium 20 MG tablet   Commonly known as: SANCTURA      TAKE these medications         alum & mag hydroxide-simeth 200-200-20 MG/5ML suspension   Commonly known as: MAALOX/MYLANTA   Take 30 mLs by mouth every 6 (six) hours as needed. Stomach discomfort.      amiodarone 400 MG tablet   Commonly known as: PACERONE   Take 1 tablet (400 mg total) by mouth 2 (two) times daily. For one week;then take Amiodarone 400 mg po daily thereafter      AVODART 0.5 MG capsule   Generic drug: dutasteride   TAKE 1 CAPSULE EVERY DAY      digoxin 0.125 MG tablet   Commonly known as: LANOXIN   Take 1 tablet (0.125 mg total) by mouth daily.      guaiFENesin 600 MG 12 hr tablet   Commonly known as:  MUCINEX   Take 2 tablets (1,200 mg total) by mouth 2 (two) times daily. PRN cough      ipratropium 0.02 % nebulizer solution   Commonly known as: ATROVENT   Take 2.5 mLs (0.5 mg total) by nebulization 3 (three) times daily.      levalbuterol 0.63 MG/3ML nebulizer solution   Commonly known as: XOPENEX   Take 3 mLs (0.63 mg total) by nebulization 3 (three) times daily.      minocycline 100 MG capsule   Commonly known as: MINOCIN,DYNACIN   Take 1 capsule (100 mg total) by mouth every 12 (twelve) hours. For 6 days then stop.      oxyCODONE 5 MG immediate release tablet   Commonly known as: Oxy IR/ROXICODONE   Take 1 tablet (5 mg total) by mouth every 4 (four) hours as needed for pain.      pantoprazole 40 MG tablet   Commonly known as: PROTONIX   Take 1 tablet (40 mg total) by mouth 2 (two) times daily before a meal.      polyethylene glycol packet   Commonly known as: MIRALAX / GLYCOLAX   Take 17 g by mouth daily as needed (constipation).      Tamsulosin HCl 0.4 MG Caps   Commonly known as: FLOMAX   Take 0.4 mg by mouth  2 (two) times daily.          Follow Up Appointments:     Follow-up Information    Follow up with Tyronne Blann C, MD. (PA/LAT CXR to be taken (at [facility] which is in the same building as Dr. Sunday Corn office) on 08/23/2012 at 9:30 am;Appointment with Dr. Dorris Fetch is on 08/23/2012 at 10:30 am)    Contact information:   [address] [place] [address] [phone]       Follow up with Oretha Milch., MD. (Call for a follow up appointment)    Contact information:   [address] [place] [address] [phone]          Signed: ZIMMERMAN,DONIELLE MPA-C 08/04/2012, 9:14 AM

## 2012-08-05 NOTE — Progress Notes (Addendum)
[address]            [place] [address]          [phone]     15 Days Post-Op  Procedure(s) (LRB): VIDEO ASSISTED THORACOSCOPY (VATS)/EMPYEMA (Left) VIDEO BRONCHOSCOPY (Bilateral) Subjective: Feeling ok, no new complaints  Objective  Telemetry afib  Temp:  [97.7 F (36.5 C)-98.6 F (37 C)] 98.4 F (36.9 C) (01/03 0449) Pulse Rate:  [76-96] 76  (01/03 0449) Resp:  [17-18] 17  (01/03 0449) BP: (90-107)/(44-58) 95/47 mmHg (01/03 0449) SpO2:  [98 %-99 %] 99 % (01/03 0449)   Intake/Output Summary (Last 24 hours) at 08/05/12 0754 Last data filed at 08/05/12 0454  Gross per 24 hour  Intake    860 ml  Output   1100 ml  Net   -240 ml       General appearance: alert, cooperative and no distress Heart: irregularly irregular rhythm Lungs: dim in left lower fields Abdomen: soft, nontender Extremities: edema slightly improved Wound: incis ok  Lab Results:  Basename 08/03/12 0530  NA 137  K 4.4  CL 100  CO2 34*  GLUCOSE 104*  BUN 42*  CREATININE 1.31  CALCIUM 8.2*  MG 1.9  PHOS 3.3   No results found for this basename: AST:2,ALT:2,ALKPHOS:2,BILITOT:2,PROT:2,ALBUMIN:2 in the last 72 hours No results found for this basename: LIPASE:2,AMYLASE:2 in the last 72 hours  Basename 08/03/12 0530  WBC 14.8*  NEUTROABS --  HGB 9.0*  HCT 27.2*  MCV 88.6  PLT 207   No results found for this basename: CKTOTAL:4,CKMB:4,TROPONINI:4 in the last 72 hours No components found with this basename: POCBNP:3 No results found for this basename: DDIMER in the last 72 hours No results found for this basename: HGBA1C in the last 72 hours No results found for this basename: CHOL,HDL,LDLCALC,TRIG,CHOLHDL in the last 72 hours No results found for this basename: TSH,T4TOTAL,FREET3,T3FREE,THYROIDAB in the last 72 hours No results found for this basename: VITAMINB12,FOLATE,FERRITIN,TIBC,IRON,RETICCTPCT in the last 72 hours  Medications: Scheduled    . amiodarone  400 mg Oral BID  . antiseptic oral rinse  15 mL Mouth Rinse QID  . chlorhexidine  15 mL Mouth Rinse BID  . digoxin  0.125 mg Oral Daily  . enoxaparin (LOVENOX) injection  40 mg Subcutaneous Q24H  . feeding supplement  237 mL Oral TID BM  . feeding supplement  1 Container Oral TID WC  . furosemide  40 mg Oral BID  . guaiFENesin  1,200 mg Oral BID  . ipratropium  0.5 mg Nebulization Q6H  . lactose free nutrition  237 mL Oral TID WC  . levalbuterol  0.63 mg Nebulization Q6H  . minocycline  100 mg Oral Q12H  . pantoprazole  40 mg Oral q morning - 10a  . potassium chloride  40 mEq Oral Daily  . sodium chloride  10-40 mL Intracatheter Q12H  . Tamsulosin HCl  0.4 mg Oral BID     Radiology/Studies:  Dg Chest Port 1 View  08/04/2012  *RADIOLOGY REPORT*  Clinical Data: Left empyema status post VATS  PORTABLE CHEST - 1 VIEW  Comparison: 08/02/2012  Findings: Enlarging left effusion with increased left lung atelectasis / consolidation.  Stable small right base effusion and worsening right base atelectasis/airspace disease.  Median sternotomy wires noted.  Right PICC line tip in the SVC.  Exam is rotated to the left.  IMPRESSION: Increasing left  effusion with worsening left lung atelectasis / consolidation.  Stable small right effusion with slight worsening right base airspace disease/atelectasis.  No pneumothorax   Original Report Authenticated By: Judie Petit. Miles Costain, M.D.     INR: Will add last result for INR, ABG once components are confirmed Will add last 4 CBG results once components are confirmed  Assessment/Plan: S/P Procedure(s) (LRB): VIDEO ASSISTED THORACOSCOPY (VATS)/EMPYEMA (Left) VIDEO BRONCHOSCOPY (Bilateral)  1. Stable, conts current rx/tx 2 could poss go to [facility] on O2 if bed available     LOS: 17 days    [name],[name] 1/3/20147:54 AM    Patient seen and examined. Agree with above. He continues to make good progress. He's still on some O2 but otherwise is  stable medically Plan to complete 6 weeks of antibiotics with oral minocycline. He wants to go home instead of going back to [facility]. I'm not sure that will be a viable option but will see how he does over the next few days.

## 2012-08-05 NOTE — Progress Notes (Signed)
Occupational Therapy Treatment Patient Details Name: Christian [name] MRN: [id number] DOB: 1932/02/04 Today's Date: 08/05/2012 Time: 4098-1191 OT Time Calculation (min): 33 min  OT Assessment / Plan / Recommendation Comments on Treatment Session Pt progressing well.  Demonstrates improving endurance as needed for BADLs    Follow Up Recommendations  SNF    Barriers to Discharge       Equipment Recommendations  None recommended by OT    Recommendations for Other Services    Frequency     Plan Discharge plan remains appropriate    Precautions / Restrictions Precautions Precautions: Fall Restrictions Weight Bearing Restrictions: No   Pertinent Vitals/Pain     ADL  Grooming: Wash/dry hands;Wash/dry face;Teeth care;Simulated;Min guard Where Assessed - Grooming: Supported standing Lower Body Bathing: Simulated;Minimal assistance Where Assessed - Lower Body Bathing: Supported sit to stand Lower Body Dressing: Simulated;Min guard Where Assessed - Lower Body Dressing: Supported sit to Pharmacist, hospital: [employer] Method: Sit to Barista: Comfort height toilet Equipment Used: Rolling walker Transfers/Ambulation Related to ADLs: Pt ambulates with min guard assist ADL Comments: Pt able to don/doff socks EOB with supervision and encouragement.  Initially states he can't    OT Diagnosis:    OT Problem List:   OT Treatment Interventions:     OT Goals ADL Goals ADL Goal: Grooming - Progress: Progressing toward goals ADL Goal: Toilet Transfer - Progress: Progressing toward goals ADL Goal: Toileting - Clothing Manipulation - Progress: Progressing toward goals Miscellaneous OT Goals OT Goal: Miscellaneous Goal #1 - Progress: Progressing toward goals  Visit Information  Last OT Received On: 08/05/12 Assistance Needed: +1    Subjective Data      Prior Functioning       Cognition  Overall Cognitive Status: Appears within functional  limits for tasks assessed/performed Arousal/Alertness: Awake/alert Orientation Level: Appears intact for tasks assessed Behavior During Session: [facility] for tasks performed    Mobility  Shoulder Instructions Bed Mobility Bed Mobility: Supine to Sit;Sitting - Scoot to Edge of Bed Supine to Sit: 4: Min assist;With rails Sitting - Scoot to [place] of Bed: 5: Supervision Transfers Transfers: Sit to Stand;Stand to Sit Sit to Stand: 4: Min guard;With upper extremity assist;From bed Stand to Sit: 4: Min guard;To chair/3-in-1;With upper extremity assist Details for Transfer Assistance: Pt requires min guard due to mild unsteadiness       Exercises      Balance     End of Session OT - End of Session Activity Tolerance: Patient tolerated treatment well Patient left: in chair;with call bell/phone within reach Nurse Communication: Mobility status  GO     Christian [name] M 08/05/2012, 4:03 PM

## 2012-08-06 LAB — BODY FLUID CULTURE: Culture: NO GROWTH

## 2012-08-06 NOTE — Progress Notes (Addendum)
                    [address]            [place] [address]          [phone]     16 Days Post-Op Procedure(s) (LRB): VIDEO ASSISTED THORACOSCOPY (VATS)/EMPYEMA (Left) VIDEO BRONCHOSCOPY (Bilateral)  Subjective: Feels well, no complaints.   Objective: Vital signs in last 24 hours: Patient Vitals for the past 24 hrs:  BP Temp Temp src Pulse Resp SpO2  08/06/12 0607 106/61 mmHg 97.1 F (36.2 C) Oral 77  19  91 %  08/06/12 0203 - - - - - 96 %  08/05/12 2122 95/57 mmHg 97.2 F (36.2 C) Oral 80  18  94 %  08/05/12 2040 - - - - - 99 %  08/05/12 1333 97/54 mmHg 98.6 F (37 C) Oral 78  18  93 %   Current Weight  07/27/12 195 lb 12.3 oz (88.8 kg)     Intake/Output from previous day: 01/03 0701 - 01/04 0700 In: 21308 [P.O.:24555] Out: 1101 [Urine:1100; Stool:1]    PHYSICAL EXAM:  Heart: Irr irr Lungs: Slightly decreased BS in L base Wound: Clean and dry    Lab Results: CBC:No results found for this basename: WBC:2,HGB:2,HCT:2,PLT:2 in the last 72 hours BMET: No results found for this basename: NA:2,K:2,CL:2,CO2:2,GLUCOSE:2,BUN:2,CREATININE:2,CALCIUM:2 in the last 72 hours  PT/INR: No results found for this basename: LABPROT,INR in the last 72 hours    Assessment/Plan: S/P Procedure(s) (LRB): VIDEO ASSISTED THORACOSCOPY (VATS)/EMPYEMA (Left) VIDEO BRONCHOSCOPY (Bilateral) Pulm- Continue pulm toilet/IS, wean O2 as tolerated.   ID- Continue abx x 6 weeks. Disp- to SNF ?first of the week.   LOS: 18 days    COLLINS,GINA H 08/06/2012    Chart reviewed, patient examined, agree with above.

## 2012-08-07 NOTE — Progress Notes (Signed)
Pt ambulated 350 feet in hallway with daughter, rolling walker, and O2; pt back to bed; call bell w/i reach; will cont. To monitor.

## 2012-08-07 NOTE — Progress Notes (Signed)
Pt refusing all Boost and Ensure drinks/pudding today; will cont. To monitor.

## 2012-08-07 NOTE — Progress Notes (Signed)
Pt ambulated 200 feet in hallway with NT, rolling walker, and O2 3L [place]; pt assisted back to bed; call bell w/i reach; will cont. To monitor.

## 2012-08-07 NOTE — Progress Notes (Addendum)
                    [address]            [place] [address]          [phone]     17 Days Post-Op Procedure(s) (LRB): VIDEO ASSISTED THORACOSCOPY (VATS)/EMPYEMA (Left) VIDEO BRONCHOSCOPY (Bilateral)  Subjective: Feels well, no complaints.   Objective: Vital signs in last 24 hours: Patient Vitals for the past 24 hrs:  BP Temp Temp src Pulse Resp SpO2  08/07/12 0829 - - - - - 94 %  08/07/12 0416 106/52 mmHg 97.8 F (36.6 C) Oral 79  18  98 %  08/07/12 0342 - - - - - 96 %  08/06/12 2116 - - - - - 95 %  08/06/12 1454 110/65 mmHg 97.6 F (36.4 C) Oral 75  18  94 %   Current Weight  07/27/12 195 lb 12.3 oz (88.8 kg)     Intake/Output from previous day: 01/04 0701 - 01/05 0700 In: 480 [P.O.:480] Out: 651 [Urine:650; Stool:1]    PHYSICAL EXAM:  Heart: Irr irr Lungs: Decreased BS L base Wound: Clean and dry    Lab Results: CBC:No results found for this basename: WBC:2,HGB:2,HCT:2,PLT:2 in the last 72 hours BMET: No results found for this basename: NA:2,K:2,CL:2,CO2:2,GLUCOSE:2,BUN:2,CREATININE:2,CALCIUM:2 in the last 72 hours  PT/INR: No results found for this basename: LABPROT,INR in the last 72 hours    Assessment/Plan: S/P Procedure(s) (LRB): VIDEO ASSISTED THORACOSCOPY (VATS)/EMPYEMA (Left) VIDEO BRONCHOSCOPY (Bilateral) Pulm- Still on 3L. Continue pulm toilet/IS, wean O2 as tolerated.  ID- Continue abx x 6 weeks.  Disp- to SNF soon. May need to send on O2.    LOS: 19 days    COLLINS,GINA H 08/07/2012    Chart reviewed, patient examined, agree with above.

## 2012-08-07 NOTE — Progress Notes (Signed)
Pt ambulated 250 feet in hallway with RN, rolling walker, and O2; pt assisted back to room to chair; call bell w/i reach; will cont. To monitor.

## 2012-08-08 ENCOUNTER — Inpatient Hospital Stay (HOSPITAL_COMMUNITY): Payer: Medicare Other

## 2012-08-08 MED ORDER — MINOCYCLINE HCL 100 MG PO CAPS: 100.0000 mg | ORAL_CAPSULE | Freq: Two times a day (BID) | ORAL | Status: DC

## 2012-08-08 MED ORDER — POTASSIUM CHLORIDE CRYS ER 20 MEQ PO TBCR: 40.0000 meq | EXTENDED_RELEASE_TABLET | Freq: Every day | ORAL | Status: AC

## 2012-08-08 MED ORDER — AMIODARONE HCL 400 MG PO TABS: 400.0000 mg | ORAL_TABLET | Freq: Two times a day (BID) | ORAL | Status: AC

## 2012-08-08 MED ORDER — MINOCYCLINE HCL 100 MG PO CAPS: 100.0000 mg | ORAL_CAPSULE | Freq: Two times a day (BID) | ORAL | Status: AC

## 2012-08-08 MED ORDER — FUROSEMIDE 40 MG PO TABS: 40.0000 mg | ORAL_TABLET | Freq: Two times a day (BID) | ORAL | Status: AC

## 2012-08-08 NOTE — Progress Notes (Signed)
Clinical Social Work  CSW spoke with [facility] who is agreeable to patient returning. CSW informed patient and dtr of [facility] bed. CSW will continue to follow and assist with dc when medically stable.  [place], [place] [phone]

## 2012-08-08 NOTE — Progress Notes (Signed)
NUTRITION FOLLOW UP  Intervention:    Continue to offer Ensure Complete & Ensure Pudding supplements RD to follow for nutrition care plan  Nutrition Dx:   Inadequate oral intake related to decreased appetite as evidenced by poor meal intake, improved  Goal:   Oral intake with meals & supplements to meet >/= 90% of estimated nutrition needs, progressing  Monitor:   PO & supplemental intake, weight, labs, I/O's  Assessment:   Patient s/p thoracentesis 12/31. States his appetite is "pretty good;" he tells me he doesn't like Ensure supplements. PO intake variable at 50-100% per flowsheet records.  Noted discharge summary completed today.  Height: Ht Readings from Last 1 Encounters:  07/21/12 6' (1.829 m)    Weight Status:   Wt Readings from Last 1 Encounters:  07/27/12 195 lb 12.3 oz (88.8 kg)    Re-estimated needs:  Kcal: 2100-2300 Protein: 100-115 gm Fluid: 2.1-2.3 L  Skin: extravasation with open areas on buttocks  Diet Order: Cardiac   Intake/Output Summary (Last 24 hours) at 08/08/12 1159 Last data filed at 08/08/12 0500  Gross per 24 hour  Intake    360 ml  Output   1100 ml  Net   -740 ml    Labs:   Lab 08/03/12 0530  NA 137  K 4.4  CL 100  CO2 34*  BUN 42*  CREATININE 1.31  CALCIUM 8.2*  MG 1.9  PHOS 3.3  GLUCOSE 104*    Scheduled Meds:   . amiodarone  400 mg Oral BID  . antiseptic oral rinse  15 mL Mouth Rinse QID  . chlorhexidine  15 mL Mouth Rinse BID  . digoxin  0.125 mg Oral Daily  . enoxaparin (LOVENOX) injection  40 mg Subcutaneous Q24H  . feeding supplement  237 mL Oral TID BM  . feeding supplement  1 Container Oral TID WC  . furosemide  40 mg Oral BID  . guaiFENesin  1,200 mg Oral BID  . ipratropium  0.5 mg Nebulization Q6H  . lactose free nutrition  237 mL Oral TID WC  . levalbuterol  0.63 mg Nebulization Q6H  . minocycline  100 mg Oral Q12H  . pantoprazole  40 mg Oral q morning - 10a  . potassium chloride  40 mEq Oral Daily    . sodium chloride  10-40 mL Intracatheter Q12H  . Tamsulosin HCl  0.4 mg Oral BID    Continuous Infusions:   . sodium chloride 20 mL/hr at 08/02/12 0720    Kirkland Hun, RD, LDN Pager #: [phone] After-Hours Pager #: [phone]

## 2012-08-08 NOTE — Progress Notes (Signed)
Clinical Social Work  CSW met with patient and dtr again at bedside per dtr's request. CSW explained [facility] was unable to offer a bed. Patient agreeable to return to [facility]. Dtr reports patient not ready to dc until chest xrays are complete. CSW sent information to [facility] and left a message regarding patient returning at dc.   [place], [place] [phone]

## 2012-08-08 NOTE — Progress Notes (Addendum)
Clinical Social Work Department CLINICAL SOCIAL WORK PLACEMENT NOTE 08/08/2012  Patient:  Christian [name], Christian [name]  Account Number:  [id number] Admit date:  07/19/2012  Clinical Social Worker:  Unk Lightning, LCSW  Date/time:  08/08/2012 12:30 PM  Clinical Social Work is seeking post-discharge placement for this patient at the following level of care:   SKILLED NURSING   (*CSW will update this form in Epic as items are completed)   08/08/2012  Patient/family provided with [facility] Department of Clinical Social Work's list of facilities offering this level of care within the geographic area requested by the patient (or if unable, by the patient's family).  08/08/2012  Patient/family informed of their freedom to choose among providers that offer the needed level of care, that participate in Medicare, Medicaid or managed care program needed by the patient, have an available bed and are willing to accept the patient.  08/08/2012  Patient/family informed of [facility]' ownership interest in [facility], as well as of the fact that they are under no obligation to receive care at this facility.  PASARR submitted to EDS on existing # PASARR number received from EDS on   FL2 transmitted to all facilities in geographic area requested by pt/family on  08/08/2012 FL2 transmitted to all facilities within larger geographic area on   Patient informed that his/her managed care company has contracts with or will negotiate with  certain facilities, including the following:     Patient/family informed of bed offers received:   08/08/2012 Patient chooses bed at [facility] Physician recommends and patient chooses bed at    Patient to be transferred to  on  [facility] on  08/09/2012 Patient to be transferred to facility by [facility]  The following physician request were entered in Epic:   Additional Comments:

## 2012-08-08 NOTE — Progress Notes (Signed)
Physical Therapy Treatment Patient Details Name: Christian [name] MRN: [id number] DOB: [birth year] Today's Date: 08/08/2012 Time: 8295-6213 PT Time Calculation (min): 32 min  PT Assessment / Plan / Recommendation Comments on Treatment Session  Patient able to ambulate in hallway this pm after going with nursing this am.  No longer noted knee buckling, but with turns loss of balance requiring assist to correct (patient states due to swelling of testicles).  Will benefit from skilled PT continuing for improved activity tolerance, safety and LE strengthening.      Follow Up Recommendations  SNF                 Equipment Recommendations  None recommended by PT       Frequency Min 3X/week   Plan Discharge plan remains appropriate    Precautions / Restrictions Precautions Precautions: Fall Precaution Comments: O2 dependent   Pertinent Vitals/Pain Denies    Mobility  Bed Mobility Details for Bed Mobility Assistance: Pt up in chair Transfers Sit to Stand: 4: Min assist;With upper extremity assist;From toilet;From chair/3-in-1 Stand to Sit: 4: Min assist;With upper extremity assist;To toilet;To chair/3-in-1 Ambulation/Gait Ambulation/Gait Assistance: 4: Min assist Ambulation Distance (Feet): 100 Feet Assistive device: Rolling walker Ambulation/Gait Assistance Details: Maintained on 2 L O2 during session.  Gait Pattern: Step-through pattern;Decreased stride length;Trunk flexed    Exercises General Exercises - Lower Extremity Ankle Circles/Pumps: Both;15 reps;Seated;AROM Short Arc Quad: AROM;Both;10 reps;Seated Hip ABduction/ADduction: AROM;Both;10 reps;Seated Straight Leg Raises: AROM;Both;Seated;10 reps     PT Goals Acute Rehab PT Goals Pt will go Sit to Stand: with modified independence PT Goal: Sit to Stand - Progress: Progressing toward goal Pt will Ambulate: >150 feet;with modified independence;with least restrictive assistive device PT Goal: Ambulate - Progress:  Progressing toward goal  Visit Information  Last PT Received On: 08/08/12    Subjective Data  Subjective: Get off balance with balls hanging between my legs   Cognition  Overall Cognitive Status: Appears within functional limits for tasks assessed/performed Arousal/Alertness: Awake/alert Behavior During Session: [facility] for tasks performed         End of Session PT - End of Session Equipment Utilized During Treatment: Gait belt Activity Tolerance: Patient tolerated treatment well Patient left: in chair;with call bell/phone within reach;with family/visitor present   GP     [facility] 08/08/2012, 5:37 PM Sheran Lawless, PT [phone] 08/08/2012

## 2012-08-08 NOTE — Progress Notes (Addendum)
                   [address]            [place] [address]          [phone]     18 Days Post-Op  Procedure(s) (LRB): VIDEO ASSISTED THORACOSCOPY (VATS)/EMPYEMA (Left) VIDEO BRONCHOSCOPY (Bilateral) Subjective: conts to feel better   Objective  Telemetry afib with CVR  Temp:  [97.9 F (36.6 C)-98 F (36.7 C)] 98 F (36.7 C) (01/06 0513) Pulse Rate:  [70-77] 73  (01/06 0513) Resp:  [16-18] 18  (01/06 0513) BP: (95-104)/(44-55) 104/44 mmHg (01/06 0513) SpO2:  [92 %-94 %] 93 % (01/06 0513) FiO2 (%):  [97 %] 97 % (01/05 1434)   Intake/Output Summary (Last 24 hours) at 08/08/12 0730 Last data filed at 08/08/12 0500  Gross per 24 hour  Intake    720 ml  Output   1200 ml  Net   -480 ml   Physical Examination: General appearance - NAD Chest - dim in bases Heart - IRR IRR, soft systolic aortic murmur Abdomen - soft, nontender Extremities - periph edema stable Skin - incisions OK   Lab Results: No results found for this basename: NA:2,K:2,CL:2,CO2:2,GLUCOSE:2,BUN:2,CREATININE:2,CALCIUM:2,MG:2,PHOS:2 in the last 72 hours No results found for this basename: AST:2,ALT:2,ALKPHOS:2,BILITOT:2,PROT:2,ALBUMIN:2 in the last 72 hours No results found for this basename: LIPASE:2,AMYLASE:2 in the last 72 hours No results found for this basename: WBC:2,NEUTROABS:2,HGB:2,HCT:2,MCV:2,PLT:2 in the last 72 hours No results found for this basename: CKTOTAL:4,CKMB:4,TROPONINI:4 in the last 72 hours No components found with this basename: POCBNP:3 No results found for this basename: DDIMER in the last 72 hours No results found for this basename: HGBA1C in the last 72 hours No results found for this basename: CHOL,HDL,LDLCALC,TRIG,CHOLHDL in the last 72 hours No results found for this basename: TSH,T4TOTAL,FREET3,T3FREE,THYROIDAB in the last 72 hours No results found for this basename: VITAMINB12,FOLATE,FERRITIN,TIBC,IRON,RETICCTPCT in the last 72  hours  Medications: Scheduled    . amiodarone  400 mg Oral BID  . antiseptic oral rinse  15 mL Mouth Rinse QID  . chlorhexidine  15 mL Mouth Rinse BID  . digoxin  0.125 mg Oral Daily  . enoxaparin (LOVENOX) injection  40 mg Subcutaneous Q24H  . feeding supplement  237 mL Oral TID BM  . feeding supplement  1 Container Oral TID WC  . furosemide  40 mg Oral BID  . guaiFENesin  1,200 mg Oral BID  . ipratropium  0.5 mg Nebulization Q6H  . lactose free nutrition  237 mL Oral TID WC  . levalbuterol  0.63 mg Nebulization Q6H  . minocycline  100 mg Oral Q12H  . pantoprazole  40 mg Oral q morning - 10a  . potassium chloride  40 mEq Oral Daily  . sodium chloride  10-40 mL Intracatheter Q12H  . Tamsulosin HCl  0.4 mg Oral BID     Radiology/Studies:  No results found.  INR: Will add last result for INR, ABG once components are confirmed Will add last 4 CBG results once components are confirmed  Assessment/Plan: S/P Procedure(s) (LRB): VIDEO ASSISTED THORACOSCOPY (VATS)/EMPYEMA (Left) VIDEO BRONCHOSCOPY (Bilateral)  Appears to be clinically stable on current rx/tx- should be stable for D/C to SNF w/O2   LOS: 20 days    GOLD,[name] 1/6/20147:30 AM    Patient seen and examined. Agree with above. CXR pending I think he'll be ready for d/c when arrangements finalized

## 2012-08-08 NOTE — Discharge Summary (Signed)
[address]            [place] [address]          [phone]     Addendum for discharge summary:  Patient continues to make progress in his recovery. Currently he remains afebrile and hemodynamically stable and a controlled atrial fibrillation. He continues not to be a favorable candidate for Coumadin therapy. He continues his minocycline for a 6 week course completion. He also continues to require oxygen at this time. He is felt to be stable for transfer to the skilled nursing facility for ongoing rehabilitation and recovery.  His current medication list:   Medication List     As of 08/08/2012  7:57 AM    STOP taking these medications         amoxicillin-clavulanate 875-125 MG per tablet   Commonly known as: AUGMENTIN      etodolac 400 MG 24 hr tablet   Commonly known as: LODINE XL      guaiFENesin-dextromethorphan 100-10 MG/5ML syrup   Commonly known as: ROBITUSSIN DM      linezolid 600 MG tablet   Commonly known as: ZYVOX      magic mouthwash Soln      trospium 20 MG tablet   Commonly known as: SANCTURA      TAKE these medications         alum & mag hydroxide-simeth 200-200-20 MG/5ML suspension   Commonly known as: MAALOX/MYLANTA   Take 30 mLs by mouth every 6 (six) hours as needed. Stomach discomfort.      amiodarone 400 MG tablet   Commonly known as: PACERONE   Take 1 tablet (400 mg total) by mouth 2 (two) times daily. For 3 more days ;then take Amiodarone 400 mg po daily thereafter      AVODART 0.5 MG capsule   Generic drug: dutasteride   TAKE 1 CAPSULE EVERY DAY      digoxin 0.125 MG tablet   Commonly known as: LANOXIN   Take 1 tablet (0.125 mg total) by mouth daily.      furosemide 40 MG tablet   Commonly known as: LASIX   Take 1 tablet (40 mg total) by mouth 2 (two) times daily.      guaiFENesin 600 MG 12 hr tablet   Commonly known as: MUCINEX   Take 2 tablets (1,200 mg total) by mouth 2 (two) times daily. PRN  cough      ipratropium 0.02 % nebulizer solution   Commonly known as: ATROVENT   Take 2.5 mLs (0.5 mg total) by nebulization 3 (three) times daily.      levalbuterol 0.63 MG/3ML nebulizer solution   Commonly known as: XOPENEX   Take 3 mLs (0.63 mg total) by nebulization 3 (three) times daily.      minocycline 100 MG capsule   Commonly known as: MINOCIN,DYNACIN   Take 1 capsule (100 mg total) by mouth every 12 (twelve) hours.      oxyCODONE 5 MG immediate release tablet   Commonly known as: Oxy IR/ROXICODONE   Take 1 tablet (5 mg total) by mouth every 4 (four) hours as needed for pain.      pantoprazole 40 MG tablet   Commonly known as: PROTONIX   Take 1 tablet (40 mg total) by mouth 2 (two) times daily before a meal.      polyethylene glycol packet  Commonly known as: MIRALAX / GLYCOLAX   Take 17 g by mouth daily as needed (constipation).      potassium chloride SA 20 MEQ tablet   Commonly known as: K-DUR,KLOR-CON   Take 2 tablets (40 mEq total) by mouth daily.      Tamsulosin HCl 0.4 MG Caps   Commonly known as: FLOMAX   Take 0.4 mg by mouth 2 (two) times daily.            Please see previously dictated discharge summary for full details of this hospitalization.   Gershon Crane PA-C

## 2012-08-08 NOTE — Progress Notes (Signed)
Clinical Social Work  CSW met with patient and dtr at bedside to discuss bed offers. Dtr reports that patient is not ready to dc and they are waiting to talk with the MD and for a chest xray. CSW spoke about SNF plans at dc. Dtr is interested in [facility] and patient is agreeable to [facility] as well.   CSW faxed information to [facility] and will continue to follow to determine if they can extend an offer.  [place], [place] [phone] (Coverage)

## 2012-08-09 MED ORDER — PREDNISONE 10 MG PO TABS: ORAL_TABLET | ORAL | Status: AC

## 2012-08-09 MED ORDER — PREDNISONE 50 MG PO TABS
50.0000 mg | ORAL_TABLET | Freq: Every day | ORAL | Status: DC
  Administered 2012-08-09: 50 mg via ORAL
  Filled 2012-08-09 (×2): qty 1

## 2012-08-09 MED ORDER — ALTEPLASE 2 MG IJ SOLR
2.0000 mg | Freq: Once | INTRAMUSCULAR | Status: AC
  Administered 2012-08-09: 2 mg
  Filled 2012-08-09: qty 2

## 2012-08-09 NOTE — Progress Notes (Addendum)
[address]            [place] [address]          [phone]     19 Days Post-Op  Procedure(s) (LRB): VIDEO ASSISTED THORACOSCOPY (VATS)/EMPYEMA (Left) VIDEO BRONCHOSCOPY (Bilateral) Subjective: Feels ok  Objective  Telemetry afib with CVR  Temp:  [97.1 F (36.2 C)-97.8 F (36.6 C)] 97.4 F (36.3 C) (01/07 0428) Pulse Rate:  [63-81] 80  (01/07 0428) Resp:  [18-20] 18  (01/07 0428) BP: (102-107)/(42-66) 102/60 mmHg (01/07 0428) SpO2:  [91 %-97 %] 94 % (01/07 0428)   Intake/Output Summary (Last 24 hours) at 08/09/12 9147 Last data filed at 08/09/12 8295  Gross per 24 hour  Intake    600 ml  Output   1500 ml  Net   -900 ml       General appearance: alert, cooperative and no distress Heart: irregularly irregular rhythm Lungs: dim in bases Abdomen: soft, nontender Extremities: + edema Wound: incis OK  Lab Results: No results found for this basename: NA:2,K:2,CL:2,CO2:2,GLUCOSE:2,BUN:2,CREATININE:2,CALCIUM:2,MG:2,PHOS:2 in the last 72 hours No results found for this basename: AST:2,ALT:2,ALKPHOS:2,BILITOT:2,PROT:2,ALBUMIN:2 in the last 72 hours No results found for this basename: LIPASE:2,AMYLASE:2 in the last 72 hours No results found for this basename: WBC:2,NEUTROABS:2,HGB:2,HCT:2,MCV:2,PLT:2 in the last 72 hours No results found for this basename: CKTOTAL:4,CKMB:4,TROPONINI:4 in the last 72 hours No components found with this basename: POCBNP:3 No results found for this basename: DDIMER in the last 72 hours No results found for this basename: HGBA1C in the last 72 hours No results found for this basename: CHOL,HDL,LDLCALC,TRIG,CHOLHDL in the last 72 hours No results found for this basename: TSH,T4TOTAL,FREET3,T3FREE,THYROIDAB in the last 72 hours No results found for this basename: VITAMINB12,FOLATE,FERRITIN,TIBC,IRON,RETICCTPCT in the last 72 hours  Medications: Scheduled    . amiodarone  400 mg Oral BID  . antiseptic  oral rinse  15 mL Mouth Rinse QID  . chlorhexidine  15 mL Mouth Rinse BID  . digoxin  0.125 mg Oral Daily  . enoxaparin (LOVENOX) injection  40 mg Subcutaneous Q24H  . feeding supplement  237 mL Oral TID BM  . feeding supplement  1 Container Oral TID WC  . furosemide  40 mg Oral BID  . guaiFENesin  1,200 mg Oral BID  . ipratropium  0.5 mg Nebulization Q6H  . lactose free nutrition  237 mL Oral TID WC  . levalbuterol  0.63 mg Nebulization Q6H  . minocycline  100 mg Oral Q12H  . pantoprazole  40 mg Oral q morning - 10a  . potassium chloride  40 mEq Oral Daily  . sodium chloride  10-40 mL Intracatheter Q12H  . Tamsulosin HCl  0.4 mg Oral BID     Radiology/Studies:  Dg Chest 2 View  08/08/2012  *RADIOLOGY REPORT*  Clinical Data: Productive cough.  Follow-up.  CHEST - 2 VIEW  Comparison: 08/04/2012.  Findings: Right upper extremity PICC appears similar.  Median sternotomy.  Left pleural effusion and left lower lobe consolidation appears similar to prior. There is progressive accumulation of right pleural fluid with right basilar atelectasis. Cardiopericardial silhouette is obscured.  IMPRESSION:  1.  Unchanged right upper extremity PICC. 2.  Increasing right pleural fluid with associated atelectasis. 3.  Unchanged appearance of the left lung with left pleural effusion and collapse / consolidation at the left base/left lower lobe.   Original Report Authenticated By: Andreas Newport, M.D.  INR: Will add last result for INR, ABG once components are confirmed Will add last 4 CBG results once components are confirmed  Assessment/Plan: S/P Procedure(s) (LRB): VIDEO ASSISTED THORACOSCOPY (VATS)/EMPYEMA (Left) VIDEO BRONCHOSCOPY (Bilateral)  1. conts to make good clinical progress.  2 CXR is fairly stable and probably no new therapeutic intervention needed at this time 3 patient and family cont to be uncomfortable with patient transitioning out of acute care hospitalization to SNF  Addendum -  discussed with Dr Dorris Fetch and we will add a short prednisone taper 50/40/30/20/10mg  then stop. He feels he is stable for tx to SNF   LOS: 21 days    GOLD,[name] 1/7/20147:22 AM

## 2012-08-09 NOTE — Clinical Social Work Note (Signed)
CSW was consulted to complete discharge of patient. Pt to transfer to [facility] today via [facility]. Facility and family are aware of d/c. D/C packet complete with chart copy, signed FL2, and signed hard Rx.  CSW signing off as no other CSW needs identified at this time.  Lia Foyer, LCSWA [facility] Clinical Social Worker Contact #: [phone] (PRN)

## 2012-08-09 NOTE — Progress Notes (Signed)
PICC line removed per order. Line intact at 40 cm. Manual pressure held x minutes. No active bleeding noted. Vaseline gauze pressure dressing applied and secured. Patient and family teaching done.

## 2012-08-09 NOTE — Discharge Summary (Signed)
                   [address]            [place] [address]          [phone]    Patient remains stable for tx to SNF Predisone taper added(oral) for pleural effusion  50 mg today 40 mg 08/10/12 30 mg 08/11/12 20 mg 08/28/2012 10 mg 08/13/12 then stop     Please see other DC summaries for details of hospitalization  Gershon Crane PA-C

## 2012-08-12 ENCOUNTER — Emergency Department (HOSPITAL_COMMUNITY): Payer: Medicare Other

## 2012-08-12 ENCOUNTER — Encounter (HOSPITAL_COMMUNITY): Payer: Self-pay

## 2012-08-12 ENCOUNTER — Inpatient Hospital Stay (HOSPITAL_COMMUNITY)
Admission: EM | Admit: 2012-08-12 | Discharge: 2012-09-03 | DRG: 189 | Disposition: E | Payer: Medicare Other | Attending: Pulmonary Disease | Admitting: Pulmonary Disease

## 2012-08-12 DIAGNOSIS — K5900 Constipation, unspecified: Secondary | ICD-10-CM | POA: Diagnosis present

## 2012-08-12 DIAGNOSIS — R609 Edema, unspecified: Secondary | ICD-10-CM

## 2012-08-12 DIAGNOSIS — Z515 Encounter for palliative care: Secondary | ICD-10-CM

## 2012-08-12 DIAGNOSIS — J690 Pneumonitis due to inhalation of food and vomit: Secondary | ICD-10-CM

## 2012-08-12 DIAGNOSIS — Z85820 Personal history of malignant melanoma of skin: Secondary | ICD-10-CM

## 2012-08-12 DIAGNOSIS — I50812 Chronic right heart failure: Secondary | ICD-10-CM | POA: Diagnosis present

## 2012-08-12 DIAGNOSIS — J209 Acute bronchitis, unspecified: Secondary | ICD-10-CM

## 2012-08-12 DIAGNOSIS — Z87891 Personal history of nicotine dependence: Secondary | ICD-10-CM

## 2012-08-12 DIAGNOSIS — I272 Pulmonary hypertension, unspecified: Secondary | ICD-10-CM

## 2012-08-12 DIAGNOSIS — G4700 Insomnia, unspecified: Secondary | ICD-10-CM | POA: Diagnosis present

## 2012-08-12 DIAGNOSIS — I050 Rheumatic mitral stenosis: Secondary | ICD-10-CM

## 2012-08-12 DIAGNOSIS — I079 Rheumatic tricuspid valve disease, unspecified: Secondary | ICD-10-CM | POA: Diagnosis present

## 2012-08-12 DIAGNOSIS — I509 Heart failure, unspecified: Secondary | ICD-10-CM | POA: Diagnosis present

## 2012-08-12 DIAGNOSIS — R0600 Dyspnea, unspecified: Secondary | ICD-10-CM

## 2012-08-12 DIAGNOSIS — I2510 Atherosclerotic heart disease of native coronary artery without angina pectoris: Secondary | ICD-10-CM

## 2012-08-12 DIAGNOSIS — J986 Disorders of diaphragm: Secondary | ICD-10-CM

## 2012-08-12 DIAGNOSIS — J189 Pneumonia, unspecified organism: Secondary | ICD-10-CM

## 2012-08-12 DIAGNOSIS — J441 Chronic obstructive pulmonary disease with (acute) exacerbation: Secondary | ICD-10-CM

## 2012-08-12 DIAGNOSIS — Z79899 Other long term (current) drug therapy: Secondary | ICD-10-CM

## 2012-08-12 DIAGNOSIS — J90 Pleural effusion, not elsewhere classified: Secondary | ICD-10-CM

## 2012-08-12 DIAGNOSIS — I350 Nonrheumatic aortic (valve) stenosis: Secondary | ICD-10-CM

## 2012-08-12 DIAGNOSIS — R55 Syncope and collapse: Secondary | ICD-10-CM | POA: Diagnosis not present

## 2012-08-12 DIAGNOSIS — I4891 Unspecified atrial fibrillation: Secondary | ICD-10-CM

## 2012-08-12 DIAGNOSIS — Z9981 Dependence on supplemental oxygen: Secondary | ICD-10-CM

## 2012-08-12 DIAGNOSIS — N138 Other obstructive and reflux uropathy: Secondary | ICD-10-CM | POA: Diagnosis present

## 2012-08-12 DIAGNOSIS — J9811 Atelectasis: Secondary | ICD-10-CM

## 2012-08-12 DIAGNOSIS — J9620 Acute and chronic respiratory failure, unspecified whether with hypoxia or hypercapnia: Principal | ICD-10-CM | POA: Diagnosis present

## 2012-08-12 DIAGNOSIS — J850 Gangrene and necrosis of lung: Secondary | ICD-10-CM

## 2012-08-12 DIAGNOSIS — J4489 Other specified chronic obstructive pulmonary disease: Secondary | ICD-10-CM | POA: Diagnosis present

## 2012-08-12 DIAGNOSIS — I2789 Other specified pulmonary heart diseases: Secondary | ICD-10-CM

## 2012-08-12 DIAGNOSIS — I872 Venous insufficiency (chronic) (peripheral): Secondary | ICD-10-CM | POA: Diagnosis present

## 2012-08-12 DIAGNOSIS — J449 Chronic obstructive pulmonary disease, unspecified: Secondary | ICD-10-CM

## 2012-08-12 DIAGNOSIS — I129 Hypertensive chronic kidney disease with stage 1 through stage 4 chronic kidney disease, or unspecified chronic kidney disease: Secondary | ICD-10-CM | POA: Diagnosis present

## 2012-08-12 DIAGNOSIS — N401 Enlarged prostate with lower urinary tract symptoms: Secondary | ICD-10-CM | POA: Diagnosis present

## 2012-08-12 DIAGNOSIS — K219 Gastro-esophageal reflux disease without esophagitis: Secondary | ICD-10-CM

## 2012-08-12 DIAGNOSIS — N400 Enlarged prostate without lower urinary tract symptoms: Secondary | ICD-10-CM

## 2012-08-12 DIAGNOSIS — J869 Pyothorax without fistula: Secondary | ICD-10-CM

## 2012-08-12 DIAGNOSIS — E785 Hyperlipidemia, unspecified: Secondary | ICD-10-CM

## 2012-08-12 DIAGNOSIS — D649 Anemia, unspecified: Secondary | ICD-10-CM | POA: Diagnosis present

## 2012-08-12 DIAGNOSIS — I080 Rheumatic disorders of both mitral and aortic valves: Secondary | ICD-10-CM | POA: Diagnosis present

## 2012-08-12 DIAGNOSIS — N183 Chronic kidney disease, stage 3 unspecified: Secondary | ICD-10-CM | POA: Diagnosis present

## 2012-08-12 DIAGNOSIS — J9610 Chronic respiratory failure, unspecified whether with hypoxia or hypercapnia: Secondary | ICD-10-CM

## 2012-08-12 DIAGNOSIS — D72829 Elevated white blood cell count, unspecified: Secondary | ICD-10-CM | POA: Diagnosis present

## 2012-08-12 DIAGNOSIS — M1990 Unspecified osteoarthritis, unspecified site: Secondary | ICD-10-CM | POA: Diagnosis present

## 2012-08-12 DIAGNOSIS — R601 Generalized edema: Secondary | ICD-10-CM | POA: Diagnosis present

## 2012-08-12 DIAGNOSIS — I4510 Unspecified right bundle-branch block: Secondary | ICD-10-CM

## 2012-08-12 DIAGNOSIS — Z66 Do not resuscitate: Secondary | ICD-10-CM | POA: Diagnosis not present

## 2012-08-12 DIAGNOSIS — I38 Endocarditis, valve unspecified: Secondary | ICD-10-CM

## 2012-08-12 HISTORY — DX: Unspecified atrial fibrillation: I48.91

## 2012-08-12 LAB — CBC WITH DIFFERENTIAL/PLATELET
Basophils Absolute: 0.1 10*3/uL (ref 0.0–0.1)
Basophils Relative: 1 % (ref 0–1)
Eosinophils Absolute: 0.1 10*3/uL (ref 0.0–0.7)
Eosinophils Relative: 0 % (ref 0–5)
HCT: 25.2 % — ABNORMAL LOW (ref 39.0–52.0)
Hemoglobin: 8.2 g/dL — ABNORMAL LOW (ref 13.0–17.0)
Lymphocytes Relative: 7 % — ABNORMAL LOW (ref 12–46)
Lymphs Abs: 1 K/uL (ref 0.7–4.0)
MCH: 29.5 pg (ref 26.0–34.0)
MCHC: 32.5 g/dL (ref 30.0–36.0)
MCV: 90.6 fL (ref 78.0–100.0)
Monocytes Absolute: 0.9 K/uL (ref 0.1–1.0)
Monocytes Relative: 6 % (ref 3–12)
Neutro Abs: 13.3 K/uL — ABNORMAL HIGH (ref 1.7–7.7)
Neutrophils Relative %: 87 % — ABNORMAL HIGH (ref 43–77)
Platelets: 209 10*3/uL (ref 150–400)
RBC: 2.78 MIL/uL — ABNORMAL LOW (ref 4.22–5.81)
RDW: 14.8 % (ref 11.5–15.5)
WBC: 15.3 10*3/uL — ABNORMAL HIGH (ref 4.0–10.5)

## 2012-08-12 LAB — GLUCOSE, CAPILLARY: Glucose-Capillary: 124 mg/dL — ABNORMAL HIGH (ref 70–99)

## 2012-08-12 LAB — COMPREHENSIVE METABOLIC PANEL
ALT: 18 U/L (ref 0–53)
AST: 19 U/L (ref 0–37)
Calcium: 8.4 mg/dL (ref 8.4–10.5)
Sodium: 141 mEq/L (ref 135–145)
Total Protein: 5.1 g/dL — ABNORMAL LOW (ref 6.0–8.3)

## 2012-08-12 LAB — URINALYSIS, ROUTINE W REFLEX MICROSCOPIC
Bilirubin Urine: NEGATIVE
Glucose, UA: NEGATIVE mg/dL
Hgb urine dipstick: NEGATIVE
Ketones, ur: NEGATIVE mg/dL
Leukocytes, UA: NEGATIVE
Nitrite: NEGATIVE
Protein, ur: NEGATIVE mg/dL
Specific Gravity, Urine: 1.014 (ref 1.005–1.030)
Urobilinogen, UA: 0.2 mg/dL (ref 0.0–1.0)
pH: 5 (ref 5.0–8.0)

## 2012-08-12 LAB — POCT I-STAT TROPONIN I: Troponin i, poc: 0.05 ng/mL (ref 0.00–0.08)

## 2012-08-12 LAB — LACTIC ACID, PLASMA: Lactic Acid, Venous: 1.7 mmol/L (ref 0.5–2.2)

## 2012-08-12 LAB — COMPREHENSIVE METABOLIC PANEL WITH GFR
Albumin: 1.9 g/dL — ABNORMAL LOW (ref 3.5–5.2)
Alkaline Phosphatase: 92 U/L (ref 39–117)
BUN: 30 mg/dL — ABNORMAL HIGH (ref 6–23)
CO2: 34 meq/L — ABNORMAL HIGH (ref 19–32)
Chloride: 100 meq/L (ref 96–112)
Creatinine, Ser: 1.35 mg/dL (ref 0.50–1.35)
GFR calc Af Amer: 56 mL/min — ABNORMAL LOW (ref >90)
GFR calc non Af Amer: 49 mL/min — ABNORMAL LOW (ref >90)
Glucose, Bld: 139 mg/dL — ABNORMAL HIGH (ref 70–99)
Potassium: 4.1 meq/L (ref 3.5–5.1)
Total Bilirubin: 0.4 mg/dL (ref 0.3–1.2)

## 2012-08-12 LAB — OCCULT BLOOD, POC DEVICE: Fecal Occult Bld: NEGATIVE

## 2012-08-12 LAB — PROCALCITONIN: Procalcitonin: <0.1 ng/mL

## 2012-08-12 LAB — PRO B NATRIURETIC PEPTIDE: Pro B Natriuretic peptide (BNP): 3699 pg/mL — ABNORMAL HIGH (ref 0–450)

## 2012-08-12 MED ORDER — FUROSEMIDE 10 MG/ML IJ SOLN
40.0000 mg | Freq: Two times a day (BID) | INTRAMUSCULAR | Status: DC
  Filled 2012-08-12 (×3): qty 4

## 2012-08-12 MED ORDER — SODIUM CHLORIDE 0.9 % IJ SOLN: 3.0000 mL | INTRAMUSCULAR | Status: DC | PRN

## 2012-08-12 MED ORDER — SODIUM CHLORIDE 0.9 % IV BOLUS (SEPSIS)
500.0000 mL | Freq: Once | INTRAVENOUS | Status: AC
  Administered 2012-08-12: 500 mL via INTRAVENOUS

## 2012-08-12 MED ORDER — VANCOMYCIN HCL 10 G IV SOLR
1250.0000 mg | Freq: Once | INTRAVENOUS | Status: AC
  Administered 2012-08-13: 1250 mg via INTRAVENOUS
  Filled 2012-08-12: qty 1250

## 2012-08-12 MED ORDER — ALBUTEROL SULFATE (5 MG/ML) 0.5% IN NEBU
2.5000 mg | INHALATION_SOLUTION | RESPIRATORY_TRACT | Status: DC | PRN
  Administered 2012-08-13 (×2): 2.5 mg via RESPIRATORY_TRACT
  Filled 2012-08-12 (×2): qty 0.5

## 2012-08-12 MED ORDER — DUTASTERIDE 0.5 MG PO CAPS
0.5000 mg | ORAL_CAPSULE | Freq: Every day | ORAL | Status: DC
  Administered 2012-08-13 – 2012-08-14 (×2): 0.5 mg via ORAL
  Filled 2012-08-12 (×3): qty 1

## 2012-08-12 MED ORDER — ALBUTEROL SULFATE (5 MG/ML) 0.5% IN NEBU
2.5000 mg | INHALATION_SOLUTION | RESPIRATORY_TRACT | Status: DC | PRN
  Administered 2012-08-12: 2.5 mg via RESPIRATORY_TRACT
  Filled 2012-08-12: qty 1

## 2012-08-12 MED ORDER — TAMSULOSIN HCL 0.4 MG PO CAPS
0.4000 mg | ORAL_CAPSULE | Freq: Two times a day (BID) | ORAL | Status: DC
  Administered 2012-08-13 – 2012-08-14 (×5): 0.4 mg via ORAL
  Filled 2012-08-12 (×7): qty 1

## 2012-08-12 MED ORDER — SODIUM CHLORIDE 0.9 % IV SOLN: 250.0000 mL | INTRAVENOUS | Status: DC | PRN

## 2012-08-12 MED ORDER — HEPARIN SODIUM (PORCINE) 5000 UNIT/ML IJ SOLN
5000.0000 [IU] | Freq: Three times a day (TID) | INTRAMUSCULAR | Status: DC
  Administered 2012-08-13 – 2012-08-15 (×8): 5000 [IU] via SUBCUTANEOUS
  Filled 2012-08-12 (×14): qty 1

## 2012-08-12 MED ORDER — POLYETHYLENE GLYCOL 3350 17 G PO PACK
17.0000 g | PACK | Freq: Every day | ORAL | Status: DC | PRN
  Filled 2012-08-12: qty 1

## 2012-08-12 MED ORDER — ACETAMINOPHEN 325 MG PO TABS: 325.0000 mg | ORAL_TABLET | Freq: Four times a day (QID) | ORAL | Status: DC | PRN

## 2012-08-12 MED ORDER — ALBUTEROL SULFATE (5 MG/ML) 0.5% IN NEBU
INHALATION_SOLUTION | RESPIRATORY_TRACT | Status: AC
  Administered 2012-08-12: 2.5 mg via RESPIRATORY_TRACT
  Filled 2012-08-12: qty 0.5

## 2012-08-12 MED ORDER — IPRATROPIUM BROMIDE 0.02 % IN SOLN: 0.5000 mg | RESPIRATORY_TRACT | Status: DC | PRN

## 2012-08-12 MED ORDER — IPRATROPIUM BROMIDE HFA 17 MCG/ACT IN AERS
2.0000 | INHALATION_SPRAY | Freq: Four times a day (QID) | RESPIRATORY_TRACT | Status: DC
  Filled 2012-08-12: qty 12.9

## 2012-08-12 MED ORDER — MOMETASONE FURO-FORMOTEROL FUM 100-5 MCG/ACT IN AERO
2.0000 | INHALATION_SPRAY | Freq: Two times a day (BID) | RESPIRATORY_TRACT | Status: DC
  Administered 2012-08-13 – 2012-08-14 (×4): 2 via RESPIRATORY_TRACT
  Filled 2012-08-12: qty 8.8

## 2012-08-12 MED ORDER — ASPIRIN 300 MG RE SUPP
300.0000 mg | RECTAL | Status: DC
  Filled 2012-08-12: qty 1

## 2012-08-12 MED ORDER — PANTOPRAZOLE SODIUM 40 MG PO TBEC
40.0000 mg | DELAYED_RELEASE_TABLET | Freq: Two times a day (BID) | ORAL | Status: DC
  Administered 2012-08-13 – 2012-08-14 (×4): 40 mg via ORAL
  Filled 2012-08-12 (×4): qty 1

## 2012-08-12 MED ORDER — IPRATROPIUM BROMIDE 0.02 % IN SOLN
RESPIRATORY_TRACT | Status: AC
  Administered 2012-08-12: 0.5 mg
  Filled 2012-08-12: qty 2.5

## 2012-08-12 MED ORDER — ASPIRIN 81 MG PO CHEW: 324.0000 mg | CHEWABLE_TABLET | ORAL | Status: DC

## 2012-08-12 MED ORDER — IPRATROPIUM BROMIDE 0.02 % IN SOLN
0.5000 mg | RESPIRATORY_TRACT | Status: DC | PRN
  Administered 2012-08-13 (×2): 0.5 mg via RESPIRATORY_TRACT
  Filled 2012-08-12 (×2): qty 2.5

## 2012-08-12 MED ORDER — OXYCODONE HCL 5 MG PO TABS
5.0000 mg | ORAL_TABLET | ORAL | Status: DC | PRN
  Administered 2012-08-13 – 2012-08-14 (×4): 5 mg via ORAL
  Filled 2012-08-12 (×4): qty 1

## 2012-08-12 MED ORDER — SODIUM CHLORIDE 0.9 % IJ SOLN
3.0000 mL | Freq: Two times a day (BID) | INTRAMUSCULAR | Status: DC
  Administered 2012-08-12 – 2012-08-15 (×6): 3 mL via INTRAVENOUS

## 2012-08-12 MED ORDER — LEVOFLOXACIN IN D5W 750 MG/150ML IV SOLN
750.0000 mg | INTRAVENOUS | Status: AC
  Administered 2012-08-13 – 2012-08-14 (×3): 750 mg via INTRAVENOUS
  Filled 2012-08-12 (×3): qty 150

## 2012-08-12 MED ORDER — LEVALBUTEROL HCL 0.63 MG/3ML IN NEBU
0.6300 mg | INHALATION_SOLUTION | Freq: Four times a day (QID) | RESPIRATORY_TRACT | Status: DC
  Administered 2012-08-13 – 2012-08-15 (×7): 0.63 mg via RESPIRATORY_TRACT
  Filled 2012-08-12 (×18): qty 3

## 2012-08-12 MED ORDER — IPRATROPIUM BROMIDE 0.02 % IN SOLN
0.5000 mg | Freq: Four times a day (QID) | RESPIRATORY_TRACT | Status: DC
  Administered 2012-08-13 – 2012-08-15 (×7): 0.5 mg via RESPIRATORY_TRACT
  Filled 2012-08-12 (×10): qty 2.5

## 2012-08-12 MED ORDER — PROMETHAZINE HCL 25 MG PO TABS: 12.5000 mg | ORAL_TABLET | Freq: Four times a day (QID) | ORAL | Status: DC | PRN

## 2012-08-12 MED ORDER — AMIODARONE HCL 200 MG PO TABS
400.0000 mg | ORAL_TABLET | Freq: Every day | ORAL | Status: DC
  Administered 2012-08-13 – 2012-08-14 (×2): 400 mg via ORAL
  Filled 2012-08-12 (×3): qty 2

## 2012-08-12 MED ORDER — ONDANSETRON HCL 4 MG PO TABS: 4.0000 mg | ORAL_TABLET | Freq: Four times a day (QID) | ORAL | Status: DC | PRN

## 2012-08-12 MED ORDER — DIGOXIN 125 MCG PO TABS
0.1250 mg | ORAL_TABLET | Freq: Every day | ORAL | Status: DC
  Administered 2012-08-13 – 2012-08-14 (×2): 0.125 mg via ORAL
  Filled 2012-08-12 (×2): qty 1

## 2012-08-12 MED ORDER — DEXTROSE 5 % IV SOLN
1.0000 g | Freq: Three times a day (TID) | INTRAVENOUS | Status: DC
  Administered 2012-08-13 – 2012-08-14 (×7): 1 g via INTRAVENOUS
  Filled 2012-08-12 (×10): qty 1

## 2012-08-12 MED ORDER — POTASSIUM CHLORIDE CRYS ER 20 MEQ PO TBCR
40.0000 meq | EXTENDED_RELEASE_TABLET | Freq: Every day | ORAL | Status: DC
  Administered 2012-08-13 – 2012-08-14 (×2): 40 meq via ORAL
  Filled 2012-08-12 (×3): qty 2

## 2012-08-12 MED ORDER — VANCOMYCIN HCL 1000 MG IV SOLR
750.0000 mg | Freq: Two times a day (BID) | INTRAVENOUS | Status: DC
  Administered 2012-08-13 – 2012-08-14 (×4): 750 mg via INTRAVENOUS
  Filled 2012-08-12 (×6): qty 750

## 2012-08-12 NOTE — Progress Notes (Signed)
ANTIBIOTIC CONSULT NOTE - INITIAL  Pharmacy Consult for vancomycin x 8 days. Indication: R/o pneumonia  Allergies  Allergen Reactions  . Ivp Dye (Iodinated Diagnostic Agents)     Patient Measurements: Height: 6' (182.9 cm) Weight: 187 lb 9.8 oz (85.1 kg) (bedscale) IBW/kg (Calculated) : 77.6   Vital Signs: Temp: 97.9 F (36.6 C) (01/10 2213) Temp src: Oral (01/10 2213) BP: 121/70 mmHg (01/10 2213) Pulse Rate: 68  (01/10 2213) Intake/Output from previous day:   Intake/Output from this shift:    Labs:  Basename 08-26-12 1732  WBC 15.3*  HGB 8.2*  PLT 209  LABCREA --  CREATININE 1.35   Estimated Creatinine Clearance: 49.5 ml/min (by C-G formula based on Cr of 1.35). No results found for this basename: VANCOTROUGH:2,VANCOPEAK:2,VANCORANDOM:2,GENTTROUGH:2,GENTPEAK:2,GENTRANDOM:2,TOBRATROUGH:2,TOBRAPEAK:2,TOBRARND:2,AMIKACINPEAK:2,AMIKACINTROU:2,AMIKACIN:2, in the last 72 hours   Microbiology: Recent Results (from the past 720 hour(s))  BODY FLUID CULTURE     Status: Normal   Collection Time   07/20/12 11:24 AM      Component Value Range Status Comment   Specimen Description FLUID PLEURAL   Final    Special Requests NONE   Final    Gram Stain     Final    Value: NO WBC SEEN     NO ORGANISMS SEEN   Culture NO GROWTH 3 DAYS   Final    Report Status 07/23/2012 FINAL   Final   MRSA PCR SCREENING     Status: Abnormal   Collection Time   07/20/12  5:31 PM      Component Value Range Status Comment   MRSA by PCR POSITIVE (*) NEGATIVE Final   CULTURE, RESPIRATORY     Status: Normal   Collection Time   07/21/12 12:13 PM      Component Value Range Status Comment   Specimen Description BRONCHIAL WASHINGS   Final    Special Requests PATIENT ON FOLLOWING MAXIPIME,VANCOMYCIN   Final    Gram Stain     Final    Value: FEW WBC PRESENT, PREDOMINANTLY PMN     NO SQUAMOUS EPITHELIAL CELLS SEEN     NO ORGANISMS SEEN   Culture     Final    Value: FEW STENOTROPHOMONAS MALTOPHILIA      Note: TIGECYCLINE 4ug/mL  NO CSLI INTERPRETATION AVAILABLE   Report Status 07/27/2012 FINAL   Final    Organism ID, Bacteria STENOTROPHOMONAS MALTOPHILIA   Final   AFB CULTURE WITH SMEAR     Status: Normal (Preliminary result)   Collection Time   07/21/12 12:13 PM      Component Value Range Status Comment   Specimen Description BRONCHIAL WASHINGS   Final    Special Requests PATIENT ON FOLLOWING MAXIPIME,VANCOMYCIN   Final    ACID FAST SMEAR NO ACID FAST BACILLI SEEN   Final    Culture     Final    Value: CULTURE WILL BE EXAMINED FOR 6 WEEKS BEFORE ISSUING A FINAL REPORT   Report Status PENDING   Incomplete   FUNGUS CULTURE W SMEAR     Status: Normal (Preliminary result)   Collection Time   07/21/12 12:13 PM      Component Value Range Status Comment   Specimen Description BRONCHIAL WASHINGS   Final    Special Requests PATIENT ON FOLLOWING MAXIPIME,VANCOMYCIN   Final    Fungal Smear NO YEAST OR FUNGAL ELEMENTS SEEN   Final    Culture CULTURE IN PROGRESS FOR FOUR WEEKS   Final    Report Status PENDING  Incomplete   TISSUE CULTURE     Status: Normal   Collection Time   07/21/12  1:01 PM      Component Value Range Status Comment   Specimen Description TISSUE   Final    Special Requests LEFT PLEURAL PEEL PT ON MAXIPIME VANCOMYCIN   Final    Gram Stain     Final    Value: RARE WBC PRESENT,BOTH PMN AND MONONUCLEAR     NO ORGANISMS SEEN   Culture NO GROWTH 3 DAYS   Final    Report Status 07/24/2012 FINAL   Final   AFB CULTURE WITH SMEAR     Status: Normal (Preliminary result)   Collection Time   07/21/12  1:01 PM      Component Value Range Status Comment   Specimen Description TISSUE   Final    Special Requests LEFT PLEURAL PEEL PT ON MAXIPIME VANCOMYCIN   Final    ACID FAST SMEAR NO ACID FAST BACILLI SEEN   Final    Culture     Final    Value: CULTURE WILL BE EXAMINED FOR 6 WEEKS BEFORE ISSUING A FINAL REPORT   Report Status PENDING   Incomplete   FUNGUS CULTURE W SMEAR      Status: Normal (Preliminary result)   Collection Time   07/21/12  1:01 PM      Component Value Range Status Comment   Specimen Description TISSUE   Final    Special Requests LEFT PLEURAL PEEL PT ON MAXIPIME VANCOMYCIN   Final    Fungal Smear NO YEAST OR FUNGAL ELEMENTS SEEN   Final    Culture CULTURE IN PROGRESS FOR FOUR WEEKS   Final    Report Status PENDING   Incomplete   AFB CULTURE WITH SMEAR     Status: Normal (Preliminary result)   Collection Time   07/21/12  1:01 PM      Component Value Range Status Comment   Specimen Description FLUID PLEURAL LEFT   Final    Special Requests PATIENT ON FOLLOWING MAXIPIME,VANCOMYCIN   Final    ACID FAST SMEAR NO ACID FAST BACILLI SEEN   Final    Culture     Final    Value: CULTURE WILL BE EXAMINED FOR 6 WEEKS BEFORE ISSUING A FINAL REPORT   Report Status PENDING   Incomplete   FUNGUS CULTURE W SMEAR     Status: Normal (Preliminary result)   Collection Time   07/21/12  1:01 PM      Component Value Range Status Comment   Specimen Description FLUID PLEURAL LEFT   Final    Special Requests PATIENT ON FOLLOWING MAXIPIME,VANCOMYCIN   Final    Fungal Smear NO YEAST OR FUNGAL ELEMENTS SEEN   Final    Culture CULTURE IN PROGRESS FOR FOUR WEEKS   Final    Report Status PENDING   Incomplete   BODY FLUID CULTURE     Status: Normal   Collection Time   07/21/12  1:01 PM      Component Value Range Status Comment   Specimen Description FLUID LEFT PLEURAL   Final    Special Requests PT ON MAXIPIME VANCOMYCIN   Final    Gram Stain     Final    Value: RARE WBC PRESENT,BOTH PMN AND MONONUCLEAR     NO ORGANISMS SEEN   Culture NO GROWTH 3 DAYS   Final    Report Status 07/24/2012 FINAL   Final   BODY FLUID CULTURE  Status: Normal   Collection Time   08/02/12  2:20 PM      Component Value Range Status Comment   Specimen Description FLUID PLEURAL RIGHT   Final    Special Requests SYRINGE @ 60CC   Final    Gram Stain     Final    Value: WBC PRESENT,BOTH  PMN AND MONONUCLEAR     NO ORGANISMS SEEN   Culture NO GROWTH 3 DAYS   Final    Report Status 08/06/2012 FINAL   Final     Medical History: Past Medical History  Diagnosis Date  . Allergic rhinitis   . COPD (chronic obstructive pulmonary disease)   . Disorders of diaphragm   . Acute bronchitis   . Hypertension   . CAD (coronary artery disease)     Catheterization 2009, mild coronary disease (after abnormal nuclear study,With hypotensive response to exercise, 2009)  . RBBB   . Atrial flutter     Ablated in the past, no recurrence  . Venous insufficiency   . Edema   . Dyslipidemia     Patient has an and to use statin  . GERD (gastroesophageal reflux disease)   . Diverticulosis of colon   . Hypertrophy of prostate with urinary obstruction and other lower urinary tract symptoms (LUTS)   . Increased prostate specific antigen (PSA) velocity   . DJD (degenerative joint disease)   . Chronic insomnia   . Malignant melanoma     Removed from mediastinum via thoracotomy July, 2010  . Ejection fraction     EF 60%,Echo, 2009,  /  EF 55-60%, echo, February, 2013  . Aortic stenosis     Mild, echo and catheterization, 2009 /  Mild, echo, February, 2013  . Mitral stenosis     Very mild, echo, from mitral annular calcification  . Shortness of breath     Episodes of feeling shortness of breath with some mild dizziness with mild exertion., February, 2013  . Tricuspid regurgitation     Moderate, echo, February, 2013, 56 mmHg  . Pulmonary hypertension     56 mmHg, echo, February, 2013  . Pneumonia     Medications:  Scheduled:    . aspirin  324 mg Oral NOW   Or  . aspirin  300 mg Rectal NOW  . ceFEPime (MAXIPIME) IV  1 g Intravenous Q8H  . furosemide  40 mg Intramuscular BID  . heparin  5,000 Units Subcutaneous Q8H  . ipratropium  2 puff Inhalation Q6H  . [COMPLETED] ipratropium      . levalbuterol  0.63 mg Nebulization Q6H  . levofloxacin (LEVAQUIN) IV  750 mg Intravenous Q24H  .  [COMPLETED] sodium chloride  500 mL Intravenous Once  . sodium chloride  3 mL Intravenous Q12H   Assessment: 79 yo male admitted with r/o pneumonia. Pharmacy to manage vancomycin. Estimated CrCl 50 ml/min. Patient is also to receive cefepime and levofloxacin.   Goal of Therapy:  Vancomycin trough level 15-20 mcg/ml  Plan:  1. Vancomycin 1.25gm IV x 1, then 750mg  Q12H.   Emeline Gins 09/03/2012,10:34 PM

## 2012-08-12 NOTE — H&P (Signed)
PULMONARY  / CRITICAL CARE MEDICINE  Name: Christian [name] MRN: [id number] DOB: 23-Mar-1932    LOS: 0  REFERRING MD :  EDP  CHIEF COMPLAINT:  Shortness of breath  BRIEF PATIENT DESCRIPTION: 79 year old male with Pulmonary HTN, RHF, chronic oxygen dependent COPD (3 L) and recent left VATS with decortication 2/2 loculated pleural effusion admitted 08/11/2012 2/2 acute on chronic respiratory failure.  LINES / TUBES:  CULTURES: 1/10 Blood x2>>> 1/10 Sputum>>>  ANTIBIOTICS: Vancomycin 1/10>>> Cefepime 1/10>>> Levofloxacin 1/10>>>  SIGNIFICANT EVENTS:  1/10 Admitted to SDU for acute on chronic respiratory failure  LEVEL OF CARE:  SDU PRIMARY SERVICE:  PCCM CONSULTANTS:  CODE STATUS: FULL DIET:  Heart Healthy DVT Px:  Heparin GI Px:  Protonix  HISTORY OF PRESENT ILLNESS:  79 year old white male with PMH of Pulmonary HTN, COPD, RHF, and Afib recently discharged from [facility] on 08/09/12 for necrotizing MRSA PNA and L pleural effusion and empyema s/p VATS with decortication 07/21/12 presenting on 08/26/2012 from NH for worsening shortness of breath and edema.  Mr. Christian [name] states around 10am this morning he started having difficulty breathing with increased effort throughout the day.  Labs drawn at [facility] showed leukocytosis and Tmax 99.18F.  EMS was called and he was noted to have brief episode of unresponsiveness and seizure like activity when loaded on stretcher.  He was bagged and HR dropped to 30s, paced briefly with return of HR back up to 50-100bpm.   He also notes having a chronic productive cough with gray-brown sputum and worsening lower extremity edema.   PAST MEDICAL HISTORY :  Past Medical History  Diagnosis Date  . Allergic rhinitis   . COPD (chronic obstructive pulmonary disease)   . Disorders of diaphragm   . Acute bronchitis   . Hypertension   . CAD (coronary artery disease)     Catheterization 2009, mild coronary disease (after abnormal nuclear study,With hypotensive response to  exercise, 2009)  . RBBB   . Atrial flutter     Ablated in the past, no recurrence  . Venous insufficiency   . Edema   . Dyslipidemia     Patient has an and to use statin  . GERD (gastroesophageal reflux disease)   . Diverticulosis of colon   . Hypertrophy of prostate with urinary obstruction and other lower urinary tract symptoms (LUTS)   . Increased prostate specific antigen (PSA) velocity   . DJD (degenerative joint disease)   . Chronic insomnia   . Malignant melanoma     Removed from mediastinum via thoracotomy July, 2010  . Ejection fraction     EF 60%,Echo, 2009,  /  EF 55-60%, echo, February, 2013  . Aortic stenosis     Mild, echo and catheterization, 2009 /  Mild, echo, February, 2013  . Mitral stenosis     Very mild, echo, from mitral annular calcification  . Shortness of breath     Episodes of feeling shortness of breath with some mild dizziness with mild exertion., February, 2013  . Tricuspid regurgitation     Moderate, echo, February, 2013, 56 mmHg  . Pulmonary hypertension     56 mmHg, echo, February, 2013  . Pneumonia    Past Surgical History  Procedure Date  . Bilateral inguinal hernia repair   . Umbilical hernia repair   . Median sternotomy for mediastinal melanoma 1980s  . Video assisted thoracoscopy (vats)/empyema 07/21/2012    Procedure: VIDEO ASSISTED THORACOSCOPY (VATS)/EMPYEMA;  Surgeon: Loreli Slot, MD;  Location: [facility] OR;  Service: Thoracic;  Laterality: Left;  Left video assisted thorascopy for empyema and decortication   . Video bronchoscopy 07/21/2012    Procedure: VIDEO BRONCHOSCOPY;  Surgeon: Loreli Slot, MD;  Location: [facility] OR;  Service: Thoracic;  Laterality: Bilateral;   Prior to Admission medications   Medication Sig Start Date End Date Taking? Authorizing Provider  alum & mag hydroxide-simeth (MAALOX/MYLANTA) 200-200-20 MG/5ML suspension Take 30 mLs by mouth every 6 (six) hours as needed. Stomach discomfort. 07/12/12  Yes  Ripudeep Jenna Luo, MD  amiodarone (PACERONE) 400 MG tablet Take 1 tablet (400 mg total) by mouth 2 (two) times daily. For 3 more days ;then take Amiodarone 400 mg po daily thereafter 08/08/12  Yes Rowe Clack, PA  AVODART 0.5 MG capsule TAKE 1 CAPSULE EVERY DAY 07/15/11  Yes Michele Mcalpine, MD  digoxin (LANOXIN) 0.125 MG tablet Take 1 tablet (0.125 mg total) by mouth daily. 08/04/12  Yes Donielle Margaretann Loveless, PA  furosemide (LASIX) 40 MG tablet Take 1 tablet (40 mg total) by mouth 2 (two) times daily. 08/08/12  Yes Rowe Clack, PA  guaiFENesin (MUCINEX) 600 MG 12 hr tablet Take 2 tablets (1,200 mg total) by mouth 2 (two) times daily. PRN cough 08/04/12  Yes Donielle Margaretann Loveless, PA  ipratropium (ATROVENT) 0.02 % nebulizer solution Take 2.5 mLs (0.5 mg total) by nebulization 3 (three) times daily. 08/04/12  Yes Donielle Margaretann Loveless, PA  levalbuterol (XOPENEX) 0.63 MG/3ML nebulizer solution Take 3 mLs (0.63 mg total) by nebulization 3 (three) times daily. 07/12/12  Yes Ripudeep Jenna Luo, MD  minocycline (MINOCIN,DYNACIN) 100 MG capsule Take 1 capsule (100 mg total) by mouth every 12 (twelve) hours. 08/08/12 09/09/12 Yes [name] Gold, PA  oxyCODONE (OXY IR/ROXICODONE) 5 MG immediate release tablet Take 1 tablet (5 mg total) by mouth every 4 (four) hours as needed for pain. 08/04/12  Yes Donielle Margaretann Loveless, PA  pantoprazole (PROTONIX) 40 MG tablet Take 1 tablet (40 mg total) by mouth 2 (two) times daily before a meal. 07/12/12  Yes Ripudeep K Rai, MD  polyethylene glycol (MIRALAX / GLYCOLAX) packet Take 17 g by mouth daily as needed (constipation). 07/12/12  Yes Ripudeep Jenna Luo, MD  potassium chloride SA (K-DUR,KLOR-CON) 20 MEQ tablet Take 2 tablets (40 mEq total) by mouth daily. 08/08/12  Yes Rowe Clack, PA  predniSONE (DELTASONE) 10 MG tablet Taper as written 08/09/12  Yes Rowe Clack, PA  Tamsulosin HCl (FLOMAX) 0.4 MG CAPS Take 0.4 mg by mouth 2 (two) times daily.    Yes Historical Provider, MD   Allergies  Allergen  Reactions  . Ivp Dye (Iodinated Diagnostic Agents)    FAMILY HISTORY:  History reviewed. No pertinent family history. SOCIAL HISTORY:  reports that he quit smoking about 52 years ago. His smoking use included Cigarettes. He quit after 11 years of use. He has never used smokeless tobacco. He reports that he does not drink alcohol or use illicit drugs.  Review of Systems:  Constitutional:  Decreased appetite. Denies fever, chills, diaphoresis, and fatigue.   HEENT:  Denies congestion, sore throat, rhinorrhea, sneezing, mouth sores, trouble swallowing, neck pain   Respiratory:  SOB, DOE, Productive Cough  Cardiovascular:  Afib, pulmonary HTN, pitting lower extremity edema  Gastrointestinal:  Denies nausea, vomiting, abdominal pain, diarrhea, constipation, blood in stool and abdominal distention.   Genitourinary:  BPH. Denies dysuria, urgency, frequency, hematuria, flank pain.  Musculoskeletal:  Denies myalgias, back pain, joint swelling,  arthralgias and gait problem.   Skin:  Sacral  Ulcer.   Neurological:  Syncope, light-headedness, seizure like activity?, weakness, headaches.  Denies dizziness and numbness.   INTERVAL HISTORY:  1/10 Admitted for acute on chronic respiratory failure s/p recent discharge from [facility] 08/09/12 for necrotizing PNA s/p VATS.   VITAL SIGNS: Temp:  [98.5 F (36.9 C)-99.1 F (37.3 C)] 99.1 F (37.3 C) (01/10 1821) Pulse Rate:  [66-76] 68  (01/10 2100) Resp:  [16-30] 26  (01/10 2100) BP: (104-122)/(38-56) 122/52 mmHg (01/10 2100) SpO2:  [93 %-99 %] 99 % (01/10 2100) HEMODYNAMICS:   VENTILATOR SETTINGS:   INTAKE / OUTPUT: Intake/Output    None    PHYSICAL EXAMINATION: General:  Resting in bed, NAD Neuro:  AAOX3, memory and sensation grossly intact HEENT:  PERRLA, EOMI Cardiovascular:  Irregularly Irregular Lungs:  Coarse b/l breath sounds Abdomen:  +bs, non-tender, distended, soft, pitting edema Musculoskeletal:  +3 pitting edema extending to abdomen,  +2DP b/l Skin:  Warm, moist, edema, + stage 1 developing decubitus ulcer, erythema, 4 (3L and 1R) gluteal cleft ulcers--stage II. Left lateral chest wall surgical scars x2 healing without drainage, mild erythema.   LABS: Cbc  Lab 08/30/2012 1732  WBC 15.3*  HGB 8.2*  HCT 25.2*  PLT 209   Chemistry  Lab 08/09/2012 1732  NA 141  K 4.1  CL 100  CO2 34*  BUN 30*  CREATININE 1.35  CALCIUM 8.4  MG --  PHOS --  GLUCOSE 139*   Liver fxn  Lab 08/11/2012 1732  AST 19  ALT 18  ALKPHOS 92  BILITOT 0.4  PROT 5.1*  ALBUMIN 1.9*   coags No results found for this basename: APTT:3,INR:3 in the last 168 hours Sepsis markers No results found for this basename: LATICACIDVEN:3,PROCALCITON:3 in the last 168 hours Cardiac markers No results found for this basename: CKTOTAL:3,CKMB:3,TROPONINI:3 in the last 168 hours BNP  Lab 08/26/2012 1733  PROBNP 3699.0*   ABG No results found for this basename: PHART:3,PCO2ART:3,PO2ART:3,HCO3:3,TCO2:3 in the last 168 hours  CBG trend  Lab 08/11/2012 1722  GLUCAP 124*   IMAGING: 1/10 PCXR: Persistent b/l effusions  ECG: HR 80bpm, Atrial fibrillation, RBBB  DIAGNOSES: Principal Problem:  *Acute on Chronic right-sided heart failure Active Problems:  Pleural effusion  Atrial fibrillation  Chronic respiratory failure  COPD (chronic obstructive pulmonary disease)  CAD (coronary artery disease)  RBBB  Pulmonary hypertension  GERD (gastroesophageal reflux disease)  HCAP (healthcare-associated pneumonia)  Leukocytosis  Syncope  Anasarca  ASSESSMENT / PLAN:  PULMONARY  ASSESSMENT: 1) Acute on chronic respiratory failure in setting of o2 dependent COPD, RHF, and Pulmonary Hypertension.  Modified geneva score of 3 with low probability of PE at this time 2) COPD--o2 dependent [place] 3L, on atrovent and xoponex nebz at home, not on inhaled corticosteroid or long acting cholinergic  3) HCAP-- in setting of chronic illness, recent prolonged  hospitalization course and current residence in NH 4) B/L pleural effusions---persistent in setting of recent necrotizing MRSA PNA s/p VATS with decortication 07/21/12 on chronic abx  PLAN:   Atrovent and Xopenx nebz Q6H and PRN Vancomycin, Cefepime, Levofloxacin Continuous pulse oximetry, keep o2 sat >92% Oxygen therapy [place] 3L AM cxr 2 view [place] Consider starting Spiriva at d/c See ID Trend WBC Consider VQ scan   CARDIOVASCULAR  ASSESSMENT: 1) Pulmonary HTN--peak pressure per Echo 09/2011 2) Syncope--in setting of pulmonary HTN 3) R heart failure--on digoxin (supratherapeutic 07/2012 Dig level 2)  Echo 09/2011: EF 55-60% 4) CAD--cath,  nuclear scan 2009: mild lateral ischemia  5) Atrial Fibrillation--on amiodarone, not candidate for anticoagulation   PLAN:  IV Lasix 40mg  BID Daily weights Strict I/O MAP goal >65 Fluid restriction 1200cc 2D Echo in AM AM EKG Cycle CE Oxygen therapy [place] 3L, continuous pulse oximetry, keep o2 sat >92 Consider R heart Cath once he is closer to his dry weight. TSH Digoxin level Continue Amiodorone and Digoxin ASA  RENAL  ASSESSMENT:  1) CKD stage III--baseline Cr ~1-1.2.  PLAN:   Trend BMP Strict I/O  GASTROINTESTINAL  ASSESSMENT:  1) GERD 2) Constipation PLAN:   Protonix Miralax PRN  HEMATOLOGIC  ASSESSMENT:  Acute anemia--possibly dilutional in setting of volume overload.  S/P VATS with decortication 12/19.  Baseline Hb ~14. FOBT negative.  PLAN:  Trend cbc  INFECTIOUS  ASSESSMENT:  1) HCAP--afebrile, wbc 15.3 on admission with Neut #13.3, worsening SOB with b/l pleural effusion  2) B/L pleural effusions--recent hx of recent necrotizing MRSA PNA s/p VATS on 6 week course of Minocycline (started 12/27) PLAN:   Blood cx x2 Sputum cx IV abx: Vancomycin, Cefepime, Levaquin Urine Ag Legionella and Strep pneumniae F/u lactic acid and procalcitonin  ENDOCRINE  ASSESSMENT: No acute issues     PLAN:   None  NEUROLOGIC  ASSESSMENT:  Syncope with questionable seizure activity per witnesses? PLAN:   See cardiovascular Consider neuro referral if concern for seizure  CLINICAL SUMMARY: 79 year old male with Pulmonary HTN, RHF, COPD, s/p recent left VATS with decortication 2/2 loculated pleural effusion admitted 08/28/2012 2/2 acute on chronic respiratory failure.  Signed: Darden Palmer, MD PGY-I, Internal Medicine Resident Pager: [phone] (7PM-7AM) 08/07/2012,10:19 PM  I have personally obtained a history, examined the patient, evaluated laboratory and imaging results, formulated the assessment and plan and placed orders. CRITICAL CARE: The patient is critically ill with multiple organ systems failure and requires high complexity decision making for assessment and support, frequent evaluation and titration of therapies, application of advanced monitoring technologies and extensive interpretation of multiple databases. Critical Care Time devoted to patient care services described in this note is 60 minutes.   Overton Mam, MD Pulmonary and Critical Care Medicine [facility] Pager: [phone]  08/27/2012, 9:13 PM

## 2012-08-12 NOTE — ED Notes (Addendum)
EMS reports called for SOB, then went unresponsive, bagged and pulse dropped to 30s, paced for short time and now heart rate 50-100, with atrial fib, seizure like activity on the ambulance, patient DNR, CBG 138

## 2012-08-12 NOTE — ED Provider Notes (Signed)
History     CSN: [id number]  Arrival date & time   1701   None     Chief Complaint  Patient presents with  . Shortness of Breath    (Consider location/radiation/quality/duration/timing/severity/associated sxs/prior treatment) HPI chief complaint: Shortness of breath. Onset: This morning. Location: Home. Severity: Moderate. Improving. Context: The patient was recently discharged from this hospital. For signs and symptoms the review of systems. For social history see nurse's notes. I have reviewed patient's past medical, past surgical, social history as well as medications and allergies. Past Medical History  Diagnosis Date  . Allergic rhinitis   . COPD (chronic obstructive pulmonary disease)   . Disorders of diaphragm   . Acute bronchitis   . Hypertension   . CAD (coronary artery disease)     Catheterization 2009, mild coronary disease (after abnormal nuclear study,With hypotensive response to exercise, 2009)  . RBBB   . Atrial flutter     Ablated in the past, no recurrence  . Venous insufficiency   . Edema   . Dyslipidemia     Patient has an and to use statin  . GERD (gastroesophageal reflux disease)   . Diverticulosis of colon   . Hypertrophy of prostate with urinary obstruction and other lower urinary tract symptoms (LUTS)   . Increased prostate specific antigen (PSA) velocity   . DJD (degenerative joint disease)   . Chronic insomnia   . Malignant melanoma     Removed from mediastinum via thoracotomy July, 2010  . Ejection fraction     EF 60%,Echo, 2009,  /  EF 55-60%, echo, February, 2013  . Aortic stenosis     Mild, echo and catheterization, 2009 /  Mild, echo, February, 2013  . Mitral stenosis     Very mild, echo, from mitral annular calcification  . Shortness of breath     Episodes of feeling shortness of breath with some mild dizziness with mild exertion., February, 2013  . Tricuspid regurgitation     Moderate, echo, February, 2013, 56 mmHg  .  Pulmonary hypertension     56 mmHg, echo, February, 2013  . Pneumonia     Past Surgical History  Procedure Date  . Bilateral inguinal hernia repair   . Umbilical hernia repair   . Median sternotomy for mediastinal melanoma 1980s  . Video assisted thoracoscopy (vats)/empyema 07/21/2012    Procedure: VIDEO ASSISTED THORACOSCOPY (VATS)/EMPYEMA;  Surgeon: Loreli Slot, MD;  Location: [facility] OR;  Service: Thoracic;  Laterality: Left;  Left video assisted thorascopy for empyema and decortication   . Video bronchoscopy 07/21/2012    Procedure: VIDEO BRONCHOSCOPY;  Surgeon: Loreli Slot, MD;  Location[facility] OR;  Service: Thoracic;  Laterality: Bilateral;    History reviewed. No pertinent family history.  History  Substance Use Topics  . Smoking status: Former Smoker    Quit date: 08/03/1960  . Smokeless tobacco: Never Used  . Alcohol Use: No      Review of Systems  Constitutional: Negative for fever and chills.  HENT: Negative for neck pain and neck stiffness.   Eyes: Negative for visual disturbance.  Respiratory: Positive for shortness of breath. Negative for cough and chest tightness.   Cardiovascular: Negative for chest pain, palpitations and leg swelling.  Gastrointestinal: Negative for nausea, vomiting, abdominal pain, diarrhea, constipation, blood in stool and abdominal distention.  Genitourinary: Negative for dysuria, urgency, hematuria and difficulty urinating.  Musculoskeletal: Negative for back pain and gait problem.  Skin: Negative for rash.  Neurological: Negative  for dizziness, tremors, seizures, syncope, facial asymmetry, speech difficulty, weakness, light-headedness, numbness and headaches.       Possible seizure-like episode with loss of consciousness.  Hematological: Negative for adenopathy. Does not bruise/bleed easily.  Psychiatric/Behavioral: Negative for confusion.    Allergies  Ivp dye  Home Medications   Current Outpatient Rx  Name  Route   Sig  Dispense  Refill  . ALUM & MAG HYDROXIDE-SIMETH 200-200-20 MG/5ML PO SUSP   Oral   Take 30 mLs by mouth every 6 (six) hours as needed. Stomach discomfort.         . AMIODARONE HCL 400 MG PO TABS   Oral   Take 1 tablet (400 mg total) by mouth 2 (two) times daily. For 3 more days ;then take Amiodarone 400 mg po daily thereafter         . AVODART 0.5 MG PO CAPS      TAKE 1 CAPSULE EVERY DAY   30 capsule   10   . DIGOXIN 0.125 MG PO TABS   Oral   Take 1 tablet (0.125 mg total) by mouth daily.         . FUROSEMIDE 40 MG PO TABS   Oral   Take 1 tablet (40 mg total) by mouth 2 (two) times daily.   30 tablet      . GUAIFENESIN ER 600 MG PO TB12   Oral   Take 2 tablets (1,200 mg total) by mouth 2 (two) times daily. PRN cough         . IPRATROPIUM BROMIDE 0.02 % IN SOLN   Nebulization   Take 2.5 mLs (0.5 mg total) by nebulization 3 (three) times daily.   75 mL      . LEVALBUTEROL HCL 0.63 MG/3ML IN NEBU   Nebulization   Take 3 mLs (0.63 mg total) by nebulization 3 (three) times daily.   3 mL      . MINOCYCLINE HCL 100 MG PO CAPS   Oral   Take 1 capsule (100 mg total) by mouth every 12 (twelve) hours.   58 capsule   0   . OXYCODONE HCL 5 MG PO TABS   Oral   Take 1 tablet (5 mg total) by mouth every 4 (four) hours as needed for pain.   30 tablet   0   . PANTOPRAZOLE SODIUM 40 MG PO TBEC   Oral   Take 1 tablet (40 mg total) by mouth 2 (two) times daily before a meal.         . POLYETHYLENE GLYCOL 3350 PO PACK   Oral   Take 17 g by mouth daily as needed (constipation).   14 each      . POTASSIUM CHLORIDE CRYS ER 20 MEQ PO TBCR   Oral   Take 2 tablets (40 mEq total) by mouth daily.         Marland Kitchen PREDNISONE 10 MG PO TABS      Taper as written   10 tablet   0   . TAMSULOSIN HCL 0.4 MG PO CAPS   Oral   Take 0.4 mg by mouth 2 (two) times daily.            There were no vitals taken for this visit.  Physical Exam  Constitutional: He is  oriented to person, place, and time. He appears well-developed and well-nourished. No distress.  HENT:  Head: Normocephalic.  Eyes: Conjunctivae normal are normal.  Neck: Normal range of motion. Neck supple.  Minimal JVD.  Cardiovascular: Normal rate, regular rhythm, normal heart sounds and intact distal pulses.   No murmur heard. Pulmonary/Chest: No accessory muscle usage. Tachypnea noted. No respiratory distress. He has decreased breath sounds in the right upper field, the right middle field, the right lower field, the left upper field, the left middle field and the left lower field. He has no wheezes. He has no rhonchi. He has no rales. He exhibits no tenderness.       Mild tachypnea with diffusely diminished breath sounds.  Abdominal: Soft. Bowel sounds are normal. He exhibits no distension. There is no tenderness.  Musculoskeletal: Normal range of motion. He exhibits no edema and no tenderness.  Neurological: He is alert and oriented to person, place, and time.  Skin: Skin is warm and dry. He is not diaphoretic.  Psychiatric: He has a normal mood and affect.    ED Course  Procedures (including critical care time)  Labs Reviewed  CBC WITH DIFFERENTIAL - Abnormal; Notable for the following:    WBC 15.3 (*)     RBC 2.78 (*)     Hemoglobin 8.2 (*)     HCT 25.2 (*)     Neutrophils Relative 87 (*)     Neutro Abs 13.3 (*)     Lymphocytes Relative 7 (*)     All other components within normal limits  COMPREHENSIVE METABOLIC PANEL - Abnormal; Notable for the following:    CO2 34 (*)     Glucose, Bld 139 (*)     BUN 30 (*)     Total Protein 5.1 (*)     Albumin 1.9 (*)     GFR calc non Af Amer 49 (*)     GFR calc Af Amer 56 (*)     All other components within normal limits  PRO B NATRIURETIC PEPTIDE - Abnormal; Notable for the following:    Pro B Natriuretic peptide (BNP) 3699.0 (*)     All other components within normal limits  GLUCOSE, CAPILLARY - Abnormal; Notable for the  following:    Glucose-Capillary 124 (*)     All other components within normal limits  URINALYSIS, ROUTINE W REFLEX MICROSCOPIC  POCT I-STAT TROPONIN I  LACTIC ACID, PLASMA  PROCALCITONIN  OCCULT BLOOD, POC DEVICE  TROPONIN I  INFLUENZA PANEL BY PCR  CULTURE, BLOOD (ROUTINE X 2)  CULTURE, BLOOD (ROUTINE X 2)  TROPONIN I  PRO B NATRIURETIC PEPTIDE  MAGNESIUM  BASIC METABOLIC PANEL  LEGIONELLA ANTIGEN, URINE  STREP PNEUMONIAE URINARY ANTIGEN  MICROALBUMIN / CREATININE URINE RATIO  CBC WITH DIFFERENTIAL  DIGOXIN LEVEL  TSH  TROPONIN I   Dg Chest Port 1 View  08/07/2012  *RADIOLOGY REPORT*  Clinical Data: Short of breath and chest pain  PORTABLE CHEST - 1 VIEW  Comparison: 08/08/2012  Findings: Previous median sternotomy CABG procedure.  Lung volumes are low.  Bilateral pleural effusions again noted left greater than right.  Atelectasis within the left lower lobe and right base identified, similar to previous exam.  Unchanged appearance of left upper lobe scar.  IMPRESSION:  1.  No change in aeration the lungs compared with previous exam. 2.  Persistent bilateral effusions.   Original Report Authenticated By: Signa Kell, M.D.      No diagnosis found.   EKG reviewed and interpreted: Sinus arrhythmia rate 80. Left axis deviation. Patient does have discernible P waves in isolated leads. Old right bundle branch block again present with T-wave inversions. No acute ST segment  changes. Nonspecific T wave changes throughout. MDM  Patient is a 79 year old male recently discharged from this hospital for pneumonia and pleural effusions requiring thoracentesis who was brought by EMS for several hours of progressively worsening shortness of breath. EMS reports patient became abruptly unresponsive with a 15 second episode of tonic seizure-like activity in which his eyes rolled back in his head and his extremities became stiff. After this patient had a two-minute episode of complete  unresponsiveness with limpness and heart rate did drop down into the 30s with apnea. They reportedly paced the patient until he became conscious 2 minutes later. No focal neurologic deficits here. The dyspnea has improved. X-ray shows an unchanged pleural effusion on the left with no evidence of vascular congestion or infection. The patient's BNP is 3700 but near his baseline value at his recent discharge. Patient had intermittent hypotension in the emergency department but was fluid responsive. The etiology of patient's shortness of breath is likely multifactorial given the evidence of congestive heart failure, poor nutrition state, chronic pleural effusion. I do not feel the patient had a pulmonary and was in at this time. Patient avidly denies any chest pain. No hemoptysis. Gradual onset pain. No evidence of acute cardiac ischemia either. It is unknown what caused the patient's seizure\syncopal episode with bradycardia and apnea. No focal neurologic deficits and a normal head CT approximately one year ago I feel a new mass lesion is very doubtful. Admitted to critical care step down unit.       Consuello Masse, MD 08/13/12 [phone]

## 2012-08-12 NOTE — ED Notes (Signed)
Pt given a urinal.

## 2012-08-12 NOTE — ED Notes (Signed)
PT'S FAMILY CONTACT NUMBERS. [address] Laddie Aquas [phone]

## 2012-08-13 ENCOUNTER — Inpatient Hospital Stay (HOSPITAL_COMMUNITY): Payer: Medicare Other

## 2012-08-13 DIAGNOSIS — I38 Endocarditis, valve unspecified: Secondary | ICD-10-CM

## 2012-08-13 DIAGNOSIS — J9819 Other pulmonary collapse: Secondary | ICD-10-CM

## 2012-08-13 DIAGNOSIS — J189 Pneumonia, unspecified organism: Secondary | ICD-10-CM

## 2012-08-13 DIAGNOSIS — J90 Pleural effusion, not elsewhere classified: Secondary | ICD-10-CM

## 2012-08-13 DIAGNOSIS — I359 Nonrheumatic aortic valve disorder, unspecified: Secondary | ICD-10-CM

## 2012-08-13 DIAGNOSIS — R609 Edema, unspecified: Secondary | ICD-10-CM

## 2012-08-13 LAB — BASIC METABOLIC PANEL
Chloride: 100 mEq/L (ref 96–112)
Creatinine, Ser: 1.25 mg/dL (ref 0.50–1.35)
GFR calc Af Amer: 62 mL/min — ABNORMAL LOW (ref >90)

## 2012-08-13 LAB — CBC WITH DIFFERENTIAL/PLATELET
Basophils Relative: 0 % (ref 0–1)
HCT: 23.7 % — ABNORMAL LOW (ref 39.0–52.0)
Hemoglobin: 7.6 g/dL — ABNORMAL LOW (ref 13.0–17.0)
Lymphs Abs: 0.4 10*3/uL — ABNORMAL LOW (ref 0.7–4.0)
MCH: 29.1 pg (ref 26.0–34.0)
MCHC: 32.1 g/dL (ref 30.0–36.0)
Monocytes Absolute: 1.3 10*3/uL — ABNORMAL HIGH (ref 0.1–1.0)
Monocytes Relative: 9 % (ref 3–12)
Neutro Abs: 12.6 10*3/uL — ABNORMAL HIGH (ref 1.7–7.7)
Neutrophils Relative %: 88 % — ABNORMAL HIGH (ref 43–77)
RBC: 2.61 MIL/uL — ABNORMAL LOW (ref 4.22–5.81)

## 2012-08-13 LAB — INFLUENZA PANEL BY PCR (TYPE A & B)
H1N1 flu by pcr: NOT DETECTED
Influenza A By PCR: NEGATIVE
Influenza B By PCR: NEGATIVE

## 2012-08-13 LAB — LEGIONELLA ANTIGEN, URINE

## 2012-08-13 LAB — MICROALBUMIN / CREATININE URINE RATIO: Microalb Creat Ratio: 15.1 mg/g (ref 0.0–30.0)

## 2012-08-13 LAB — PRO B NATRIURETIC PEPTIDE: Pro B Natriuretic peptide (BNP): 3738 pg/mL — ABNORMAL HIGH (ref 0–450)

## 2012-08-13 LAB — MAGNESIUM: Magnesium: 1.9 mg/dL (ref 1.5–2.5)

## 2012-08-13 LAB — TROPONIN I: Troponin I: <0.3 ng/mL (ref <0.30)

## 2012-08-13 LAB — TSH: TSH: 4.302 u[IU]/mL (ref 0.350–4.500)

## 2012-08-13 MED ORDER — FUROSEMIDE 10 MG/ML IJ SOLN
40.0000 mg | Freq: Two times a day (BID) | INTRAMUSCULAR | Status: DC
  Administered 2012-08-13 – 2012-08-15 (×6): 40 mg via INTRAVENOUS
  Filled 2012-08-13 (×6): qty 4

## 2012-08-13 NOTE — Progress Notes (Signed)
  Echocardiogram 2D Echocardiogram has been performed.  Christian [name] 08/13/2012, 11:24 AM

## 2012-08-13 NOTE — ED Provider Notes (Signed)
I saw and evaluated the patient, reviewed the resident's note and I agree with the findings and plan.  I reviewed ECG and agree with his interpretation.  Pt recently discharged after thoracentesis for effusions related to pneumonia.  PT returns from rehab facility due to increased dyspnea.  En route with EMS, pt became unresponsive and bradycardic to the 30's.  Pt was paced and pt became alert again.  No chest compressions were done.  Breathing was supported by EMS.  By arrival to the ED, pt's mentation was back to baseline.  Pt with wheezing, rales, likely multifactorial dyspnea with COPD, CHF, and possibly worsening effusion or pneumonia.  Effusion seen on PCXR.  WBC is up, anemic, BP is adequate in the ED.  Discussed with PCCM who will evaluate and re-admit.      Results for orders placed during the hospital encounter of 08/15/2012 (from the past 24 hour(s))  POCT I-STAT TROPONIN I     Status: Normal   Collection Time   08/14/2012  5:46 PM      Component Value Range   Troponin i, poc 0.05  0.00 - 0.08 ng/mL   Comment 3           URINALYSIS, ROUTINE W REFLEX MICROSCOPIC     Status: Normal   Collection Time   08/07/2012  7:54 PM      Component Value Range   Color, Urine YELLOW  YELLOW   APPearance CLEAR  CLEAR   Specific Gravity, Urine 1.014  1.005 - 1.030   pH 5.0  5.0 - 8.0   Glucose, UA NEGATIVE  NEGATIVE mg/dL   Hgb urine dipstick NEGATIVE  NEGATIVE   Bilirubin Urine NEGATIVE  NEGATIVE   Ketones, ur NEGATIVE  NEGATIVE mg/dL   Protein, ur NEGATIVE  NEGATIVE mg/dL   Urobilinogen, UA 0.2  0.0 - 1.0 mg/dL   Nitrite NEGATIVE  NEGATIVE   Leukocytes, UA NEGATIVE  NEGATIVE  OCCULT BLOOD, POC DEVICE     Status: Normal   Collection Time   08/11/2012  8:47 PM      Component Value Range   Fecal Occult Bld NEGATIVE  NEGATIVE  INFLUENZA PANEL BY PCR     Status: Normal   Collection Time   08/24/2012  8:54 PM      Component Value Range   Influenza A By PCR NEGATIVE  NEGATIVE   Influenza B By PCR  NEGATIVE  NEGATIVE   H1N1 flu by pcr NOT DETECTED  NOT DETECTED  LACTIC ACID, PLASMA     Status: Normal   Collection Time   08/06/2012  9:09 PM      Component Value Range   Lactic Acid, Venous 1.7  0.5 - 2.2 mmol/L  TROPONIN I     Status: Normal   Collection Time   08/28/2012  9:09 PM      Component Value Range   Troponin I <0.30  <0.30 ng/mL  LEGIONELLA ANTIGEN, URINE     Status: Normal   Collection Time   08/13/12 12:22 AM      Component Value Range   Specimen Description URINE, CLEAN CATCH     Special Requests NONE     Legionella Antigen, Urine Negative for Legionella pneumophilia serogroup 1     Report Status 08/13/2012 FINAL    STREP PNEUMONIAE URINARY ANTIGEN     Status: Normal   Collection Time   08/13/12 12:22 AM      Component Value Range   Strep Pneumo Urinary Antigen  NEGATIVE  NEGATIVE  TROPONIN I     Status: Normal   Collection Time   08/13/12  5:05 AM      Component Value Range   Troponin I <0.30  <0.30 ng/mL  PRO B NATRIURETIC PEPTIDE     Status: Abnormal   Collection Time   08/13/12  5:05 AM      Component Value Range   Pro B Natriuretic peptide (BNP) 3738.0 (*) 0 - 450 pg/mL  MAGNESIUM     Status: Normal   Collection Time   08/13/12  5:05 AM      Component Value Range   Magnesium 1.9  1.5 - 2.5 mg/dL  BASIC METABOLIC PANEL     Status: Abnormal   Collection Time   08/13/12  5:05 AM      Component Value Range   Sodium 141  135 - 145 mEq/L   Potassium 3.6  3.5 - 5.1 mEq/L   Chloride 100  96 - 112 mEq/L   CO2 36 (*) 19 - 32 mEq/L   Glucose, Bld 110 (*) 70 - 99 mg/dL   BUN 29 (*) 6 - 23 mg/dL   Creatinine, Ser 6.04  0.50 - 1.35 mg/dL   Calcium 8.2 (*) 8.4 - 10.5 mg/dL   GFR calc non Af Amer 53 (*) >90 mL/min   GFR calc Af Amer 62 (*) >90 mL/min  CBC WITH DIFFERENTIAL     Status: Abnormal   Collection Time   08/13/12  5:05 AM      Component Value Range   WBC 14.3 (*) 4.0 - 10.5 K/uL   RBC 2.61 (*) 4.22 - 5.81 MIL/uL   Hemoglobin 7.6 (*) 13.0 - 17.0 g/dL   HCT  54.0 (*) 98.1 - 52.0 %   MCV 90.8  78.0 - 100.0 fL   MCH 29.1  26.0 - 34.0 pg   MCHC 32.1  30.0 - 36.0 g/dL   RDW 19.1  47.8 - 29.5 %   Platelets 198  150 - 400 K/uL   Neutrophils Relative 88 (*) 43 - 77 %   Neutro Abs 12.6 (*) 1.7 - 7.7 K/uL   Lymphocytes Relative 3 (*) 12 - 46 %   Lymphs Abs 0.4 (*) 0.7 - 4.0 K/uL   Monocytes Relative 9  3 - 12 %   Monocytes Absolute 1.3 (*) 0.1 - 1.0 K/uL   Eosinophils Relative 0  0 - 5 %   Eosinophils Absolute 0.0  0.0 - 0.7 K/uL   Basophils Relative 0  0 - 1 %   Basophils Absolute 0.0  0.0 - 0.1 K/uL  DIGOXIN LEVEL     Status: Normal   Collection Time   08/13/12  5:05 AM      Component Value Range   Digoxin Level 1.5  0.8 - 2.0 ng/mL  TSH     Status: Normal   Collection Time   08/13/12  5:05 AM      Component Value Range   TSH 4.302  0.350 - 4.500 uIU/mL  TROPONIN I     Status: Normal   Collection Time   08/13/12  2:46 PM      Component Value Range   Troponin I <0.30  <0.30 ng/mL    Gavin Pound. Jeraldin Fesler, MD 08/13/12 [phone]

## 2012-08-13 NOTE — Progress Notes (Signed)
PULMONARY  / CRITICAL CARE MEDICINE  Name: Christian [name] MRN: [id number] DOB: 06/18/1932    LOS: 1  REFERRING MD :  EDP  CHIEF COMPLAINT:  Shortness of breath  BRIEF PATIENT DESCRIPTION: 79 year old male with Pulmonary HTN, RHF, chronic oxygen dependent COPD (3 L) and recent left VATS with decortication 2/2 loculated pleural effusion admitted 08/15/2012 2/2 acute on chronic respiratory failure.  LINES / TUBES:  CULTURES: 1/10 Blood x2>>> 1/10 Sputum>>>  ANTIBIOTICS: Vancomycin 1/10>>> Cefepime 1/10>>> Levofloxacin 1/10>>>  SIGNIFICANT EVENTS:  1/10 Admitted to SDU for acute on chronic respiratory failure  LEVEL OF CARE:  SDU PRIMARY SERVICE:  PCCM CONSULTANTS:  CODE STATUS: FULL DIET:  Heart Healthy DVT Px:  Heparin GI Px:  Protonix  HISTORY OF PRESENT ILLNESS:  79 year old white male with PMH of Pulmonary HTN, COPD, RHF, and Afib recently discharged from [facility] on 08/09/12 for necrotizing MRSA PNA and L pleural effusion and empyema s/p VATS with decortication 07/21/12 presenting on 08/11/2012 from NH for worsening shortness of breath and edema.  Christian [name] states around 10am this morning he started having difficulty breathing with increased effort throughout the day.  Labs drawn at [facility] showed leukocytosis and Tmax 99.10F.  EMS was called and he was noted to have brief episode of unresponsiveness and seizure like activity when loaded on stretcher.  He was bagged and HR dropped to 30s, paced briefly with return of HR back up to 50-100bpm.   He also notes having a chronic productive cough with gray-brown sputum and worsening lower extremity edema.    INTERVAL HISTORY:  1/10 Admitted for acute on chronic respiratory failure s/p recent discharge from [facility] 08/09/12 for necrotizing PNA s/p VATS.    VITAL SIGNS: Temp:  [97.5 F (36.4 C)-99.1 F (37.3 C)] 97.8 F (36.6 C) (01/11 0800) Pulse Rate:  [59-76] 66  (01/11 0750) Resp:  [16-30] 25  (01/11 0750) BP: (96-122)/(38-70) 99/46 mmHg (01/11  0750) SpO2:  [93 %-100 %] 98 % (01/11 0904) Weight:  [85.1 kg (187 lb 9.8 oz)-86.2 kg (190 lb 0.6 oz)] 86.2 kg (190 lb 0.6 oz) (01/11 0430) HEMODYNAMICS:   VENTILATOR SETTINGS:   INTAKE / OUTPUT: Intake/Output      01/10 0701 - 01/11 0700 01/11 0701 - 01/12 0700   P.O. 480    IV Piggyback 250    Total Intake(mL/kg) 730 (8.5)    Urine (mL/kg/hr) 300 (0.1) 125   Stool  1   Total Output 300 126   Net +430 -126         PHYSICAL EXAMINATION: General:  Resting in bed, NAD Neuro:  AAOX3, memory and sensation grossly intact HEENT:  PERRLA, EOMI Cardiovascular:  Irregularly Irregular Lungs:  Coarse b/l breath sounds Abdomen:  +bs, non-tender, distended, soft, pitting edema Musculoskeletal:  +3 pitting edema extending to abdomen, +2DP b/l Skin:  Warm, moist, edema, + stage 1 developing decubitus ulcer, erythema, 4 (3L and 1R) gluteal cleft ulcers--stage II. Left lateral chest wall surgical scars x2 healing without drainage, mild erythema.   LABS: Cbc  Lab 08/13/12 0505 08/15/2012 1732  WBC 14.3* --  HGB 7.6* 8.2*  HCT 23.7* 25.2*  PLT 198 209   Chemistry  Lab 08/13/12 0505 08/25/2012 1732  NA 141 141  K 3.6 4.1  CL 100 100  CO2 36* 34*  BUN 29* 30*  CREATININE 1.25 1.35  CALCIUM 8.2* 8.4  MG 1.9 --  PHOS -- --  GLUCOSE 110* 139*   Liver  fxn  Lab 08/23/2012 1732  AST 19  ALT 18  ALKPHOS 92  BILITOT 0.4  PROT 5.1*  ALBUMIN 1.9*   coags No results found for this basename: APTT:3,INR:3 in the last 168 hours  Sepsis markers  Lab 08/23/2012 2109 08/20/2012 1733  LATICACIDVEN 1.7 --  PROCALCITON -- <0.10   Cardiac markers  Lab 08/13/12 0505 08/05/2012 2109  CKTOTAL -- --  CKMB -- --  TROPONINI <0.30 <0.30   BNP  Lab 08/13/12 0505 08/23/2012 1733  PROBNP 3738.0* 3699.0*   ABG No results found for this basename: PHART:3,PCO2ART:3,PO2ART:3,HCO3:3,TCO2:3 in the last 168 hours  CBG trend  Lab 08/24/2012 1722  GLUCAP 124*   IMAGING: 1/10 PCXR: Persistent b/l  effusions  ECG: HR 80bpm, Atrial fibrillation, RBBB   DIAGNOSES: Principal Problem:  *Acute on Chronic right-sided heart failure Active Problems:  COPD (chronic obstructive pulmonary disease)  CAD (coronary artery disease)  RBBB  Pulmonary hypertension  GERD (gastroesophageal reflux disease)  HCAP (healthcare-associated pneumonia)  Leukocytosis  Pleural effusion  Atrial fibrillation  Chronic respiratory failure  Syncope  Anasarca  ASSESSMENT / PLAN:  PULMONARY ASSESSMENT:  1) Acute on chronic respiratory failure in setting of o2 dependent COPD, RHF, and Pulmonary Hypertension. Modified geneva score of 3 with low probability of PE at this time  2) COPD--o2 dependent [place] 3L, on atrovent and xoponex nebz at home, not on inhaled corticosteroid or long acting cholinergic   3) HCAP-- in setting of chronic illness, recent prolonged hospitalization course and current residence in NH  4) B/L pleural effusions---persistent in setting of recent necrotizing MRSA PNA s/p VATS with decortication 07/21/12 on chronic abx  PLAN:   Atrovent and Xopenx nebz Q6H and PRN Vancomycin, Cefepime, Levofloxacin Continuous pulse oximetry, keep o2 sat >92% Oxygen therapy [place] 3L AM cxr 2 view Dulera Consider starting Spiriva at d/c See ID Trend WBC Consider VQ scan    CARDIOVASCULAR ASSESSMENT: 1) Pulmonary HTN--peak pressure per Echo 09/2011 2) Syncope--in setting of pulmonary HTN 3) R heart failure--on digoxin (supratherapeutic 07/2012 Dig level 2)  Echo 09/2011: EF 55-60% 4) CAD--cath, nuclear scan 2009: mild lateral ischemia  5) Atrial Fibrillation--on amiodarone, not candidate for anticoagulation   PLAN:  IV Lasix 40mg  BID Daily weights Strict I/O MAP goal >65 Fluid restriction 1200cc 2D Echo 1/11=> pending EKG 1/11=> pending Cycle CE=> neg Oxygen therapy [place] 3L, continuous pulse oximetry, keep o2 sat >92 Consider R heart Cath once he is closer to his dry weight. TSH=>  pending Digoxin level= 1.5 Continue Amiodorone and Digoxin ASA   RENAL ASSESSMENT:  1) CKD stage III--baseline Cr ~1-1.2.  PLAN:   Trend BMP Strict I/O   GASTROINTESTINAL ASSESSMENT:  1) GERD 2) Constipation PLAN:   Protonix Miralax PRN   HEMATOLOGIC ASSESSMENT:  Acute anemia--possibly dilutional in setting of volume overload.  S/P VATS with decortication 12/19.  Baseline Hb ~14. FOBT negative.  PLAN:  Anemia work up  Trend cbc   INFECTIOUS ASSESSMENT:  1) HCAP--afebrile, wbc 15.3 on admission with Neut #13.3, worsening SOB with b/l pleural effusion  2) B/L pleural effusions--recent hx of recent necrotizing MRSA PNA s/p VATS on 6 week course of Minocycline (started 12/27) PLAN:   Blood cx x2 Sputum cx IV abx: Vancomycin, Cefepime, Levaquin Urine Ag Legionella and Strep pneumniae F/u lactic acid and procalcitonin   ENDOCRINE ASSESSMENT: No acute issues     PLAN:  None   NEUROLOGIC ASSESSMENT:  Syncope with questionable seizure activity per witnesses? PLAN:   See  cardiovascular Consider neuro referral if concern for seizure   CLINICAL SUMMARY: 79 year old male with Pulmonary HTN, RHF, COPD, s/p recent left VATS with decortication 2/2 loculated pleural effusion admitted Sep 11, 2012 2/2 acute on chronic respiratory failure.    Lonzo Cloud. Kriste Basque, MD 08/13/2012,11:00 AM

## 2012-08-13 NOTE — Progress Notes (Signed)
Spoke with Dr. Kriste Basque about pt's BPs and meds due-- plan of care is to go ahead and administer Lasix. Renette Butters, Viona Gilmore

## 2012-08-14 ENCOUNTER — Inpatient Hospital Stay (HOSPITAL_COMMUNITY): Payer: Medicare Other

## 2012-08-14 ENCOUNTER — Encounter (HOSPITAL_COMMUNITY): Payer: Self-pay | Admitting: *Deleted

## 2012-08-14 LAB — CBC
HCT: 25 % — ABNORMAL LOW (ref 39.0–52.0)
Hemoglobin: 8 g/dL — ABNORMAL LOW (ref 13.0–17.0)
MCH: 29.3 pg (ref 26.0–34.0)
MCV: 91.6 fL (ref 78.0–100.0)
RBC: 2.73 MIL/uL — ABNORMAL LOW (ref 4.22–5.81)

## 2012-08-14 LAB — IRON AND TIBC
Iron: 32 ug/dL — ABNORMAL LOW (ref 42–135)
TIBC: 153 ug/dL — ABNORMAL LOW (ref 215–435)

## 2012-08-14 LAB — PROCALCITONIN: Procalcitonin: <0.1 ng/mL

## 2012-08-14 MED ORDER — DIGOXIN 0.0625 MG HALF TABLET
0.0625 mg | ORAL_TABLET | Freq: Every day | ORAL | Status: DC
  Filled 2012-08-14: qty 1

## 2012-08-14 MED ORDER — LORAZEPAM 0.5 MG PO TABS
0.5000 mg | ORAL_TABLET | Freq: Four times a day (QID) | ORAL | Status: DC | PRN
  Administered 2012-08-14 – 2012-08-15 (×3): 0.5 mg via ORAL
  Filled 2012-08-14 (×3): qty 1

## 2012-08-14 MED FILL — Medication: Qty: 1 | Status: AC

## 2012-08-14 NOTE — Progress Notes (Signed)
PULMONARY  / CRITICAL CARE MEDICINE  Name: Christian [name] MRN: [id number] DOB: 11/23/1931    LOS: 2  REFERRING MD :  EDP  CHIEF COMPLAINT:  Shortness of breath  BRIEF PATIENT DESCRIPTION: 79 year old male with Pulmonary HTN, RHF, chronic oxygen dependent COPD (3 L) and recent left VATS with decortication 2/2 loculated pleural effusion admitted 08/18/2012 2/2 acute on chronic respiratory failure.  LINES / TUBES:  CULTURES: 1/10 Blood x2>>> 1/10 Sputum>>>  ANTIBIOTICS: Vancomycin 1/10>>> Cefepime 1/10>>> Levofloxacin 1/10>>>  SIGNIFICANT EVENTS:  1/10 Admitted to SDU for acute on chronic respiratory failure  LEVEL OF CARE:  SDU PRIMARY SERVICE:  PCCM CONSULTANTS:  CODE STATUS: FULL DIET:  Heart Healthy DVT Px:  Heparin GI Px:  Protonix  HISTORY OF PRESENT ILLNESS:  79 year old white male with PMH of Pulmonary HTN, COPD, RHF, and Afib recently discharged from [facility] on 08/09/12 for necrotizing MRSA PNA and L pleural effusion and empyema s/p VATS with decortication 07/21/12 presenting on 08/19/2012 from NH for worsening shortness of breath and edema.  Christian [name] states around 10am this morning he started having difficulty breathing with increased effort throughout the day.  Labs drawn at [facility] showed leukocytosis and Tmax 99.33F.  EMS was called and he was noted to have brief episode of unresponsiveness and seizure like activity when loaded on stretcher.  He was bagged and HR dropped to 30s, paced briefly with return of HR back up to 50-100bpm.   He also notes having a chronic productive cough with gray-brown sputum and worsening lower extremity edema.    INTERVAL HISTORY:  1/10 Admitted for acute on chronic respiratory failure s/p recent discharge from [facility] 08/09/12 for necrotizing PNA s/p VATS.    VITAL SIGNS: Temp:  [97.4 F (36.3 C)-98 F (36.7 C)] 97.4 F (36.3 C) (01/12 0710) Pulse Rate:  [63-72] 63  (01/12 0710) Resp:  [17-24] 23  (01/12 0710) BP: (93-121)/(49-78) 121/53 mmHg (01/12  0710) SpO2:  [95 %-100 %] 99 % (01/12 0736) Weight:  [86 kg (189 lb 9.5 oz)] 86 kg (189 lb 9.5 oz) (01/12 0500) HEMODYNAMICS:   VENTILATOR SETTINGS:   INTAKE / OUTPUT: Intake/Output      01/11 0701 - 01/12 0700 01/12 0701 - 01/13 0700   P.O. 900 240   IV Piggyback 200 150   Total Intake(mL/kg) 1100 (12.8) 390 (4.5)   Urine (mL/kg/hr) 2925 (1.4)    Stool 1    Total Output 2926    Net -1826 +390         PHYSICAL EXAMINATION: General:  Resting in bed, NAD Neuro:  AAOX3, memory and sensation grossly intact HEENT:  PERRLA, EOMI Cardiovascular:  Irregularly Irregular Lungs:  Coarse b/l breath sounds Abdomen:  +bs, non-tender, distended, soft, pitting edema Musculoskeletal:  +3 pitting edema extending to abdomen, +2DP b/l Skin:  Warm, moist, edema, + stage 1 developing decubitus ulcer, erythema, 4 (3L and 1R) gluteal cleft ulcers--stage II. Left lateral chest wall surgical scars x2 healing without drainage, mild erythema.   LABS: Cbc  Lab 08/14/12 0515 08/13/12 0505 08/14/2012 1732  WBC 15.0* -- --  HGB 8.0* 7.6* 8.2*  HCT 25.0* 23.7* 25.2*  PLT 215 198 209   Chemistry  Lab 08/13/12 0505  1732  NA 141 141  K 3.6 4.1  CL 100 100  CO2 36* 34*  BUN 29* 30*  CREATININE 1.25 1.35  CALCIUM 8.2* 8.4  MG 1.9 --  PHOS -- --  GLUCOSE 110* 139*  Liver fxn  Lab 08/15/2012 1732  AST 19  ALT 18  ALKPHOS 92  BILITOT 0.4  PROT 5.1*  ALBUMIN 1.9*   coags No results found for this basename: APTT:3,INR:3 in the last 168 hours  Sepsis markers  Lab 08/14/12 0515 08/20/2012 2109 08/18/2012 1733  LATICACIDVEN -- 1.7 --  PROCALCITON <0.10 -- <0.10   Cardiac markers  Lab 08/13/12 1446 08/13/12 0505 08/08/2012 2109  CKTOTAL -- -- --  CKMB -- -- --  TROPONINI <0.30 <0.30 <0.30   BNP  Lab 08/13/12 0505 08/21/2012 1733  PROBNP 3738.0* 3699.0*   ABG No results found for this basename: PHART:3,PCO2ART:3,PO2ART:3,HCO3:3,TCO2:3 in the last 168 hours  CBG trend  Lab  08/04/2012 1722  GLUCAP 124*   IMAGING: 1/10 PCXR: Persistent b/l effusions CXR 1/12> IMPRESSION: Continued bilateral airspace disease and effusions, not significantly changed.   ECG: HR 80bpm, Atrial fibrillation, RBBB   DIAGNOSES: Principal Problem:  *Acute on Chronic right-sided heart failure Active Problems:  COPD (chronic obstructive pulmonary disease)  CAD (coronary artery disease)  RBBB  Pulmonary hypertension  GERD (gastroesophageal reflux disease)  HCAP (healthcare-associated pneumonia)  Leukocytosis  Pleural effusion  Atrial fibrillation  Chronic respiratory failure  Syncope  Anasarca  ASSESSMENT / PLAN:  PULMONARY ASSESSMENT:  1) Acute on chronic respiratory failure in setting of o2 dependent COPD, RHF, and Pulmonary Hypertension. Modified geneva score of 3 with low probability of PE at this time  2) COPD--o2 dependent [place] 3L, on atrovent and xoponex nebz at home, not on inhaled corticosteroid or long acting cholinergic   3) HCAP-- in setting of chronic illness, recent prolonged hospitalization course and current residence in NH  4) B/L pleural effusions---persistent in setting of recent necrotizing MRSA PNA s/p VATS with decortication 07/21/12 on chronic abx  PLAN:   Atrovent and Xopenx nebz Q6H and PRN Vancomycin, Cefepime, Levofloxacin Continuous pulse oximetry, keep o2 sat >92% Oxygen therapy [place] 3L AM cxr 2 view Dulera Consider starting Spiriva at d/c See ID Trend WBC Consider VQ scan    CARDIOVASCULAR ASSESSMENT: 1) Pulmonary HTN--peak pressure per Echo 1/14 2) Syncope--in setting of pulmonary HTN 3) R heart failure--on digoxin (supratherapeutic 07/2012 Dig level 2)  Echo 09/2011: EF 55-60% 4) CAD--cath, nuclear scan 2009: mild lateral ischemia  5) Atrial Fibrillation--on amiodarone, not candidate for anticoagulation   PLAN:  IV Lasix 40mg  BID Daily weights Strict I/O MAP goal >65 Fluid restriction 1200cc 2D Echo 1/11=> norm LVF w/  EF=70-75%, no regional wall motion abn, mod to severe AS, mod MS, mod LA dil, decr RV funct & severe pulm HTN w/ PA sys=77... EKG 1/11=> Afib, controlled VR, RBBB Cycle CE=> neg Oxygen therapy [place] 3L, continuous pulse oximetry, keep o2 sat >92 Consider R heart Cath once he is closer to his dry weight. TSH=> 4.30 Digoxin level= 1.5 Continue Amiodorone and Digoxin ASA   RENAL ASSESSMENT:  1) CKD stage III--baseline Cr ~1-1.2.  PLAN:   Trend BMP Strict I/O   GASTROINTESTINAL ASSESSMENT:  1) GERD 2) Constipation PLAN:   Protonix Miralax PRN   HEMATOLOGIC ASSESSMENT:  Acute anemia--possibly dilutional in setting of volume overload.  S/P VATS with decortication 12/19.  Baseline Hb ~14. FOBT negative.  PLAN:  Anemia work up  Nurse, adult => Hg 7.6=> 8.0   INFECTIOUS ASSESSMENT:  1) HCAP--afebrile, wbc 15.3 on admission with Neut #13.3, worsening SOB with b/l pleural effusion  2) B/L pleural effusions--recent hx of recent necrotizing MRSA PNA s/p VATS on 6 week course of  Minocycline (started 12/27) PLAN:   Blood cx x2 Sputum cx IV abx: Vancomycin, Cefepime, Levaquin Urine Ag Legionella and Strep pneumniae F/u lactic acid and procalcitonin   ENDOCRINE ASSESSMENT: No acute issues     PLAN:  None   NEUROLOGIC ASSESSMENT:  Syncope with questionable seizure activity per witnesses? PLAN:   See cardiovascular Consider neuro referral if concern for seizure   CLINICAL SUMMARY: 79 year old male with Pulmonary HTN, RHF, COPD, s/p recent left VATS with decortication 2/2 loculated pleural effusion admitted Aug 15, 2012 2/2 acute on chronic respiratory failure, known valv heart dis, pulm HTN (followed by [facility]).    Lonzo Cloud. Kriste Basque, MD 08/14/2012,10:54 AM

## 2012-08-14 NOTE — Plan of Care (Signed)
Problem: Phase I Progression Outcomes Goal: Voiding-avoid urinary catheter unless indicated Outcome: Progressing Condom cath     

## 2012-08-14 NOTE — Progress Notes (Signed)
eLink Physician-Brief Progress Note Patient Name: Christian [name] DOB: [birth year] MRN: [id number]  Date of Service  08/14/2012   HPI/Events of Note   Family is at the bedside.  Wife is to arrive shortly.  Sandy (medical POA) requesting no CPR / Intubation / frequent labs with goal being comfort.   eICU Interventions   Code status changed to DNR.  Will stop BiPAP.  Labs discontinued.  No indication for Morphine gtt at this point, but would not hesitate to use if respiratory distress.  Will continue routine medications for now, to be reevaluated in rounds.    Intervention Category Minor Interventions: Communication with other healthcare providers and/or family  Lonia Farber 08/14/2012, 6:31 PM

## 2012-08-14 NOTE — Progress Notes (Signed)
Called and spoke with Christian [name], pt's daughter -- pt is lethargic but easily arousable, oriented, on Bipap and breathing well. Dr. Marin Shutter called and notified around 1810 that pt's family is now at bedside. Renette Butters, Viona Gilmore

## 2012-08-14 NOTE — Code Documentation (Signed)
CODE BLUE NOTE  Patient Name: Christian [name]   MRN: [id number]   Date of Birth/ Sex: 05-27-1932 , male      Admission Date: 08/30/2012  Attending Provider: Curlene Dolphin, MD  Primary Diagnosis: Chronic right-sided heart failure    Indication: Pt was in his usual state of health until this PM, when he was noted to be unresponsive. Code blue was subsequently called. At the time of arrival on scene, ACLS protocol was underway.    Technical Description:  - CPR performance duration:  15  minutes  - Was defibrillation or cardioversion used? No   - Was external pacer placed? No  - Was patient intubated pre/post CPR? No    Medications Administered: Y = Yes; Blank = No Amiodarone    Atropine  Y  Calcium    Epinephrine  Y  Lidocaine    Magnesium    Norepinephrine    Phenylephrine    Sodium bicarbonate    Vasopressin      Post CPR evaluation:  - Final Status - Was patient successfully resuscitated ? Yes - What is current rhythm? sinus - What is current hemodynamic status? unstable   Miscellaneous Information:  - Labs sent, including: N/A  - Primary team notified?  Yes  - Family Notified? Yes  - Additional notes/ transfer status: N/A   Elfredia Nevins, MD  08/14/2012, 5:42 PM

## 2012-08-14 NOTE — Progress Notes (Signed)
Spoke with Dr. Marin Shutter about pt's BPs -- no new orders at this time. Renette Butters, Viona Gilmore

## 2012-08-14 NOTE — Progress Notes (Signed)
eLink Physician-Brief Progress Note Patient Name: Christian [name] DOB: 09/16/1931 MRN: [id number]  Date of Service  08/14/2012   HPI/Events of Note   CPR in progress.  Return of spontaneous circulation with ACLS. Assisted respirations with Ambu.   eICU Interventions   Discussed events with medical POA - Sandy.  She does not think that aggressive measures (such as mechanical ventilation) are appropriate, but wants to discuss it with her father when she arrives to the hospital in 15-20 minutes.  Will use BiPAP for now. 12-lead ECG and cardiac enzymes ordered.    Intervention Category Major Interventions: Code management / supervision  Tyrus Wilms 08/14/2012, 5:58 PM

## 2012-08-14 NOTE — Progress Notes (Signed)
18:15 Weaned pt from BIPAP to 6L [place] per order.  SPO2 100% pt tolerating well.  RT to monitor

## 2012-08-14 NOTE — Progress Notes (Signed)
Pt called to use BSC, RN assisted pt to [facility], pt on monitor VSS, sats decreased,  RN increased O2 to 6L pt sats improved, RN stayed with pt while on [facility], RN helped pt back to bed, pt became unresponsive with periods of apnea, RN attempted sternal rub with no response from pt, Code called by RN at [facility], CPR also started by RN at [facility]

## 2012-08-15 ENCOUNTER — Encounter (HOSPITAL_COMMUNITY): Payer: Self-pay | Admitting: Nurse Practitioner

## 2012-08-15 DIAGNOSIS — Z515 Encounter for palliative care: Secondary | ICD-10-CM

## 2012-08-15 DIAGNOSIS — J9610 Chronic respiratory failure, unspecified whether with hypoxia or hypercapnia: Secondary | ICD-10-CM

## 2012-08-15 DIAGNOSIS — R0989 Other specified symptoms and signs involving the circulatory and respiratory systems: Secondary | ICD-10-CM

## 2012-08-15 DIAGNOSIS — J9620 Acute and chronic respiratory failure, unspecified whether with hypoxia or hypercapnia: Principal | ICD-10-CM

## 2012-08-15 DIAGNOSIS — I4891 Unspecified atrial fibrillation: Secondary | ICD-10-CM

## 2012-08-15 DIAGNOSIS — I359 Nonrheumatic aortic valve disorder, unspecified: Secondary | ICD-10-CM

## 2012-08-15 DIAGNOSIS — J449 Chronic obstructive pulmonary disease, unspecified: Secondary | ICD-10-CM

## 2012-08-15 MED ORDER — ALBUTEROL SULFATE (5 MG/ML) 0.5% IN NEBU
2.5000 mg | INHALATION_SOLUTION | RESPIRATORY_TRACT | Status: DC | PRN
  Administered 2012-08-15: 2.5 mg via RESPIRATORY_TRACT
  Filled 2012-08-15: qty 0.5

## 2012-08-15 MED ORDER — LORAZEPAM 2 MG/ML IJ SOLN
1.0000 mg | Freq: Once | INTRAMUSCULAR | Status: AC
  Administered 2012-08-15: 1 mg via INTRAVENOUS
  Filled 2012-08-15: qty 1

## 2012-08-15 MED ORDER — DIGOXIN 0.25 MG/ML IJ SOLN
0.0625 mg | Freq: Every day | INTRAMUSCULAR | Status: DC
  Administered 2012-08-15: 0.0625 mg via INTRAVENOUS
  Filled 2012-08-15 (×3): qty 0.5

## 2012-08-15 MED ORDER — SODIUM CHLORIDE 0.9 % IV SOLN
1.0000 mg/h | INTRAVENOUS | Status: DC
  Administered 2012-08-15: 1 mg/h via INTRAVENOUS
  Filled 2012-08-15: qty 10

## 2012-08-15 MED ORDER — ACETAMINOPHEN 650 MG RE SUPP: 650.0000 mg | RECTAL | Status: DC | PRN

## 2012-08-15 MED ORDER — POTASSIUM CHLORIDE 10 MEQ/100ML IV SOLN
10.0000 meq | INTRAVENOUS | Status: AC
  Administered 2012-08-15 (×2): 10 meq via INTRAVENOUS
  Filled 2012-08-15 (×3): qty 100

## 2012-08-15 MED ORDER — ATROPINE SULFATE 1 % OP SOLN
4.0000 [drp] | OPHTHALMIC | Status: DC | PRN
  Filled 2012-08-15: qty 2

## 2012-08-15 NOTE — Progress Notes (Signed)
eLink Physician-Brief Progress Note Patient Name: Christian [name] DOB: 1932-03-20 MRN: [id number]  Date of Service  08/15/2012   HPI/Events of Note  Struggling with breathing now on 100% NRB.  Family requesting morphine gtt.  Had not responded to ativan dosing earlier.  eICU Interventions  Plan: Initiate morphine gtt at 5 mg/hr.  Nurse to call if no relief and will increase dose.   Intervention Category Major Interventions: End of life / care limitation discussion  DETERDING,ELIZABETH 08/15/2012, 1:43 AM

## 2012-08-15 NOTE — Consult Note (Signed)
CARDIOLOGY CONSULT NOTE  Patient ID: Christian [name] MRN: [id number], DOB/AGE: 1931/12/29   Admit date: 09/02/2012 Date of Consult: 08/15/2012   Primary Physician: Michele Mcalpine, MD Primary Cardiologist: Lovena Neighbours, MD  Pt. Profile  79 y/o male with multiple admissions since November secondary to complications of necrotizing pna, whom we've been asked to evaluate due to progression of aortic stenosis and respiratory failure.  Problem List  Past Medical History  Diagnosis Date  . Allergic rhinitis   . COPD (chronic obstructive pulmonary disease)   . Disorders of diaphragm   . Acute bronchitis   . Hypertension   . CAD (coronary artery disease)     Catheterization 2009, mild coronary disease (after abnormal nuclear study,With hypotensive response to exercise, 2009)  . RBBB   . Atrial flutter     Ablated in the past, no recurrence  . Venous insufficiency   . Edema   . Dyslipidemia     Patient has an and to use statin  . GERD (gastroesophageal reflux disease)   . Diverticulosis of colon   . Hypertrophy of prostate with urinary obstruction and other lower urinary tract symptoms (LUTS)   . Increased prostate specific antigen (PSA) velocity   . DJD (degenerative joint disease)   . Chronic insomnia   . Malignant melanoma     Removed from mediastinum via thoracotomy July, 2010  . Ejection fraction     EF 60%,Echo, 2009,  /  EF 55-60%, echo, February, 2013  . Aortic stenosis     Mild, echo and catheterization, 2009 /  Mild, echo, February, 2013  . Mitral stenosis     Very mild, echo, from mitral annular calcification  . Shortness of breath     Episodes of feeling shortness of breath with some mild dizziness with mild exertion., February, 2013  . Tricuspid regurgitation     Moderate, echo, February, 2013, 56 mmHg  . Pulmonary hypertension     56 mmHg, echo, February, 2013  . Pneumonia   . Atrial fibrillation     Past Surgical History  Procedure Date  . Bilateral inguinal  hernia repair   . Umbilical hernia repair   . Median sternotomy for mediastinal melanoma 1980s  . Video assisted thoracoscopy (vats)/empyema 07/21/2012    Procedure: VIDEO ASSISTED THORACOSCOPY (VATS)/EMPYEMA;  Surgeon: Loreli Slot, MD;  Location: [facility] OR;  Service: Thoracic;  Laterality: Left;  Left video assisted thorascopy for empyema and decortication   . Video bronchoscopy 07/21/2012    Procedure: VIDEO BRONCHOSCOPY;  Surgeon: Loreli Slot, MD;  Location: [facility] OR;  Service: Thoracic;  Laterality: Bilateral;    Allergies  Allergies  Allergen Reactions  . Ivp Dye (Iodinated Diagnostic Agents)    HPI   79 year old white male with the above complex problem list.  Dating back to November of 2013, [facility]ation and CAP, which did not immediately improve despite empiric broad-spectrum abx.  CT of the chest showed extensive bilateral cavitary pna with areas of necrosis.  Sputum culture showed MRSA sensitive to vancomycin and after ID consultation, decision was made to continue abx for 4 additional weeks @ the time of d/c on 11/25.  He f/u in pulm clinic on 12/17 and repeat cxr showed worsening pna and pleural effusions.  He was readmitted and subsequently underwent bronchoscopy, left VATS, drainage, and decortication.  He remained on abx and cultures returned showing Stenotrophomonas maltophilia.  ID was consulted and abx were adjusted for  coverage.  During that hospitalization he developed atrial fibrillation, which has been rate controlled.  He was not felt to be a coumadin candidate.  He was subsequently discharged to [facility] on 1/7 but readmitted 1/10 secondary to worsening dyspnea.  During transfer, pt briefly developed bradycardia and respiratory failure has been progressive, briefly requiring bipap.  Last night pt became unresponsive and Code was called.  CPR was initiated and pt had ROSC.  He has recovered from that episode however after further  discussion with family, comfort measures have been instituted.  We have been asked to evaluate him secondary to worsening of aortic stenosis noted on echo on 1/11.   Inpatient Medications     . digoxin  0.0625 mg Intravenous Daily  . furosemide  40 mg Intravenous BID  . heparin  5,000 Units Subcutaneous Q8H  . ipratropium  0.5 mg Nebulization Q6H  . levalbuterol  0.63 mg Nebulization Q6H  . potassium chloride  10 mEq Intravenous Q1 Hr x 4  . potassium chloride SA  40 mEq Oral Daily  . sodium chloride  3 mL Intravenous Q12H   Family History History reviewed. No pertinent family history.   Social History History   Social History  . Marital Status: Married    Spouse Name: evelyn Chopin    Number of Children: 6  . Years of Education: N/A   Occupational History  . 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 Smoking status: Former Smoker -- 11 years    Types: Cigarettes    Quit date: 08/03/1960  . Smokeless tobacco: Never Used  . Alcohol Use: No  . Drug Use: No  . Sexually Active: No   Other Topics Concern  . Not on file   Social History Narrative   2 biological children and 4  adopted children    Review of Systems  General:  No chills, fever, night sweats or weight changes.  Cardiovascular:  No chest pain, +++ dyspnea on exertion, +++ edema, orthopnea, palpitations, paroxysmal nocturnal dyspnea. Dermatological: No rash, lesions/masses Respiratory: No cough, +++ dyspnea Urologic: No hematuria, dysuria Abdominal:   No nausea, vomiting, diarrhea, bright red blood per rectum, melena, or hematemesis Neurologic:  No visual changes, +++ wkns, changes in mental status. All other systems reviewed and are otherwise negative except as noted above.  Physical Exam  Blood pressure 90/52, pulse 68, temperature 97.5 F (36.4 C), temperature source Oral, resp. rate 15, height 6' (1.829 m), weight 189 lb 9.5 oz (86 kg), SpO2 100.00%.  General: Pleasant, NAD -  very groggy. Psych: Normal affect. Neuro: Alert and oriented X 3. Moves all extremities spontaneously. HEENT: Normal  Neck: Supple without bruits or JVD. Lungs:  Resp regular and unlabored, diminished breath sounds bilat. Heart: ir, ir, 3/6 sem throughout. Abdomen: Soft, non-tender, non-distended, BS + x 4.  Extremities: No clubbing, cyanosis.  2+ bilat LE edema. DP/PT/Radials 2+ and equal bilaterally.  Labs   Basename 08/13/12 1446 08/13/12 0505 09-06-12 2109  CKTOTAL -- -- --  CKMB -- -- --  TROPONINI <0.30 <0.30 <0.30   Lab Results  Component Value Date   WBC 15.0* 08/14/2012   HGB 8.0* 08/14/2012   HCT 25.0* 08/14/2012   MCV 91.6 08/14/2012   PLT 215 08/14/2012     Lab 08/13/12 0505 09/06/12 1732  NA 141 --  K 3.6 --  CL 100 --  CO2 36* --  BUN 29* --  CREATININE 1.25 --  CALCIUM 8.2* --  PROT -- 5.1*  BILITOT -- 0.4  ALKPHOS -- 92  ALT -- 18  AST -- 19  GLUCOSE 110* --   Lab Results  Component Value Date   CHOL 135 06/27/2012   HDL 51 06/27/2012   LDLCALC 76 06/27/2012   TRIG 40 06/27/2012   Radiology/Studies  Dg Chest 2 View  08/13/2012  *RADIOLOGY REPORT*  Clinical Data: Pleural effusions.  CHEST - 2 VIEW  Comparison: Chest x-ray 08/14/2012.  Findings: Lung volume is low.  Extensive bibasilar opacities may reflect areas of atelectasis and/or consolidation. Moderate - large left and small right pleural effusions.  Pulmonary venous congestion, without frank pulmonary edema.  Cardiac silhouette is completely obscured.  Atherosclerosis of the thoracic aorta. Status post median sternotomy.  IMPRESSION: 1.  Persistent moderate - large left and small right pleural effusions with bibasilar areas (left greater than right) of atelectasis and/or consolidation. 2.  Atherosclerosis.   Original Report Authenticated By: Trudie Reed, M.D.    Dg Chest Port 1 View  08/14/2012  *RADIOLOGY REPORT*  Clinical Data: Follow-up.  PORTABLE CHEST - 1 VIEW  Comparison: 08/13/2012   Findings: Cardiomegaly.  Bilateral airspace opacities are again noted.  Bilateral effusions.  Prior CABG.  IMPRESSION: Continued bilateral airspace disease and effusions, not significantly changed.   Original Report Authenticated By: Charlett Nose, M.D.    ECG  Afib, 80, rbbb  2D Echocardiogram 08/13/2012  Study Conclusions  - Left ventricle: The cavity size was normal. Wall thickness   was normal. Systolic function was vigorous. The estimated   ejection fraction was in the range of 70% to 75%. Wall   motion was normal; there were no regional wall motion   abnormalities. The study is not technically sufficient to   allow evaluation of LV diastolic function. - Ventricular septum: The contour showed diastolic   flattening and systolic flattening. - Aortic valve: Moderately to severely calcified annulus. A   bicuspid morphology cannot be excluded; mildly calcified   leaflets. Cusp separation was reduced. There was moderate   to severe stenosis. Trivial regurgitation. Mean gradient:   32mm Hg (S). Peak gradient: 55mm Hg (S). VTI ratio of LVOT   to aortic valve: 0.34. Valve area: 1.08cm^2(VTI). Valve   area: 1.08cm^2 (Vmax). - Mitral valve: Calcified annulus. Mildly thickened leaflets   . The findings are consistent with moderate stenosis. Mild   regurgitation. Mean gradient: 12mm Hg (D). Valve area by   pressure half-time: 1.71cm^2. Valve area by continuity   equation (using LVOT flow): 0.85cm^2. - Left atrium: The atrium was moderately dilated. - Right ventricle: The cavity size was mildly dilated.   Systolic function was reduced. - Right atrium: The atrium was moderately dilated. Central   venous pressure: 15mm Hg (est). - Tricuspid valve: Moderate regurgitation. - Pulmonic valve: Mildly thickened leaflets. Trivial   regurgitation. - Pulmonary arteries: Systolic pressure was severely   increased. PA peak pressure: 77mm Hg (S). - Inferior vena cava: The vessel was dilated; the    respirophasic diameter changes were blunted (< 50%);   findings are consistent with elevated central venous   pressure. - Pericardium, extracardiac: There was no pericardial   effusion. _____________  ASSESSMENT AND PLAN  1. Acute on chronic respiratory failure:  Difficult course since the fall.  He is now on comfort measures.    2.  Valvular Heart Dzs including mod-sev AS and mod MS:  Conservative mgmt.  Discussed with family who voiced understanding and are in agreement.  We will review  echo's given fairly rapid progression of AS (mild 09/2011).  Signed, Nicolasa Ducking, NP 08/15/2012, 4:38 PM  I have personally seen and examined this patient with Ward Givens, NP. I agree with the assessment and plan as outlined above. I have discussed his active cardiac issues with the family. He has had somewhat rapid progression of his valvular heart disease over the last 11 months. His family understands that this will not be addressed given his end stage pulmonary disease with a very aggressive infectious process. They are very comfortable with their plan for comfort measures going forward. Thank you for the consultation. We will sign off for now. Please call with questions.   MCALHANY,CHRISTOPHER 6:37 PM 08/15/2012

## 2012-08-15 NOTE — Progress Notes (Signed)
Pt anxious called DR, Deterding 1 mg ativan ordered gave dose, pt preceded to get worse o2 sat's  Decreased in the low 80's on 6 liter nasal cannula pt resp in the upper 30"s pt placed on NRB    DR Deterding  Notified family  requesting morphine GTT MD ordered to start @ % mg per hour pt already more calm on NRB family request to start at lowest possible will continue to monitor

## 2012-08-15 NOTE — Progress Notes (Signed)
eLink Physician-Brief Progress Note Patient Name: Christian [name] DOB: May 19, 1932 MRN: [id number]  Date of Service  08/15/2012   HPI/Events of Note  Anxiety/anxious - DNR   eICU Interventions  Plan: 1 mg ativan IV now   Intervention Category Minor Interventions: Agitation / anxiety - evaluation and management  Naziah Weckerly 08/15/2012, 12:51 AM

## 2012-08-15 NOTE — Progress Notes (Signed)
Per dr Deterding and with family's agreement pt has been transitioned to full comfort care, will continue to monitor

## 2012-08-15 NOTE — Progress Notes (Signed)
PULMONARY  / CRITICAL CARE MEDICINE  Name: Christian [name] MRN: [id number] DOB: [birth year]    LOS: 3  REFERRING MD :  EDP  CHIEF COMPLAINT:  Shortness of breath  BRIEF PATIENT DESCRIPTION: 79 year old male with Pulmonary HTN, RHF, chronic oxygen dependent COPD (3 L) and recent left VATS with decortication 2/2 loculated pleural effusion admitted 09/11/12 2/2 acute on chronic respiratory failure.  LINES / TUBES:  CULTURES: 1/10 Blood x2>>> 1/10 Sputum>>>  ANTIBIOTICS: Vancomycin 1/10>>> Cefepime 1/10>>> Levofloxacin 1/10>>>  SIGNIFICANT EVENTS:  1/10 Admitted to SDU for acute on chronic respiratory failure 1/11 Echocardiogram: LVEF 70-75%. Moderate to severe AS, Moderate MS, mild MR 1/12 Brief CPR for unresponsiveness. Not intubated. Subsequently made NCB. MSO4 gtt initiated for dyspnea 1/13 Family conference. Agree to focus predominantly on comfort.      SUBJ: RASS -1 to -2. Mildly dyspneic @ rest.   VITAL SIGNS: Temp:  [97.5 F (36.4 C)-98.1 F (36.7 C)] 97.5 F (36.4 C) (01/12 2335) Pulse Rate:  [66-74] 68  (01/13 0300) Resp:  [15-21] 15  (01/13 0300) BP: (81-115)/(42-55) 90/52 mmHg (01/12 2335) SpO2:  [93 %-100 %] 100 % (01/13 1610) HEMODYNAMICS:   VENTILATOR SETTINGS:   INTAKE / OUTPUT: Intake/Output      01/13 0701 - 01/14 0700   P.O.    I.V. (mL/kg) 27 (0.3)   IV Piggyback 100   Total Intake(mL/kg) 127 (1.5)   Urine (mL/kg/hr)    Total Output    Net +127        PHYSICAL EXAMINATION: General:  Resting in bed, NAD Neuro:  AAOX3, memory and sensation grossly intact HEENT:  PERRLA, EOMI Cardiovascular:  Irregularly Irregular Lungs:  Coarse b/l breath sounds Abdomen:  +bs, non-tender, distended, soft, pitting edema EXT: severe LE edema/anasarca  LABS: Cbc  Lab 08/14/12 0515 08/13/12 0505 11-Sep-2012 1732  WBC 15.0* -- --  HGB 8.0* 7.6* 8.2*  HCT 25.0* 23.7* 25.2*  PLT 215 198 209   Chemistry  Lab 08/13/12 0505 Sep 11, 2012 1732  NA 141 141  K  3.6 4.1  CL 100 100  CO2 36* 34*  BUN 29* 30*  CREATININE 1.25 1.35  CALCIUM 8.2* 8.4  MG 1.9 --  PHOS -- --  GLUCOSE 110* 139*   Liver fxn  Lab Sep 11, 2012 1732  AST 19  ALT 18  ALKPHOS 92  BILITOT 0.4  PROT 5.1*  ALBUMIN 1.9*   coags No results found for this basename: APTT:3,INR:3 in the last 168 hours  Sepsis markers  Lab 08/14/12 0515 Sep 11, 2012 2109 09-11-12 1733  LATICACIDVEN -- 1.7 --  PROCALCITON <0.10 -- <0.10   Cardiac markers  Lab 08/13/12 1446 08/13/12 0505 09/11/12 2109  CKTOTAL -- -- --  CKMB -- -- --  TROPONINI <0.30 <0.30 <0.30   BNP  Lab 08/13/12 0505 09-11-2012 1733  PROBNP 3738.0* 3699.0*   ABG No results found for this basename: PHART:3,PCO2ART:3,PO2ART:3,HCO3:3,TCO2:3 in the last 168 hours  CBG trend  Lab Sep 11, 2012 1722  GLUCAP 124*   IMAGING: CXR: no new film   ECG: AF on monitor   DIAGNOSES:  Acute on chronic hypoxic resp failure due to pulmonary edema  Acute on Chronic biventricular failure  Valvular heart disease   COPD (chronic obstructive pulmonary disease)  H/O CAD (coronary artery disease)  Secondary Pulmonary hypertension  Recent HCAP (healthcare-associated pneumonia)  Mild Leukocytosis  Large bilateral Pleural effusions  Chronic Atrial fibrillation  Syncope  Anasarca  PLAN: Cont morphine gtt for comfort Comfort is highest priority  Continue diuresis to extent permitted by BP and renal function As much nutrition as he can tolerate  Not a candidate for feeding tube DNR reconfirmed with family Palliative care consult Cards consult - longstanding pt of Dr Myrtis Ser and family needs reassurance that no other options of therapy exist   I spoke at length with pt's family including wife, daughter, son and grandchildren. The daughter was confrontational during that meeting and accusatory towards the whole medical system. Most importantly, she believes that Mr Schorr was discharged too soon after the last hospitalization. I  explained the rationale for discharge to the best of my ability and the wife and son seemed satisfied. In fact, the son was complimentary of my "bedside manner". However, after the conference with the family was completed, the daughter asked to speak with me in private. We used an office on the [address] floor. Behind closed doors, she became verbally abusive and insulting. She was extremely angry and multiple times was pointing her finger a mere few inches from my face. This went on for several minutes until I told her that I would not listen any more and that I thought her behavior was very inappropriate - my exact words were that she had crossed "way over the line" in her verbal assault. I explained that I understood the reason for her anger and frustration but that I was not compelled to endure it. At that point, she was less verbally aggressive and in fact was somewhat conciliatory. We ended on a reasonably good note, acknowledging that Mr Colasanti course had not turned out the way we had hoped it would and that there were steps along the way where the care providers could have been more forthcoming with information and understanding towards the family.   60 mins prolonged service   Billy Fischer, MD ; [facility] [phone].  After 5:30 PM or weekends, call [phone]

## 2012-08-15 NOTE — Progress Notes (Signed)
Utilization review completed.  

## 2012-08-15 NOTE — Consult Note (Signed)
Patient Christian [name]      DOB: 03/31/1932      HQI:[id number]     Consult Note from the Palliative Medicine Team at [facility]    Consult Requested by: Dr. Sung Amabile    PCP: Michele Mcalpine, MD Reason for Consultation: symptom management  Phone [phone]  Assessment of patients Current state: 79 year old white male with past medical history of COPD, heart failure, and was recently hospitalized for treatment of necrotizing pneumonia pleural effusion with VATS with decortication.  He was rehabilitating at a SNF when he developed increased respiratory distress, fever and leukocytosis.  When EMS arrived patient had a period of unresponsiveness with seizure activity with bradycardia for which he was briefly bagged.  He was admitted to the hospital and treated for respiratory failure. His family elected to pursue comfort care and a morphine drip and was initiated to control his symptoms.  I have been asked to assist with symptom management at the end of life.     Goals of Care: 1.  Code Status: DNR   2. Scope of Treatment: 1. Vital Signs: family still asking to check labs and continue antibiotics for now,  Certainly can consider transition to monitored floor if family agreeable. 2. Respiratory/Oxygen: adequate oxygenation at this time continue oxygen for comfort 3. Nutritional Support/Tube Feeds: I can address this with family in am we will get together at 11 am, at this time they are not requesting support. 4. Antibiotics: family desires to continue antibiotics at this time.  Will be meeting them at 1045- 11 am tomorrow to offer support and see what other changes they would like to make 5. Labs: currently would like to see how he is doing so would like to keep the labs for the am. Telemetry: When family comfortable would transition to non-tele bed. 6.  Chest xray scheduled will likely assist family in making appropriate choices.  4. Disposition: Likely will have a hospital  death.   3. Symptom Management:   1. Anxiety/Agitation: ativan prn in place agree with dosing 2. Pain:Morphine drip in place.  Currently comfortable at current does 3. Delirium: controlled with current measures 4. Fever: prn Tylenol pr 5. Nausea/Vomiting: prn zofran 6. Terminal Secretions: add atropine prn 7. Patient unable to take po change tylenol to prn and dc po potassium 8. Sacral Decubitus:  WOC team seen all orders in place.   4. Psychosocial:  Married.  Spent some time with his wife Renea Ee  5. Spiritual: Mrs. Vandrunen states they are [employer]'s and she talked to God assuring him that she is ready to loose him but still hopeful that God will spare him.   Patient Documents Completed or Given: Document Given Completed  Advanced Directives Pkt    MOST    DNR    Gone from My Sight    Hard Choices      Brief HPI: 79 year old white male with respiratory failure with bilateral airspace disease admitted 1/10 course complicated by code blue with worsening respiratory failure.  Family elected to purse comfort care which I have confirmed for now includes antibiotics, and labs.     ROS:  Unable to communicate his needs due to sedated.    PMH:  Past Medical History  Diagnosis Date  . Allergic rhinitis   . COPD (chronic obstructive pulmonary disease)   . Disorders of diaphragm   . Acute bronchitis   . Hypertension   . CAD (coronary artery disease)     Catheterization  2009, mild coronary disease (after abnormal nuclear study,With hypotensive response to exercise, 2009)  . RBBB   . Atrial flutter     Ablated in the past, no recurrence  . Venous insufficiency   . Edema   . Dyslipidemia     Patient has an and to use statin  . GERD (gastroesophageal reflux disease)   . Diverticulosis of colon   . Hypertrophy of prostate with urinary obstruction and other lower urinary tract symptoms (LUTS)   . Increased prostate specific antigen (PSA) velocity   . DJD (degenerative joint disease)    . Chronic insomnia   . Malignant melanoma     Removed from mediastinum via thoracotomy July, 2010  . Ejection fraction     EF 60%,Echo, 2009,  /  EF 55-60%, echo, February, 2013  . Aortic stenosis     Mild, echo and catheterization, 2009 /  Mild, echo, February, 2013  . Mitral stenosis     Very mild, echo, from mitral annular calcification  . Shortness of breath     Episodes of feeling shortness of breath with some mild dizziness with mild exertion., February, 2013  . Tricuspid regurgitation     Moderate, echo, February, 2013, 56 mmHg  . Pulmonary hypertension     56 mmHg, echo, February, 2013  . Pneumonia   . Atrial fibrillation      PSH: Past Surgical History  Procedure Date  . Bilateral inguinal hernia repair   . Umbilical hernia repair   . Median sternotomy for mediastinal melanoma 1980s  . Video assisted thoracoscopy (vats)/empyema 07/21/2012    Procedure: VIDEO ASSISTED THORACOSCOPY (VATS)/EMPYEMA;  Surgeon: Loreli Slot, MD;  Location: [facility] OR;  Service: Thoracic;  Laterality: Left;  Left video assisted thorascopy for empyema and decortication   . Video bronchoscopy 07/21/2012    Procedure: VIDEO BRONCHOSCOPY;  Surgeon: Loreli Slot, MD;  Location: [facility] OR;  Service: Thoracic;  Laterality: Bilateral;   I have reviewed the FH and SH and  If appropriate update it with new information. Allergies  Allergen Reactions  . Ivp Dye (Iodinated Diagnostic Agents)    Scheduled Meds:   . digoxin  0.0625 mg Intravenous Daily  . furosemide  40 mg Intravenous BID  . heparin  5,000 Units Subcutaneous Q8H  . ipratropium  0.5 mg Nebulization Q6H  . levalbuterol  0.63 mg Nebulization Q6H  . potassium chloride  10 mEq Intravenous Q1 Hr x 4  . potassium chloride SA  40 mEq Oral Daily  . sodium chloride  3 mL Intravenous Q12H   Continuous Infusions:   . morphine 1 mg/hr (08/15/12 0500)   PRN Meds:.sodium chloride, sodium chloride, acetaminophen, albuterol, LORazepam,  polyethylene glycol, promethazine, sodium chloride    BP 90/52  Pulse 68  Temp 97.5 F (36.4 C) (Oral)  Resp 15  Ht 6' (1.829 m)  Wt 86 kg (189 lb 9.5 oz)  BMI 25.71 kg/m2  SpO2 100%   PPS: 10%   Intake/Output Summary (Last 24 hours) at 08/15/12 1612 Last data filed at 08/15/12 0500  Gross per 24 hour  Intake      4 ml  Output   1000 ml  Net   -996 ml   LBM:08/13/12                      Physical Exam:  General: no  acute distress,.  Currently comfortable on 3.5 mg morphine per hour HEENT:  Pupils not examined, mm dry, positive  jvd, no accessory muscle use Chest:  Decreased with course bilateral crackles , no wheezing CVS: regular, rate and rhythm S1, S2 Abdomen:* obese , not tender, not distended Ext: warm, 2 +edema bilaterally lower extremities, no mottling at this time Neuro: obtunded, comfortable  Labs: CBC    Component Value Date/Time   WBC 15.0* 08/14/2012 0515   RBC 2.73* 08/14/2012 0515   HGB 8.0* 08/14/2012 0515   HCT 25.0* 08/14/2012 0515   PLT 215 08/14/2012 0515   MCV 91.6 08/14/2012 0515   MCH 29.3 08/14/2012 0515   MCHC 32.0 08/14/2012 0515   RDW 15.3 08/14/2012 0515   LYMPHSABS 0.4* 08/13/2012 0505   MONOABS 1.3* 08/13/2012 0505   EOSABS 0.0 08/13/2012 0505   BASOSABS 0.0 08/13/2012 0505      CMP     Component Value Date/Time   NA 141 08/13/2012 0505   K 3.6 08/13/2012 0505   CL 100 08/13/2012 0505   CO2 36* 08/13/2012 0505   GLUCOSE 110* 08/13/2012 0505   GLUCOSE 91 07/20/2006 0738   BUN 29* 08/13/2012 0505   CREATININE 1.25 08/13/2012 0505   CALCIUM 8.2* 08/13/2012 0505   PROT 5.1* 08/24/2012 1732   ALBUMIN 1.9* 08/15/2012 1732   AST 19 08/04/2012 1732   ALT 18 08/15/2012 1732   ALKPHOS 92 08/24/2012 1732   BILITOT 0.4 08/06/2012 1732   GFRNONAA 53* 08/13/2012 0505   GFRAA 62* 08/13/2012 0505    Chest Xray Reviewed/Impressions:Continued bilateral airspace disease and effusions, not  significantly changed  Discussed with daughter and granddaughter,  and RN caring for patient  Time In Time Out Total Time Spent with Patient Total Overall Time  250 pm 330 pm 40 min 40 min    Greater than 50%  of this time was spent counseling and coordinating care related to the above assessment and plan.  Gwen Edler L. Ladona Ridgel, MD MBA The Palliative Medicine Team at [facility] Phone: [phone] Pager: [phone]

## 2012-08-15 NOTE — Consult Note (Signed)
WOC consult: Spoke with bedside nurse and patient's daughter, he is full comfort care now. Will make sure orders for soft silicone foam dressing for the buttock Stage II Pressure ulcer. He is on air mattress as well. Will not consult otherwise. Gamble Enderle Dinwiddie RN,CWOCN [phone]

## 2012-08-16 ENCOUNTER — Ambulatory Visit: Payer: Medicare Other | Admitting: Pulmonary Disease

## 2012-08-16 ENCOUNTER — Inpatient Hospital Stay (HOSPITAL_COMMUNITY): Payer: Medicare Other

## 2012-08-16 DIAGNOSIS — R0609 Other forms of dyspnea: Secondary | ICD-10-CM

## 2012-08-16 DIAGNOSIS — Z515 Encounter for palliative care: Secondary | ICD-10-CM

## 2012-08-16 DIAGNOSIS — R0989 Other specified symptoms and signs involving the circulatory and respiratory systems: Secondary | ICD-10-CM

## 2012-08-16 LAB — PROTEIN ELECTROPHORESIS, SERUM
Alpha-1-Globulin: 7.9 % — ABNORMAL HIGH (ref 2.9–4.9)
Alpha-2-Globulin: 15.3 % — ABNORMAL HIGH (ref 7.1–11.8)
Beta 2: 5.1 % (ref 3.2–6.5)
Gamma Globulin: 20.1 % — ABNORMAL HIGH (ref 11.1–18.8)

## 2012-08-17 NOTE — Discharge Summary (Signed)
DEATH SUMMARY  DATE OF ADMISSION:  2012-08-25  DATE OF DISCHARGE/DEATH:  29-Aug-2012   ADMISSION DIAGNOSES:    Principal Problem:  *Acute on Chronic right-sided heart failure  Active Problems:  Pleural effusion  Atrial fibrillation  Chronic respiratory failure  COPD (chronic obstructive pulmonary disease)  CAD (coronary artery disease)  RBBB  Pulmonary hypertension  GERD (gastroesophageal reflux disease)  HCAP (healthcare-associated pneumonia)  Leukocytosis  Syncope  Anasarca    DISCHARGE DIAGNOSES:    Principal Problem:  *Acute on Chronic right-sided heart failure  Active Problems:  Pleural effusion  Atrial fibrillation  Chronic respiratory failure  COPD (chronic obstructive pulmonary disease)  CAD (coronary artery disease)  RBBB  Pulmonary hypertension  GERD (gastroesophageal reflux disease)  HCAP (healthcare-associated pneumonia)  Leukocytosis  Syncope  Anasarca    PRESENTATION:   79 year old white male with PMH of Pulmonary HTN, COPD, RHF, and Afib recently discharged from [facility] on 08/09/12 for necrotizing MRSA PNA and L pleural effusion and empyema s/p VATS with decortication 07/21/12 presenting on 08/25/12 from NH for worsening shortness of breath and edema. Mr. Morrical states around 10am this morning he started having difficulty breathing with increased effort throughout the day. Labs drawn at [facility] showed leukocytosis and Tmax 99.47F. EMS was called and he was noted to have brief episode of unresponsiveness and seizure like activity when loaded on stretcher. He was bagged and HR dropped to 30s, paced briefly with return of HR back up to 50-100bpm. He also notes having a chronic productive cough with gray-brown sputum and worsening lower extremity edema.    HOSPITAL COURSE:    LINES / TUBES:  CULTURES:  1/10 Blood x2>>>  1/10 Sputum>>>   ANTIBIOTICS:  Vancomycin 08/25/22>>>  Cefepime 1/10>>>  Levofloxacin August 25, 2022>>>   SIGNIFICANT EVENTS:  1/10 Admitted to SDU for acute on  chronic respiratory failure  1/11 Echocardiogram: LVEF 70-75%. Moderate to severe AS, Moderate MS, mild MR  1/12 Brief CPR for unresponsiveness. Not intubated. Subsequently made NCB. MSO4 gtt initiated for dyspnea  1/13 Family conference. Agree to focus predominantly on comfort but continue diuresis, abx etc 1/13 Palliative care consult. Cards consult (courtesy notification as pt had been longstanding pt of Dr Myrtis Ser) 2022/08/29 Family expressed desire only for comfort care and stop all other therapies. 08-29-22 Passed away peacefully in afternoon. No autopsy was performed     Billy Fischer, MD;  PCCM service; Mobile [phone]

## 2012-08-18 LAB — FUNGUS CULTURE W SMEAR
Fungal Smear: NONE SEEN
Fungal Smear: NONE SEEN
Fungal Smear: NONE SEEN

## 2012-08-19 LAB — CULTURE, BLOOD (ROUTINE X 2)
Culture: NO GROWTH
Culture: NO GROWTH

## 2012-08-23 ENCOUNTER — Encounter: Payer: Medicare Other | Admitting: Thoracic Surgery (Cardiothoracic Vascular Surgery)

## 2012-09-02 LAB — AFB CULTURE WITH SMEAR (NOT AT ARMC)

## 2012-09-03 NOTE — Progress Notes (Signed)
Pt expired peacefully with family at bedside around 1408.

## 2012-09-03 NOTE — Progress Notes (Signed)
Went to see pt and family explained which med's I would be giving Christian [name],  The daughter  York Spaniel that he was resting peacefully and dint want him disturbed.  Med's were held per daughters wishes emotional care given to Christian [name] and family members, pt still conversive @ times and denies any needs will continue to monitor.

## 2012-09-03 NOTE — Progress Notes (Signed)
Daughter sandra asked that morning labs and Chest xray, as well as ted hose not be done.

## 2012-09-03 NOTE — Progress Notes (Signed)
Patient Christian [name]:BJYNWGNF LIANDRO THELIN      DOB: 05/30/1932      AOZ:[id number]  Arrived to meet with family at 36 am.  They were engaged with Dr. Sung Amabile.  Reviewed meeting with Dr. Sung Amabile when he was done.  Patient is near death and they are agreeable to full comfort care per their conversation with Dr. Sung Amabile.  I followed up with family to offer emotional support.  No new needs.     Pete Merten L. Ladona Ridgel, MD MBA The Palliative Medicine Team at [facility] Phone: [phone] Pager: [phone]

## 2012-09-03 NOTE — Progress Notes (Signed)
30 mins spent with family. They are all in agreement that they desire only comfort care @ this point. All other therapies discontinued.

## 2012-09-03 DEATH — deceased

## 2012-09-05 LAB — AFB CULTURE WITH SMEAR (NOT AT ARMC)

## 2014-10-28 IMAGING — CT CT CHEST W/O CM
2 of 4 series · 15 of 36 positions shown, 18 images · non-contrast
Comparison: Chest x-ray dated 07/01/2012.

CLINICAL DATA: Abnormal chest x-ray with multifocal airspace
disease and hemoptysis.

CT CHEST WITHOUT CONTRAST
TECHNIQUE: Multidetector CT imaging of the chest was performed
following the standard protocol without IV contrast.

[Series 2: chest w/o st · axial · non-contrast · 0.83mm/px · z∈[-548,-272]mm · 12 of 67 slices shown, 15 images]
[im 6/67  mediastinal]
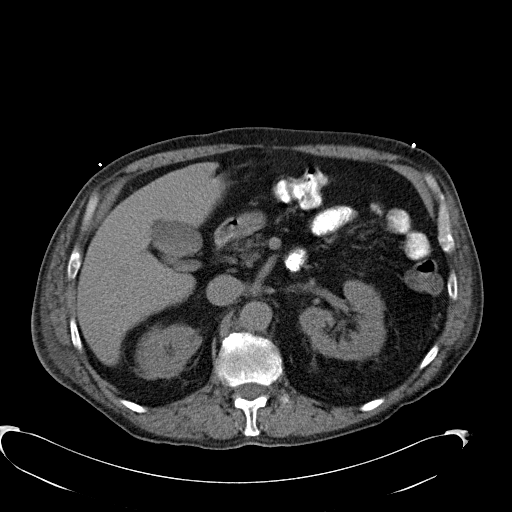
[im 6/67  lung]
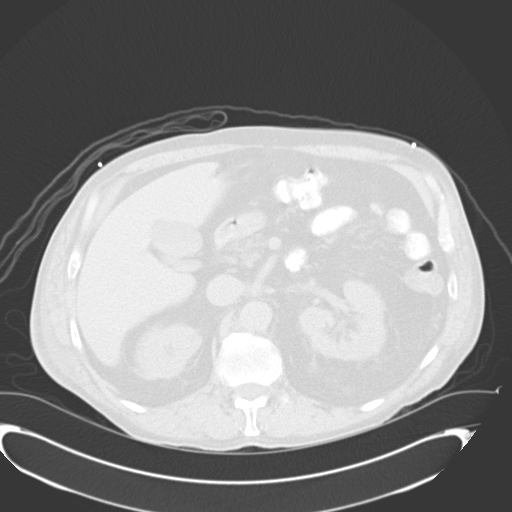
[im 11/67  lung]
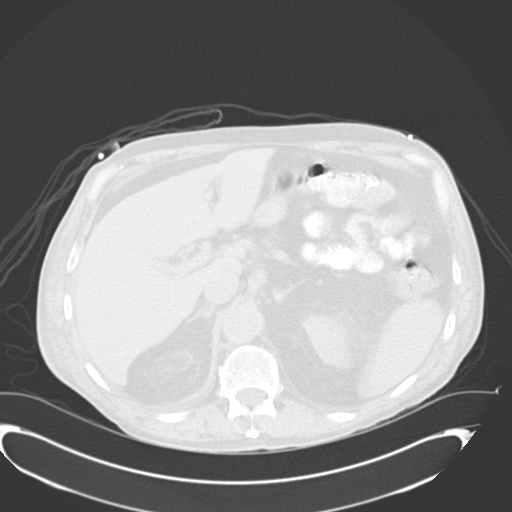
[im 16/67  lung]
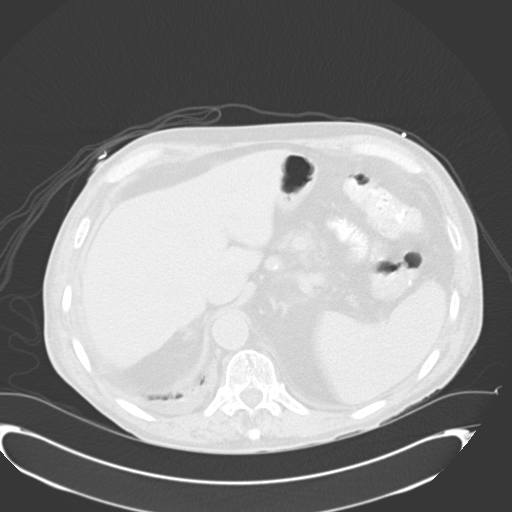
[im 21/67  lung]
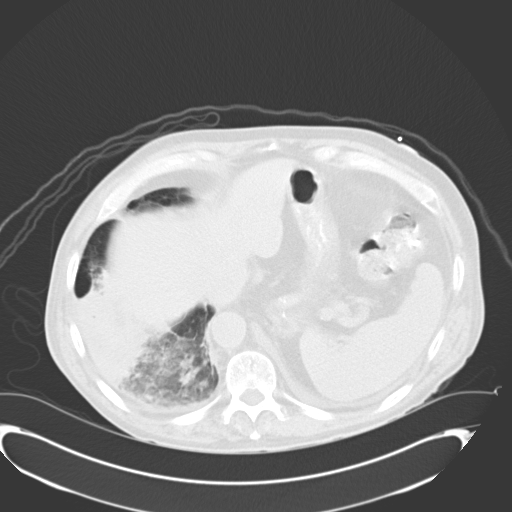
[im 26/67  mediastinal]
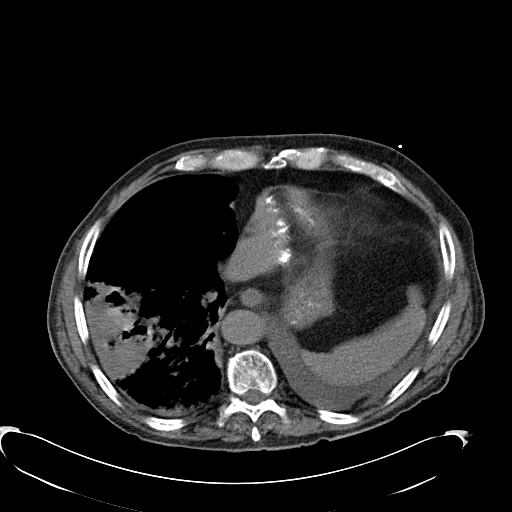
[im 26/67  lung]
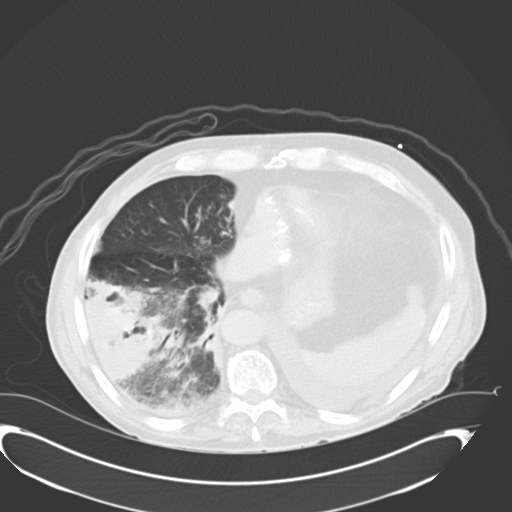
[im 31/67  lung]
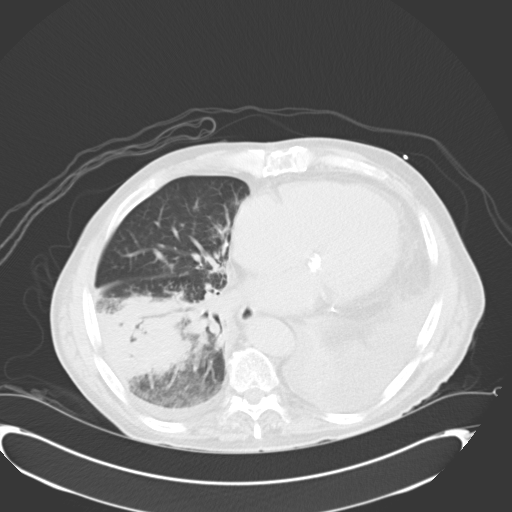
[im 36/67  lung]
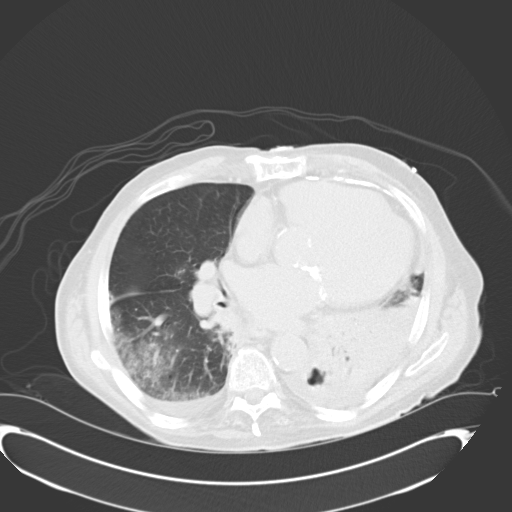
[im 41/67  lung]
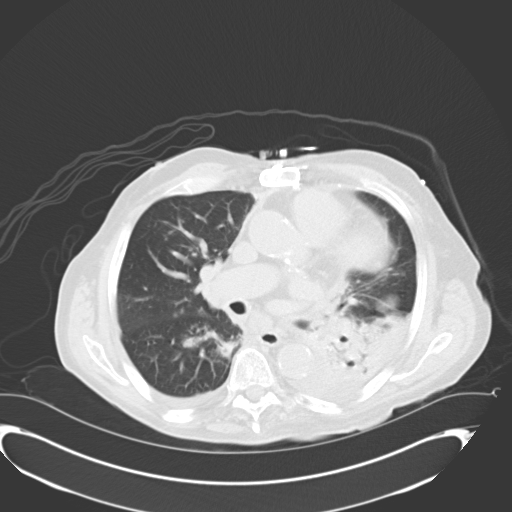
[im 46/67  mediastinal]
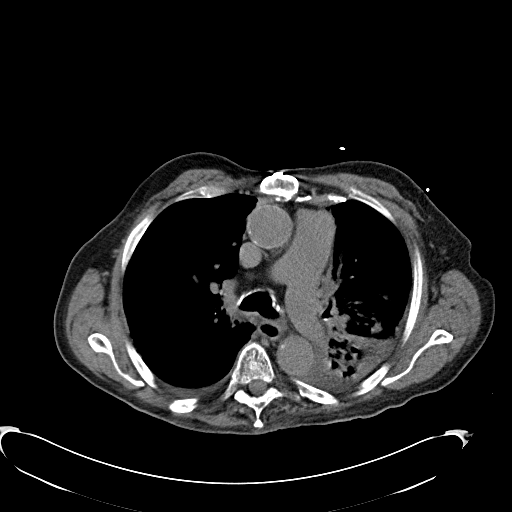
[im 46/67  lung]
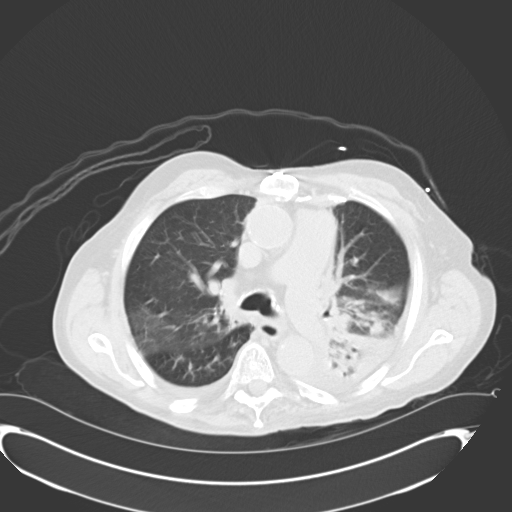
[im 51/67  lung]
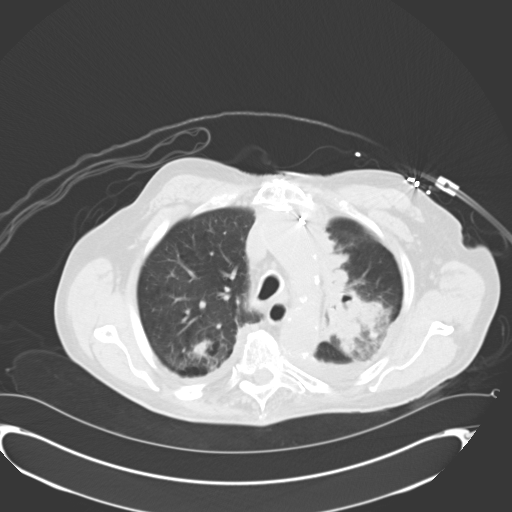
[im 56/67  lung]
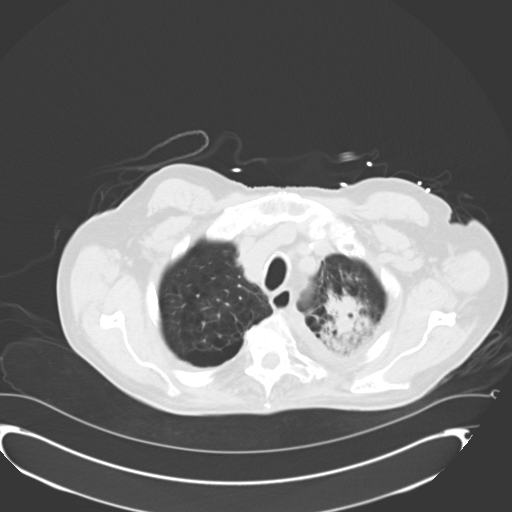
[im 61/67  lung]
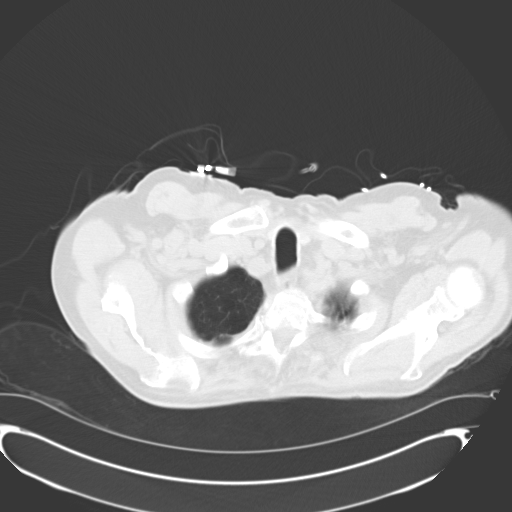

[Series 602: coronal · coronal · 0.83mm/px · 3 of 75 slices shown]
[im 15/75  lung]
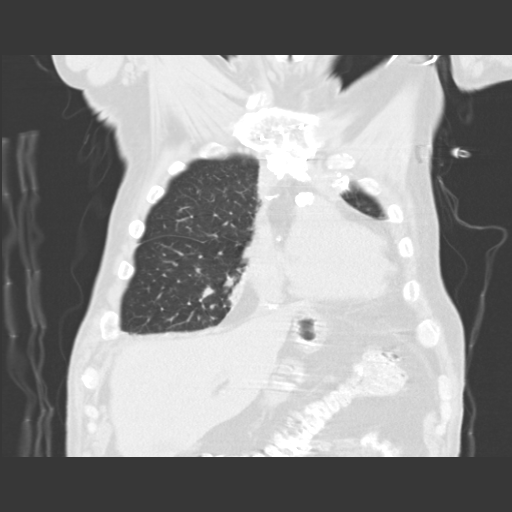
[im 30/75  lung]
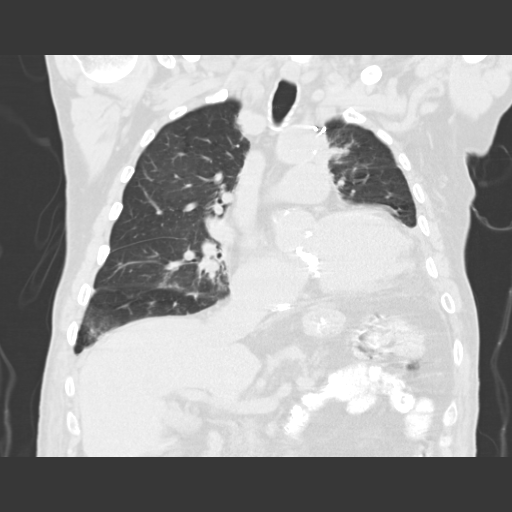
[im 45/75  lung]
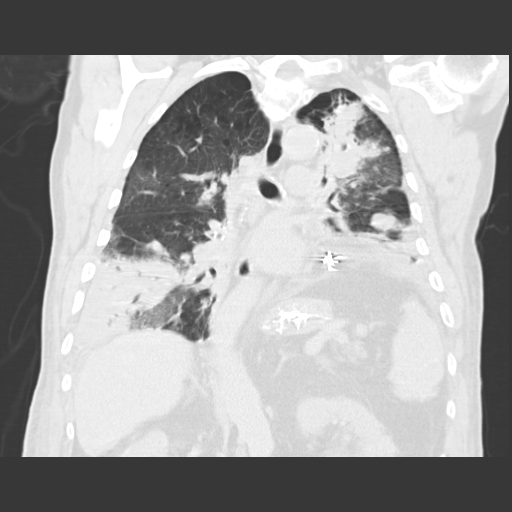

[15 of 36 positions shown; findings below may reference images not displayed]

FINDINGS: There is extensive bilateral pneumonia.  In the left
lung, extensive consolidation is seen in the upper lobe and lower
lobe with areas of cavitation and necrosis present.  The left lower
lobe bronchus is occluded by material. There are also multiple
nodules identified in the lower lobe and upper lobe adjacent areas
of consolidation.  Some of these nodules are partially cavitary.

In the right lung, extensive consolidation is identified in the
inferior lower lobe with multiple areas of cavitation and necrosis
within densely consolidated lung.  Air bronchograms are also
present in consolidated lung. A nodule is present in the posterior
right upper lobe and patchy infiltrate is also present in the
superior segment of the lower lobe.

A small amount of pleural fluid is present bilaterally, left
greater than right.  The heart is enlarged and extensive
calcifications are seen involving the aortic valve and mitral valve
annulus.  No bony abnormalities.
IMPRESSION: Extensive bilateral cavitary pneumonia with areas of necrosis.
Small associated bilateral pleural effusions.  Endobronchial
material occludes the left lower lobe bronchus.  If an organism is
not isolated by sputum or blood, consider bronchoscopic evaluation.
